# Patient Record
Sex: Female | Born: 1935 | Race: White | Hispanic: No | Smoking: Never smoker
Health system: Southern US, Community
[De-identification: ages and names within clinical notes are randomized; demographics above are authoritative.]

## PROBLEM LIST (undated history)

## (undated) DIAGNOSIS — N179 Acute kidney failure, unspecified: Secondary | ICD-10-CM

## (undated) DIAGNOSIS — K649 Unspecified hemorrhoids: Secondary | ICD-10-CM

## (undated) DIAGNOSIS — N951 Menopausal and female climacteric states: Secondary | ICD-10-CM

## (undated) DIAGNOSIS — H3530 Unspecified macular degeneration: Secondary | ICD-10-CM

## (undated) DIAGNOSIS — E039 Hypothyroidism, unspecified: Secondary | ICD-10-CM

## (undated) DIAGNOSIS — I509 Heart failure, unspecified: Secondary | ICD-10-CM

## (undated) DIAGNOSIS — H35039 Hypertensive retinopathy, unspecified eye: Secondary | ICD-10-CM

## (undated) DIAGNOSIS — Z86010 Personal history of colonic polyps: Secondary | ICD-10-CM

## (undated) DIAGNOSIS — I10 Essential (primary) hypertension: Secondary | ICD-10-CM

## (undated) DIAGNOSIS — I4891 Unspecified atrial fibrillation: Secondary | ICD-10-CM

## (undated) DIAGNOSIS — Z9189 Other specified personal risk factors, not elsewhere classified: Secondary | ICD-10-CM

## (undated) DIAGNOSIS — I999 Unspecified disorder of circulatory system: Secondary | ICD-10-CM

## (undated) HISTORY — PX: ABDOMINAL HYSTERECTOMY: SHX81

## (undated) HISTORY — DX: Essential (primary) hypertension: I10

## (undated) HISTORY — PX: HEMORRHOID BANDING: SHX5850

## (undated) HISTORY — DX: Menopausal and female climacteric states: N95.1

## (undated) HISTORY — DX: Hypertensive retinopathy, unspecified eye: H35.039

## (undated) HISTORY — DX: Unspecified atrial fibrillation: I48.91

## (undated) HISTORY — DX: Unspecified macular degeneration: H35.30

## (undated) HISTORY — PX: TONSILLECTOMY: SHX5217

## (undated) HISTORY — PX: EYE SURGERY: SHX253

## (undated) HISTORY — DX: Unspecified hemorrhoids: K64.9

## (undated) HISTORY — DX: Other specified personal risk factors, not elsewhere classified: Z91.89

## (undated) HISTORY — DX: Unspecified disorder of circulatory system: I99.9

## (undated) HISTORY — PX: APPENDECTOMY: SHX54

## (undated) HISTORY — PX: CATARACT EXTRACTION: SUR2

## (undated) HISTORY — DX: Hypothyroidism, unspecified: E03.9

## (undated) HISTORY — PX: BREAST SURGERY: SHX581

## (undated) HISTORY — DX: Acute kidney failure, unspecified: N17.9

## (undated) HISTORY — DX: Personal history of colonic polyps: Z86.010

---

## 1985-07-14 HISTORY — PX: BREAST EXCISIONAL BIOPSY: SUR124

## 1999-05-30 ENCOUNTER — Encounter: Payer: Self-pay | Admitting: Internal Medicine

## 1999-05-30 ENCOUNTER — Encounter: Admission: RE | Admit: 1999-05-30 | Discharge: 1999-05-30 | Payer: Self-pay | Admitting: Internal Medicine

## 2000-06-02 ENCOUNTER — Encounter: Admission: RE | Admit: 2000-06-02 | Discharge: 2000-06-02 | Payer: Self-pay | Admitting: Internal Medicine

## 2000-06-02 ENCOUNTER — Encounter: Payer: Self-pay | Admitting: Internal Medicine

## 2000-06-16 ENCOUNTER — Encounter: Payer: Self-pay | Admitting: Internal Medicine

## 2000-06-16 ENCOUNTER — Encounter: Admission: RE | Admit: 2000-06-16 | Discharge: 2000-06-16 | Payer: Self-pay | Admitting: Internal Medicine

## 2001-02-01 ENCOUNTER — Encounter: Admission: RE | Admit: 2001-02-01 | Discharge: 2001-02-01 | Payer: Self-pay | Admitting: Orthopedic Surgery

## 2001-02-01 ENCOUNTER — Encounter: Payer: Self-pay | Admitting: Orthopedic Surgery

## 2001-02-26 ENCOUNTER — Encounter: Admission: RE | Admit: 2001-02-26 | Discharge: 2001-03-25 | Payer: Self-pay | Admitting: Orthopedic Surgery

## 2001-06-04 ENCOUNTER — Encounter: Payer: Self-pay | Admitting: Internal Medicine

## 2001-06-04 ENCOUNTER — Encounter: Admission: RE | Admit: 2001-06-04 | Discharge: 2001-06-04 | Payer: Self-pay | Admitting: Internal Medicine

## 2002-06-08 ENCOUNTER — Encounter: Admission: RE | Admit: 2002-06-08 | Discharge: 2002-06-08 | Payer: Self-pay | Admitting: Internal Medicine

## 2002-06-08 ENCOUNTER — Encounter: Payer: Self-pay | Admitting: Internal Medicine

## 2003-06-12 ENCOUNTER — Encounter: Admission: RE | Admit: 2003-06-12 | Discharge: 2003-06-12 | Payer: Self-pay | Admitting: Internal Medicine

## 2003-06-15 ENCOUNTER — Encounter: Admission: RE | Admit: 2003-06-15 | Discharge: 2003-06-15 | Payer: Self-pay | Admitting: Internal Medicine

## 2004-05-28 ENCOUNTER — Ambulatory Visit: Payer: Self-pay | Admitting: Internal Medicine

## 2004-06-18 ENCOUNTER — Encounter: Admission: RE | Admit: 2004-06-18 | Discharge: 2004-06-18 | Payer: Self-pay | Admitting: Internal Medicine

## 2004-06-26 ENCOUNTER — Encounter: Admission: RE | Admit: 2004-06-26 | Discharge: 2004-06-26 | Payer: Self-pay | Admitting: Internal Medicine

## 2004-07-23 ENCOUNTER — Ambulatory Visit: Payer: Self-pay | Admitting: Internal Medicine

## 2004-11-20 ENCOUNTER — Ambulatory Visit: Payer: Self-pay | Admitting: Internal Medicine

## 2004-12-15 ENCOUNTER — Emergency Department (HOSPITAL_COMMUNITY): Admission: EM | Admit: 2004-12-15 | Discharge: 2004-12-15 | Payer: Self-pay | Admitting: Emergency Medicine

## 2005-04-01 ENCOUNTER — Ambulatory Visit: Payer: Self-pay | Admitting: Internal Medicine

## 2005-05-21 ENCOUNTER — Ambulatory Visit: Payer: Self-pay | Admitting: Internal Medicine

## 2005-07-11 ENCOUNTER — Encounter: Admission: RE | Admit: 2005-07-11 | Discharge: 2005-07-11 | Payer: Self-pay | Admitting: Internal Medicine

## 2005-09-29 ENCOUNTER — Encounter: Admission: RE | Admit: 2005-09-29 | Discharge: 2005-09-29 | Payer: Self-pay | Admitting: Orthopedic Surgery

## 2005-12-09 ENCOUNTER — Ambulatory Visit: Payer: Self-pay | Admitting: Internal Medicine

## 2006-04-09 ENCOUNTER — Ambulatory Visit: Payer: Self-pay | Admitting: Internal Medicine

## 2006-05-14 ENCOUNTER — Emergency Department (HOSPITAL_COMMUNITY): Admission: EM | Admit: 2006-05-14 | Discharge: 2006-05-14 | Payer: Self-pay | Admitting: Emergency Medicine

## 2006-08-24 ENCOUNTER — Ambulatory Visit: Payer: Self-pay | Admitting: Internal Medicine

## 2006-08-27 ENCOUNTER — Encounter: Admission: RE | Admit: 2006-08-27 | Discharge: 2006-08-27 | Payer: Self-pay | Admitting: Internal Medicine

## 2006-10-05 ENCOUNTER — Ambulatory Visit: Payer: Self-pay | Admitting: Internal Medicine

## 2006-11-27 ENCOUNTER — Encounter: Payer: Self-pay | Admitting: Internal Medicine

## 2006-11-27 DIAGNOSIS — Z860100 Personal history of colon polyps, unspecified: Secondary | ICD-10-CM

## 2006-11-27 DIAGNOSIS — I10 Essential (primary) hypertension: Secondary | ICD-10-CM | POA: Insufficient documentation

## 2006-11-27 DIAGNOSIS — N951 Menopausal and female climacteric states: Secondary | ICD-10-CM | POA: Insufficient documentation

## 2006-11-27 DIAGNOSIS — E039 Hypothyroidism, unspecified: Secondary | ICD-10-CM

## 2006-11-27 DIAGNOSIS — Z9189 Other specified personal risk factors, not elsewhere classified: Secondary | ICD-10-CM | POA: Insufficient documentation

## 2006-11-27 HISTORY — DX: Personal history of colonic polyps: Z86.010

## 2006-11-27 HISTORY — DX: Personal history of colon polyps, unspecified: Z86.0100

## 2006-11-27 HISTORY — DX: Hypothyroidism, unspecified: E03.9

## 2006-11-27 HISTORY — DX: Menopausal and female climacteric states: N95.1

## 2006-11-27 HISTORY — DX: Other specified personal risk factors, not elsewhere classified: Z91.89

## 2006-11-27 HISTORY — DX: Essential (primary) hypertension: I10

## 2007-01-05 ENCOUNTER — Ambulatory Visit: Payer: Self-pay | Admitting: Internal Medicine

## 2007-03-18 ENCOUNTER — Telehealth: Payer: Self-pay | Admitting: Internal Medicine

## 2007-04-14 ENCOUNTER — Ambulatory Visit: Payer: Self-pay | Admitting: Internal Medicine

## 2007-04-21 ENCOUNTER — Telehealth: Payer: Self-pay | Admitting: Internal Medicine

## 2007-08-16 ENCOUNTER — Ambulatory Visit: Payer: Self-pay | Admitting: Internal Medicine

## 2007-08-30 ENCOUNTER — Encounter: Admission: RE | Admit: 2007-08-30 | Discharge: 2007-08-30 | Payer: Self-pay | Admitting: Internal Medicine

## 2007-09-07 ENCOUNTER — Encounter: Admission: RE | Admit: 2007-09-07 | Discharge: 2007-09-07 | Payer: Self-pay | Admitting: Internal Medicine

## 2007-12-14 ENCOUNTER — Ambulatory Visit: Payer: Self-pay | Admitting: Internal Medicine

## 2007-12-24 ENCOUNTER — Ambulatory Visit: Payer: Self-pay | Admitting: Vascular Surgery

## 2008-04-11 ENCOUNTER — Emergency Department (HOSPITAL_COMMUNITY): Admission: EM | Admit: 2008-04-11 | Discharge: 2008-04-11 | Payer: Self-pay | Admitting: Emergency Medicine

## 2008-04-20 ENCOUNTER — Ambulatory Visit: Payer: Self-pay | Admitting: Internal Medicine

## 2008-04-20 DIAGNOSIS — R071 Chest pain on breathing: Secondary | ICD-10-CM | POA: Insufficient documentation

## 2008-05-31 ENCOUNTER — Ambulatory Visit: Payer: Self-pay | Admitting: Internal Medicine

## 2008-07-17 ENCOUNTER — Ambulatory Visit: Payer: Self-pay | Admitting: Internal Medicine

## 2008-07-17 DIAGNOSIS — J069 Acute upper respiratory infection, unspecified: Secondary | ICD-10-CM | POA: Insufficient documentation

## 2008-07-25 ENCOUNTER — Ambulatory Visit: Payer: Self-pay | Admitting: Internal Medicine

## 2008-08-31 ENCOUNTER — Encounter: Admission: RE | Admit: 2008-08-31 | Discharge: 2008-08-31 | Payer: Self-pay | Admitting: Internal Medicine

## 2008-11-28 ENCOUNTER — Ambulatory Visit: Payer: Self-pay | Admitting: Internal Medicine

## 2009-06-04 ENCOUNTER — Ambulatory Visit: Payer: Self-pay | Admitting: Internal Medicine

## 2009-07-26 ENCOUNTER — Ambulatory Visit: Payer: Self-pay | Admitting: Internal Medicine

## 2009-08-06 ENCOUNTER — Ambulatory Visit: Payer: Self-pay | Admitting: Internal Medicine

## 2009-08-27 LAB — HM MAMMOGRAPHY: HM Mammogram: NEGATIVE

## 2009-09-06 ENCOUNTER — Encounter: Admission: RE | Admit: 2009-09-06 | Discharge: 2009-09-06 | Payer: Self-pay | Admitting: Internal Medicine

## 2009-10-22 ENCOUNTER — Encounter (INDEPENDENT_AMBULATORY_CARE_PROVIDER_SITE_OTHER): Payer: Self-pay | Admitting: *Deleted
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
## 2009-12-03 ENCOUNTER — Ambulatory Visit: Payer: Self-pay | Admitting: Internal Medicine

## 2009-12-03 ENCOUNTER — Telehealth: Payer: Self-pay | Admitting: Internal Medicine

## 2009-12-06 ENCOUNTER — Telehealth: Payer: Self-pay

## 2009-12-06 ENCOUNTER — Encounter: Payer: Self-pay | Admitting: Internal Medicine

## 2010-01-17 ENCOUNTER — Telehealth: Payer: Self-pay | Admitting: Internal Medicine

## 2010-01-17 DIAGNOSIS — K649 Unspecified hemorrhoids: Secondary | ICD-10-CM

## 2010-01-17 DIAGNOSIS — K648 Other hemorrhoids: Secondary | ICD-10-CM | POA: Insufficient documentation

## 2010-01-17 HISTORY — DX: Unspecified hemorrhoids: K64.9

## 2010-05-24 ENCOUNTER — Ambulatory Visit: Payer: Self-pay | Admitting: Family Medicine

## 2010-05-24 DIAGNOSIS — J019 Acute sinusitis, unspecified: Secondary | ICD-10-CM | POA: Insufficient documentation

## 2010-06-12 ENCOUNTER — Ambulatory Visit: Payer: Self-pay | Admitting: Internal Medicine

## 2010-06-12 ENCOUNTER — Encounter: Payer: Self-pay | Admitting: Internal Medicine

## 2010-06-12 LAB — CONVERTED CEMR LAB
Alkaline Phosphatase: 71 units/L (ref 39–117)
BUN: 25 mg/dL — ABNORMAL HIGH (ref 6–23)
Basophils Absolute: 0 10*3/uL (ref 0.0–0.1)
Basophils Relative: 0.7 % (ref 0.0–3.0)
Calcium: 11 mg/dL — ABNORMAL HIGH (ref 8.4–10.5)
Cholesterol: 212 mg/dL — ABNORMAL HIGH (ref 0–200)
Creatinine, Ser: 0.8 mg/dL (ref 0.4–1.2)
Direct LDL: 94.6 mg/dL
Glucose, Bld: 91 mg/dL (ref 70–99)
HCT: 39.8 % (ref 36.0–46.0)
Hemoglobin: 13.7 g/dL (ref 12.0–15.0)
Lymphs Abs: 1.2 10*3/uL (ref 0.7–4.0)
MCHC: 34.5 g/dL (ref 30.0–36.0)
MCV: 94.2 fL (ref 78.0–100.0)
Monocytes Absolute: 0.4 10*3/uL (ref 0.1–1.0)
Platelets: 157 10*3/uL (ref 150.0–400.0)
TSH: 1.84 microintl units/mL (ref 0.35–5.50)
Triglycerides: 67 mg/dL (ref 0.0–149.0)
WBC: 3.9 10*3/uL — ABNORMAL LOW (ref 4.5–10.5)

## 2010-06-13 ENCOUNTER — Telehealth: Payer: Self-pay | Admitting: Internal Medicine

## 2010-07-03 ENCOUNTER — Ambulatory Visit: Payer: Self-pay | Admitting: Internal Medicine

## 2010-07-23 ENCOUNTER — Telehealth: Payer: Self-pay | Admitting: Internal Medicine

## 2010-08-03 ENCOUNTER — Encounter: Payer: Self-pay | Admitting: Internal Medicine

## 2010-08-08 ENCOUNTER — Other Ambulatory Visit: Payer: Self-pay | Admitting: Internal Medicine

## 2010-08-08 DIAGNOSIS — Z1239 Encounter for other screening for malignant neoplasm of breast: Secondary | ICD-10-CM

## 2010-08-11 LAB — CONVERTED CEMR LAB
AST: 14 units/L (ref 0–37)
AST: 15 units/L (ref 0–37)
AST: 15 units/L (ref 0–37)
Albumin: 4.1 g/dL (ref 3.5–5.2)
Albumin: 4.2 g/dL (ref 3.5–5.2)
Albumin: 4.4 g/dL (ref 3.5–5.2)
Alkaline Phosphatase: 72 units/L (ref 39–117)
Alkaline Phosphatase: 72 units/L (ref 39–117)
Alkaline Phosphatase: 82 units/L (ref 39–117)
BUN: 20 mg/dL (ref 6–23)
BUN: 23 mg/dL (ref 6–23)
Basophils Absolute: 0 10*3/uL (ref 0.0–0.1)
Basophils Absolute: 0 10*3/uL (ref 0.0–0.1)
Basophils Relative: 0.5 % (ref 0.0–3.0)
Bilirubin, Direct: 0.1 mg/dL (ref 0.0–0.3)
Bilirubin, Direct: 0.1 mg/dL (ref 0.0–0.3)
CO2: 30 meq/L (ref 19–32)
Calcium: 10.9 mg/dL — ABNORMAL HIGH (ref 8.4–10.5)
Calcium: 11 mg/dL — ABNORMAL HIGH (ref 8.4–10.5)
Chloride: 103 meq/L (ref 96–112)
Chloride: 109 meq/L (ref 96–112)
Cholesterol: 206 mg/dL — ABNORMAL HIGH (ref 0–200)
Cholesterol: 210 mg/dL (ref 0–200)
Creatinine, Ser: 0.7 mg/dL (ref 0.4–1.2)
Eosinophils Relative: 1.8 % (ref 0.0–5.0)
Eosinophils Relative: 2.2 % (ref 0.0–5.0)
GFR calc Af Amer: 104 mL/min
GFR calc Af Amer: 104 mL/min
GFR calc non Af Amer: 86 mL/min
Glucose, Bld: 103 mg/dL — ABNORMAL HIGH (ref 70–99)
Glucose, Bld: 95 mg/dL (ref 70–99)
HCT: 40.5 % (ref 36.0–46.0)
HCT: 41.1 % (ref 36.0–46.0)
Hemoglobin: 13.6 g/dL (ref 12.0–15.0)
Lymphocytes Relative: 27.3 % (ref 12.0–46.0)
Lymphocytes Relative: 29.3 % (ref 12.0–46.0)
MCHC: 34 g/dL (ref 30.0–36.0)
MCV: 94.6 fL (ref 78.0–100.0)
Monocytes Absolute: 0.4 10*3/uL (ref 0.2–0.7)
Monocytes Relative: 8.7 % (ref 3.0–12.0)
Monocytes Relative: 9.9 % (ref 3.0–11.0)
Neutro Abs: 2.4 10*3/uL (ref 1.4–7.7)
Neutro Abs: 2.7 10*3/uL (ref 1.4–7.7)
Neutro Abs: 2.8 10*3/uL (ref 1.4–7.7)
Neutrophils Relative %: 59.7 % (ref 43.0–77.0)
Platelets: 156 10*3/uL (ref 150–400)
Potassium: 3.2 meq/L — ABNORMAL LOW (ref 3.5–5.1)
RDW: 12.3 % (ref 11.5–14.6)
Sodium: 143 meq/L (ref 135–145)
Sodium: 146 meq/L — ABNORMAL HIGH (ref 135–145)
Total Bilirubin: 0.9 mg/dL (ref 0.3–1.2)
Total Bilirubin: 1 mg/dL (ref 0.3–1.2)
Total CHOL/HDL Ratio: 1.8
Total Protein: 6.7 g/dL (ref 6.0–8.3)
Total Protein: 6.8 g/dL (ref 6.0–8.3)
VLDL: 11 mg/dL (ref 0–40)
WBC: 4 10*3/uL — ABNORMAL LOW (ref 4.5–10.5)
WBC: 4.5 10*3/uL (ref 4.5–10.5)

## 2010-08-13 NOTE — Progress Notes (Signed)
Summary: Klor Con 20 tabs  Phone Note From Pharmacy   Caller: Pharmacy  Summary of Call: Pharmacy faxed a request to clarify Klor Con, not pack, tablet Initial call taken by: Sid Falcon LPN,  June 13, 2010 4:25 PM    New/Updated Medications: KLOR-CON M20 20 MEQ CR-TABS (POTASSIUM CHLORIDE CRYS CR) once daily

## 2010-08-13 NOTE — Assessment & Plan Note (Signed)
Summary: COUGH/CONGESTION/CJR   Vital Signs:  Patient profile:   77 year old female Height:      60.75 inches (154.31 cm) Weight:      147 pounds (66.82 kg) BMI:     28.11 O2 Sat:      98 % on Room air Temp:     98.0 degrees F (36.67 degrees C) oral Pulse rate:   80 / minute BP sitting:   122 / 68  (left arm) Cuff size:   regular  Vitals Entered By: [name] RMA (May 24, 2010 10:58 AM)  O2 Flow:  Room air CC: cough/congestion X8 days- cough w/phlegm (green-started today), chest congestion, head feels "full"/ CF Is Patient Diabetic? No   History of Present Illness: 77 y/o WF who presents complaining of upper respiratory symptoms for 8-9  days.  [place] nasal congestion/runny nose, PND cough--thick, green mucous last couple of days.  No fevers, no wheezing or SOB.  Face hurting around paranasal sinuses.  No teeth pain.  ST mild at most. Symptoms not improving any.   Preventive Screening-Counseling & Management  Alcohol-Tobacco     Smoking Status: never  Current Medications (verified): 1)  Hydrochlorothiazide 25 Mg Tabs (Hydrochlorothiazide) .... Take 1 Tablet By Mouth Once A Day Fill [facility] 2)  Synthroid 75 Mcg Tabs (Levothyroxine Sodium) .Marland Kitchen.. 1 Once Daily Fill [facility] 3)  Multivitamins   Caps (Multiple Vitamin) .... Once Daily 4)  Calcium 500 500 Mg  Tabs (Calcium Carbonate) .... Once Daily 5)  Amlodipine Besylate 10 Mg  Tabs (Amlodipine Besylate) .Marland Kitchen.. 1 Once Daily Fill [facility] 6)  Klor-Con 20 Meq Pack (Potassium Chloride) .Marland Kitchen.. 1 [facility] 7)  Zithromax Z-Pak 250 Mg Tabs (Azithromycin) .... As Directed 8)  Prednisone 20 Mg Tabs (Prednisone) .... 2 Tabs By Mouth Once Daily X 5d  Allergies (verified): 1)  Codeine Phosphate (Codeine Phosphate)  Past History:  Past medical, surgical, family and social histories (including risk factors) reviewed, and no changes noted (except as noted below).  Past Medical History: Reviewed history from 05/31/2008 and no  changes required. Colonic polyps, hx of Hypertension Hypothyroidism external hemorrhoids  Past Surgical History: Reviewed history from 05/31/2008 and no changes required. Appendectomy 1981 Hysterectomy 1981 Tonsillectomy gravida 3, para 3, abortus zero history breast biopsy colonoscopy September 2004  Review of Systems       see HPI  Physical Exam  General:  Gen: alert, NAD, NONTOXIC. HEENT: eyes without injection, drainage, or swelling.  Ears: EACs clear, TMs with normal light reflex and landmarks.  Nose: some dried, crusty exudate adherent to some mildly injected mucosa.  No purulent d/c.  TTP in ethmoid sinus areas bilaterally.  No facial swelling.  Throat and mouth without focal lesion.  No pharyngial swelling, erythema, or exudate.   Neck: supple, no LAD.  LUNGS: CTA bilat, nonlabored resps.  CV: RRR, no m/r/g.     Impression & Recommendations:  Problem # 1:  BRONCHITIS- ACUTE (ICD-466.0) Assessment New No sign of any significant RAD component. Take z-pack and steroids as prescribed.  Problem # 2:  SINUSITIS - ACUTE-NOS (ICD-461.9) Assessment: New Take z-pack as prescribed. Continue frequent nasal saline irrigation. She will return for flu vaccine when feeling better.  Complete Medication List: 1)  Hydrochlorothiazide 25 Mg Tabs (Hydrochlorothiazide) .... Take 1 tablet by mouth once a day fill [facility] 2)  Synthroid 75 Mcg Tabs (Levothyroxine sodium) .Marland Kitchen.. 1 once daily fill [facility] 3)  Multivitamins Caps (Multiple vitamin) .... Once daily 4)  Calcium 500 500 Mg Tabs (Calcium carbonate) .... Once daily 5)  Amlodipine Besylate 10 Mg Tabs (Amlodipine besylate) .Marland Kitchen.. 1 once daily fill [facility] 6)  Klor-con 20 Meq Pack (Potassium chloride) .Marland Kitchen.. 1 [facility] 7)  Zithromax Z-pak 250 Mg Tabs (Azithromycin) .... As directed 8)  Prednisone 20 Mg Tabs (Prednisone) .... 2 tabs by mouth once daily x 5d  Patient Instructions: 1)  Please schedule a follow-up appointment as  needed .  2)  Continue nasal saline irrigation and mucinex DM. 3)  Call or return if not much improved in 5d. Prescriptions: PREDNISONE 20 MG TABS (PREDNISONE) 2 tabs by mouth once daily x 5d  #10 x 0   Entered and Authorized by:   Michell Heinrich M.D.   Signed by:   Michell Heinrich M.D. on 05/24/2010   Method used:   Electronically to        [id number]       [address]       [place], [place]  [address]       Ph: [phone] or [phone]       Fax: [phone]   RxID:   [id number] ZITHROMAX Z-PAK 250 MG TABS (AZITHROMYCIN) as directed  #1 pack x 0   Entered and Authorized by:   Michell Heinrich M.D.   Signed by:   Michell Heinrich M.D. on 05/24/2010   Method used:   Electronically to        [id number]       [address]       [place], [place]  [address]       Ph: [phone] or [phone]       Fax: [phone]   RxID:   [id number]    Orders Added: 1)  Est. Patient Level III [[address]]

## 2010-08-13 NOTE — Assessment & Plan Note (Signed)
Summary: CHEST CONGESTION/THROAT IRRITATION/CJR   Vital Signs:  Patient profile:   77 year old female Weight:      149 pounds Temp:     99.0 degrees F oral BP sitting:   150 / 80  (left arm) Cuff size:   regular  Vitals Entered By: Raechel Ache, RN (August 06, 2009 3:29 PM) CC: C/o raspy throat and dry cough   CC:  C/o raspy throat and dry cough.  History of Present Illness: 77 year old patient who presents today with ongoing cough, congestion.  She was on amoxicillin about two weeks ago for a dental indication and over the past two days has had worsening cough and chest congestion.  She has felt unwell, but no clear fever or chills.  Denies any chest pain or shortness of breath.  Cough is largely nonproductive.  She also had mild sore throat.  She has treated hypertension and hypothyroidism  Allergies: 1)  Codeine Phosphate (Codeine Phosphate)  Past History:  Past Medical History: Reviewed history from 05/31/2008 and no changes required. Colonic polyps, hx of Hypertension Hypothyroidism external hemorrhoids  Review of Systems       The patient complains of anorexia and prolonged cough.  The patient denies fever, weight loss, weight gain, vision loss, decreased hearing, hoarseness, chest pain, syncope, dyspnea on exertion, peripheral edema, headaches, hemoptysis, abdominal pain, melena, hematochezia, severe indigestion/heartburn, hematuria, incontinence, genital sores, muscle weakness, suspicious skin lesions, transient blindness, difficulty walking, depression, unusual weight change, abnormal bleeding, enlarged lymph nodes, angioedema, and breast masses.    Physical Exam  General:  Well-developed,well-nourished,in no acute distress; alert,appropriate and cooperative throughout examination Head:  Normocephalic and atraumatic without obvious abnormalities. No apparent alopecia or balding. Eyes:  No corneal or conjunctival inflammation noted. EOMI. Perrla. Funduscopic exam  benign, without hemorrhages, exudates or papilledema. Vision grossly normal. Mouth:  Oral mucosa and oropharynx without lesions or exudates.  Teeth in good repair. Neck:  No deformities, masses, or tenderness noted. Lungs:  Normal respiratory effort, chest expands symmetrically. Lungs are clear to auscultation, no crackles or wheezes. O2 saturation 95% Heart:  Normal rate and regular rhythm. S1 and S2 normal without gallop, murmur, click, rub or other extra sounds. Abdomen:  Bowel sounds positive,abdomen soft and non-tender without masses, organomegaly or hernias noted. Msk:  No deformity or scoliosis noted of thoracic or lumbar spine.     Impression & Recommendations:  Problem # 1:  URI (ICD-465.9)  Orders: Prescription Created Electronically [phone])  Problem # 2:  HYPERTENSION (ICD-401.9)  Her updated medication list for this problem includes:    Hydrochlorothiazide 25 Mg Tabs (Hydrochlorothiazide) .Marland Kitchen... Take 1 tablet by mouth once a day    Amlodipine Besylate 10 Mg Tabs (Amlodipine besylate) .Marland Kitchen... 1 once daily  Her updated medication list for this problem includes:    Hydrochlorothiazide 25 Mg Tabs (Hydrochlorothiazide) .Marland Kitchen... Take 1 tablet by mouth once a day    Amlodipine Besylate 10 Mg Tabs (Amlodipine besylate) .Marland Kitchen... 1 once daily  Complete Medication List: 1)  Hydrochlorothiazide 25 Mg Tabs (Hydrochlorothiazide) .... Take 1 tablet by mouth once a day 2)  Synthroid 75 Mcg Tabs (Levothyroxine sodium) .Marland Kitchen.. 1 once daily 3)  Multivitamins Caps (Multiple vitamin) .... Once daily 4)  Calcium 500 500 Mg Tabs (Calcium carbonate) .... Once daily 5)  Amlodipine Besylate 10 Mg Tabs (Amlodipine besylate) .Marland Kitchen.. 1 once daily 6)  Klor-con 20 Meq Pack (Potassium chloride) .Marland Kitchen.. 1 once daily 7)  Doxycycline Hyclate 100 Mg Caps (Doxycycline hyclate) .... One  twice daily  Patient Instructions: 1)  Acute bronchitis symptoms for less than 10 days are not helped by antibiotics. take over the  counter cough medications. call if no improvment in  5-7 days, sooner if increasing cough, fever, or new symptoms( shortness of breath, chest pain). Prescriptions: DOXYCYCLINE HYCLATE 100 MG CAPS (DOXYCYCLINE HYCLATE) one twice daily  #14 x 0   Entered and Authorized by:   Gordy Savers  MD   Signed by:   Gordy Savers  MD on 08/06/2009   Method used:   Printed then faxed to ...       [id number]       [address]       [place], [place]  [address]       Ph: [phone] or [phone]       Fax: [phone]   RxID:   [id number] DOXYCYCLINE HYCLATE 100 MG CAPS (DOXYCYCLINE HYCLATE) one twice daily  #14 x 0   Entered and Authorized by:   Gordy Savers  MD   Signed by:   Gordy Savers  MD on 08/06/2009   Method used:   Electronically to        [id number]       [address]       [place], [place]  [address]       Ph: [phone] or [phone]       Fax: [phone]   RxID:   [id number]

## 2010-08-13 NOTE — Assessment & Plan Note (Signed)
Summary: 6 MNTH ROV//SLM   Vital Signs:  Patient profile:   77 year old female Height:      60.75 inches Weight:      149 pounds Temp:     98.2 degrees F oral BP sitting:   122 / 80  (left arm) Cuff size:   regular  Vitals Entered By: Duard Brady LPN (Dec 03, 2009 1:00 PM) CC: 6 mos rov - doing well Is Patient Diabetic? No   CC:  6 mos rov - doing well.  History of Present Illness: 77  year old patient who is seen today for follow-up.  She has been seen by general surgery for external hemorrhoids, and this is her chief complaint.  She has treated hypertension, and a history of colonic polyps.  She is contemplating a repeat and final colonoscopy.  She has hypertension and hypothyroidism  Preventive Screening-Counseling & Management  Alcohol-Tobacco     Smoking Status: never  Allergies: 1)  Codeine Phosphate (Codeine Phosphate)  Past History:  Past Medical History: Reviewed history from 05/31/2008 and no changes required. Colonic polyps, hx of Hypertension Hypothyroidism external hemorrhoids  Past Surgical History: Reviewed history from 05/31/2008 and no changes required. Appendectomy 1981 Hysterectomy 1981 Tonsillectomy gravida 3, para 3, abortus zero history breast biopsy colonoscopy September 2004  Family History: Reviewed history from 05/31/2008 and no changes required. both her parents died at 8, father from coronary artery disease,  mother from uterine cancer.    No siblings  Social History: Reviewed history from 11/28/2008 and no changes required. Married Never Smoked husband  status post CABG in 2010-history of dementia  Review of Systems       The patient complains of hematochezia.  The patient denies anorexia, fever, weight loss, weight gain, vision loss, decreased hearing, hoarseness, chest pain, syncope, dyspnea on exertion, peripheral edema, prolonged cough, headaches, hemoptysis, abdominal pain, melena, severe indigestion/heartburn,  hematuria, incontinence, genital sores, muscle weakness, suspicious skin lesions, transient blindness, difficulty walking, depression, unusual weight change, abnormal bleeding, enlarged lymph nodes, angioedema, and breast masses.    Physical Exam  General:  Well-developed,well-nourished,in no acute distress; alert,appropriate and cooperative throughout examination Head:  Normocephalic and atraumatic without obvious abnormalities. No apparent alopecia or balding. Eyes:  No corneal or conjunctival inflammation noted. EOMI. Perrla. Funduscopic exam benign, without hemorrhages, exudates or papilledema. Vision grossly normal. Mouth:  Oral mucosa and oropharynx without lesions or exudates.  Teeth in good repair. Neck:  No deformities, masses, or tenderness noted. Lungs:  Normal respiratory effort, chest expands symmetrically. Lungs are clear to auscultation, no crackles or wheezes. Heart:  Normal rate and regular rhythm. S1 and S2 normal without gallop, murmur, click, rub or other extra sounds. Abdomen:  Bowel sounds positive,abdomen soft and non-tender without masses, organomegaly or hernias noted. Msk:  No deformity or scoliosis noted of thoracic or lumbar spine.   Pulses:  R and L carotid,radial,femoral,dorsalis pedis and posterior tibial pulses are full and equal bilaterally Extremities:  No clubbing, cyanosis, edema, or deformity noted with normal full range of motion of all joints.     Impression & Recommendations:  Problem # 1:  HYPOTHYROIDISM (ICD-244.9)  Her updated medication list for this problem includes:    Synthroid 75 Mcg Tabs (Levothyroxine sodium) .Marland Kitchen... 1 once daily  Her updated medication list for this problem includes:    Synthroid 75 Mcg Tabs (Levothyroxine sodium) .Marland Kitchen... 1 once daily  Problem # 2:  HYPERTENSION (ICD-401.9)  Her updated medication list for this problem includes:  Hydrochlorothiazide 25 Mg Tabs (Hydrochlorothiazide) .Marland Kitchen... Take 1 tablet by mouth once a  day    Amlodipine Besylate 10 Mg Tabs (Amlodipine besylate) .Marland Kitchen... 1 once daily    Her updated medication list for this problem includes:    Hydrochlorothiazide 25 Mg Tabs (Hydrochlorothiazide) .Marland Kitchen... Take 1 tablet by mouth once a day    Amlodipine Besylate 10 Mg Tabs (Amlodipine besylate) .Marland Kitchen... 1 once daily  Orders: Prescription Created Electronically [phone])  Problem # 3:  COLONIC POLYPS, HX OF (ICD-V12.72) risks versus benefits of colonoscopy discussed  Complete Medication List: 1)  Hydrochlorothiazide 25 Mg Tabs (Hydrochlorothiazide) .... Take 1 tablet by mouth once a day 2)  Synthroid 75 Mcg Tabs (Levothyroxine sodium) .Marland Kitchen.. 1 once daily 3)  Multivitamins Caps (Multiple vitamin) .... Once daily 4)  Calcium 500 500 Mg Tabs (Calcium carbonate) .... Once daily 5)  Amlodipine Besylate 10 Mg Tabs (Amlodipine besylate) .Marland Kitchen.. 1 once daily 6)  Klor-con 20 Meq Pack (Potassium chloride) .Marland Kitchen.. 1 once daily  Patient Instructions: 1)  Please schedule a follow-up appointment in 6 months. 2)  Advised not to eat any food or drink any liquids after 10 PM the night before your procedure. 3)  Limit your Sodium (Salt). 4)  It is important that you exercise regularly at least 20 minutes 5 times a week. If you develop chest pain, have severe difficulty breathing, or feel very tired , stop exercising immediately and seek medical attention. 5)  Take calcium +Vitamin D daily. Prescriptions: KLOR-CON 20 MEQ PACK (POTASSIUM CHLORIDE) 1 once daily  #90 x 6   Entered and Authorized by:   Gordy Savers  MD   Signed by:   Gordy Savers  MD on 12/03/2009   Method used:   Electronically to        [id number]       [address]       [place], [place]  [address]       Ph: [phone] or [phone]       Fax: [phone]   RxID:   [id number] AMLODIPINE BESYLATE 10 MG  TABS (AMLODIPINE BESYLATE) 1 once daily  #90 x 6   Entered and Authorized by:   Gordy Savers  MD    Signed by:   Gordy Savers  MD on 12/03/2009   Method used:   Electronically to        [id number]       [address]       [place], [place]  [address]       Ph: [phone] or [phone]       Fax: [phone]   RxID:   [id number] SYNTHROID 75 MCG TABS (LEVOTHYROXINE SODIUM) 1 once daily  #90 x 6   Entered and Authorized by:   Gordy Savers  MD   Signed by:   Gordy Savers  MD on 12/03/2009   Method used:   Electronically to        [id number]       [address]       [place], [place]  [address]       Ph: [phone] or [phone]       Fax: [phone]   RxID:   [id number] HYDROCHLOROTHIAZIDE 25 MG TABS (HYDROCHLOROTHIAZIDE) Take 1 tablet by mouth once a day  #90 x 6   Entered and Authorized by:   Gordy Savers  MD   Signed by:  Gordy Savers  MD on 12/03/2009   Method used:   Electronically to        [facility]       [address]       [place], [place]  [address]       Ph: [phone] or [phone]       Fax: [phone]   RxID:   [id number]

## 2010-08-13 NOTE — Progress Notes (Signed)
Summary: rx confusion - where to fill  Phone Note Call from Patient Call back at Home Phone [phone]   Caller: Patient---live call Reason for Call: Talk to Nurse Summary of Call: wants Kim to return call about her rxs. Initial call taken by: Warnell Forester,  Dec 06, 2009 9:10 AM  Follow-up for Phone Call        having problems with rx's both at [facility] and [facility]   per pt : [facility]  all other meds to be [facility]   [facility] that [facility]  Follow-up by: Duard Brady LPN,  Dec 06, 2009 11:14 AM  Additional Follow-up for Phone Call Additional follow up Details #1::        [facility] , cx'd all rx's except Potassium  Called [facility] - spoke with Olga Coaster pharmacist - confirmed current Rx's  hctz,synthriod, amlodipine only. Pt needs rf on hctz and synthriod at this time, hold rx for amlodipine until pt request it to be filled.  Additional Follow-up by: Duard Brady LPN,  Dec 06, 2009 11:47 AM    Additional Follow-up for Phone Call Additional follow up Details #2::    spoke with pt - explained who I spoke with and that rx's should be ok at this time. KIK Follow-up by: Duard Brady LPN,  Dec 06, 2009 11:48 AM

## 2010-08-13 NOTE — Progress Notes (Signed)
Summary: hemorrhoids  Phone Note Call from Patient   Caller: Patient Call For: Gordy Savers  MD Reason for Call: Talk to Doctor Summary of Call: rec'd call  from pt  - wanted to speak to Dr. Amador Cunas about her hemorrhoids  that they discussed at last visit. KIK Initial call taken by: Duard Brady LPN,  January 17, 1609 3:31 PM  Follow-up for Phone Call        attempt to call - ans mach - LMTCB - informed Dr. Amador Cunas out of office until Monday - call and let me know what the problem is in case i can help. I reviewed last office visit - cant really figure out what may be the problem. KIK Follow-up by: Duard Brady LPN,  January 18, 9603 3:36 PM  Additional Follow-up for Phone Call Additional follow up Details #1::        OK to refer back to Gen Surgery for hemorrhoids Additional Follow-up by: Gordy Savers  MD,  January 20, 2010 8:16 PM  New Problems: HEMORRHOIDS (ICD-455.6)   Additional Follow-up for Phone Call Additional follow up Details #2::    pt aware ok for referal to gen. surg , info sent to terri. Terri will call when set up with date and time. KIK Follow-up by: Duard Brady LPN,  January 21, 2010 12:00 PM  New Problems: HEMORRHOIDS (ICD-455.6)

## 2010-08-13 NOTE — Letter (Signed)
Summary: Referral - not able to see patient  [facility]  [address] [place], [place] [address]   Phone: [phone]  Fax: [phone]    October 22, 2009   Eleonore Chiquito, M.D. [address] [place], [place] [address]    Re:   ASTER SCREWS DOB:  1927/07/24 MRN:   [id number]    Dear Dr. Amador Cunas:  Thank you for your kind referral of the above patient.  We have attempted to schedule the recommended procedure Screening Colonoscopy but have not been able to schedule because:   X  The patient was not available by phone and/or has not returned our calls.  ___ The patient declined to schedule the procedure at this time.  We appreciate the referral and hope that we will have the opportunity to treat this patient in the future.    Sincerely,    [facility] [phone]

## 2010-08-13 NOTE — Assessment & Plan Note (Signed)
Summary: CONGESTION // RS   Vital Signs:  Patient profile:   77 year old female Weight:      150 pounds Temp:     98.3 degrees F oral BP sitting:   122 / 62  (left arm) Cuff size:   regular  Vitals Entered By: Raechel Ache, RN (July 26, 2009 12:52 PM) CC: C/o cough and some sinus drainage, bloody mucus.   CC:  C/o cough and some sinus drainage and bloody mucus..  History of Present Illness: 77 year old patient who was seen earlier by her dentist for a tooth abscess.  She has some significant sinus congestion and drainage earlier, but this has largely resolved.  Present time.  She has a minimal nonproductive cough.  No fever.  In general, she feels improved. she has treated hypertension, which has been stable.  Allergies: 1)  Codeine Phosphate (Codeine Phosphate)  Past History:  Past Medical History: Reviewed history from 05/31/2008 and no changes required. Colonic polyps, hx of Hypertension Hypothyroidism external hemorrhoids  Physical Exam  General:  Well-developed,well-nourished,in no acute distress; alert,appropriate and cooperative throughout examination Head:  Normocephalic and atraumatic without obvious abnormalities. No apparent alopecia or balding. Ears:  External ear exam shows no significant lesions or deformities.  Otoscopic examination reveals clear canals, tympanic membranes are intact bilaterally without bulging, retraction, inflammation or discharge. Hearing is grossly normal bilaterally. Mouth:  Oral mucosa and oropharynx without lesions or exudates.  Teeth in good repair. Neck:  No deformities, masses, or tenderness noted. Lungs:  Normal respiratory effort, chest expands symmetrically. Lungs are clear to auscultation, no crackles or wheezes. Heart:  Normal rate and regular rhythm. S1 and S2 normal without gallop, murmur, click, rub or other extra sounds. Abdomen:  Bowel sounds positive,abdomen soft and non-tender without masses, organomegaly or hernias  noted. Msk:  No deformity or scoliosis noted of thoracic or lumbar spine.   Pulses:  R and L carotid,radial,femoral,dorsalis pedis and posterior tibial pulses are full and equal bilaterally Extremities:  No clubbing, cyanosis, edema, or deformity noted with normal full range of motion of all joints.     Impression & Recommendations:  Problem # 1:  URI (ICD-465.9)  Problem # 2:  HYPERTENSION (ICD-401.9)  Her updated medication list for this problem includes:    Hydrochlorothiazide 25 Mg Tabs (Hydrochlorothiazide) .Marland Kitchen... Take 1 tablet by mouth once a day    Amlodipine Besylate 10 Mg Tabs (Amlodipine besylate) .Marland Kitchen... 1 once daily  Complete Medication List: 1)  Hydrochlorothiazide 25 Mg Tabs (Hydrochlorothiazide) .... Take 1 tablet by mouth once a day 2)  Synthroid 75 Mcg Tabs (Levothyroxine sodium) .Marland Kitchen.. 1 once daily 3)  Multivitamins Caps (Multiple vitamin) .... Once daily 4)  Calcium 500 500 Mg Tabs (Calcium carbonate) .... Once daily 5)  Amlodipine Besylate 10 Mg Tabs (Amlodipine besylate) .Marland Kitchen.. 1 once daily 6)  Klor-con 20 Meq Pack (Potassium chloride) .Marland Kitchen.. 1 once daily  Patient Instructions: 1)  Get plenty of rest, drink lots of clear liquids, and use Tylenol or Ibuprofen for fever and comfort. Return in 7-10 days if you're not better:sooner if you're feeling worse.

## 2010-08-13 NOTE — Miscellaneous (Signed)
Summary: change to med list  Clinical Lists Changes  Medications: Changed medication from KLOR-CON 20 MEQ PACK (POTASSIUM CHLORIDE) 1 once daily to KLOR-CON 20 MEQ PACK (POTASSIUM CHLORIDE) 1 [facility] Changed medication from HYDROCHLOROTHIAZIDE 25 MG TABS (HYDROCHLOROTHIAZIDE) Take 1 tablet by mouth once a day to HYDROCHLOROTHIAZIDE 25 MG TABS (HYDROCHLOROTHIAZIDE) Take 1 tablet by mouth once a day fill [facility] Changed medication from SYNTHROID 75 MCG TABS (LEVOTHYROXINE SODIUM) 1 once daily to SYNTHROID 75 MCG TABS (LEVOTHYROXINE SODIUM) 1 once daily fill [facility] Changed medication from AMLODIPINE BESYLATE 10 MG  TABS (AMLODIPINE BESYLATE) 1 once daily to AMLODIPINE BESYLATE 10 MG  TABS (AMLODIPINE BESYLATE) 1 once daily fill [facility]    changes made to assist pt in having meds fill with the right pharmacy. phone note from 12/06/09   [facility]

## 2010-08-13 NOTE — Progress Notes (Signed)
Summary: REFILL  Phone Note Refill Request Call back at Home Phone [phone] Message from:  Patient---WALK IN  Refills Requested: Medication #1:  HYDROCHLOROTHIAZIDE 25 MG TABS Take 1 tablet by mouth once a day  Medication #2:  SYNTHROID 75 MCG TABS 1 once daily SEND TO [facility]....Marland KitchenCANCEL ALL [facility]S THAT WERE ELECTRONICALLY DONE TODAY.  Initial call taken by: Warnell Forester,  Dec 03, 2009 1:31 PM    Prescriptions: KLOR-CON 20 MEQ PACK (POTASSIUM CHLORIDE) 1 once daily  #90 x 6   Entered and Authorized by:   Gordy Savers  MD   Signed by:   Gordy Savers  MD on 12/03/2009   Method used:   Electronically to        [facility]* (mail-order)             ,          Ph: [phone]       Fax: [phone]   RxID:   [id number] AMLODIPINE BESYLATE 10 MG  TABS (AMLODIPINE BESYLATE) 1 once daily  #90 x 6   Entered and Authorized by:   Gordy Savers  MD   Signed by:   Gordy Savers  MD on 12/03/2009   Method used:   Electronically to        [facility]* (mail-order)             ,          Ph: [phone]       Fax: [phone]   RxID:   [id number] SYNTHROID 75 MCG TABS (LEVOTHYROXINE SODIUM) 1 once daily  #90 x 6   Entered and Authorized by:   Gordy Savers  MD   Signed by:   Gordy Savers  MD on 12/03/2009   Method used:   Electronically to        [facility]* (mail-order)             ,          Ph: [phone]       Fax: [phone]   RxID:   [id number] HYDROCHLOROTHIAZIDE 25 MG TABS (HYDROCHLOROTHIAZIDE) Take 1 tablet by mouth once a day  #90 x 6   Entered and Authorized by:   Gordy Savers  MD   Signed by:   Gordy Savers  MD on 12/03/2009   Method used:   Electronically to        [facility]* (mail-order)             ,          Ph: [phone]       Fax: [phone]   RxID:   [id number]

## 2010-08-13 NOTE — Assessment & Plan Note (Signed)
Summary: CPX---WILL FAST//CCM   Vital Signs:  Patient profile:   77 year old female Height:      61 inches Weight:      145 pounds BMI:     27.50 Temp:     97.0 degrees F oral BP sitting:   124 / 80  (right arm) Cuff size:   regular  Vitals Entered By: Duard Brady LPN (June 12, 2010 9:23 AM) CC: cpx - doing ok - cough Is Patient Diabetic? No   Primary Care Provider:  Gordy Savers  MD  CC:  cpx - doing ok - cough.  History of Present Illness: 77 year old patient who is seen today for a wellness exam.  Medical problems include hypertension, hypothyroidism, and a history of colonic polyps.  Last colonoscopy was 2004.  She is doing quite well.  Today  Here for Medicare AWV:  1.   Risk factors based on Past M, S, F history:  prevascular risk factors include hypertension, and a family history of coronary artery disease 2.   Physical Activities: remains quite active without limiting factors.  Does have some mild chronic low back pain 3.   Depression/mood: no history of depression or mood disorder 4.   Hearing: no deficits 5.   ADL's: independent in all aspects of daily living 6.   Fall Risk: low 7.   Home Safety: no problems  identified 8.   Height, weight, &visual acuity:height and weight stable.  No change in visual acuity 9.   Counseling: heart healthy diet regular.  Exercise, all encouraged 10.   Labs ordered based on risk factors: laboratory profile, including lipid profile, and TSH will be reviewed 11.           Referral Coordination- needs follow-up colonoscopy 12.           Care Plan- follow-up colonoscopy encouraged 13.            Cognitive Assessment- alert and oriented, with normal affect.  No cognitive dysfunction, or memory impairment.  Handles all executive functioning of household   Allergies: 1)  Codeine Phosphate (Codeine Phosphate)  Past History:  Past Medical History: Reviewed history from 05/31/2008 and no changes required. Colonic  polyps, hx of Hypertension Hypothyroidism external hemorrhoids  Past Surgical History: Reviewed history from 05/31/2008 and no changes required. Appendectomy 1981 Hysterectomy 1981 Tonsillectomy gravida 3, para 3, abortus zero history breast biopsy colonoscopy September 2004  Family History: Reviewed history from 05/31/2008 and no changes required. both her parents died at 9, father from coronary artery disease,  mother from uterine cancer.    No siblings  Social History: Reviewed history from 11/28/2008 and no changes required. Married Never Smoked husband  status post CABG in 2010-history of dementia 3 sons, 5 grandchildren  Review of Systems  The patient denies anorexia, fever, weight loss, weight gain, vision loss, decreased hearing, hoarseness, chest pain, syncope, dyspnea on exertion, peripheral edema, prolonged cough, headaches, hemoptysis, abdominal pain, melena, hematochezia, severe indigestion/heartburn, hematuria, incontinence, genital sores, muscle weakness, suspicious skin lesions, transient blindness, difficulty walking, depression, unusual weight change, abnormal bleeding, enlarged lymph nodes, angioedema, and breast masses.    Physical Exam  General:  Well-developed,well-nourished,in no acute distress; alert,appropriate and cooperative throughout examination Head:  Normocephalic and atraumatic without obvious abnormalities. No apparent alopecia or balding. Eyes:  No corneal or conjunctival inflammation noted. EOMI. Perrla. Funduscopic exam benign, without hemorrhages, exudates or papilledema. Vision grossly normal. Ears:  External ear exam shows no significant lesions or deformities.  Otoscopic examination reveals clear canals, tympanic membranes are intact bilaterally without bulging, retraction, inflammation or discharge. Hearing is grossly normal bilaterally. Nose:  External nasal examination shows no deformity or inflammation. Nasal mucosa are pink and moist  without lesions or exudates. Mouth:  Oral mucosa and oropharynx without lesions or exudates.  Teeth in good repair. Neck:  No deformities, masses, or tenderness noted. Chest Wall:  No deformities, masses, or tenderness noted. Breasts:  No mass, nodules, thickening, tenderness, bulging, retraction, inflamation, nipple discharge or skin changes noted.   Lungs:  Normal respiratory effort, chest expands symmetrically. Lungs are clear to auscultation, no crackles or wheezes. Heart:  Normal rate and regular rhythm. S1 and S2 normal without gallop, murmur, click, rub or other extra sounds. Abdomen:  Bowel sounds positive,abdomen soft and non-tender without masses, organomegaly or hernias noted. Msk:  No deformity or scoliosis noted of thoracic or lumbar spine.   Pulses:  R and L carotid,radial,femoral,dorsalis pedis and posterior tibial pulses are full and equal bilaterally Extremities:  No clubbing, cyanosis, edema, or deformity noted with normal full range of motion of all joints.   Neurologic:  No cranial nerve deficits noted. Station and gait are normal. Plantar reflexes are down-going bilaterally. DTRs are symmetrical throughout. Sensory, motor and coordinative functions appear intact. Skin:  Intact without suspicious lesions or rashes Cervical Nodes:  No lymphadenopathy noted Axillary Nodes:  No palpable lymphadenopathy Inguinal Nodes:  No significant adenopathy Psych:  Cognition and judgment appear intact. Alert and cooperative with normal attention span and concentration. No apparent delusions, illusions, hallucinations   Impression & Recommendations:  Problem # 1:  PREVENTIVE HEALTH CARE (ICD-V70.0)  Orders: Medicare -1st Annual Wellness Visit [phone])  Problem # 2:  HYPOTHYROIDISM (ICD-244.9)  Her updated medication list for this problem includes:    Synthroid 75 Mcg Tabs (Levothyroxine sodium) .Marland Kitchen... 1 once daily [facility]  Her updated medication list for this problem includes:     Synthroid 75 Mcg Tabs (Levothyroxine sodium) .Marland Kitchen... 1 once daily [facility]  Problem # 3:  HYPERTENSION (ICD-401.9)  Her updated medication list for this problem includes:    Hydrochlorothiazide 25 Mg Tabs (Hydrochlorothiazide) .Marland Kitchen... Take 1 tablet by mouth once a day fill [facility]    Amlodipine Besylate 10 Mg Tabs (Amlodipine besylate) .Marland Kitchen... 1 once daily fill [facility]  Orders: EKG w/ Interpretation (93000)  Her updated medication list for this problem includes:    Hydrochlorothiazide 25 Mg Tabs (Hydrochlorothiazide) .Marland Kitchen... Take 1 tablet by mouth once a day fill [facility]    Amlodipine Besylate 10 Mg Tabs (Amlodipine besylate) .Marland Kitchen... 1 once daily fill [facility]  Problem # 4:  COLONIC POLYPS, HX OF (ICD-V12.72)  Orders: TLB-BMP (Basic Metabolic Panel-BMET) (80048-METABOL) TLB-CBC Platelet - w/Differential (85025-CBCD) TLB-Hepatic/Liver Function Pnl (80076-HEPATIC)  Complete Medication List: 1)  Hydrochlorothiazide 25 Mg Tabs (Hydrochlorothiazide) .... Take 1 tablet by mouth once a day [facility] 2)  Synthroid 75 Mcg Tabs (Levothyroxine sodium) .Marland Kitchen.. 1 once daily fill [facility] 3)  Multivitamins Caps (Multiple vitamin) .... Once daily 4)  Calcium 500 500 Mg Tabs (Calcium carbonate) .... Once daily 5)  Amlodipine Besylate 10 Mg Tabs (Amlodipine besylate) .Marland Kitchen.. 1 once daily [facility] 6)  Klor-con 20 Meq Pack (Potassium chloride) .Marland Kitchen.. [facility] 7)  Zithromax Z-pak 250 Mg Tabs (Azithromycin) .... As directed  Other Orders: Venipuncture (09811) TLB-Lipid Panel (80061-LIPID) TLB-TSH (Thyroid Stimulating Hormone) (84443-TSH)  Patient Instructions: 1)  Please schedule a follow-up appointment in 6 months. 2)  Limit  your Sodium (Salt). 3)  It is important that you exercise regularly at least 20 minutes 5 times a week. If you develop chest pain, have severe difficulty breathing, or feel very tired , stop exercising immediately and seek medical attention. 4)  Schedule a colonoscopy/sigmoidoscopy to  help detect colon cancer. 5)  Take calcium +Vitamin D daily. Prescriptions: KLOR-CON 20 MEQ PACK (POTASSIUM CHLORIDE) [facility]  #[facility] x 6   Entered and Authorized by:   Gordy Savers  MD   Signed by:   Gordy Savers  MD on 06/12/2010   Method used:   [facility]       [address]       [place], [place]  [address]       Ph: [phone] or [phone]       Fax: [phone]   RxID:   [id number] AMLODIPINE BESYLATE 10 MG  TABS (AMLODIPINE BESYLATE) 1 once daily [facility]  #90 x 6   Entered and Authorized by:   Gordy Savers  MD   Signed by:   Gordy Savers  MD on 06/12/2010   Method used:   [facility]ically to        [facility]       [address]       [place], [place]  [address]       Ph: [phone] or [phone]       Fax: [phone]   RxID:   [id number] SYNTHROID 75 MCG TABS (LEVOTHYROXINE SODIUM) 1 once daily [facility]  #90 x 6   Entered and Authorized by:   Gordy Savers  MD   Signed by:   Gordy Savers  MD on 06/12/2010   Method used:   [facility]        [facility]       [address]       [place], [place]  [address]       Ph: [phone] or [phone]       Fax: [phone]   RxID:   [id number] HYDROCHLOROTHIAZIDE 25 MG TABS (HYDROCHLOROTHIAZIDE) Take 1 tablet by mouth once a day [facility]  #90 x 6   Entered and Authorized by:   Gordy Savers  MD   Signed by:   Gordy Savers  MD on 06/12/2010   Method used:   [facility]        [facility]       [address]       [place], [place]  [address]       Ph: [phone] or [phone]       Fax: [phone]   RxID:   [id number]    Orders Added: 1)  EKG w/ Interpretation [93000] 2)  Medicare -1st Annual Wellness Visit [G0438] 3)  Est. Patient Level III [24580] 4)  Venipuncture [36415] 5)  TLB-Lipid Panel [80061-LIPID] 6)  TLB-BMP  (Basic Metabolic Panel-BMET) [80048-METABOL] 7)  TLB-CBC Platelet - w/Differential [85025-CBCD] 8)  TLB-Hepatic/Liver Function Pnl [80076-HEPATIC] 9)  TLB-TSH (Thyroid Stimulating Hormone) [99833-ASN]  Appended Document: Orders Update    Clinical Lists Changes  Orders: Added new Service order of Specimen Handling (05397) - Signed

## 2010-08-15 NOTE — Assessment & Plan Note (Signed)
Summary: FLU SHOT/CJR  Nurse Visit   Allergies: 1)  Codeine Phosphate (Codeine Phosphate)  Immunizations Administered:  Influenza Vaccine # 1:    Vaccine Type: Fluvax 3+    Site: right deltoid    Mfr: GlaxoSmithKline    Dose: 0.5 ml    Route: IM    Given by: Duard Brady LPN    Exp. Date: 01/11/2011    Lot #: [id number]    VIS given: 02/05/10 version given July 03, 2010.    Physician counseled: yes  Flu Vaccine Consent Questions:    Do you have a history of severe allergic reactions to this vaccine? no    Any prior history of allergic reactions to egg and/or gelatin? no    Do you have a sensitivity to the preservative Thimersol? no    Do you have a past history of Guillan-Barre Syndrome? no    Do you currently have an acute febrile illness? no    Have you ever had a severe reaction to latex? no    Vaccine information given and explained to patient? yes    Are you currently pregnant? no  Orders Added: 1)  Flu Vaccine 68yrs + [90658] 2)  Admin 1st Vaccine [86578]

## 2010-08-15 NOTE — Progress Notes (Signed)
Summary: [facility]  Phone Note Call from Patient   Caller: Patient Call For: Gordy Savers  MD Reason for Call: Refill Medication Summary of Call: amlodipine refill to [facility]  Initial call taken by: Duard Brady LPN,  July 23, 2010 1:46 PM    Prescriptions: AMLODIPINE BESYLATE 10 MG  TABS (AMLODIPINE BESYLATE) 1 once daily [facility]  #90 x 6   Entered by:   Duard Brady LPN   Authorized by:   Gordy Savers  MD   Signed by:   Duard Brady LPN on 04/54/0981   Method used:   Faxed to ...       [facility] (mail-order)             , [place]         Ph: [phone]       Fax: [phone]   RxID:   [id number]

## 2010-09-09 ENCOUNTER — Ambulatory Visit
Admission: RE | Admit: 2010-09-09 | Discharge: 2010-09-09 | Disposition: A | Discharge status: Home / Self Care | Payer: MEDICARE | Source: Ambulatory Visit | Attending: Internal Medicine | Admitting: Internal Medicine

## 2010-09-09 DIAGNOSIS — Z1239 Encounter for other screening for malignant neoplasm of breast: Secondary | ICD-10-CM

## 2010-10-22 ENCOUNTER — Telehealth: Payer: Self-pay | Admitting: *Deleted

## 2010-10-22 NOTE — Telephone Encounter (Signed)
Confused about refills that she and Janice Small were talking about earlier???

## 2010-10-22 NOTE — Telephone Encounter (Signed)
Called pt - having problems with pharmacy doing automatic refills - told her to call and ask them to remove her/him from that and that she would let them know when she needed refills. kik

## 2010-11-26 NOTE — Procedures (Signed)
LOWER EXTREMITY ARTERIAL EVALUATION-SINGLE LEVEL   INDICATION:  Preop evaluation.  The patient denies claudication and rest  pain.   HISTORY:  Diabetes:  No.  Cardiac:  No.  Hypertension:  Yes.  Smoking:  No.  Previous Surgery:  No.   RESTING SYSTOLIC PRESSURES: (ABI)                          RIGHT                LEFT  Brachial:               150                  150  Anterior tibial:        160                  165  Posterior tibial:       170 (>1.0)           185 (>1.0)  Peroneal:  DOPPLER WAVEFORM ANALYSIS:  Anterior tibial:        Triphasic            Triphasic  Posterior tibial:       Triphasic            Triphasic  Peroneal:   PREVIOUS ABI'S:  Date:  RIGHT:  LEFT:   IMPRESSION:  1. Normal right ABI with a great toe pressure of 120 mmHg consistent      with a normal TBI of 0.80.  2. Normal left ABI with a great toe pressure of 120 mmHg consistent      with a normal TBI of 0.80.  3. Preliminary report faxed to Dr. Petrinitz's office at 2:45 on      12/24/2007.   ___________________________________________  Quita Skye. Hart Rochester, M.D.   PB/MEDQ  D:  12/24/2007  T:  12/24/2007  Job:  [id number]

## 2010-11-29 NOTE — Assessment & Plan Note (Signed)
[facility]                              [facility] OFFICE NOTE   NAME:Briner, Voncille                      MRN:          [id number]  DATE:04/09/2006                            DOB:          [birth year]    A 77 year old female seen today for an annual evaluation.  She has a history  of hypertension, hypothyroidism, colonic polyps.  She has been seen by Dr.  Earlene Plater recently for some hemorrhoidal care and treatment.  He also removed an  apparent dysplastic nevus from her lower abdominal region.  She has DJD and  some chronic low back pain, menopausal syndrome, also a history of seasonal  allergic rhinitis.  Surgical procedures have included a hysterectomy,  tonsillectomy, and appendectomy.  Last colonoscopy was in September 2004.   FAMILY HISTORY:  Unchanged.  Both parents died at 35.  Father had coronary  artery disease.  Mother and an aunt had uterine cancer.   EXAM:  Revealed a healthy appearing white female in no acute distress.  Blood pressure was 140/80.  FUNDI, EARS, NOSE, AND THROAT:  Clear.  NECK:  No bruits.  CHEST:  Clear except for a few crackles at the right base.  CARDIOVASCULAR:  Normal heart sounds.  No murmurs.  BREASTS:  Normal.  ABDOMEN:  Soft and nontender.  No organomegaly.  External genitalia normal.  PELVIC:  Absent uterus.  No adnexal masses.  Stool heme-negative.  EXTREMITIES:  Revealed intact peripheral pulses.  The right dorsalis pedis  pulse may have been slightly diminished.   IMPRESSION:  As above.  Clinically stable.   DISPOSITION:  Laboratory update will be reviewed and medical regimen will be  unchanged.  We will reassess in 6 months.            ______________________________  Gordy Savers, MD     PFK/MedQ  DD:  04/09/2006  DT:  04/10/2006  Job #:  [id number]

## 2010-12-11 ENCOUNTER — Encounter: Payer: Self-pay | Admitting: Internal Medicine

## 2010-12-12 ENCOUNTER — Ambulatory Visit (INDEPENDENT_AMBULATORY_CARE_PROVIDER_SITE_OTHER): Payer: Medicare Other | Admitting: Internal Medicine

## 2010-12-12 ENCOUNTER — Encounter: Payer: Self-pay | Admitting: Internal Medicine

## 2010-12-12 DIAGNOSIS — I10 Essential (primary) hypertension: Secondary | ICD-10-CM

## 2010-12-12 DIAGNOSIS — E039 Hypothyroidism, unspecified: Secondary | ICD-10-CM

## 2010-12-12 MED ORDER — HYDROCHLOROTHIAZIDE 25 MG PO TABS: 25.0000 mg | ORAL_TABLET | Freq: Every day | ORAL | Status: DC

## 2010-12-12 MED ORDER — LEVOTHYROXINE SODIUM 75 MCG PO TABS: 75.0000 ug | ORAL_TABLET | Freq: Every day | ORAL | Status: DC

## 2010-12-12 NOTE — Patient Instructions (Signed)
It is important that you exercise regularly, at least 20 minutes 3 to 4 times per week.  If you develop chest pain or shortness of breath seek  medical attention.  Take a calcium supplement, plus 800-1200 units of vitamin D  Return in 6 months for follow-up  

## 2010-12-12 NOTE — Progress Notes (Signed)
  Subjective:    Patient ID: Janice Small, female    DOB: 1928/07/03, 77 y.o.   MRN: [id number]  HPI  77 year old patient who is seen today for followup of her hypertension. She also has treated hypothyroidism. She was evaluated by orthopedics recently due to right hip pain she was felt to have had a healed fracture from a fall several weeks prior. She will be starting physical therapy next week. Otherwise she is doing quite well     Review of Systems  Constitutional: Negative.   HENT: Negative for hearing loss, congestion, sore throat, rhinorrhea, dental problem, sinus pressure and tinnitus.   Eyes: Negative for pain, discharge and visual disturbance.  Respiratory: Negative for cough and shortness of breath.   Cardiovascular: Negative for chest pain, palpitations and leg swelling.  Gastrointestinal: Negative for nausea, vomiting, abdominal pain, diarrhea, constipation, blood in stool and abdominal distention.  Genitourinary: Negative for dysuria, urgency, frequency, hematuria, flank pain, vaginal bleeding, vaginal discharge, difficulty urinating, vaginal pain and pelvic pain.  Musculoskeletal: Positive for gait problem. Negative for joint swelling and arthralgias.  Skin: Negative for rash.  Neurological: Negative for dizziness, syncope, speech difficulty, weakness, numbness and headaches.  Hematological: Negative for adenopathy.  Psychiatric/Behavioral: Negative for behavioral problems, dysphoric mood and agitation. The patient is not nervous/anxious.        Objective:   Physical Exam  Constitutional: She is oriented to person, place, and time. She appears well-developed and well-nourished.  HENT:  Head: Normocephalic.  Right Ear: External ear normal.  Left Ear: External ear normal.  Mouth/Throat: Oropharynx is clear and moist.  Eyes: Conjunctivae and EOM are normal. Pupils are equal, round, and reactive to light.  Neck: Normal range of motion. Neck supple. No thyromegaly present.    Cardiovascular: Normal rate, regular rhythm, normal heart sounds and intact distal pulses.   Pulmonary/Chest: Effort normal and breath sounds normal.  Abdominal: Soft. Bowel sounds are normal. She exhibits no mass. There is no tenderness.  Musculoskeletal: Normal range of motion.       Full range of motion of the right hip  Lymphadenopathy:    She has no cervical adenopathy.  Neurological: She is alert and oriented to person, place, and time.  Skin: Skin is warm and dry. No rash noted.  Psychiatric: She has a normal mood and affect. Her behavior is normal.          Assessment & Plan:  Right hip pain Hypertension controlled Hypothyroidism  We'll continue present medicines. All medications refilled We'll recheck in 6 months for her annual exam or when necessary

## 2011-01-20 ENCOUNTER — Telehealth: Payer: Self-pay

## 2011-01-20 NOTE — Telephone Encounter (Signed)
VM - please call - no other message left

## 2011-01-20 NOTE — Telephone Encounter (Signed)
Attempt to call - ans mach at home# - LMTCB KIK

## 2011-02-03 ENCOUNTER — Other Ambulatory Visit: Payer: Self-pay | Admitting: Internal Medicine

## 2011-02-05 ENCOUNTER — Telehealth: Payer: Self-pay

## 2011-02-05 NOTE — Telephone Encounter (Signed)
Opened in error

## 2011-04-03 ENCOUNTER — Telehealth: Payer: Self-pay | Admitting: *Deleted

## 2011-04-03 NOTE — Telephone Encounter (Signed)
Opened in ERROR

## 2011-05-16 ENCOUNTER — Telehealth: Payer: Self-pay | Admitting: *Deleted

## 2011-05-16 NOTE — Telephone Encounter (Signed)
Only wants to speak to [facility], please.

## 2011-05-19 NOTE — Telephone Encounter (Signed)
Attempt to call- hm# - ans mach - attempt to leave msg - unsure if it went through - [facility]

## 2011-06-02 ENCOUNTER — Encounter: Payer: Self-pay | Admitting: Internal Medicine

## 2011-06-16 ENCOUNTER — Encounter: Payer: Self-pay | Admitting: Internal Medicine

## 2011-06-16 ENCOUNTER — Ambulatory Visit (INDEPENDENT_AMBULATORY_CARE_PROVIDER_SITE_OTHER): Payer: Medicare Other | Admitting: Internal Medicine

## 2011-06-16 DIAGNOSIS — Z86010 Personal history of colonic polyps: Secondary | ICD-10-CM

## 2011-06-16 DIAGNOSIS — Z0000 Encounter for general adult medical examination without abnormal findings: Secondary | ICD-10-CM

## 2011-06-16 DIAGNOSIS — E039 Hypothyroidism, unspecified: Secondary | ICD-10-CM

## 2011-06-16 DIAGNOSIS — Z23 Encounter for immunization: Secondary | ICD-10-CM

## 2011-06-16 DIAGNOSIS — I10 Essential (primary) hypertension: Secondary | ICD-10-CM

## 2011-06-16 LAB — COMPREHENSIVE METABOLIC PANEL
ALT: 20 U/L (ref 0–35)
Albumin: 4.6 g/dL (ref 3.5–5.2)
Alkaline Phosphatase: 67 U/L (ref 39–117)
BUN: 26 mg/dL — ABNORMAL HIGH (ref 6–23)
Calcium: 10.4 mg/dL (ref 8.4–10.5)
Chloride: 103 mEq/L (ref 96–112)
GFR: 79.94 mL/min (ref >60.00)
Potassium: 3.5 mEq/L (ref 3.5–5.1)
Sodium: 141 mEq/L (ref 135–145)
Total Protein: 7.1 g/dL (ref 6.0–8.3)

## 2011-06-16 LAB — CBC WITH DIFFERENTIAL/PLATELET
Basophils Absolute: 0 10*3/uL (ref 0.0–0.1)
Eosinophils Absolute: 0 10*3/uL (ref 0.0–0.7)
Lymphocytes Relative: 24.2 % (ref 12.0–46.0)
MCHC: 33.7 g/dL (ref 30.0–36.0)
Monocytes Relative: 9 % (ref 3.0–12.0)
Neutro Abs: 3.3 10*3/uL (ref 1.4–7.7)
Neutrophils Relative %: 65.2 % (ref 43.0–77.0)
Platelets: 143 10*3/uL — ABNORMAL LOW (ref 150.0–400.0)
RDW: 13.7 % (ref 11.5–14.6)

## 2011-06-16 LAB — TSH: TSH: 2.09 u[IU]/mL (ref 0.35–5.50)

## 2011-06-16 MED ORDER — LEVOTHYROXINE SODIUM 75 MCG PO TABS: 75.0000 ug | ORAL_TABLET | Freq: Every day | ORAL | Status: DC

## 2011-06-16 MED ORDER — HYDROCHLOROTHIAZIDE 25 MG PO TABS: 25.0000 mg | ORAL_TABLET | Freq: Every day | ORAL | Status: DC

## 2011-06-16 MED ORDER — POTASSIUM CHLORIDE CRYS ER 20 MEQ PO TBCR: 20.0000 meq | EXTENDED_RELEASE_TABLET | Freq: Every day | ORAL | Status: DC

## 2011-06-16 MED ORDER — AMLODIPINE BESYLATE 10 MG PO TABS: 10.0000 mg | ORAL_TABLET | Freq: Every day | ORAL | Status: DC

## 2011-06-16 NOTE — Progress Notes (Signed)
Subjective:    Patient ID: Janice Small, female    DOB: 1927/07/22, 77 y.o.   MRN: [id number]  HPI History of Present Illness:   77 year-old patient who is seen today for a wellness exam. Medical problems include hypertension, hypothyroidism, and a history of colonic polyps. Last colonoscopy was 2004. She is doing quite well. She has a history of mild stable hypercalcemia. Medical regimen includes calcium and vitamin D supplementation as well as thiazide diuretic for her hypertension.  Here for Medicare AWV:   1. Risk factors based on Past M, S, F history: Cardio vascular risk factors include hypertension, and a family history of coronary artery disease  2. Physical Activities: remains quite active without limiting factors. Does have some mild chronic low back pain  3. Depression/mood: no history of depression or mood disorder  4. Hearing: no deficits  5. ADL's: independent in all aspects of daily living  6. Fall Risk: low  7. Home Safety: no problems identified  8. Height, weight, &visual acuity:height and weight stable. No change in visual acuity  9. Counseling: heart healthy diet regular. Exercise, all encouraged  10. Labs ordered based on risk factors: laboratory profile, including lipid profile, and TSH will be reviewed  11. Referral Coordination- - colonoscopy discussed; have elected to  check for fecal occult blood testing annually we'll also check a CBC today  12. Care Plan- follow-up colonoscopy encouraged  13. Cognitive Assessment- alert and oriented, with normal affect. No cognitive dysfunction, or memory impairment. Handles all executive functioning of household   Allergies:  1) Codeine Phosphate (Codeine Phosphate)   Past History:  Past Medical History:  Reviewed history from 05/31/2008 and no changes required.  Colonic polyps, hx of  Hypertension  Hypothyroidism  external hemorrhoids   Past Surgical History:  Reviewed history from 05/31/2008 and no changes  required.  Appendectomy 1981  Hysterectomy 1981  Tonsillectomy  gravida 3, para 3, abortus zero  history breast biopsy  colonoscopy September 2004   Family History:  Reviewed history from 05/31/2008 and no changes required.  both her parents died at 45, father from coronary artery disease,  mother from uterine cancer.  No siblings   Social History:  Reviewed history from 11/28/2008 and no changes required.  Married  Never Smoked  husband status post CABG in 2010-history of dementia  3 sons, 5 grandchildren    Review of Systems  Constitutional: Negative for fever, appetite change, fatigue and unexpected weight change.  HENT: Negative for hearing loss, ear pain, nosebleeds, congestion, sore throat, mouth sores, trouble swallowing, neck stiffness, dental problem, voice change, sinus pressure and tinnitus.   Eyes: Negative for photophobia, pain, redness and visual disturbance.  Respiratory: Negative for cough, chest tightness and shortness of breath.   Cardiovascular: Negative for chest pain, palpitations and leg swelling.  Gastrointestinal: Negative for nausea, vomiting, abdominal pain, diarrhea, constipation, blood in stool, abdominal distention and rectal pain.  Genitourinary: Negative for dysuria, urgency, frequency, hematuria, flank pain, vaginal bleeding, vaginal discharge, difficulty urinating, genital sores, vaginal pain, menstrual problem and pelvic pain.  Musculoskeletal: Negative for back pain and arthralgias.  Skin: Negative for rash.  Neurological: Negative for dizziness, syncope, speech difficulty, weakness, light-headedness, numbness and headaches.  Hematological: Negative for adenopathy. Does not bruise/bleed easily.  Psychiatric/Behavioral: Negative for suicidal ideas, behavioral problems, self-injury, dysphoric mood and agitation. The patient is not nervous/anxious.        Objective:   Physical Exam  Constitutional: She is oriented to person, place, and time.  She appears well-developed and well-nourished.  HENT:  Head: Normocephalic and atraumatic.  Right Ear: External ear normal.  Left Ear: External ear normal.  Mouth/Throat: Oropharynx is clear and moist.  Eyes: Conjunctivae and EOM are normal.  Neck: Normal range of motion. Neck supple. No JVD present. No thyromegaly present.  Cardiovascular: Normal rate, regular rhythm, normal heart sounds and intact distal pulses.   No murmur heard. Pulmonary/Chest: Effort normal and breath sounds normal. She has no wheezes. She has no rales.  Abdominal: Soft. Bowel sounds are normal. She exhibits no distension and no mass. There is no tenderness. There is no rebound and no guarding.  Musculoskeletal: Normal range of motion. She exhibits no edema and no tenderness.  Neurological: She is alert and oriented to person, place, and time. She has normal reflexes. No cranial nerve deficit. She exhibits normal muscle tone. Coordination normal.  Skin: Skin is warm and dry. No rash noted.  Psychiatric: She has a normal mood and affect. Her behavior is normal.          Assessment & Plan:   Preventive health History of colonic polyps. We'll check a CBC and check for occult blood annually. She does not wish to pursue further colonoscopies Hypertension well controlled Mild hypercalcemia. Probably related to the diuretic therapy. If this remains stable no change in therapy. If this level is more than just slightly elevated we'll discontinue thiazides

## 2011-06-16 NOTE — Patient Instructions (Addendum)
Limit your sodium (Salt) intake    It is important that you exercise regularly, at least 20 minutes 3 to 4 times per week.  If you develop chest pain or shortness of breath seek  medical attention.  Return in 6 months for follow-up  Please mail back slides to check for hidden blood in the stool

## 2011-07-02 LAB — FECAL OCCULT BLOOD, GUAIAC: Fecal Occult Blood: NEGATIVE

## 2011-07-07 ENCOUNTER — Encounter: Payer: Self-pay | Admitting: Internal Medicine

## 2011-08-13 ENCOUNTER — Other Ambulatory Visit: Payer: Self-pay | Admitting: Internal Medicine

## 2011-08-13 DIAGNOSIS — Z1231 Encounter for screening mammogram for malignant neoplasm of breast: Secondary | ICD-10-CM

## 2011-09-01 ENCOUNTER — Telehealth: Payer: Self-pay | Admitting: *Deleted

## 2011-09-01 NOTE — Telephone Encounter (Signed)
agree

## 2011-09-01 NOTE — Telephone Encounter (Signed)
Pt has a red, itchy rash under breasts, and has used cortisone that helped itching but not the rash!Marland KitchenMarland Kitchen  She would like another RX.  Does not want to come in.  Advised an antifungal since she is going out right now.  Will call her back if Dr. Kirtland Bouchard has another RX.

## 2011-09-01 NOTE — Telephone Encounter (Signed)
Notified pt. 

## 2011-09-11 ENCOUNTER — Ambulatory Visit
Admission: RE | Admit: 2011-09-11 | Discharge: 2011-09-11 | Disposition: A | Discharge status: Home / Self Care | Payer: Medicare Other | Source: Ambulatory Visit | Attending: Internal Medicine | Admitting: Internal Medicine

## 2011-09-11 DIAGNOSIS — Z1231 Encounter for screening mammogram for malignant neoplasm of breast: Secondary | ICD-10-CM

## 2011-10-14 ENCOUNTER — Telehealth: Payer: Self-pay | Admitting: Internal Medicine

## 2011-10-14 MED ORDER — AMLODIPINE BESYLATE 10 MG PO TABS: 10.0000 mg | ORAL_TABLET | Freq: Every day | ORAL | Status: DC

## 2011-10-14 NOTE — Telephone Encounter (Signed)
Pt requesting a refill on amLODipine (NORVASC) 10 MG tablet  [facility]

## 2011-10-14 NOTE — Telephone Encounter (Signed)
Spoke with pt -[facility] is where she want med to go.  faxed

## 2011-10-14 NOTE — Telephone Encounter (Signed)
[phone]  Pt requests you call her home phone number.  Did not give a pharmacy number.

## 2011-10-14 NOTE — Telephone Encounter (Signed)
Attempt to call - ans mach - LMTCB - need to know which pharmacy she would like me to use

## 2011-12-15 ENCOUNTER — Encounter: Payer: Self-pay | Admitting: Internal Medicine

## 2011-12-15 ENCOUNTER — Ambulatory Visit (INDEPENDENT_AMBULATORY_CARE_PROVIDER_SITE_OTHER): Payer: Medicare Other | Admitting: Internal Medicine

## 2011-12-15 VITALS — BP 126/82 | Temp 97.6°F | Wt 147.0 lb

## 2011-12-15 DIAGNOSIS — E039 Hypothyroidism, unspecified: Secondary | ICD-10-CM

## 2011-12-15 DIAGNOSIS — I10 Essential (primary) hypertension: Secondary | ICD-10-CM

## 2011-12-15 MED ORDER — LEVOTHYROXINE SODIUM 75 MCG PO TABS: 75.0000 ug | ORAL_TABLET | Freq: Every day | ORAL | Status: DC

## 2011-12-15 MED ORDER — HYDROCHLOROTHIAZIDE 25 MG PO TABS: 25.0000 mg | ORAL_TABLET | Freq: Every day | ORAL | Status: DC

## 2011-12-15 MED ORDER — AMLODIPINE BESYLATE 10 MG PO TABS: 10.0000 mg | ORAL_TABLET | Freq: Every day | ORAL | Status: DC

## 2011-12-15 MED ORDER — POTASSIUM CHLORIDE CRYS ER 20 MEQ PO TBCR: 20.0000 meq | EXTENDED_RELEASE_TABLET | Freq: Every day | ORAL | Status: DC

## 2011-12-15 NOTE — Progress Notes (Signed)
  Subjective:    Patient ID: Janice Small, female    DOB: 1927/08/10, 78 y.o.   MRN: [id number]  HPI  78 year old patient who is seen today for her six-month followup. She has treated hypertension that has been controlled on diuretic therapy as well as amlodipine. Blood pressure remains very well controlled. She has a history of mild stable hypercalcemia thought secondary to diuretic therapy. She remains asymptomatic. She has a history of colonic polyps as well as osteoarthritis. She is on levothyroxine supplementation for hypothyroidism. Denies any cardiopulmonary complaints. Does exercise 3 times weekly at the [facility]    Review of Systems  Constitutional: Negative.   HENT: Negative for hearing loss, congestion, sore throat, rhinorrhea, dental problem, sinus pressure and tinnitus.   Eyes: Negative for pain, discharge and visual disturbance.  Respiratory: Negative for cough and shortness of breath.   Cardiovascular: Negative for chest pain, palpitations and leg swelling.  Gastrointestinal: Negative for nausea, vomiting, abdominal pain, diarrhea, constipation, blood in stool and abdominal distention.  Genitourinary: Negative for dysuria, urgency, frequency, hematuria, flank pain, vaginal bleeding, vaginal discharge, difficulty urinating, vaginal pain and pelvic pain.  Musculoskeletal: Positive for back pain and arthralgias. Negative for joint swelling and gait problem.  Skin: Negative for rash.  Neurological: Negative for dizziness, syncope, speech difficulty, weakness, numbness and headaches.  Hematological: Negative for adenopathy.  Psychiatric/Behavioral: Negative for behavioral problems, dysphoric mood and agitation. The patient is not nervous/anxious.        Objective:   Physical Exam  Constitutional: She is oriented to person, place, and time. She appears well-developed and well-nourished.  HENT:  Head: Normocephalic.  Right Ear: External ear normal.  Left Ear: External ear normal.   Mouth/Throat: Oropharynx is clear and moist.  Eyes: Conjunctivae and EOM are normal. Pupils are equal, round, and reactive to light.  Neck: Normal range of motion. Neck supple. No thyromegaly present.  Cardiovascular: Normal rate, regular rhythm, normal heart sounds and intact distal pulses.   Pulmonary/Chest: Effort normal and breath sounds normal.  Abdominal: Soft. Bowel sounds are normal. She exhibits no mass. There is no tenderness.  Musculoskeletal: Normal range of motion.  Lymphadenopathy:    She has no cervical adenopathy.  Neurological: She is alert and oriented to person, place, and time.  Skin: Skin is warm and dry. No rash noted.  Psychiatric: She has a normal mood and affect. Her behavior is normal.          Assessment & Plan:   Hypertension well controlled. We'll continue present regimen of diuretic therapy and amlodipine Mild hypercalcemia. We'll recheck again at the time of her physical in the fall Hypothyroidism. We'll refill levothyroxine 75 mcg daily Osteoarthritis stable  All medications refilled

## 2011-12-15 NOTE — Patient Instructions (Signed)
Limit your sodium (Salt) intake  Please check your blood pressure on a regular basis.  If it is consistently greater than 150/90, please make an office appointment.    It is important that you exercise regularly, at least 20 minutes 3 to 4 times per week.  If you develop chest pain or shortness of breath seek  medical attention.  Take a calcium supplement, plus 800-1200 units of vitamin D  Return in 6 months for follow-up   

## 2012-02-17 ENCOUNTER — Other Ambulatory Visit: Payer: Self-pay | Admitting: Dermatology

## 2012-06-15 ENCOUNTER — Ambulatory Visit: Payer: Medicare Other | Admitting: Internal Medicine

## 2012-06-16 ENCOUNTER — Encounter: Payer: Self-pay | Admitting: Internal Medicine

## 2012-06-16 ENCOUNTER — Ambulatory Visit (INDEPENDENT_AMBULATORY_CARE_PROVIDER_SITE_OTHER): Payer: Medicare Other | Admitting: Internal Medicine

## 2012-06-16 VITALS — BP 120/80 | HR 75 | Temp 97.9°F | Resp 18 | Ht 60.5 in | Wt 147.0 lb

## 2012-06-16 DIAGNOSIS — N951 Menopausal and female climacteric states: Secondary | ICD-10-CM

## 2012-06-16 DIAGNOSIS — Z23 Encounter for immunization: Secondary | ICD-10-CM

## 2012-06-16 DIAGNOSIS — E039 Hypothyroidism, unspecified: Secondary | ICD-10-CM

## 2012-06-16 DIAGNOSIS — I10 Essential (primary) hypertension: Secondary | ICD-10-CM

## 2012-06-16 DIAGNOSIS — Z0000 Encounter for general adult medical examination without abnormal findings: Secondary | ICD-10-CM

## 2012-06-16 DIAGNOSIS — Z860100 Personal history of colon polyps, unspecified: Secondary | ICD-10-CM

## 2012-06-16 LAB — CBC WITH DIFFERENTIAL/PLATELET
Basophils Absolute: 0 10*3/uL (ref 0.0–0.1)
Eosinophils Absolute: 0.1 10*3/uL (ref 0.0–0.7)
HCT: 40.5 % (ref 36.0–46.0)
Hemoglobin: 13.3 g/dL (ref 12.0–15.0)
Lymphs Abs: 1.2 10*3/uL (ref 0.7–4.0)
MCHC: 32.7 g/dL (ref 30.0–36.0)
MCV: 94.8 fl (ref 78.0–100.0)
Monocytes Absolute: 0.5 10*3/uL (ref 0.1–1.0)
Monocytes Relative: 9.3 % (ref 3.0–12.0)
Neutro Abs: 3.4 10*3/uL (ref 1.4–7.7)
Platelets: 142 10*3/uL — ABNORMAL LOW (ref 150.0–400.0)
RDW: 13.2 % (ref 11.5–14.6)

## 2012-06-16 LAB — COMPREHENSIVE METABOLIC PANEL
ALT: 21 U/L (ref 0–35)
AST: 13 U/L (ref 0–37)
Alkaline Phosphatase: 59 U/L (ref 39–117)
CO2: 29 mEq/L (ref 19–32)
Creatinine, Ser: 0.8 mg/dL (ref 0.4–1.2)
Sodium: 139 mEq/L (ref 135–145)
Total Bilirubin: 0.9 mg/dL (ref 0.3–1.2)
Total Protein: 6.9 g/dL (ref 6.0–8.3)

## 2012-06-16 LAB — LDL CHOLESTEROL, DIRECT: Direct LDL: 111.8 mg/dL

## 2012-06-16 LAB — LIPID PANEL
Cholesterol: 205 mg/dL — ABNORMAL HIGH (ref 0–200)
Total CHOL/HDL Ratio: 2
Triglycerides: 85 mg/dL (ref 0.0–149.0)
VLDL: 17 mg/dL (ref 0.0–40.0)

## 2012-06-16 MED ORDER — LEVOTHYROXINE SODIUM 75 MCG PO TABS: 75.0000 ug | ORAL_TABLET | Freq: Every day | ORAL | Status: DC

## 2012-06-16 MED ORDER — AMLODIPINE BESYLATE 10 MG PO TABS: 10.0000 mg | ORAL_TABLET | Freq: Every day | ORAL | Status: DC

## 2012-06-16 MED ORDER — POTASSIUM CHLORIDE CRYS ER 20 MEQ PO TBCR: 20.0000 meq | EXTENDED_RELEASE_TABLET | Freq: Every day | ORAL | Status: DC

## 2012-06-16 MED ORDER — HYDROCHLOROTHIAZIDE 25 MG PO TABS: 25.0000 mg | ORAL_TABLET | Freq: Every day | ORAL | Status: DC

## 2012-06-16 NOTE — Patient Instructions (Signed)
Limit your sodium (Salt) intake    It is important that you exercise regularly, at least 20 minutes 3 to 4 times per week.  If you develop chest pain or shortness of breath seek  medical attention.  Please check your blood pressure on a regular basis.  If it is consistently greater than 150/90, please make an office appointment.  Return in one year for follow-up  Please return slides to check for hidden blood

## 2012-06-16 NOTE — Progress Notes (Signed)
Patient ID: Janice Small, female   DOB: 1928/01/28, 78 y.o.   MRN: [id number]  Subjective:    Patient ID: Janice Small, female    DOB: [birth year], 78 y.o.   MRN: [id number]  HPI History of Present Illness:   78  year-old patient who is seen today for a wellness exam. Medical problems include hypertension, hypothyroidism, and a history of colonic polyps. Last colonoscopy was 2004. She is doing quite well. She has a history of mild stable hypercalcemia. Medical regimen includes calcium and vitamin D supplementation as well as thiazide diuretic for her hypertension.  Here for Medicare AWV:   1. Risk factors based on Past M, S, F history: Cardio vascular risk factors include hypertension, and a family history of coronary artery disease  2. Physical Activities: remains quite active without limiting factors. Does have some mild chronic low back pain  3. Depression/mood: no history of depression or mood disorder  4. Hearing: no deficits  5. ADL's: independent in all aspects of daily living  6. Fall Risk: low  7. Home Safety: no problems identified  8. Height, weight, &visual acuity:height and weight stable. No change in visual acuity  9. Counseling: heart healthy diet regular. Exercise, all encouraged  10. Labs ordered based on risk factors: laboratory profile, including lipid profile, and TSH will be reviewed  11. Referral Coordination- - colonoscopy discussed; have elected to  check for fecal occult blood testing annually we'll also check a CBC today  12. Care Plan- follow-up colonoscopy encouraged  13. Cognitive Assessment- alert and oriented, with normal affect. No cognitive dysfunction, or memory impairment. Handles all executive functioning of household   Allergies:  1) Codeine Phosphate (Codeine Phosphate)   Past History:  Past Medical History:  Reviewed history from 05/31/2008 and no changes required.  Colonic polyps, hx of  Hypertension  Hypothyroidism  external hemorrhoids    Past Surgical History:  Reviewed history from 05/31/2008 and no changes required.  Appendectomy 1981  Hysterectomy 1981  Tonsillectomy  gravida 3, para 3, abortus zero  history breast biopsy  colonoscopy September 2004   Family History:  Reviewed history from 05/31/2008 and no changes required.  both her parents died at 66, father from coronary artery disease,  mother from uterine cancer.  No siblings   Social History:  Reviewed history from 11/28/2008 and no changes required.  Married  Never Smoked  husband status post CABG in 2010-history of dementia  3 sons, 5 grandchildren  Past Medical History  Diagnosis Date  . BREAST BIOPSY, HX OF 11/27/2006  . COLONIC POLYPS, HX OF 11/27/2006  . HEMORRHOIDS 01/17/2010  . HYPERTENSION 11/27/2006  . HYPOTHYROIDISM 11/27/2006  . MENOPAUSAL SYNDROME 11/27/2006    History   Social History  . Marital Status: Married    Spouse Name: N/A    Number of Children: N/A  . Years of Education: N/A   Occupational History  . Not on file.   Social History Main Topics  . Smoking status: Never Smoker   . Smokeless tobacco: Never Used  . Alcohol Use: Yes     Comment: rarely  . Drug Use: No  . Sexually Active: Not on file   Other Topics Concern  . Not on file   Social History Narrative  . No narrative on file    Past Surgical History  Procedure Date  . Appendectomy   . Abdominal hysterectomy   . Tonsillectomy   . Breast surgery     bx    Family History  Problem Relation Age of Onset  . Heart disease Father     Allergies  Allergen Reactions  . Codeine Phosphate     REACTION: unspecified    Current Outpatient Prescriptions on File Prior to Visit  Medication Sig Dispense Refill  . amLODipine (NORVASC) 10 MG tablet Take 1 tablet (10 mg total) by mouth daily.  90 tablet  3  . Calcium Carbonate (CALCIUM 500 PO) Take 1 tablet by mouth daily.        . hydrochlorothiazide (HYDRODIURIL) 25 MG tablet Take 1 tablet (25 mg total)  by mouth daily.  90 tablet  6  . levothyroxine (SYNTHROID, LEVOTHROID) 75 MCG tablet Take 1 tablet (75 mcg total) by mouth daily.  90 tablet  6  . Multiple Vitamin (MULTIVITAMIN) tablet Take 1 tablet by mouth daily.        . potassium chloride SA (KLOR-CON M20) 20 MEQ tablet Take 1 tablet (20 mEq total) by mouth daily.  90 tablet  3    BP 120/80  Pulse 75  Temp 97.9 F (36.6 C) (Oral)  Resp 18  Ht 5' 0.5" (1.537 m)  Wt 147 lb (66.679 kg)  BMI 28.24 kg/m2  SpO2 97%    Review of Systems  Constitutional: Negative for fever, appetite change, fatigue and unexpected weight change.  HENT: Negative for hearing loss, ear pain, nosebleeds, congestion, sore throat, mouth sores, trouble swallowing, neck stiffness, dental problem, voice change, sinus pressure and tinnitus.   Eyes: Negative for photophobia, pain, redness and visual disturbance.  Respiratory: Negative for cough, chest tightness and shortness of breath.   Cardiovascular: Negative for chest pain, palpitations and leg swelling.  Gastrointestinal: Negative for nausea, vomiting, abdominal pain, diarrhea, constipation, blood in stool, abdominal distention and rectal pain.  Genitourinary: Negative for dysuria, urgency, frequency, hematuria, flank pain, vaginal bleeding, vaginal discharge, difficulty urinating, genital sores, vaginal pain, menstrual problem and pelvic pain.  Musculoskeletal: Negative for back pain and arthralgias.  Skin: Negative for rash.  Neurological: Negative for dizziness, syncope, speech difficulty, weakness, light-headedness, numbness and headaches.  Hematological: Negative for adenopathy. Does not bruise/bleed easily.  Psychiatric/Behavioral: Negative for suicidal ideas, behavioral problems, self-injury, dysphoric mood and agitation. The patient is not nervous/anxious.        Objective:   Physical Exam  Constitutional: She is oriented to person, place, and time. She appears well-developed and well-nourished.   HENT:  Head: Normocephalic and atraumatic.  Right Ear: External ear normal.  Left Ear: External ear normal.  Mouth/Throat: Oropharynx is clear and moist.  Eyes: Conjunctivae normal and EOM are normal.  Neck: Normal range of motion. Neck supple. No JVD present. No thyromegaly present.  Cardiovascular: Normal rate, regular rhythm, normal heart sounds and intact distal pulses.   No murmur heard. Pulmonary/Chest: Effort normal and breath sounds normal. She has no wheezes. She has no rales.  Abdominal: Soft. Bowel sounds are normal. She exhibits no distension and no mass. There is no tenderness. There is no rebound and no guarding.  Musculoskeletal: Normal range of motion. She exhibits no edema and no tenderness.  Neurological: She is alert and oriented to person, place, and time. She has normal reflexes. No cranial nerve deficit. She exhibits normal muscle tone. Coordination normal.  Skin: Skin is warm and dry. No rash noted.  Psychiatric: She has a normal mood and affect. Her behavior is normal.          Assessment & Plan:   Preventive health History of colonic polyps. We'll check a  CBC and check for occult blood annually. She does not wish to pursue further colonoscopies Hypertension well controlled Mild hypercalcemia, h/o.This was not noted 2012.   If this remains stable no change in therapy.

## 2012-06-24 ENCOUNTER — Encounter: Payer: Self-pay | Admitting: Internal Medicine

## 2012-08-19 ENCOUNTER — Other Ambulatory Visit: Payer: Self-pay | Admitting: Internal Medicine

## 2012-08-19 DIAGNOSIS — Z1231 Encounter for screening mammogram for malignant neoplasm of breast: Secondary | ICD-10-CM

## 2012-09-22 ENCOUNTER — Ambulatory Visit
Admission: RE | Admit: 2012-09-22 | Discharge: 2012-09-22 | Disposition: A | Discharge status: Home / Self Care | Payer: Medicare Other | Source: Ambulatory Visit | Attending: Internal Medicine | Admitting: Internal Medicine

## 2012-09-22 DIAGNOSIS — Z1231 Encounter for screening mammogram for malignant neoplasm of breast: Secondary | ICD-10-CM

## 2013-04-01 ENCOUNTER — Ambulatory Visit (INDEPENDENT_AMBULATORY_CARE_PROVIDER_SITE_OTHER): Payer: Medicare Other | Admitting: *Deleted

## 2013-04-01 DIAGNOSIS — Z23 Encounter for immunization: Secondary | ICD-10-CM

## 2013-06-17 ENCOUNTER — Encounter: Payer: Self-pay | Admitting: Internal Medicine

## 2013-06-17 ENCOUNTER — Ambulatory Visit (INDEPENDENT_AMBULATORY_CARE_PROVIDER_SITE_OTHER): Payer: Medicare Other | Admitting: Internal Medicine

## 2013-06-17 VITALS — BP 138/70 | HR 67 | Temp 98.2°F | Resp 20 | Ht 60.5 in | Wt 147.0 lb

## 2013-06-17 DIAGNOSIS — Z860100 Personal history of colon polyps, unspecified: Secondary | ICD-10-CM

## 2013-06-17 DIAGNOSIS — Z0000 Encounter for general adult medical examination without abnormal findings: Secondary | ICD-10-CM

## 2013-06-17 DIAGNOSIS — E039 Hypothyroidism, unspecified: Secondary | ICD-10-CM

## 2013-06-17 DIAGNOSIS — Z23 Encounter for immunization: Secondary | ICD-10-CM

## 2013-06-17 DIAGNOSIS — E785 Hyperlipidemia, unspecified: Secondary | ICD-10-CM

## 2013-06-17 DIAGNOSIS — I10 Essential (primary) hypertension: Secondary | ICD-10-CM

## 2013-06-17 LAB — CBC WITH DIFFERENTIAL/PLATELET
Eosinophils Relative: 1.2 % (ref 0.0–5.0)
HCT: 41.5 % (ref 36.0–46.0)
Hemoglobin: 13.8 g/dL (ref 12.0–15.0)
Lymphocytes Relative: 20.9 % (ref 12.0–46.0)
Lymphs Abs: 1.2 10*3/uL (ref 0.7–4.0)
Monocytes Relative: 9.1 % (ref 3.0–12.0)
Neutro Abs: 3.9 10*3/uL (ref 1.4–7.7)
Platelets: 196 10*3/uL (ref 150.0–400.0)
WBC: 5.8 10*3/uL (ref 4.5–10.5)

## 2013-06-17 LAB — COMPREHENSIVE METABOLIC PANEL
CO2: 28 mEq/L (ref 19–32)
Creatinine, Ser: 0.7 mg/dL (ref 0.4–1.2)
GFR: 80.81 mL/min (ref >60.00)
Glucose, Bld: 107 mg/dL — ABNORMAL HIGH (ref 70–99)
Total Bilirubin: 0.7 mg/dL (ref 0.3–1.2)

## 2013-06-17 LAB — LIPID PANEL
Cholesterol: 219 mg/dL — ABNORMAL HIGH (ref 0–200)
Triglycerides: 91 mg/dL (ref 0.0–149.0)

## 2013-06-17 LAB — TSH: TSH: 2.46 u[IU]/mL (ref 0.35–5.50)

## 2013-06-17 MED ORDER — FUROSEMIDE 20 MG PO TABS: ORAL_TABLET | ORAL | Status: DC

## 2013-06-17 MED ORDER — AMLODIPINE BESYLATE 10 MG PO TABS: 10.0000 mg | ORAL_TABLET | Freq: Every day | ORAL | Status: DC

## 2013-06-17 MED ORDER — LISINOPRIL 10 MG PO TABS: 10.0000 mg | ORAL_TABLET | Freq: Every day | ORAL | Status: DC

## 2013-06-17 MED ORDER — LEVOTHYROXINE SODIUM 75 MCG PO TABS: 75.0000 ug | ORAL_TABLET | Freq: Every day | ORAL | Status: DC

## 2013-06-17 NOTE — Progress Notes (Signed)
Pre-visit discussion using our clinic review tool. No additional management support is needed unless otherwise documented below in the visit note.  

## 2013-06-17 NOTE — Patient Instructions (Signed)
Limit your sodium (Salt) intake  Return in 6 months for follow-up    It is important that you exercise regularly, at least 20 minutes 3 to 4 times per week.  If you develop chest pain or shortness of breath seek  medical attention.  

## 2013-06-17 NOTE — Progress Notes (Signed)
Subjective:    Patient ID: Janice Small, female    DOB: June 15, 1928, 79 y.o.   MRN: [id number]  HPI Pre-visit discussion using our clinic review tool. No additional management support is needed unless otherwise documented below in the visit note.    History of Present Illness:   79  year-old patient who is seen today for a wellness exam. Medical problems include hypertension, hypothyroidism, and a history of colonic polyps. Last colonoscopy was 2004. She is doing quite well. She has a history of mild stable hypercalcemia. Medical regimen includes calcium and vitamin D supplementation as well as thiazide diuretic for her hypertension. She has had some mild URI symptoms over the past week.  Here for Medicare AWV:   1. Risk factors based on Past M, S, F history: Cardio vascular risk factors include hypertension, and a family history of coronary artery disease  2. Physical Activities: remains quite active without limiting factors. Does have some mild chronic low back pain  3. Depression/mood: no history of depression or mood disorder  4. Hearing: no deficits  5. ADL's: independent in all aspects of daily living  6. Fall Risk: low  7. Home Safety: no problems identified  8. Height, weight, &visual acuity:height and weight stable. No change in visual acuity  9. Counseling: heart healthy diet regular. Exercise, all encouraged  10. Labs ordered based on risk factors: laboratory profile, including lipid profile, and TSH will be reviewed  11. Referral Coordination- - colonoscopy discussed; have elected to  check for fecal occult blood testing annually we'll also check a CBC today  12. Care Plan- follow-up lab  13. Cognitive Assessment- alert and oriented, with normal affect. No cognitive dysfunction, or memory impairment. Handles all executive functioning of household   Allergies:  1) Codeine Phosphate (Codeine Phosphate)   Past History:   Colonic polyps, hx of  Hypertension   Hypothyroidism  external hemorrhoids   Past Surgical History:   Appendectomy 1981  Hysterectomy 1981  Tonsillectomy  gravida 3, para 3, abortus zero  history breast biopsy  colonoscopy September 2004   Family History:   both her parents died at 40, father from coronary artery disease,  mother from uterine cancer.  No siblings   Social History:   Married  Never Smoked  husband status post CABG in 2010-history of dementia  3 sons, 5 grandchildren    Review of Systems  Constitutional: Negative for fever, appetite change, fatigue and unexpected weight change.  HENT: Negative for congestion, dental problem, ear pain, hearing loss, mouth sores, nosebleeds, sinus pressure, sore throat, tinnitus, trouble swallowing and voice change.   Eyes: Negative for photophobia, pain, redness and visual disturbance.  Respiratory: Negative for cough, chest tightness and shortness of breath.   Cardiovascular: Negative for chest pain, palpitations and leg swelling.  Gastrointestinal: Negative for nausea, vomiting, abdominal pain, diarrhea, constipation, blood in stool, abdominal distention and rectal pain.  Genitourinary: Negative for dysuria, urgency, frequency, hematuria, flank pain, vaginal bleeding, vaginal discharge, difficulty urinating, genital sores, vaginal pain, menstrual problem and pelvic pain.  Musculoskeletal: Negative for arthralgias, back pain and neck stiffness.  Skin: Negative for rash.  Neurological: Negative for dizziness, syncope, speech difficulty, weakness, light-headedness, numbness and headaches.  Hematological: Negative for adenopathy. Does not bruise/bleed easily.  Psychiatric/Behavioral: Negative for suicidal ideas, behavioral problems, self-injury, dysphoric mood and agitation. The patient is not nervous/anxious.        Objective:   Physical Exam  Constitutional: She is oriented to person, place, and time. She  appears well-developed and well-nourished.  HENT:   Head: Normocephalic and atraumatic.  Right Ear: External ear normal.  Left Ear: External ear normal.  Mouth/Throat: Oropharynx is clear and moist.  Eyes: Conjunctivae and EOM are normal.  Neck: Normal range of motion. Neck supple. No JVD present. No thyromegaly present.  Cardiovascular: Normal rate, regular rhythm, normal heart sounds and intact distal pulses.   No murmur heard. Pulmonary/Chest: Effort normal. She has no wheezes. She has rales.  Few crackles right base  Abdominal: Soft. Bowel sounds are normal. She exhibits no distension and no mass. There is no tenderness. There is no rebound and no guarding.  Musculoskeletal: Normal range of motion. She exhibits no edema and no tenderness.  Neurological: She is alert and oriented to person, place, and time. She has normal reflexes. No cranial nerve deficit. She exhibits normal muscle tone. Coordination normal.  Skin: Skin is warm and dry. No rash noted.  Psychiatric: She has a normal mood and affect. Her behavior is normal.          Assessment & Plan:   Preventive health History of colonic polyps. We'll check a CBC and check for occult blood annually. Hypertension well controlled Mild hypercalcemia. Probably related to the diuretic therapy. We'll discontinue and substitute lisinopril  Viral URI. Symptomatic treatment

## 2013-06-18 ENCOUNTER — Other Ambulatory Visit: Payer: Self-pay | Admitting: *Deleted

## 2013-06-18 MED ORDER — LISINOPRIL 10 MG PO TABS: 10.0000 mg | ORAL_TABLET | Freq: Every day | ORAL | Status: DC

## 2013-08-04 ENCOUNTER — Encounter: Payer: Self-pay | Admitting: Internal Medicine

## 2013-08-04 ENCOUNTER — Ambulatory Visit (INDEPENDENT_AMBULATORY_CARE_PROVIDER_SITE_OTHER): Payer: Medicare Other | Admitting: Internal Medicine

## 2013-08-04 VITALS — BP 136/70 | HR 92 | Temp 98.4°F | Resp 20 | Ht 60.5 in | Wt 149.0 lb

## 2013-08-04 DIAGNOSIS — B9789 Other viral agents as the cause of diseases classified elsewhere: Secondary | ICD-10-CM

## 2013-08-04 DIAGNOSIS — I10 Essential (primary) hypertension: Secondary | ICD-10-CM

## 2013-08-04 DIAGNOSIS — J069 Acute upper respiratory infection, unspecified: Secondary | ICD-10-CM

## 2013-08-04 DIAGNOSIS — E039 Hypothyroidism, unspecified: Secondary | ICD-10-CM

## 2013-08-04 MED ORDER — BENZONATATE 100 MG PO CAPS: 100.0000 mg | ORAL_CAPSULE | Freq: Two times a day (BID) | ORAL | Status: DC | PRN

## 2013-08-04 NOTE — Progress Notes (Signed)
Subjective:    Patient ID: Janice Small, female    DOB: 04/12/1928, 80 y.o.   MRN: [id number]  HPI  80 year old patient who has a history of treated hypertension. She was seen at the urgent care after Christmas and treated for a URI. Symptoms improved but last night had the onset of recurrent refractory cough. There's been no fever or significant sputum production. No wheezing shortness of breath or chest pain.  Past Medical History  Diagnosis Date  . BREAST BIOPSY, HX OF 11/27/2006  . COLONIC POLYPS, HX OF 11/27/2006  . HEMORRHOIDS 01/17/2010  . HYPERTENSION 11/27/2006  . HYPOTHYROIDISM 11/27/2006  . MENOPAUSAL SYNDROME 11/27/2006    History   Social History  . Marital Status: Married    Spouse Name: N/A    Number of Children: N/A  . Years of Education: N/A   Occupational History  . Not on file.   Social History Main Topics  . Smoking status: Never Smoker   . Smokeless tobacco: Never Used  . Alcohol Use: Yes     Comment: rarely  . Drug Use: No  . Sexual Activity: Not on file   Other Topics Concern  . Not on file   Social History Narrative  . No narrative on file    Past Surgical History  Procedure Laterality Date  . Appendectomy    . Abdominal hysterectomy    . Tonsillectomy    . Breast surgery      bx    Family History  Problem Relation Age of Onset  . Heart disease Father     Allergies  Allergen Reactions  . Codeine Phosphate     REACTION: unspecified    Current Outpatient Prescriptions on File Prior to Visit  Medication Sig Dispense Refill  . amLODipine (NORVASC) 10 MG tablet Take 1 tablet (10 mg total) by mouth daily.  90 tablet  3  . Calcium Carbonate (CALCIUM 500 PO) Take 1 tablet by mouth daily.        . furosemide (LASIX) 20 MG tablet 1 tablet daily as needed for fluid retention  90 tablet  3  . levothyroxine (SYNTHROID, LEVOTHROID) 75 MCG tablet Take 1 tablet (75 mcg total) by mouth daily.  90 tablet  3  . lisinopril (PRINIVIL,ZESTRIL) 10 MG  tablet Take 1 tablet (10 mg total) by mouth daily.  90 tablet  3  . Multiple Vitamin (MULTIVITAMIN) tablet Take 1 tablet by mouth daily.         No current facility-administered medications on file prior to visit.    BP 136/70  Pulse 92  Temp(Src) 98.4 F (36.9 C) (Oral)  Resp 20  Ht 5' 0.5" (1.537 m)  Wt 149 lb (67.586 kg)  BMI 28.61 kg/m2  SpO2 95%       Review of Systems  Constitutional: Positive for appetite change and fatigue.  HENT: Negative for congestion, dental problem, hearing loss, rhinorrhea, sinus pressure, sore throat and tinnitus.   Eyes: Negative for pain, discharge and visual disturbance.  Respiratory: Positive for cough. Negative for shortness of breath.   Cardiovascular: Negative for chest pain, palpitations and leg swelling.  Gastrointestinal: Negative for nausea, vomiting, abdominal pain, diarrhea, constipation, blood in stool and abdominal distention.  Genitourinary: Negative for dysuria, urgency, frequency, hematuria, flank pain, vaginal bleeding, vaginal discharge, difficulty urinating, vaginal pain and pelvic pain.  Musculoskeletal: Negative for arthralgias, gait problem and joint swelling.  Skin: Negative for rash.  Neurological: Negative for dizziness, syncope, speech difficulty, weakness, numbness and  headaches.  Hematological: Negative for adenopathy.  Psychiatric/Behavioral: Negative for behavioral problems, dysphoric mood and agitation. The patient is not nervous/anxious.        Objective:   Physical Exam  Constitutional: She is oriented to person, place, and time. She appears well-developed and well-nourished.  HENT:  Head: Normocephalic.  Right Ear: External ear normal.  Left Ear: External ear normal.  Mouth/Throat: Oropharynx is clear and moist.  Eyes: Conjunctivae and EOM are normal. Pupils are equal, round, and reactive to light.  Neck: Normal range of motion. Neck supple. No thyromegaly present.  Cardiovascular: Normal rate, regular  rhythm, normal heart sounds and intact distal pulses.   Pulmonary/Chest: Effort normal and breath sounds normal. No respiratory distress. She has no wheezes. She has no rales.  Abdominal: Soft. Bowel sounds are normal. She exhibits no mass. There is no tenderness.  Musculoskeletal: Normal range of motion.  Lymphadenopathy:    She has no cervical adenopathy.  Neurological: She is alert and oriented to person, place, and time.  Skin: Skin is warm and dry. No rash noted.  Psychiatric: She has a normal mood and affect. Her behavior is normal.          Assessment & Plan:   Viral URI with cough. We'll treat symptomatically Hypertension stable

## 2013-08-04 NOTE — Progress Notes (Signed)
Pre-visit discussion using our clinic review tool. No additional management support is needed unless otherwise documented below in the visit note.  

## 2013-08-04 NOTE — Patient Instructions (Signed)
Acute bronchitis symptoms for less than 10 days are generally not helped by antibiotics.  Take over-the-counter expectorants and cough medications such as  Mucinex DM.  Call if there is no improvement in 5 to 7 days or if he developed worsening cough, fever, or new symptoms, such as shortness of breath or chest pain.    

## 2013-08-08 ENCOUNTER — Ambulatory Visit: Payer: Medicare Other | Admitting: Internal Medicine

## 2013-08-08 ENCOUNTER — Telehealth: Payer: Self-pay | Admitting: Internal Medicine

## 2013-08-08 MED ORDER — AZITHROMYCIN 250 MG PO TABS
250.0000 mg | ORAL_TABLET | Freq: Two times a day (BID) | ORAL | Status: DC
Start: 2013-08-08 — End: 2013-08-12

## 2013-08-08 NOTE — Telephone Encounter (Signed)
Acute bronchitis symptoms for less than 10 days are generally not helped by antibiotics.  Take over-the-counter expectorants and cough medications such as  Mucinex DM.  Call if there is no improvement in 5 to 7 days or if she has  developed worsening cough, fever, or new symptoms, such as shortness of breath or chest pain.

## 2013-08-08 NOTE — Telephone Encounter (Signed)
Pt was seen 1/22 pt states she is not any better! Has low grade fever yesterday, cough, doesn't feel well. Pt would like an antibiotic in order to get well.  Pharm [facility] Pt will come back in if necessary but would definitely like a  rx antibiotic/ pls advise

## 2013-08-08 NOTE — Telephone Encounter (Signed)
Please advise 

## 2013-08-08 NOTE — Telephone Encounter (Signed)
Discussed with Dr. Raliegh Ip, verbal order given for Zithromax 250 mg BID x 3 days.   Called pt told her she does not have to come in Rx sent to pharmacy for Zithromax 250 mg twice a day x 3 days. Pt verbalized understanding.

## 2013-08-08 NOTE — Telephone Encounter (Signed)
Spoke to pt asked her if symptoms have changed? Pt stated she had a fever Sat and Sunday yesterday it was 101, cough is the same. Told pt I will let Dr. Raliegh Ip know and get back to her. Pt verbalized understanding.

## 2013-08-11 ENCOUNTER — Telehealth: Payer: Self-pay | Admitting: Internal Medicine

## 2013-08-11 NOTE — Telephone Encounter (Signed)
Spoke to pt still c/o not feeling well and fever would like another round of antibiotics. Told pt Dr. Raliegh Ip is out of the office till Tues but I can schedule an appt with another provider tomorrow to be evaluated. Pt verbalized understanding and said yes. Appointment scheduled for tomorrow at 10:45 with Dr. Elease Hashimoto. Pt verbalized understanding.

## 2013-08-11 NOTE — Telephone Encounter (Signed)
Please call pt back about medication she picked up at pharmacy.  I asked her what was the name, she says she did not know. Please call pt back at your earliest convenience.

## 2013-08-12 ENCOUNTER — Ambulatory Visit (INDEPENDENT_AMBULATORY_CARE_PROVIDER_SITE_OTHER): Payer: Medicare Other | Admitting: Family Medicine

## 2013-08-12 ENCOUNTER — Encounter: Payer: Self-pay | Admitting: Family Medicine

## 2013-08-12 ENCOUNTER — Ambulatory Visit (INDEPENDENT_AMBULATORY_CARE_PROVIDER_SITE_OTHER)
Admission: RE | Admit: 2013-08-12 | Discharge: 2013-08-12 | Disposition: A | Discharge status: Home / Self Care | Payer: Medicare Other | Source: Ambulatory Visit | Attending: Family Medicine | Admitting: Family Medicine

## 2013-08-12 VITALS — BP 130/68 | HR 80 | Temp 97.5°F | Wt 148.0 lb

## 2013-08-12 DIAGNOSIS — R05 Cough: Secondary | ICD-10-CM

## 2013-08-12 DIAGNOSIS — R059 Cough, unspecified: Secondary | ICD-10-CM

## 2013-08-12 MED ORDER — LEVOFLOXACIN 500 MG PO TABS: 500.0000 mg | ORAL_TABLET | Freq: Every day | ORAL | Status: DC

## 2013-08-12 NOTE — Patient Instructions (Signed)
Follow up promptly for any fever or increased shortness of breath. 

## 2013-08-12 NOTE — Progress Notes (Signed)
Pre visit review using our clinic review tool, if applicable. No additional management support is needed unless otherwise documented below in the visit note. 

## 2013-08-12 NOTE — Progress Notes (Signed)
   Subjective:    Patient ID: Janice Small, female    DOB: 1927-08-22, 80 y.o.   MRN: [id number]  HPI Patient is seen for acute visit. Around the first of this month she developed cough and fever and went to urgent care Center was treated with amoxicillin. She felt better from around the 14th of 21st and then seemed to have a relapse. She had recurrence of productive cough and low-grade fever and was called in Zithromax recently. She's taking Mucinex and Tessalon and still severe productive cough at night. Cough productive of green sputum. No definite fevers at this time. No dyspnea. She has fatigue. No wheezing. Nonsmoker.   Review of Systems  Constitutional: Positive for fatigue. Negative for fever, chills and unexpected weight change.  HENT: Negative for sore throat.   Respiratory: Positive for cough. Negative for shortness of breath and wheezing.   Cardiovascular: Negative for chest pain.       Objective:   Physical Exam  Constitutional: She appears well-developed and well-nourished.  HENT:  Right Ear: External ear normal.  Left Ear: External ear normal.  Nose: Nose normal.  Mouth/Throat: Oropharynx is clear and moist.  Cardiovascular: Normal rate.   Pulmonary/Chest:  Few faint crackles right base which seemed to clear some with deep breathing. No wheezes. No retractions.  Musculoskeletal: She exhibits no edema.          Assessment & Plan:  Persistent cough. Suspect she's had viral process. We've recommended chest x-ray to further evaluate given her age, though she has relatively normal exam and is afebrile. We explained that most bronchitis is viral and average duration is about 3 weeks. Nevertheless,she insists on antibiotic.  We wrote prescription for Levaquin 500 milligrams once daily for 7 days if her chest x-ray suggested a pneumonia or symptoms worsen

## 2013-08-17 ENCOUNTER — Telehealth: Payer: Self-pay | Admitting: Internal Medicine

## 2013-08-17 NOTE — Telephone Encounter (Signed)
Relevant patient education mailed to patient.  

## 2013-09-22 ENCOUNTER — Other Ambulatory Visit: Payer: Self-pay

## 2013-09-22 DIAGNOSIS — Z1231 Encounter for screening mammogram for malignant neoplasm of breast: Secondary | ICD-10-CM

## 2013-10-07 ENCOUNTER — Ambulatory Visit
Admission: RE | Admit: 2013-10-07 | Discharge: 2013-10-07 | Disposition: A | Discharge status: Home / Self Care | Payer: 59 | Source: Ambulatory Visit

## 2013-10-07 DIAGNOSIS — Z1231 Encounter for screening mammogram for malignant neoplasm of breast: Secondary | ICD-10-CM

## 2013-12-16 ENCOUNTER — Ambulatory Visit (INDEPENDENT_AMBULATORY_CARE_PROVIDER_SITE_OTHER): Payer: PRIVATE HEALTH INSURANCE | Admitting: Internal Medicine

## 2013-12-16 ENCOUNTER — Encounter: Payer: Self-pay | Admitting: Internal Medicine

## 2013-12-16 VITALS — BP 120/70 | HR 52 | Temp 97.5°F | Resp 18 | Ht 60.5 in | Wt 149.0 lb

## 2013-12-16 DIAGNOSIS — E039 Hypothyroidism, unspecified: Secondary | ICD-10-CM

## 2013-12-16 DIAGNOSIS — Z86010 Personal history of colonic polyps: Secondary | ICD-10-CM

## 2013-12-16 DIAGNOSIS — I10 Essential (primary) hypertension: Secondary | ICD-10-CM

## 2013-12-16 NOTE — Patient Instructions (Signed)
Limit your sodium (Salt) intake  Please check your blood pressure on a regular basis.  If it is consistently greater than 150/90, please make an office appointment.  Take a calcium supplement, plus [phone] units of vitamin D    It is important that you exercise regularly, at least 20 minutes 3 to 4 times per week.  If you develop chest pain or shortness of breath seek  medical attention.  Return in 6 months for follow-up

## 2013-12-16 NOTE — Progress Notes (Signed)
Pre-visit discussion using our clinic review tool. No additional management support is needed unless otherwise documented below in the visit note.  

## 2013-12-16 NOTE — Progress Notes (Signed)
   Subjective:    Patient ID: Janice Small, female    DOB: January 07, 1928, 80 y.o.   MRN: [id number]  HPI  80 year old patient who is seen today for her biannual followup.  She has a history of hypertension, mild impaired glucose tolerance, and also personal history of colonic polyps.  She has treated hypothyroidism.  She has done quite well.  Her only real complaint is some right ankle pain due to arthritis.  She still gets to her health [facility].  The only frequent basis.  No cardiopulmonary complaints Laboratory studies from December of last year.  Reviewed  Check your blood sugars regularly.  If blood sugars are consistently below 70 or above 200 and please contact the office.     Review of Systems  Constitutional: Negative.   HENT: Negative for congestion, dental problem, hearing loss, rhinorrhea, sinus pressure, sore throat and tinnitus.   Eyes: Negative for pain, discharge and visual disturbance.  Respiratory: Negative for cough and shortness of breath.   Cardiovascular: Negative for chest pain, palpitations and leg swelling.  Gastrointestinal: Negative for nausea, vomiting, abdominal pain, diarrhea, constipation, blood in stool and abdominal distention.  Genitourinary: Negative for dysuria, urgency, frequency, hematuria, flank pain, vaginal bleeding, vaginal discharge, difficulty urinating, vaginal pain and pelvic pain.  Musculoskeletal: Positive for arthralgias and gait problem. Negative for joint swelling.  Skin: Negative for rash.  Neurological: Negative for dizziness, syncope, speech difficulty, weakness, numbness and headaches.  Hematological: Negative for adenopathy.  Psychiatric/Behavioral: Negative for behavioral problems, dysphoric mood and agitation. The patient is not nervous/anxious.        Objective:   Physical Exam  Constitutional: She is oriented to person, place, and time. She appears well-developed and well-nourished.  Blood pressure low normal  HENT:  Head:  Normocephalic.  Right Ear: External ear normal.  Left Ear: External ear normal.  Mouth/Throat: Oropharynx is clear and moist.  Eyes: Conjunctivae and EOM are normal. Pupils are equal, round, and reactive to light.  Neck: Normal range of motion. Neck supple. No thyromegaly present.  Cardiovascular: Normal rate, regular rhythm, normal heart sounds and intact distal pulses.   Pulmonary/Chest: Effort normal and breath sounds normal.  Abdominal: Soft. Bowel sounds are normal. She exhibits no mass. There is no tenderness.  Musculoskeletal: Normal range of motion.  Lymphadenopathy:    She has no cervical adenopathy.  Neurological: She is alert and oriented to person, place, and time.  Skin: Skin is warm and dry. No rash noted.  Psychiatric: She has a normal mood and affect. Her behavior is normal.          Assessment & Plan:   Hypertension, excellent control.  We'll continue present antihypertensive regimen.  Low salt diet encouraged Hypothyroidism.  We'll continue levothyroxine.  Check TSH in 6 months Osteoarthritis History of colonic polyps  CPX 6 months All medications refilled

## 2014-03-10 ENCOUNTER — Encounter: Payer: Self-pay | Admitting: Family Medicine

## 2014-03-10 ENCOUNTER — Ambulatory Visit (INDEPENDENT_AMBULATORY_CARE_PROVIDER_SITE_OTHER): Payer: PRIVATE HEALTH INSURANCE | Admitting: Family Medicine

## 2014-03-10 VITALS — BP 126/80 | HR 73 | Temp 98.4°F | Ht 60.05 in | Wt 150.5 lb

## 2014-03-10 DIAGNOSIS — R300 Dysuria: Secondary | ICD-10-CM

## 2014-03-10 LAB — POCT URINALYSIS DIPSTICK
Bilirubin, UA: NEGATIVE
Blood, UA: NEGATIVE
Glucose, UA: NEGATIVE
Ketones, UA: NEGATIVE
NITRITE UA: NEGATIVE
PH UA: 7
Protein, UA: NEGATIVE
Spec Grav, UA: <=1.005
Urobilinogen, UA: 0.2

## 2014-03-10 MED ORDER — CIPROFLOXACIN HCL 250 MG PO TABS: 250.0000 mg | ORAL_TABLET | Freq: Two times a day (BID) | ORAL | Status: DC

## 2014-03-10 NOTE — Patient Instructions (Signed)

## 2014-03-10 NOTE — Progress Notes (Signed)
No chief complaint on file.   HPI:  Dysuria: -started: yesterday -symptoms: mild burning with urination, mild increased urgency -denies: malaise, vomiting, nausea, vaginal bleeding, abd or pelvic pain, fevers, flank pain, hematuria, diarrhea or constipation -reports: has had so many UTIs - knows what they feel like and is sure she has a UTI  ROS: See pertinent positives and negatives per HPI.  Past Medical History  Diagnosis Date  . BREAST BIOPSY, HX OF 11/27/2006  . COLONIC POLYPS, HX OF 11/27/2006  . HEMORRHOIDS 01/17/2010  . HYPERTENSION 11/27/2006  . HYPOTHYROIDISM 11/27/2006  . MENOPAUSAL SYNDROME 11/27/2006    Past Surgical History  Procedure Laterality Date  . Appendectomy    . Abdominal hysterectomy    . Tonsillectomy    . Breast surgery      bx    Family History  Problem Relation Age of Onset  . Heart disease Father     History   Social History  . Marital Status: Married    Spouse Name: N/A    Number of Children: N/A  . Years of Education: N/A   Social History Main Topics  . Smoking status: Never Smoker   . Smokeless tobacco: Never Used  . Alcohol Use: Yes     Comment: rarely  . Drug Use: No  . Sexual Activity: None   Other Topics Concern  . None   Social History Narrative  . None    Current outpatient prescriptions:amLODipine (NORVASC) 10 MG tablet, Take 1 tablet (10 mg total) by mouth daily., Disp: 90 tablet, Rfl: 3;  Calcium Carbonate (CALCIUM 500 PO), Take 1 tablet by mouth daily.  , Disp: , Rfl: ;  furosemide (LASIX) 20 MG tablet, 1 tablet daily as needed for fluid retention, Disp: 90 tablet, Rfl: 3;  levothyroxine (SYNTHROID, LEVOTHROID) 75 MCG tablet, Take 1 tablet (75 mcg total) by mouth daily., Disp: 90 tablet, Rfl: 3 lisinopril (PRINIVIL,ZESTRIL) 10 MG tablet, Take 1 tablet (10 mg total) by mouth daily., Disp: 90 tablet, Rfl: 3;  metroNIDAZOLE (METROGEL) 0.75 % gel, Apply 1 application topically at bedtime. , Disp: , Rfl: ;  Multiple Vitamin  (MULTIVITAMIN) tablet, Take 1 tablet by mouth daily.  , Disp: , Rfl: ;  ciprofloxacin (CIPRO) 250 MG tablet, Take 1 tablet (250 mg total) by mouth 2 (two) times daily., Disp: 6 tablet, Rfl: 0  EXAM:  Filed Vitals:   03/10/14 1550  BP: 126/80  Pulse: 73  Temp: 98.4 F (36.9 C)    Body mass index is 29.35 kg/(m^2).  GENERAL: vitals reviewed and listed above, alert, oriented, appears well hydrated and in no acute distress  HEENT: atraumatic, conjunttiva clear, no obvious abnormalities on inspection of external nose and ears  NECK: no obvious masses on inspection  LUNGS: clear to auscultation bilaterally, no wheezes, rales or rhonchi, good air movement  CV: HRRR, no peripheral edema  ABD: BS+, soft, NTTP, no CVA tenderness  MS: moves all extremities without noticeable abnormality  PSYCH: pleasant and cooperative, no obvious depression or anxiety  ASSESSMENT AND PLAN:  Discussed the following assessment and plan:  Dysuria - Plan: POC Urinalysis Dipstick, ciprofloxacin (CIPRO) 250 MG tablet, CULTURE, URINE COMPREHENSIVE  -udip has a little pus, but otherwise ok -I advised we get a culture and hold on abx for now given fairly mild symptoms, but she is very worried going into the weekend and reports every time she has had these symptoms she had a UTI -after discussion risks/benefits she opted to take a printed  rx for cipro and if worse will start, otherwise if improving, mild symptoms will wait on culture results -Patient advised to return or notify a doctor immediately if symptoms worsen or persist or new concerns arise.  Patient Instructions  -As we discussed, we have prescribed a new medication for you at this appointment. We discussed the common and serious potential adverse effects of this medication and you can review these and more with the pharmacist when you pick up your medication.  Please follow the instructions for use carefully and notify us immediately if you have any  problems taking this medication.      Colin Benton R.

## 2014-03-10 NOTE — Progress Notes (Signed)
Pre visit review using our clinic review tool, if applicable. No additional management support is needed unless otherwise documented below in the visit note. 

## 2014-03-12 LAB — CULTURE, URINE COMPREHENSIVE
Colony Count: NO GROWTH
Organism ID, Bacteria: NO GROWTH

## 2014-05-15 ENCOUNTER — Other Ambulatory Visit: Payer: Self-pay | Admitting: Internal Medicine

## 2014-05-31 ENCOUNTER — Ambulatory Visit: Payer: PRIVATE HEALTH INSURANCE

## 2014-05-31 ENCOUNTER — Ambulatory Visit (INDEPENDENT_AMBULATORY_CARE_PROVIDER_SITE_OTHER): Payer: PRIVATE HEALTH INSURANCE

## 2014-05-31 DIAGNOSIS — Z23 Encounter for immunization: Secondary | ICD-10-CM

## 2014-06-26 ENCOUNTER — Encounter: Payer: PRIVATE HEALTH INSURANCE | Admitting: Internal Medicine

## 2014-07-11 ENCOUNTER — Other Ambulatory Visit: Payer: Self-pay | Admitting: Internal Medicine

## 2014-07-11 ENCOUNTER — Other Ambulatory Visit: Payer: Self-pay | Admitting: *Deleted

## 2014-07-11 MED ORDER — LISINOPRIL 10 MG PO TABS: 10.0000 mg | ORAL_TABLET | Freq: Every day | ORAL | Status: DC

## 2014-08-10 ENCOUNTER — Encounter: Payer: PRIVATE HEALTH INSURANCE | Admitting: Internal Medicine

## 2014-09-15 ENCOUNTER — Other Ambulatory Visit: Payer: Self-pay

## 2014-09-15 DIAGNOSIS — Z1231 Encounter for screening mammogram for malignant neoplasm of breast: Secondary | ICD-10-CM

## 2014-09-29 ENCOUNTER — Ambulatory Visit (INDEPENDENT_AMBULATORY_CARE_PROVIDER_SITE_OTHER): Payer: Medicare Other | Admitting: Internal Medicine

## 2014-09-29 ENCOUNTER — Encounter: Payer: Self-pay | Admitting: Internal Medicine

## 2014-09-29 VITALS — BP 140/80 | HR 73 | Temp 98.2°F | Resp 20 | Ht 60.25 in | Wt 150.0 lb

## 2014-09-29 DIAGNOSIS — Z0000 Encounter for general adult medical examination without abnormal findings: Secondary | ICD-10-CM

## 2014-09-29 DIAGNOSIS — I10 Essential (primary) hypertension: Secondary | ICD-10-CM | POA: Diagnosis not present

## 2014-09-29 DIAGNOSIS — E785 Hyperlipidemia, unspecified: Secondary | ICD-10-CM

## 2014-09-29 DIAGNOSIS — E039 Hypothyroidism, unspecified: Secondary | ICD-10-CM | POA: Diagnosis not present

## 2014-09-29 DIAGNOSIS — Z86010 Personal history of colonic polyps: Secondary | ICD-10-CM

## 2014-09-29 LAB — CBC WITH DIFFERENTIAL/PLATELET
Basophils Absolute: 0 10*3/uL (ref 0.0–0.1)
Basophils Relative: 0.5 % (ref 0.0–3.0)
Eosinophils Absolute: 0.1 10*3/uL (ref 0.0–0.7)
Eosinophils Relative: 1.8 % (ref 0.0–5.0)
HCT: 40.2 % (ref 36.0–46.0)
Hemoglobin: 13.6 g/dL (ref 12.0–15.0)
LYMPHS PCT: 21.7 % (ref 12.0–46.0)
Lymphs Abs: 1 10*3/uL (ref 0.7–4.0)
MCHC: 33.9 g/dL (ref 30.0–36.0)
MCV: 91.9 fl (ref 78.0–100.0)
MONOS PCT: 9 % (ref 3.0–12.0)
Monocytes Absolute: 0.4 10*3/uL (ref 0.1–1.0)
Neutro Abs: 3 10*3/uL (ref 1.4–7.7)
Neutrophils Relative %: 67 % (ref 43.0–77.0)
PLATELETS: 152 10*3/uL (ref 150.0–400.0)
RBC: 4.37 Mil/uL (ref 3.87–5.11)
RDW: 13.8 % (ref 11.5–15.5)
WBC: 4.5 10*3/uL (ref 4.0–10.5)

## 2014-09-29 LAB — COMPREHENSIVE METABOLIC PANEL
ALK PHOS: 81 U/L (ref 39–117)
ALT: 14 U/L (ref 0–35)
AST: 10 U/L (ref 0–37)
Albumin: 4.5 g/dL (ref 3.5–5.2)
BILIRUBIN TOTAL: 0.7 mg/dL (ref 0.2–1.2)
BUN: 26 mg/dL — ABNORMAL HIGH (ref 6–23)
CO2: 30 mEq/L (ref 19–32)
CREATININE: 0.79 mg/dL (ref 0.40–1.20)
Calcium: 10.8 mg/dL — ABNORMAL HIGH (ref 8.4–10.5)
Chloride: 107 mEq/L (ref 96–112)
GFR: 73.54 mL/min (ref >60.00)
GLUCOSE: 98 mg/dL (ref 70–99)
Potassium: 4.2 mEq/L (ref 3.5–5.1)
Sodium: 142 mEq/L (ref 135–145)
Total Protein: 6.8 g/dL (ref 6.0–8.3)

## 2014-09-29 LAB — LIPID PANEL
Cholesterol: 187 mg/dL (ref 0–200)
HDL: 88.8 mg/dL (ref >39.00)
LDL CALC: 83 mg/dL (ref 0–99)
NonHDL: 98.2
Total CHOL/HDL Ratio: 2
Triglycerides: 77 mg/dL (ref 0.0–149.0)
VLDL: 15.4 mg/dL (ref 0.0–40.0)

## 2014-09-29 LAB — TSH: TSH: 1.69 u[IU]/mL (ref 0.35–4.50)

## 2014-09-29 NOTE — Progress Notes (Signed)
Pre visit review using our clinic review tool, if applicable. No additional management support is needed unless otherwise documented below in the visit note. 

## 2014-09-29 NOTE — Patient Instructions (Signed)
Limit your sodium (Salt) intake    It is important that you exercise regularly, at least 20 minutes 3 to 4 times per week.  If you develop chest pain or shortness of breath seek  medical attention.  Please check your blood pressure on a regular basis.  If it is consistently greater than 150/90, please make an office appointment.  Health Maintenance Adopting a healthy lifestyle and getting preventive care can go a long way to promote health and wellness. Talk with your health care provider about what schedule of regular examinations is right for you. This is a good chance for you to check in with your provider about disease prevention and staying healthy. In between checkups, there are plenty of things you can do on your own. Experts have done a lot of research about which lifestyle changes and preventive measures are most likely to keep you healthy. Ask your health care provider for more information. WEIGHT AND DIET  Eat a healthy diet  Be sure to include plenty of vegetables, fruits, low-fat dairy products, and lean protein.  Do not eat a lot of foods high in solid fats, added sugars, or salt.  Get regular exercise. This is one of the most important things you can do for your health.  Most adults should exercise for at least 150 minutes each week. The exercise should increase your heart rate and make you sweat (moderate-intensity exercise).  Most adults should also do strengthening exercises at least twice a week. This is in addition to the moderate-intensity exercise.  Maintain a healthy weight  Body mass index (BMI) is a measurement that can be used to identify possible weight problems. It estimates body fat based on height and weight. Your health care provider can help determine your BMI and help you achieve or maintain a healthy weight.  For females 20 years of age and older:   A BMI below 18.5 is considered underweight.  A BMI of 18.5 to 24.9 is normal.  A BMI of 25 to 29.9 is  considered overweight.  A BMI of 30 and above is considered obese.  Watch levels of cholesterol and blood lipids  You should start having your blood tested for lipids and cholesterol at 79 years of age, then have this test every 5 years.  You may need to have your cholesterol levels checked more often if:  Your lipid or cholesterol levels are high.  You are older than 79 years of age.  You are at high risk for heart disease.  CANCER SCREENING   Lung Cancer  Lung cancer screening is recommended for adults 55-80 years old who are at high risk for lung cancer because of a history of smoking.  A yearly low-dose CT scan of the lungs is recommended for people who:  Currently smoke.  Have quit within the past 15 years.  Have at least a 30-pack-year history of smoking. A pack year is smoking an average of one pack of cigarettes a day for 1 year.  Yearly screening should continue until it has been 15 years since you quit.  Yearly screening should stop if you develop a health problem that would prevent you from having lung cancer treatment.  Breast Cancer  Practice breast self-awareness. This means understanding how your breasts normally appear and feel.  It also means doing regular breast self-exams. Let your health care provider know about any changes, no matter how small.  If you are in your 20s or 30s, you should have a   clinical breast exam (CBE) by a health care provider every 1-3 years as part of a regular health exam.  If you are 40 or older, have a CBE every year. Also consider having a breast X-ray (mammogram) every year.  If you have a family history of breast cancer, talk to your health care provider about genetic screening.  If you are at high risk for breast cancer, talk to your health care provider about having an MRI and a mammogram every year.  Breast cancer gene (BRCA) assessment is recommended for women who have family members with BRCA-related cancers.  BRCA-related cancers include:  Breast.  Ovarian.  Tubal.  Peritoneal cancers.  Results of the assessment will determine the need for genetic counseling and BRCA1 and BRCA2 testing. Cervical Cancer Routine pelvic examinations to screen for cervical cancer are no longer recommended for nonpregnant women who are considered low risk for cancer of the pelvic organs (ovaries, uterus, and vagina) and who do not have symptoms. A pelvic examination may be necessary if you have symptoms including those associated with pelvic infections. Ask your health care provider if a screening pelvic exam is right for you.   The Pap test is the screening test for cervical cancer for women who are considered at risk.  If you had a hysterectomy for a problem that was not cancer or a condition that could lead to cancer, then you no longer need Pap tests.  If you are older than 65 years, and you have had normal Pap tests for the past 10 years, you no longer need to have Pap tests.  If you have had past treatment for cervical cancer or a condition that could lead to cancer, you need Pap tests and screening for cancer for at least 20 years after your treatment.  If you no longer get a Pap test, assess your risk factors if they change (such as having a new sexual partner). This can affect whether you should start being screened again.  Some women have medical problems that increase their chance of getting cervical cancer. If this is the case for you, your health care provider may recommend more frequent screening and Pap tests.  The human papillomavirus (HPV) test is another test that may be used for cervical cancer screening. The HPV test looks for the virus that can cause cell changes in the cervix. The cells collected during the Pap test can be tested for HPV.  The HPV test can be used to screen women 30 years of age and older. Getting tested for HPV can extend the interval between normal Pap tests from three to  five years.  An HPV test also should be used to screen women of any age who have unclear Pap test results.  After 79 years of age, women should have HPV testing as often as Pap tests.  Colorectal Cancer  This type of cancer can be detected and often prevented.  Routine colorectal cancer screening usually begins at 79 years of age and continues through 79 years of age.  Your health care provider may recommend screening at an earlier age if you have risk factors for colon cancer.  Your health care provider may also recommend using home test kits to check for hidden blood in the stool.  A small camera at the end of a tube can be used to examine your colon directly (sigmoidoscopy or colonoscopy). This is done to check for the earliest forms of colorectal cancer.  Routine screening usually begins at   age 5.  Direct examination of the colon should be repeated every 5-10 years through 79 years of age. However, you may need to be screened more often if early forms of precancerous polyps or small growths are found. Skin Cancer  Check your skin from head to toe regularly.  Tell your health care provider about any new moles or changes in moles, especially if there is a change in a mole's shape or color.  Also tell your health care provider if you have a mole that is larger than the size of a pencil eraser.  Always use sunscreen. Apply sunscreen liberally and repeatedly throughout the day.  Protect yourself by wearing long sleeves, pants, a wide-brimmed hat, and sunglasses whenever you are outside. HEART DISEASE, DIABETES, AND HIGH BLOOD PRESSURE   Have your blood pressure checked at least every 1-2 years. High blood pressure causes heart disease and increases the risk of stroke.  If you are between 70 years and 61 years old, ask your health care provider if you should take aspirin to prevent strokes.  Have regular diabetes screenings. This involves taking a blood sample to check your  fasting blood sugar level.  If you are at a normal weight and have a low risk for diabetes, have this test once every three years after 79 years of age.  If you are overweight and have a high risk for diabetes, consider being tested at a younger age or more often. PREVENTING INFECTION  Hepatitis B  If you have a higher risk for hepatitis B, you should be screened for this virus. You are considered at high risk for hepatitis B if:  You were born in a country where hepatitis B is common. Ask your health care provider which countries are considered high risk.  Your parents were born in a high-risk country, and you have not been immunized against hepatitis B (hepatitis B vaccine).  You have HIV or AIDS.  You use needles to inject street drugs.  You live with someone who has hepatitis B.  You have had sex with someone who has hepatitis B.  You get hemodialysis treatment.  You take certain medicines for conditions, including cancer, organ transplantation, and autoimmune conditions. Hepatitis C  Blood testing is recommended for:  Everyone born from [birth year] through 1963.  Anyone with known risk factors for hepatitis C. Sexually transmitted infections (STIs)  You should be screened for sexually transmitted infections (STIs) including gonorrhea and chlamydia if:  You are sexually active and are younger than 79 years of age.  You are older than 79 years of age and your health care provider tells you that you are at risk for this type of infection.  Your sexual activity has changed since you were last screened and you are at an increased risk for chlamydia or gonorrhea. Ask your health care provider if you are at risk.  If you do not have HIV, but are at risk, it may be recommended that you take a prescription medicine daily to prevent HIV infection. This is called pre-exposure prophylaxis (PrEP). You are considered at risk if:  You are sexually active and do not regularly use condoms or  know the HIV status of your partner(s).  You take drugs by injection.  You are sexually active with a partner who has HIV. Talk with your health care provider about whether you are at high risk of being infected with HIV. If you choose to begin PrEP, you should first be tested for HIV. You  should then be tested every 3 months for as long as you are taking PrEP.  PREGNANCY   If you are premenopausal and you may become pregnant, ask your health care provider about preconception counseling.  If you may become pregnant, take 400 to 800 micrograms (mcg) of folic acid every day.  If you want to prevent pregnancy, talk to your health care provider about birth control (contraception). OSTEOPOROSIS AND MENOPAUSE   Osteoporosis is a disease in which the bones lose minerals and strength with aging. This can result in serious bone fractures. Your risk for osteoporosis can be identified using a bone density scan.  If you are 73 years of age or older, or if you are at risk for osteoporosis and fractures, ask your health care provider if you should be screened.  Ask your health care provider whether you should take a calcium or vitamin D supplement to lower your risk for osteoporosis.  Menopause may have certain physical symptoms and risks.  Hormone replacement therapy may reduce some of these symptoms and risks. Talk to your health care provider about whether hormone replacement therapy is right for you.  HOME CARE INSTRUCTIONS   Schedule regular health, dental, and eye exams.  Stay current with your immunizations.   Do not use any tobacco products including cigarettes, chewing tobacco, or electronic cigarettes.  If you are pregnant, do not drink alcohol.  If you are breastfeeding, limit how much and how often you drink alcohol.  Limit alcohol intake to no more than 1 drink per day for nonpregnant women. One drink equals 12 ounces of beer, 5 ounces of wine, or 1 ounces of hard liquor.  Do  not use street drugs.  Do not share needles.  Ask your health care provider for help if you need support or information about quitting drugs.  Tell your health care provider if you often feel depressed.  Tell your health care provider if you have ever been abused or do not feel safe at home. Document Released: 01/13/2011 Document Revised: 11/14/2013 Document Reviewed: 06/01/2013 [facility] Patient Information 2015 [place], [place]. This information is not intended to replace advice given to you by your health care provider. Make sure you discuss any questions you have with your health care provider.

## 2014-09-29 NOTE — Progress Notes (Signed)
Subjective:    Patient ID: Janice Small, female    DOB: 12/14/1927, 81 y.o.   MRN: [id number]  HPI Pre-visit discussion using our clinic review tool. No additional management support is needed unless otherwise documented below in the visit note.    History of Present Illness:   81   year-old patient who is seen today for a wellness exam.  Medical problems include hypertension, hypothyroidism, and a history of colonic polyps. Last colonoscopy was 2004. She is doing quite well. She has a history of mild stable hypercalcemia while on thiazide diuretics. Medical regimen includes calcium and vitamin D supplementation as well as thiazide diuretic for her hypertension. Her only complaint today is some right knee pain.  She is followed by [facility]  Here for Medicare AWV:   1. Risk factors based on Past M, S, F history: Cardio vascular risk factors include hypertension, and a family history of coronary artery disease  2. Physical Activities: remains quite active without limiting factors. Does have some mild chronic low back pain  3. Depression/mood: no history of depression or mood disorder  4. Hearing: no deficits  5. ADL's: independent in all aspects of daily living  6. Fall Risk: low  7. Home Safety: no problems identified  8. Height, weight, &visual acuity:height and weight stable. No change in visual acuity .  Sees Dr. Bing Plume annually in the summer 9. Counseling: heart healthy diet regular. Exercise, all encouraged  10. Labs ordered based on risk factors: laboratory profile, including lipid profile, and TSH will be reviewed  11. Referral Coordination- follow-up ophthalmology.  The summer 12. Care Plan- follow-up lab  13. Cognitive Assessment- alert and oriented, with normal affect. No cognitive dysfunction, or memory impairment. Handles all executive functioning of household  14.  Preventive services will include annual clinical exam with screening lab.  Annual eye  examinations recommended.  Patient was provided with a written and personalized care plan 15.   Provider list update includes ophthalmology and primary care  Allergies:  1) Codeine Phosphate (Codeine Phosphate)   Past History:   Colonic polyps, hx of  Hypertension  Hypothyroidism  external hemorrhoids   Past Surgical History:   Appendectomy 1981  Hysterectomy 1981  Tonsillectomy  gravida 3, para 3, abortus zero  history breast biopsy  colonoscopy September 2004   Family History:   both her parents died at 8, father from coronary artery disease,  mother from uterine cancer.  No siblings   Social History:   Married  Never Smoked  husband status post CABG in 2010-history of dementia  3 sons, 5 grandchildren    Review of Systems  Constitutional: Negative for fever, appetite change, fatigue and unexpected weight change.  HENT: Negative for congestion, dental problem, ear pain, hearing loss, mouth sores, nosebleeds, sinus pressure, sore throat, tinnitus, trouble swallowing and voice change.   Eyes: Negative for photophobia, pain, redness and visual disturbance.  Respiratory: Negative for cough, chest tightness and shortness of breath.   Cardiovascular: Negative for chest pain, palpitations and leg swelling.  Gastrointestinal: Negative for nausea, vomiting, abdominal pain, diarrhea, constipation, blood in stool, abdominal distention and rectal pain.  Genitourinary: Negative for dysuria, urgency, frequency, hematuria, flank pain, vaginal bleeding, vaginal discharge, difficulty urinating, genital sores, vaginal pain, menstrual problem and pelvic pain.  Musculoskeletal: Negative for back pain, arthralgias and neck stiffness.  Skin: Negative for rash.  Neurological: Negative for dizziness, syncope, speech difficulty, weakness, light-headedness, numbness and headaches.  Hematological: Negative for adenopathy. Does not bruise/bleed  easily.  Psychiatric/Behavioral: Negative for  suicidal ideas, behavioral problems, self-injury, dysphoric mood and agitation. The patient is not nervous/anxious.        Objective:   Physical Exam  Constitutional: She is oriented to person, place, and time. She appears well-developed and well-nourished.  HENT:  Head: Normocephalic and atraumatic.  Right Ear: External ear normal.  Left Ear: External ear normal.  Mouth/Throat: Oropharynx is clear and moist.  Eyes: Conjunctivae and EOM are normal.  Neck: Normal range of motion. Neck supple. No JVD present. No thyromegaly present.  Cardiovascular: Normal rate, regular rhythm, normal heart sounds and intact distal pulses.   No murmur heard. Pulmonary/Chest: Effort normal. She has no wheezes. She has no rales.  Abdominal: Soft. Bowel sounds are normal. She exhibits no distension and no mass. There is no tenderness. There is no rebound and no guarding.  Musculoskeletal: Normal range of motion. She exhibits no edema or tenderness.  Neurological: She is alert and oriented to person, place, and time. She has normal reflexes. No cranial nerve deficit. She exhibits normal muscle tone. Coordination normal.  Skin: Skin is warm and dry. No rash noted.  Psychiatric: She has a normal mood and affect. Her behavior is normal.          Assessment & Plan:   Preventive health History of colonic polyps. We'll check a CBC  Mild hypercalcemia. Probably related to the diuretic therapy.  Recheck calcium today off diuretic therapy Hypertension.  Well-controlled History of colonic polyps  Recheck 6 months Low-salt diet recommended Regular exercise regimen encouraged Heart healthy diet.  Also encouraged No change in medical regimen

## 2014-10-10 ENCOUNTER — Ambulatory Visit
Admission: RE | Admit: 2014-10-10 | Discharge: 2014-10-10 | Disposition: A | Discharge status: Home / Self Care | Payer: Medicare Other | Source: Ambulatory Visit

## 2014-10-10 DIAGNOSIS — Z1231 Encounter for screening mammogram for malignant neoplasm of breast: Secondary | ICD-10-CM

## 2014-11-20 ENCOUNTER — Telehealth: Payer: Self-pay | Admitting: Internal Medicine

## 2014-11-20 MED ORDER — LISINOPRIL 10 MG PO TABS: 10.0000 mg | ORAL_TABLET | Freq: Every day | ORAL | Status: DC

## 2014-11-20 MED ORDER — LEVOTHYROXINE SODIUM 75 MCG PO TABS: 75.0000 ug | ORAL_TABLET | Freq: Every day | ORAL | Status: DC

## 2014-11-20 MED ORDER — AMLODIPINE BESYLATE 10 MG PO TABS: 10.0000 mg | ORAL_TABLET | Freq: Every day | ORAL | Status: DC

## 2014-11-20 MED ORDER — FUROSEMIDE 20 MG PO TABS: ORAL_TABLET | ORAL | Status: DC

## 2014-11-20 NOTE — Telephone Encounter (Signed)
Patient states her re-fills did not go to [place], [place] after her last office visit.  I asked the name of the medications she needed sent and she said Butch Penny and Dr. Raliegh Ip should already know.

## 2014-11-20 NOTE — Telephone Encounter (Signed)
Pt notified Rx's were sent again to [facility], we did send them in Dec not sure why you do not have refills. Pt verbalized understanding.

## 2014-11-27 ENCOUNTER — Telehealth: Payer: Self-pay | Admitting: Internal Medicine

## 2014-11-27 NOTE — Telephone Encounter (Signed)
Spoke to pt, told her Rx's were filled for 6 months because you are due to come back in 6 months. Pt verbalized understanding and stated she is not taking Furosemide to take off her list. Told pt okay will remove from list. Pt verbalized understanding.

## 2014-11-27 NOTE — Telephone Encounter (Signed)
Pt would like to know why her meds were not refilled for a year. Pt only received a 6 mo supply and is concerned something has changed.Marland Kitchen                                                                                                                                                                                                                                        e

## 2015-01-01 ENCOUNTER — Ambulatory Visit (INDEPENDENT_AMBULATORY_CARE_PROVIDER_SITE_OTHER): Payer: Medicare Other | Admitting: Internal Medicine

## 2015-01-01 ENCOUNTER — Encounter: Payer: Self-pay | Admitting: Internal Medicine

## 2015-01-01 ENCOUNTER — Telehealth: Payer: Self-pay | Admitting: Internal Medicine

## 2015-01-01 VITALS — BP 160/80 | HR 97 | Temp 98.1°F | Resp 18 | Ht 60.25 in | Wt 146.0 lb

## 2015-01-01 DIAGNOSIS — K649 Unspecified hemorrhoids: Secondary | ICD-10-CM | POA: Diagnosis not present

## 2015-01-01 DIAGNOSIS — K625 Hemorrhage of anus and rectum: Secondary | ICD-10-CM

## 2015-01-01 DIAGNOSIS — R319 Hematuria, unspecified: Secondary | ICD-10-CM | POA: Diagnosis not present

## 2015-01-01 DIAGNOSIS — I10 Essential (primary) hypertension: Secondary | ICD-10-CM | POA: Diagnosis not present

## 2015-01-01 LAB — POCT URINALYSIS DIPSTICK
Bilirubin, UA: NEGATIVE
Blood, UA: NEGATIVE
GLUCOSE UA: NEGATIVE
Ketones, UA: NEGATIVE
NITRITE UA: NEGATIVE
PH UA: 6
Protein, UA: NEGATIVE
Spec Grav, UA: 1.015
UROBILINOGEN UA: 0.2

## 2015-01-01 NOTE — Patient Instructions (Signed)
Report any further rectal bleeding  Limit your sodium (Salt) intake  Please check your blood pressure on a regular basis.  If it is consistently greater than 150/90, please make an office appointment.  Return in 6 months for follow-up

## 2015-01-01 NOTE — Progress Notes (Signed)
Subjective:    Patient ID: Janice Small, female    DOB: [birth year], 81 y.o.   MRN: [id number]  HPI 81 year old patient who has a history of essential hypertension.  She also has a history of colonic polyps as well as hemorrhoids.  She states she has a long history of IBS and is constipation prone. She felt recently that she has had some blood in the urine.  She has noted the discoloration for couple days.  Urinalysis was reviewed today and was normal without hematuria She also has some concern about some blood in the stool.  She denies any blood on the tissue paper.  Her bowel movements have been normal for her, although stool, perhaps slightly darker. Denies any abdominal pain  Past Medical History  Diagnosis Date  . BREAST BIOPSY, HX OF 11/27/2006  . COLONIC POLYPS, HX OF 11/27/2006  . HEMORRHOIDS 01/17/2010  . HYPERTENSION 11/27/2006  . HYPOTHYROIDISM 11/27/2006  . MENOPAUSAL SYNDROME 11/27/2006    History   Social History  . Marital Status: Married    Spouse Name: N/A  . Number of Children: N/A  . Years of Education: N/A   Occupational History  . Not on file.   Social History Main Topics  . Smoking status: Never Smoker   . Smokeless tobacco: Never Used  . Alcohol Use: Yes     Comment: rarely  . Drug Use: No  . Sexual Activity: Not on file   Other Topics Concern  . Not on file   Social History Narrative    Past Surgical History  Procedure Laterality Date  . Appendectomy    . Abdominal hysterectomy    . Tonsillectomy    . Breast surgery      bx    Family History  Problem Relation Age of Onset  . Heart disease Father     Allergies  Allergen Reactions  . Codeine Phosphate     REACTION: unspecified    Current Outpatient Prescriptions on File Prior to Visit  Medication Sig Dispense Refill  . amLODipine (NORVASC) 10 MG tablet Take 1 tablet (10 mg total) by mouth daily. 90 tablet 1  . Calcium Carbonate (CALCIUM 500 PO) Take 1 tablet by mouth daily.      Marland Kitchen  levothyroxine (SYNTHROID, LEVOTHROID) 75 MCG tablet Take 1 tablet (75 mcg total) by mouth daily. 90 tablet 1  . lisinopril (PRINIVIL,ZESTRIL) 10 MG tablet Take 1 tablet (10 mg total) by mouth daily. 90 tablet 1  . metroNIDAZOLE (METROGEL) 0.75 % gel Apply 1 application topically at bedtime.     . Multiple Vitamin (MULTIVITAMIN) tablet Take 1 tablet by mouth daily.       No current facility-administered medications on file prior to visit.    BP 160/80 mmHg  Pulse 97  Temp(Src) 98.1 F (36.7 C) (Oral)  Resp 18  Ht 5' 0.25" (1.53 m)  Wt 146 lb (66.225 kg)  BMI 28.29 kg/m2  SpO2 97%      Review of Systems  Constitutional: Negative.   HENT: Negative for congestion, dental problem, hearing loss, rhinorrhea, sinus pressure, sore throat and tinnitus.   Eyes: Negative for pain, discharge and visual disturbance.  Respiratory: Negative for cough and shortness of breath.   Cardiovascular: Negative for chest pain, palpitations and leg swelling.  Gastrointestinal: Positive for blood in stool. Negative for nausea, vomiting, abdominal pain, diarrhea, constipation and abdominal distention.  Genitourinary: Positive for hematuria. Negative for dysuria, urgency, frequency, flank pain, vaginal bleeding, vaginal discharge, difficulty urinating,  vaginal pain and pelvic pain.  Musculoskeletal: Negative for joint swelling, arthralgias and gait problem.  Skin: Negative for rash.  Neurological: Negative for dizziness, syncope, speech difficulty, weakness, numbness and headaches.  Hematological: Negative for adenopathy.  Psychiatric/Behavioral: Negative for behavioral problems, dysphoric mood and agitation. The patient is not nervous/anxious.        Objective:   Physical Exam  Constitutional: She appears well-developed and well-nourished. No distress.  Blood pressure controlled Repeat 1:30 over 80  Abdominal: Soft. Bowel sounds are normal. She exhibits no distension and no mass. There is no  tenderness. There is no rebound and no guarding.  Rectal examination revealed some hemorrhoidal tags but no active lesion.  Internal examination normal.  Stool hematest negative          Assessment & Plan:   History of reddish colored urine and possible blood in the stool.  Clinical exam unremarkable.  Urinalysis normal.  Will check a CBC and observe.  Possibly some minor hemorrhoidal bleeding Essential hypertension, stable External hemorrhoids History colonic polyps

## 2015-01-01 NOTE — Telephone Encounter (Signed)
appt scheduled for 01/01/15 at 4:15

## 2015-01-01 NOTE — Progress Notes (Signed)
Pre visit review using our clinic review tool, if applicable. No additional management support is needed unless otherwise documented below in the visit note. 

## 2015-01-01 NOTE — Telephone Encounter (Signed)
[facility] [place]    --------------------------------------------------------------------------------   Patient Name: Janice Small  Gender: Female  DOB: Oct 03, 1927   Age: 81 Y 79 M 14 D  Return Phone Number: [phone] (Primary)  Address:     City/State/Zip:  [place]     Client [facility] - Client  Client [facility]  Physician Simonne Martinet   Contact Type Call  Call Type Triage / Clinical  Relationship To Patient Self  Appointment Disposition EMR Appointment Scheduled  Info pasted into Epic Yes  Return Phone Number [phone] (Primary)  Chief Complaint Blood In Stool  Initial Comment Caller states she went to eye doctor for infection in eye was given medicaiton. Stools and urine are red colored   GOTO Facility Not Listed azithromycin appointment at 4:15 today  PreDisposition Call Doctor       Nurse Assessment  Nurse: Buck Mam, RN, Trish Date/Time (Eastern Time): 01/01/2015 9:30:20 AM  Confirm and document reason for call. If symptomatic, describe symptoms. ---Patient is calling for self and caller states she went to eye doctor for infection in eye was given medication. Stools and urine are red colored. Z pack was given and has taken 4 days.    Has the patient traveled out of the country within the last 30 days? ---No    Does the patient require triage? ---Yes    Related visit to physician within the last 2 weeks? ---Yes      eye doctor  Does the PT have any chronic conditions? (i.e. diabetes, asthma, etc.) ---Yes    List chronic conditions. ---synthroid and hypertension           Guidelines          Guideline Title Affirmed Question Affirmed Notes Nurse Date/Time (Eastern Time)  Urine - Blood In Blood in urine, but all triage questions negative (Exception: could be normal menstrual bleeding)    Buck Mam, RN, Trish 01/01/2015  9:34:18 AM    Disp. Time Eilene Ghazi Time) Disposition Final User         01/01/2015 9:36:18 AM See Physician within 24 Hours Yes Buck Mam, RN, Pensions consultant Understands: Yes  Disagree/Comply: Comply       Care Advice Given Per Guideline        SEE PHYSICIAN WITHIN 24 HOURS: CALL BACK IF: * Fever or pain occurs * You become worse. CARE ADVICE given per Urine, Blood In (Adult) guideline.    After Care Instructions Given        Call Event Type User Date / Time Description        --------------------------------------------------------------------------------            Referrals  REFERRED TO PCP OFFICE

## 2015-01-02 LAB — CBC WITH DIFFERENTIAL/PLATELET
Basophils Absolute: 0 10*3/uL (ref 0.0–0.1)
Basophils Relative: 1 % (ref 0.0–3.0)
EOS PCT: 0.9 % (ref 0.0–5.0)
Eosinophils Absolute: 0 10*3/uL (ref 0.0–0.7)
HCT: 40.1 % (ref 36.0–46.0)
Hemoglobin: 13.4 g/dL (ref 12.0–15.0)
LYMPHS PCT: 20.8 % (ref 12.0–46.0)
Lymphs Abs: 1.1 10*3/uL (ref 0.7–4.0)
MCHC: 33.3 g/dL (ref 30.0–36.0)
MCV: 94.3 fl (ref 78.0–100.0)
MONO ABS: 0.2 10*3/uL (ref 0.1–1.0)
MONOS PCT: 4.9 % (ref 3.0–12.0)
NEUTROS PCT: 72.4 % (ref 43.0–77.0)
Neutro Abs: 3.7 10*3/uL (ref 1.4–7.7)
Platelets: 137 10*3/uL — ABNORMAL LOW (ref 150.0–400.0)
RBC: 4.26 Mil/uL (ref 3.87–5.11)
RDW: 13.4 % (ref 11.5–15.5)
WBC: 5.1 10*3/uL (ref 4.0–10.5)

## 2015-01-03 ENCOUNTER — Telehealth: Payer: Self-pay | Admitting: Internal Medicine

## 2015-01-03 NOTE — Telephone Encounter (Signed)
Please call/notify patient that lab/test/procedure is normal Cholesterol 187

## 2015-01-03 NOTE — Telephone Encounter (Signed)
Please review lab results from 6/20 and advise.

## 2015-01-03 NOTE — Telephone Encounter (Signed)
Pt would like a call back concerning her lab work

## 2015-01-04 NOTE — Telephone Encounter (Signed)
Spoke to pt told her lab results were normal. Pt verbalized understanding.

## 2015-02-19 ENCOUNTER — Other Ambulatory Visit: Payer: Self-pay | Admitting: Internal Medicine

## 2015-04-02 ENCOUNTER — Encounter: Payer: Self-pay | Admitting: Internal Medicine

## 2015-04-02 ENCOUNTER — Ambulatory Visit (INDEPENDENT_AMBULATORY_CARE_PROVIDER_SITE_OTHER): Payer: Medicare Other | Admitting: Internal Medicine

## 2015-04-02 VITALS — BP 130/68 | HR 81 | Temp 98.0°F | Resp 18 | Ht 60.25 in | Wt 146.0 lb

## 2015-04-02 DIAGNOSIS — Z23 Encounter for immunization: Secondary | ICD-10-CM

## 2015-04-02 DIAGNOSIS — E039 Hypothyroidism, unspecified: Secondary | ICD-10-CM

## 2015-04-02 DIAGNOSIS — I10 Essential (primary) hypertension: Secondary | ICD-10-CM

## 2015-04-02 MED ORDER — AMLODIPINE BESYLATE 10 MG PO TABS: 10.0000 mg | ORAL_TABLET | Freq: Every day | ORAL | Status: DC

## 2015-04-02 MED ORDER — LEVOTHYROXINE SODIUM 75 MCG PO TABS: 75.0000 ug | ORAL_TABLET | Freq: Every day | ORAL | Status: DC

## 2015-04-02 MED ORDER — LISINOPRIL 10 MG PO TABS: 10.0000 mg | ORAL_TABLET | Freq: Every day | ORAL | Status: DC

## 2015-04-02 NOTE — Patient Instructions (Signed)
Limit your sodium (Salt) intake  Please check your blood pressure on a regular basis.  If it is consistently greater than 150/90, please make an office appointment.  Return in 6 months for follow-up   

## 2015-04-02 NOTE — Progress Notes (Signed)
Pre visit review using our clinic review tool, if applicable. No additional management support is needed unless otherwise documented below in the visit note. 

## 2015-04-02 NOTE — Progress Notes (Signed)
Subjective:    Patient ID: Janice Small, female    DOB: 01/07/1928, 81 y.o.   MRN: [id number]  HPI  81 year old patient who has essential hypertension and hypothyroidism.  She is seen today for her six-month follow-up and has done remarkably well without concerns or complaints.  Past Medical History  Diagnosis Date  . BREAST BIOPSY, HX OF 11/27/2006  . COLONIC POLYPS, HX OF 11/27/2006  . HEMORRHOIDS 01/17/2010  . HYPERTENSION 11/27/2006  . HYPOTHYROIDISM 11/27/2006  . MENOPAUSAL SYNDROME 11/27/2006    Social History   Social History  . Marital Status: Married    Spouse Name: N/A  . Number of Children: N/A  . Years of Education: N/A   Occupational History  . Not on file.   Social History Main Topics  . Smoking status: Never Smoker   . Smokeless tobacco: Never Used  . Alcohol Use: Yes     Comment: rarely  . Drug Use: No  . Sexual Activity: Not on file   Other Topics Concern  . Not on file   Social History Narrative    Past Surgical History  Procedure Laterality Date  . Appendectomy    . Abdominal hysterectomy    . Tonsillectomy    . Breast surgery      bx    Family History  Problem Relation Age of Onset  . Heart disease Father     Allergies  Allergen Reactions  . Codeine Phosphate     REACTION: unspecified    Current Outpatient Prescriptions on File Prior to Visit  Medication Sig Dispense Refill  . Calcium Carbonate (CALCIUM 500 PO) Take 1 tablet by mouth daily.      Marland Kitchen levothyroxine (SYNTHROID, LEVOTHROID) 75 MCG tablet Take 1 tablet (75 mcg total) by mouth daily. 90 tablet 1  . lisinopril (PRINIVIL,ZESTRIL) 10 MG tablet TAKE 1 TABLET DAILY 90 tablet 1  . metroNIDAZOLE (METROGEL) 0.75 % gel Apply 1 application topically at bedtime.     . Multiple Vitamin (MULTIVITAMIN) tablet Take 1 tablet by mouth daily.       No current facility-administered medications on file prior to visit.    BP 130/68 mmHg  Pulse 81  Temp(Src) 98 F (36.7 C) (Oral)  Resp  18  Ht 5' 0.25" (1.53 m)  Wt 146 lb (66.225 kg)  BMI 28.29 kg/m2  SpO2 97%     Review of Systems  Constitutional: Negative.   HENT: Negative for congestion, dental problem, hearing loss, rhinorrhea, sinus pressure, sore throat and tinnitus.   Eyes: Negative for pain, discharge and visual disturbance.  Respiratory: Negative for cough and shortness of breath.   Cardiovascular: Negative for chest pain, palpitations and leg swelling.  Gastrointestinal: Negative for nausea, vomiting, abdominal pain, diarrhea, constipation, blood in stool and abdominal distention.  Genitourinary: Negative for dysuria, urgency, frequency, hematuria, flank pain, vaginal bleeding, vaginal discharge, difficulty urinating, vaginal pain and pelvic pain.  Musculoskeletal: Negative for joint swelling, arthralgias and gait problem.  Skin: Negative for rash.  Neurological: Negative for dizziness, syncope, speech difficulty, weakness, numbness and headaches.  Hematological: Negative for adenopathy.  Psychiatric/Behavioral: Negative for behavioral problems, dysphoric mood and agitation. The patient is not nervous/anxious.        Objective:   Physical Exam  Constitutional: She is oriented to person, place, and time. She appears well-developed and well-nourished.  HENT:  Head: Normocephalic.  Right Ear: External ear normal.  Left Ear: External ear normal.  Mouth/Throat: Oropharynx is clear and moist.  Eyes: Conjunctivae  and EOM are normal. Pupils are equal, round, and reactive to light.  Neck: Normal range of motion. Neck supple. No thyromegaly present.  Cardiovascular: Normal rate, regular rhythm, normal heart sounds and intact distal pulses.   Pulmonary/Chest: Effort normal and breath sounds normal.  Abdominal: Soft. Bowel sounds are normal. She exhibits no mass. There is no tenderness.  Musculoskeletal: Normal range of motion.  Lymphadenopathy:    She has no cervical adenopathy.  Neurological: She is alert  and oriented to person, place, and time.  Skin: Skin is warm and dry. No rash noted.  Psychiatric: She has a normal mood and affect. Her behavior is normal.          Assessment & Plan:   Hypertension, well-controlled.  We'll continue restricted salt diet and present antihypertensive regimen Hypothyroidism.  We'll continue levothyroxin  CPX 6 months with lab Medications refilled

## 2015-04-03 ENCOUNTER — Other Ambulatory Visit: Payer: Self-pay | Admitting: *Deleted

## 2015-04-03 MED ORDER — AMLODIPINE BESYLATE 10 MG PO TABS: 10.0000 mg | ORAL_TABLET | Freq: Every day | ORAL | Status: DC

## 2015-09-25 ENCOUNTER — Other Ambulatory Visit: Payer: Self-pay

## 2015-09-25 DIAGNOSIS — Z1231 Encounter for screening mammogram for malignant neoplasm of breast: Secondary | ICD-10-CM

## 2015-10-01 ENCOUNTER — Ambulatory Visit (INDEPENDENT_AMBULATORY_CARE_PROVIDER_SITE_OTHER): Payer: Medicare Other | Admitting: Internal Medicine

## 2015-10-01 ENCOUNTER — Encounter: Payer: Self-pay | Admitting: Internal Medicine

## 2015-10-01 VITALS — BP 140/76 | HR 72 | Temp 98.0°F | Resp 18 | Ht 60.5 in | Wt 148.0 lb

## 2015-10-01 DIAGNOSIS — I10 Essential (primary) hypertension: Secondary | ICD-10-CM | POA: Diagnosis not present

## 2015-10-01 DIAGNOSIS — E039 Hypothyroidism, unspecified: Secondary | ICD-10-CM | POA: Diagnosis not present

## 2015-10-01 DIAGNOSIS — Z0000 Encounter for general adult medical examination without abnormal findings: Secondary | ICD-10-CM | POA: Diagnosis not present

## 2015-10-01 DIAGNOSIS — Z86010 Personal history of colonic polyps: Secondary | ICD-10-CM

## 2015-10-01 LAB — CBC WITH DIFFERENTIAL/PLATELET
Basophils Absolute: 0 10*3/uL (ref 0.0–0.1)
Basophils Relative: 0.5 % (ref 0.0–3.0)
EOS ABS: 0.1 10*3/uL (ref 0.0–0.7)
Eosinophils Relative: 1.8 % (ref 0.0–5.0)
HCT: 40.2 % (ref 36.0–46.0)
HEMOGLOBIN: 13.4 g/dL (ref 12.0–15.0)
LYMPHS PCT: 24.9 % (ref 12.0–46.0)
Lymphs Abs: 1.2 10*3/uL (ref 0.7–4.0)
MCHC: 33.4 g/dL (ref 30.0–36.0)
MCV: 92.4 fl (ref 78.0–100.0)
Monocytes Absolute: 0.4 10*3/uL (ref 0.1–1.0)
Monocytes Relative: 8.7 % (ref 3.0–12.0)
Neutro Abs: 3.1 10*3/uL (ref 1.4–7.7)
Neutrophils Relative %: 64.1 % (ref 43.0–77.0)
Platelets: 149 10*3/uL — ABNORMAL LOW (ref 150.0–400.0)
RBC: 4.36 Mil/uL (ref 3.87–5.11)
RDW: 13.9 % (ref 11.5–15.5)
WBC: 4.8 10*3/uL (ref 4.0–10.5)

## 2015-10-01 LAB — COMPREHENSIVE METABOLIC PANEL
ALBUMIN: 4.3 g/dL (ref 3.5–5.2)
ALK PHOS: 65 U/L (ref 39–117)
ALT: 16 U/L (ref 0–35)
AST: 10 U/L (ref 0–37)
BUN: 28 mg/dL — ABNORMAL HIGH (ref 6–23)
CALCIUM: 11 mg/dL — AB (ref 8.4–10.5)
CO2: 28 mEq/L (ref 19–32)
CREATININE: 0.88 mg/dL (ref 0.40–1.20)
Chloride: 104 mEq/L (ref 96–112)
GFR: 64.78 mL/min (ref >60.00)
Glucose, Bld: 99 mg/dL (ref 70–99)
POTASSIUM: 3.7 meq/L (ref 3.5–5.1)
SODIUM: 141 meq/L (ref 135–145)
TOTAL PROTEIN: 6.6 g/dL (ref 6.0–8.3)
Total Bilirubin: 0.7 mg/dL (ref 0.2–1.2)

## 2015-10-01 LAB — TSH: TSH: 1.93 u[IU]/mL (ref 0.35–4.50)

## 2015-10-01 MED ORDER — LEVOTHYROXINE SODIUM 75 MCG PO TABS: 75.0000 ug | ORAL_TABLET | Freq: Every day | ORAL | Status: DC

## 2015-10-01 MED ORDER — LISINOPRIL 10 MG PO TABS: 10.0000 mg | ORAL_TABLET | Freq: Every day | ORAL | Status: DC

## 2015-10-01 NOTE — Progress Notes (Signed)
Subjective:    Patient ID: Janice Small, female    DOB: 08/04/1927, 82 y.o.   MRN: [id number]  HPI Pre-visit discussion using our clinic review tool. No additional management support is needed unless otherwise documented below in the visit note.    History of Present Illness:   82  year-old patient who is seen today for a wellness exam.  Medical problems include hypertension, hypothyroidism, and a history of colonic polyps. Last colonoscopy was 2004. She is doing quite well. She has a history of mild stable hypercalcemia while on thiazide diuretics. Medical regimen includes calcium and vitamin D supplementation.  Diuretic therapy has been discontinued.  She is scheduled for follow-up mammogram next month.  She has been followed  by [facility]  Here for Medicare AWV:   1. Risk factors based on Past M, S, F history: Cardio vascular risk factors include hypertension, and a family history of coronary artery disease  2. Physical Activities: remains quite active without limiting factors. Does have some mild chronic low back pain; [employer] program 3-4 times per week  3. Depression/mood: no history of depression or mood disorder  4. Hearing: no deficits  5. ADL's: independent in all aspects of daily living  6. Fall Risk: low  7. Home Safety: no problems identified  8. Height, weight, &visual acuity:height and weight stable. No change in visual acuity .  Sees Dr. Bing Plume annually in the summer 9. Counseling: heart healthy diet regular. Exercise, all encouraged  10. Labs ordered based on risk factors: laboratory profile, including lipid profile, and TSH will be reviewed  11. Referral Coordination- follow-up ophthalmology; mammogram as scheduled 12. Care Plan- follow-up lab   And mammogram as scheduled 13. Cognitive Assessment- alert and oriented, with normal affect. No cognitive dysfunction, or memory impairment. Handles all executive functioning of household  14.  Preventive  services will include annual clinical exam with screening lab.  Annual eye examinations recommended.  Patient was provided with a written and personalized care plan 15.   Provider list update includes ophthalmology and primary care as well as orthopedics.   Allergies:  1) Codeine Phosphate (Codeine Phosphate)   Past History:   Colonic polyps, hx of  Hypertension  Hypothyroidism  external hemorrhoids   Past Surgical History:   Appendectomy 1981  Hysterectomy 1981  Tonsillectomy  gravida 3, para 3, abortus zero  history breast biopsy  colonoscopy September 2004   Family History:   both her parents died at 47, father from coronary artery disease,  mother from uterine cancer.  No siblings   Social History:   Married  Never Smoked  husband status post CABG in 2010-history of dementia  3 sons, 5 grandchildren    Review of Systems  Constitutional: Negative for fever, appetite change, fatigue and unexpected weight change.  HENT: Negative for congestion, dental problem, ear pain, hearing loss, mouth sores, nosebleeds, sinus pressure, sore throat, tinnitus, trouble swallowing and voice change.   Eyes: Negative for photophobia, pain, redness and visual disturbance.  Respiratory: Negative for cough, chest tightness and shortness of breath.   Cardiovascular: Negative for chest pain, palpitations and leg swelling.  Gastrointestinal: Negative for nausea, vomiting, abdominal pain, diarrhea, constipation, blood in stool, abdominal distention and rectal pain.  Genitourinary: Negative for dysuria, urgency, frequency, hematuria, flank pain, vaginal bleeding, vaginal discharge, difficulty urinating, genital sores, vaginal pain, menstrual problem and pelvic pain.  Musculoskeletal: Negative for back pain, arthralgias and neck stiffness.  Skin: Negative for rash.  Neurological: Negative for dizziness,  syncope, speech difficulty, weakness, light-headedness, numbness and headaches.   Hematological: Negative for adenopathy. Does not bruise/bleed easily.  Psychiatric/Behavioral: Negative for suicidal ideas, behavioral problems, self-injury, dysphoric mood and agitation. The patient is not nervous/anxious.        Objective:   Physical Exam  Constitutional: She is oriented to person, place, and time. She appears well-developed and well-nourished.  HENT:  Head: Normocephalic and atraumatic.  Right Ear: External ear normal.  Left Ear: External ear normal.  Mouth/Throat: Oropharynx is clear and moist.  Eyes: Conjunctivae and EOM are normal.  Neck: Normal range of motion. Neck supple. No JVD present. No thyromegaly present.  Cardiovascular: Normal rate, regular rhythm, normal heart sounds and intact distal pulses.   No murmur heard. Pulmonary/Chest: Effort normal. She has no wheezes. She has no rales.  Abdominal: Soft. Bowel sounds are normal. She exhibits no distension and no mass. There is no tenderness. There is no rebound and no guarding.  Musculoskeletal: Normal range of motion. She exhibits no edema or tenderness.  Neurological: She is alert and oriented to person, place, and time. She has normal reflexes. No cranial nerve deficit. She exhibits normal muscle tone. Coordination normal.  Skin: Skin is warm and dry. No rash noted.  Psychiatric: She has a normal mood and affect. Her behavior is normal.          Assessment & Plan:   Preventive health History of colonic polyps.  History of Mild hypercalcemia..  This is mild and has improved since discontinuation of thiazide diuretic.  Will check a PTH with screening lab Hypertension.  Well-controlled History of colonic polyps  Recheck 6 months Low-salt diet recommended Regular exercise regimen encouraged Heart healthy diet.  Also encouraged No change in medical regimen

## 2015-10-01 NOTE — Progress Notes (Signed)
Pre visit review using our clinic review tool, if applicable. No additional management support is needed unless otherwise documented below in the visit note. 

## 2015-10-01 NOTE — Patient Instructions (Signed)
Limit your sodium (Salt) intake  Please check your blood pressure on a regular basis.  If it is consistently greater than 150/90, please make an office appointment.    It is important that you exercise regularly, at least 20 minutes 3 to 4 times per week.  If you develop chest pain or shortness of breath seek  medical attention.  Take a calcium supplement, plus 800-1200 units of vitamin D  Return in 6 months for follow-up   

## 2015-10-10 ENCOUNTER — Telehealth: Payer: Self-pay | Admitting: Internal Medicine

## 2015-10-10 NOTE — Telephone Encounter (Signed)
Called [facility] Medicare Complete at the request of [place] in order to request advanced determination for Cologuard.  Spoke with Thad Ranger. At [facility] who submitted advanced determination.  Per rep this process can take up to 14 day to complete and if any additional clinical information is needed they will call our office to speak with Dr. Marthann Schiller nurse.  Once processed a letter will be sent to both the patient and pcp with outcome of determination.

## 2015-10-11 NOTE — Telephone Encounter (Signed)
Spoke to pt, told her we received a fax from [facility] saying we had to do Prior Authorization with insurance before they will send the kit. PA was started and it can take up to 14 days to be complete. They will send a letter to you and [place] if PA approved and then the kit will be mailed to you. Just wanted to let you know what was going on. Pt verbalized understanding.

## 2015-10-17 ENCOUNTER — Ambulatory Visit
Admission: RE | Admit: 2015-10-17 | Discharge: 2015-10-17 | Disposition: A | Discharge status: Home / Self Care | Payer: Medicare Other | Source: Ambulatory Visit

## 2015-10-17 DIAGNOSIS — Z1231 Encounter for screening mammogram for malignant neoplasm of breast: Secondary | ICD-10-CM

## 2015-10-18 ENCOUNTER — Telehealth: Payer: Self-pay | Admitting: Internal Medicine

## 2015-10-18 NOTE — Telephone Encounter (Signed)
Spoke to pt, she said she had decided not to do the Cologuard. She said insurance company called her earlier this week and said that she is over the age for it to be done but they are still working on Prior authorization and pt said it is too much trouble. Pt said she wants to come by and pickup hemoccult cards and do that. Told her okay I will put it at the front desk for her to pickup. Pt verbalized understanding.

## 2015-10-18 NOTE — Telephone Encounter (Signed)
Pt would like donna to return her call concerning cologuard

## 2015-10-31 ENCOUNTER — Telehealth: Payer: Self-pay | Admitting: *Deleted

## 2015-10-31 NOTE — Telephone Encounter (Signed)
Tried to contact pt no answer, will try again later. Received approval from Insurance for Cologuard to be done.

## 2015-11-15 NOTE — Telephone Encounter (Signed)
Spoke to pt, asked if she received letter saying that Cologuard was approved to be done from insurance. Pt stated she received a phone call and she told them she was not going to have it done. Told her okay.

## 2016-03-08 ENCOUNTER — Encounter: Payer: Self-pay | Admitting: Family Medicine

## 2016-03-08 ENCOUNTER — Other Ambulatory Visit (HOSPITAL_COMMUNITY)
Admission: RE | Admit: 2016-03-08 | Discharge: 2016-03-08 | Disposition: A | Discharge status: Home / Self Care | Payer: Medicare Other | Source: Other Acute Inpatient Hospital | Attending: Family Medicine | Admitting: Family Medicine

## 2016-03-08 ENCOUNTER — Ambulatory Visit (INDEPENDENT_AMBULATORY_CARE_PROVIDER_SITE_OTHER): Payer: Medicare Other | Admitting: Family Medicine

## 2016-03-08 VITALS — BP 140/70 | HR 67 | Temp 98.1°F | Resp 18 | Ht 60.5 in | Wt 147.5 lb

## 2016-03-08 DIAGNOSIS — K625 Hemorrhage of anus and rectum: Secondary | ICD-10-CM

## 2016-03-08 LAB — CBC
HEMATOCRIT: 40.6 % (ref 36.0–46.0)
Hemoglobin: 13.8 g/dL (ref 12.0–15.0)
MCH: 32 pg (ref 26.0–34.0)
MCHC: 34 g/dL (ref 30.0–36.0)
MCV: 94.2 fL (ref 78.0–100.0)
Platelets: 186 10*3/uL (ref 150–400)
RBC: 4.31 MIL/uL (ref 3.87–5.11)
RDW: 13.2 % (ref 11.5–15.5)
WBC: 11.6 10*3/uL — ABNORMAL HIGH (ref 4.0–10.5)

## 2016-03-08 NOTE — Progress Notes (Signed)
Subjective:  Janice Small is a 82 y.o. year old very pleasant female patient who presents for/with See problem oriented charting ROS- see any ROS included in HPI as well.   Past Medical History-  Patient Active Problem List   Diagnosis Date Noted  . Hypercalcemia 06/16/2011  . Hemorrhoids 01/17/2010  . Hypothyroidism 11/27/2006  . Essential hypertension 11/27/2006  . MENOPAUSAL SYNDROME 11/27/2006  . History of colonic polyps 11/27/2006  . BREAST BIOPSY, HX OF 11/27/2006    Medications- reviewed and updated Current Outpatient Prescriptions  Medication Sig Dispense Refill  . amLODipine (NORVASC) 10 MG tablet Take 1 tablet (10 mg total) by mouth daily. 90 tablet 3  . Calcium Carbonate (CALCIUM 500 PO) Take 1 tablet by mouth daily.      Marland Kitchen levothyroxine (SYNTHROID, LEVOTHROID) 75 MCG tablet Take 1 tablet (75 mcg total) by mouth daily. 90 tablet 1  . lisinopril (PRINIVIL,ZESTRIL) 10 MG tablet Take 1 tablet (10 mg total) by mouth daily. 90 tablet 1  . metroNIDAZOLE (METROGEL) 0.75 % gel Apply 1 application topically at bedtime.     . Multiple Vitamin (MULTIVITAMIN) tablet Take 1 tablet by mouth daily.       No current facility-administered medications for this visit.     Objective: BP 140/70   Pulse 67   Temp 98.1 F (36.7 C) (Oral)   Resp 18   Ht 5' 0.5" (1.537 m)   Wt 147 lb 8 oz (66.9 kg)   SpO2 96%   BMI 28.33 kg/m  Gen: NAD, resting comfortably, well appearing Slightly dry mucus membranes CV: RRR no murmurs rubs or gallops Lungs: CTAB no crackles, wheeze, rhonchi Abdomen: soft/nontender/nondistended/normal bowel sounds. No rebound or guarding.  Ext: no edema Skin: warm, dry, no rash Neuro: grossly normal, moves all extremities  Assessment/Plan:  Hemorrhoid concern Rectal bleeding S: started with diarrhea after supper. Hemorrhoids seemed to get flared up. Still somewhat nauseous- taking kayopectate. Does not take aspirin or any blood thinners. History of issues  with hemorrhoids- they usually only bleed with diarrhea or constipation- this is more than normal for her. All of her hemorrhoids are internal per history. 2 episodes of diarrhea. everytime she pees- she wipes and notes blood as well but only small volume ROS- no chest pain or shortness of breath, mild fatigue noted.  A/P: on exam there are no obvious hemorrhoids. There was a small amount of gross blood on my finger after rectal exam. She declined anoscope exam- discussed could give more info if obvious bleeding hemorrhoid- on otherhand appears to be slowing down. We will send to hospital for CBC and if no significant drop in hgb- we can continue to monitor through the weekend if continues to slow- otherwise may need hospitalization (hgb drops, or worsening rectal bleeding). She tends to just wait these episodes out in past and within 1-2 days of diarrhea or constipation stopping- bleeding stops. She also complained of very mild headache- advised increased hydration with GI losses. Also complains of feeling gassy- can trial gas-x. She declined trial of zofran  Orders Placed This Encounter  Procedures  . CBC    [place]    Standing Status:   Future    Standing Expiration Date:   03/08/2017   Return precautions advised.  Garret Reddish, MD

## 2016-03-08 NOTE — Progress Notes (Signed)
Pre-visit discussion using our clinic review tool. No additional management support is needed unless otherwise documented below in the visit note.  

## 2016-03-08 NOTE — Patient Instructions (Signed)
This bleeding may be from your hemorrhoids or it could be from another source. The amount on rectal exam was minimal but there was still blood. For this reason- want to get a blood test- go to hospital to have this done. I should have results back today- if there is a significant drop in blood levels- will advise you go to go the hospital this evening. Also if you have continued or worsening bleeding- proceeding to the hospital would be advised (especially if you have chest pain, shortness of breath or worsening fatigue)

## 2016-03-10 ENCOUNTER — Telehealth: Payer: Self-pay | Admitting: Emergency Medicine

## 2016-03-10 NOTE — Telephone Encounter (Signed)
Pt scheduled to see Dr.K. Tomorrow 8/29.

## 2016-03-10 NOTE — Telephone Encounter (Signed)
Please Advise  Pt seen by Dr. Yong Channel 03/08/16      PLEASE NOTE: All timestamps contained within this report are represented as [place] Standard Time. CONFIDENTIALTY NOTICE: This fax transmission is intended only for the addressee. It contains information that is legally privileged, confidential or otherwise protected from use or disclosure. If you are not the intended recipient, you are strictly prohibited from reviewing, disclosing, copying using or disseminating any of this information or taking any action in reliance on or regarding this information. If you have received this fax in error, please notify us immediately by telephone so that we can arrange for its return to [place]. Phone: [phone], Toll-Free: [phone], Fax: [phone] Page: 1 of 2 Call Id: [id number] [facility] - Client [place] Patient Name: Janice Small Gender: Female DOB: 10-16-1927 Age: 82 Y 3 M 20 D Return Phone Number: [phone] (Primary) Address: City/State/Zip:  Client [facility] - Client Client Site [place] - Night Physician Simonne Martinet - MD Contact Type Call Who Is Calling Patient / Member / Family / Caregiver Call Type Triage / Clinical Relationship To Patient Self Return Phone Number [phone] (Primary) Chief Complaint Rectal Bleeding Reason for Call Symptomatic / Request for Health Information Initial Comment caller states she has rectal bleeding PreDisposition Go to Urgent Care/Walk-In Clinic Translation No Nurse Assessment Nurse: Toribio Harbour, RN, Joelene Millin Date/Time (Eastern Time): 03/08/2016 9:00:23 AM Confirm and document reason for call. If symptomatic, describe symptoms. You must click the next button to save text entered. ---caller states is having rectal bleeding, has hx of hemorrhoids, has had cramping and diarrhea, started last night at [place] the  patient traveled out of the country within the last 30 days? ---No Does the patient have any new or worsening symptoms? ---Yes Will a triage be completed? ---Yes Related visit to physician within the last 2 weeks? ---No Does the PT have any chronic conditions? (i.e. diabetes, asthma, etc.) ---No Is this a behavioral health or substance abuse call? ---No Guidelines Guideline Title Affirmed Question Affirmed Notes Nurse Date/Time Eilene Ghazi Time) Rectal Bleeding MILD rectal bleeding (more than just a few drops or streaks) Daves, RN, Joelene Millin 03/08/2016 9:03:07 AM Disp. Time Eilene Ghazi Time) Disposition Final User 03/08/2016 9:12:04 AM See PCP When Office is Open (within 3 days) Yes Daves, RN, Renea Ee Understands: Yes PLEASE NOTE: All timestamps contained within this report are represented as [place] Standard Time. CONFIDENTIALTY NOTICE: This fax transmission is intended only for the addressee. It contains information that is legally privileged, confidential or otherwise protected from use or disclosure. If you are not the intended recipient, you are strictly prohibited from reviewing, disclosing, copying using or disseminating any of this information or taking any action in reliance on or regarding this information. If you have received this fax in error, please notify us immediately by telephone so that we can arrange for its return to [place]. Phone: [phone], Toll-Free: [phone], Fax: [phone] Page: 2 of 2 Call Id: [id number] Disagree/Comply: Comply Care Advice Given Per Guideline * You need to be seen within 2 or 3 days. Call your doctor during regular office hours and make an appointment. An urgent care center is often the best source of care if your doctor's office is closed or you can't get an appointment. NOTE: If office will be open tomorrow, tell caller to call then, not in 3 days. CALL BACK IF: * Bleeding increases * Dizziness occurs * You become  worse. CARE ADVICE  given per Rectal Bleeding (Adult) guideline.

## 2016-03-11 ENCOUNTER — Ambulatory Visit (INDEPENDENT_AMBULATORY_CARE_PROVIDER_SITE_OTHER): Payer: Medicare Other | Admitting: Internal Medicine

## 2016-03-11 ENCOUNTER — Encounter: Payer: Self-pay | Admitting: Internal Medicine

## 2016-03-11 VITALS — BP 136/72 | HR 80 | Temp 98.3°F | Ht 60.5 in | Wt 147.0 lb

## 2016-03-11 DIAGNOSIS — K649 Unspecified hemorrhoids: Secondary | ICD-10-CM | POA: Diagnosis not present

## 2016-03-11 DIAGNOSIS — K625 Hemorrhage of anus and rectum: Secondary | ICD-10-CM

## 2016-03-11 DIAGNOSIS — Z86010 Personal history of colonic polyps: Secondary | ICD-10-CM

## 2016-03-11 DIAGNOSIS — I10 Essential (primary) hypertension: Secondary | ICD-10-CM | POA: Diagnosis not present

## 2016-03-11 NOTE — Patient Instructions (Addendum)
Return in one month for follow-up  Report any recurrent rectal bleeding or abdominal pain  GI consultation

## 2016-03-11 NOTE — Progress Notes (Signed)
Subjective:    Patient ID: Janice Small, female    DOB: [birth year], 82 y.o.   MRN: [id number]  HPI 82 year old patient who is seen today in follow-up for rectal bleeding. She has remote history of colonic polyps and last colonoscopy was 2004.  She also has a history of internal hemorrhoids. She was stable until 4 days ago when she had an episode of crampy lower abdominal pain.  This was associated with some diarrhea and the onset of bright red rectal bleeding.  Some blood was noted on the tissue paper. The following day she was evaluated [facility] and a CBC was obtained that was unremarkable. She feels that she has improved but is still quite concerned about the rectal bleeding.  She now describes her stool as being "black".  She has had no recurrent crampy abdominal pain  Past Medical History:  Diagnosis Date  . BREAST BIOPSY, HX OF 11/27/2006  . COLONIC POLYPS, HX OF 11/27/2006  . HEMORRHOIDS 01/17/2010  . HYPERTENSION 11/27/2006  . HYPOTHYROIDISM 11/27/2006  . MENOPAUSAL SYNDROME 11/27/2006     Social History   Social History  . Marital status: Married    Spouse name: N/A  . Number of children: N/A  . Years of education: N/A   Occupational History  . Not on file.   Social History Main Topics  . Smoking status: Never Smoker  . Smokeless tobacco: Never Used  . Alcohol use Yes     Comment: rarely  . Drug use: No  . Sexual activity: Not on file   Other Topics Concern  . Not on file   Social History Narrative  . No narrative on file    Past Surgical History:  Procedure Laterality Date  . ABDOMINAL HYSTERECTOMY    . APPENDECTOMY    . BREAST SURGERY     bx  . TONSILLECTOMY      Family History  Problem Relation Age of Onset  . Heart disease Father     Allergies  Allergen Reactions  . Codeine Phosphate     REACTION: unspecified    Current Outpatient Prescriptions on File Prior to Visit  Medication Sig Dispense Refill  . amLODipine (NORVASC) 10  MG tablet Take 1 tablet (10 mg total) by mouth daily. 90 tablet 3  . Calcium Carbonate (CALCIUM 500 PO) Take 1 tablet by mouth daily.      Marland Kitchen levothyroxine (SYNTHROID, LEVOTHROID) 75 MCG tablet Take 1 tablet (75 mcg total) by mouth daily. 90 tablet 1  . lisinopril (PRINIVIL,ZESTRIL) 10 MG tablet Take 1 tablet (10 mg total) by mouth daily. 90 tablet 1  . metroNIDAZOLE (METROGEL) 0.75 % gel Apply 1 application topically at bedtime.     . Multiple Vitamin (MULTIVITAMIN) tablet Take 1 tablet by mouth daily.       No current facility-administered medications on file prior to visit.     BP 136/72 (BP Location: Left Arm, Patient Position: Sitting, Cuff Size: Normal)   Pulse 80   Temp 98.3 F (36.8 C) (Oral)   Ht 5' 0.5" (1.537 m)   Wt 147 lb (66.7 kg)   BMI 28.24 kg/m      Review of Systems  Constitutional: Negative.   HENT: Negative for congestion, dental problem, hearing loss, rhinorrhea, sinus pressure, sore throat and tinnitus.   Eyes: Negative for pain, discharge and visual disturbance.  Respiratory: Negative for cough and shortness of breath.   Cardiovascular: Negative for chest pain, palpitations and leg swelling.  Gastrointestinal:  Positive for abdominal pain, blood in stool and diarrhea. Negative for abdominal distention, constipation, nausea, rectal pain and vomiting.  Genitourinary: Negative for difficulty urinating, dysuria, flank pain, frequency, hematuria, pelvic pain, urgency, vaginal bleeding, vaginal discharge and vaginal pain.  Musculoskeletal: Negative for arthralgias, gait problem and joint swelling.  Skin: Negative for rash.  Neurological: Negative for dizziness, syncope, speech difficulty, weakness, numbness and headaches.  Hematological: Negative for adenopathy.  Psychiatric/Behavioral: Negative for agitation, behavioral problems and dysphoric mood. The patient is not nervous/anxious.        Objective:   Physical Exam  Constitutional: She appears well-developed  and well-nourished. No distress.  Cardiovascular: Normal rate, regular rhythm and normal heart sounds.   Pulmonary/Chest: Effort normal and breath sounds normal.  Abdominal: Soft. Bowel sounds are normal. She exhibits no distension and no mass. There is no tenderness. There is no rebound and no guarding.  Genitourinary:  Genitourinary Comments: Rectal exam reveals soft, dark brown and hematest positive stool Some external hemorrhoidal tags noted Internal exam was unremarkable without mass or obvious hemorrhoid          Assessment & Plan:   Lower GI bleeding.  Possibly secondary to hemorrhoidal disease in the setting of diarrhea.  Crampy abdominal pain that precipitated the episode, suggest possibility of ischemic colitis.  Patient is quite concerned about the etiology of her bleeding.  Options were discussed including further observation since she has improved. She prefers GI evaluation.  Will refer  Nyoka Cowden

## 2016-03-12 ENCOUNTER — Telehealth: Payer: Self-pay | Admitting: Internal Medicine

## 2016-03-12 ENCOUNTER — Encounter: Payer: Self-pay | Admitting: Internal Medicine

## 2016-03-12 NOTE — Telephone Encounter (Signed)
Patient reports gas and indigestion.  She is advised to try Prilosec OTC and align until her office visit. Marland Kitchen She will call back for additional questions or concerns.

## 2016-04-02 ENCOUNTER — Ambulatory Visit: Payer: Medicare Other | Admitting: Internal Medicine

## 2016-04-11 ENCOUNTER — Encounter: Payer: Self-pay | Admitting: Internal Medicine

## 2016-04-11 ENCOUNTER — Ambulatory Visit (INDEPENDENT_AMBULATORY_CARE_PROVIDER_SITE_OTHER): Payer: Medicare Other | Admitting: Internal Medicine

## 2016-04-11 VITALS — BP 150/74 | HR 92 | Temp 98.2°F | Resp 18 | Ht 60.5 in | Wt 148.4 lb

## 2016-04-11 DIAGNOSIS — K625 Hemorrhage of anus and rectum: Secondary | ICD-10-CM

## 2016-04-11 DIAGNOSIS — Z23 Encounter for immunization: Secondary | ICD-10-CM | POA: Diagnosis not present

## 2016-04-11 DIAGNOSIS — E038 Other specified hypothyroidism: Secondary | ICD-10-CM

## 2016-04-11 DIAGNOSIS — I10 Essential (primary) hypertension: Secondary | ICD-10-CM | POA: Diagnosis not present

## 2016-04-11 LAB — CBC WITH DIFFERENTIAL/PLATELET
BASOS PCT: 0.5 % (ref 0.0–3.0)
Basophils Absolute: 0 10*3/uL (ref 0.0–0.1)
Eosinophils Absolute: 0.2 10*3/uL (ref 0.0–0.7)
Eosinophils Relative: 4.1 % (ref 0.0–5.0)
HCT: 38.7 % (ref 36.0–46.0)
Hemoglobin: 13.1 g/dL (ref 12.0–15.0)
Lymphocytes Relative: 25.6 % (ref 12.0–46.0)
Lymphs Abs: 1.3 10*3/uL (ref 0.7–4.0)
MCHC: 33.9 g/dL (ref 30.0–36.0)
MCV: 93.7 fl (ref 78.0–100.0)
MONO ABS: 0.4 10*3/uL (ref 0.1–1.0)
Monocytes Relative: 7.3 % (ref 3.0–12.0)
Neutro Abs: 3.1 10*3/uL (ref 1.4–7.7)
Neutrophils Relative %: 62.5 % (ref 43.0–77.0)
Platelets: 147 10*3/uL — ABNORMAL LOW (ref 150.0–400.0)
RBC: 4.12 Mil/uL (ref 3.87–5.11)
RDW: 13.6 % (ref 11.5–15.5)
WBC: 4.9 10*3/uL (ref 4.0–10.5)

## 2016-04-11 NOTE — Progress Notes (Signed)
Subjective:    Patient ID: Janice Small, female    DOB: September 11, 1927, 82 y.o.   MRN: [id number]  HPI 82 year old patient who is seen today in follow-up after an episode of bright red rectal bleeding.  She continues to have some occasional gaseousness but no further lower abdominal crampy pain.  She's had no further rectal bleeding.  She has a history of internal hemorrhoids. She is scheduled to see GI but this appointment is not until November 13.  Past Medical History:  Diagnosis Date  . BREAST BIOPSY, HX OF 11/27/2006  . COLONIC POLYPS, HX OF 11/27/2006  . HEMORRHOIDS 01/17/2010  . HYPERTENSION 11/27/2006  . HYPOTHYROIDISM 11/27/2006  . MENOPAUSAL SYNDROME 11/27/2006     Social History   Social History  . Marital status: Married    Spouse name: N/A  . Number of children: N/A  . Years of education: N/A   Occupational History  . Not on file.   Social History Main Topics  . Smoking status: Never Smoker  . Smokeless tobacco: Never Used  . Alcohol use Yes     Comment: rarely  . Drug use: No  . Sexual activity: Not on file   Other Topics Concern  . Not on file   Social History Narrative  . No narrative on file    Past Surgical History:  Procedure Laterality Date  . ABDOMINAL HYSTERECTOMY    . APPENDECTOMY    . BREAST SURGERY     bx  . TONSILLECTOMY      Family History  Problem Relation Age of Onset  . Heart disease Father     Allergies  Allergen Reactions  . Codeine Phosphate     REACTION: unspecified    Current Outpatient Prescriptions on File Prior to Visit  Medication Sig Dispense Refill  . amLODipine (NORVASC) 10 MG tablet Take 1 tablet (10 mg total) by mouth daily. 90 tablet 3  . Calcium Carbonate (CALCIUM 500 PO) Take 1 tablet by mouth daily.      Marland Kitchen levothyroxine (SYNTHROID, LEVOTHROID) 75 MCG tablet Take 1 tablet (75 mcg total) by mouth daily. 90 tablet 1  . lisinopril (PRINIVIL,ZESTRIL) 10 MG tablet Take 1 tablet (10 mg total) by mouth daily. 90  tablet 1  . metroNIDAZOLE (METROGEL) 0.75 % gel Apply 1 application topically at bedtime.     . Multiple Vitamin (MULTIVITAMIN) tablet Take 1 tablet by mouth daily.       No current facility-administered medications on file prior to visit.     BP (!) 150/74 (BP Location: Left Arm, Patient Position: Sitting, Cuff Size: Normal)   Pulse 92   Temp 98.2 F (36.8 C) (Oral)   Resp 18   Ht 5' 0.5" (1.537 m)   Wt 148 lb 6.1 oz (67.3 kg)   SpO2 96%   BMI 28.50 kg/m      Review of Systems  Constitutional: Negative.   HENT: Negative for congestion, dental problem, hearing loss, rhinorrhea, sinus pressure, sore throat and tinnitus.   Eyes: Negative for pain, discharge and visual disturbance.  Respiratory: Negative for cough and shortness of breath.   Cardiovascular: Negative for chest pain, palpitations and leg swelling.  Gastrointestinal: Positive for abdominal pain and anal bleeding. Negative for abdominal distention, blood in stool, constipation, diarrhea, nausea and vomiting.  Genitourinary: Negative for difficulty urinating, dysuria, flank pain, frequency, hematuria, pelvic pain, urgency, vaginal bleeding, vaginal discharge and vaginal pain.  Musculoskeletal: Negative for arthralgias, gait problem and joint swelling.  Skin: Negative  for rash.  Neurological: Negative for dizziness, syncope, speech difficulty, weakness, numbness and headaches.  Hematological: Negative for adenopathy.  Psychiatric/Behavioral: Negative for agitation, behavioral problems and dysphoric mood. The patient is not nervous/anxious.        Objective:   Physical Exam  Constitutional: She is oriented to person, place, and time. She appears well-developed and well-nourished.  HENT:  Head: Normocephalic.  Right Ear: External ear normal.  Left Ear: External ear normal.  Mouth/Throat: Oropharynx is clear and moist.  Eyes: Conjunctivae and EOM are normal. Pupils are equal, round, and reactive to light.  Neck:  Normal range of motion. Neck supple. No thyromegaly present.  Cardiovascular: Normal rate, regular rhythm, normal heart sounds and intact distal pulses.   Pulmonary/Chest: Effort normal and breath sounds normal.  Abdominal: Soft. Bowel sounds are normal. She exhibits no distension and no mass. There is no tenderness. There is no rebound and no guarding.  Musculoskeletal: Normal range of motion.  Lymphadenopathy:    She has no cervical adenopathy.  Neurological: She is alert and oriented to person, place, and time.  Skin: Skin is warm and dry. No rash noted.  Psychiatric: She has a normal mood and affect. Her behavior is normal.          Assessment & Plan:   History of bright red rectal bleeding.  This has resolved.  Will check a CBC.  Patient has an appointment in 6 weeks if she is stable over the next 4 weeks.  She was told that she can cancel the GI follow-up Will report any recurrent symptoms Essential hypertension, stable Hypothyroidism Preventive health.  Flu vaccine administered  Nyoka Cowden

## 2016-04-11 NOTE — Patient Instructions (Signed)
Report any recurrent abdominal pain or rectal bleeding  Return in 6 months for follow-up  Limit your sodium (Salt) intake  Please check your blood pressure on a regular basis.  If it is consistently greater than 150/90, please make an office appointment.

## 2016-04-11 NOTE — Progress Notes (Signed)
Pre visit review using our clinic review tool, if applicable. No additional management support is needed unless otherwise documented below in the visit note. 

## 2016-04-21 ENCOUNTER — Telehealth: Payer: Self-pay | Admitting: Internal Medicine

## 2016-04-21 MED ORDER — LEVOTHYROXINE SODIUM 75 MCG PO TABS: 75.0000 ug | ORAL_TABLET | Freq: Every day | ORAL | 1 refills | Status: DC

## 2016-04-21 MED ORDER — LISINOPRIL 10 MG PO TABS: 10.0000 mg | ORAL_TABLET | Freq: Every day | ORAL | 1 refills | Status: DC

## 2016-04-21 NOTE — Telephone Encounter (Signed)
Left message on voicemail Rx's sent to pharmacy as requested. 

## 2016-04-21 NOTE — Telephone Encounter (Signed)
Pt need new Rx for levothyroxine and lisinopril   Pharm:  [facility].

## 2016-05-26 ENCOUNTER — Ambulatory Visit: Payer: Medicare Other | Admitting: Internal Medicine

## 2016-06-23 ENCOUNTER — Telehealth: Payer: Self-pay | Admitting: Internal Medicine

## 2016-06-23 MED ORDER — AMLODIPINE BESYLATE 10 MG PO TABS: 10.0000 mg | ORAL_TABLET | Freq: Every day | ORAL | 3 refills | Status: DC

## 2016-06-23 NOTE — Telephone Encounter (Signed)
Spoke to pt, told her Rx was sent to [facility] as requested. Pt verbalized understanding.

## 2016-06-23 NOTE — Telephone Encounter (Signed)
° ° ° °  Pt request refill of the following:  amLODipine (NORVASC) 10 MG tablet    Phamacy:    [facility]

## 2016-09-17 ENCOUNTER — Encounter: Payer: Self-pay | Admitting: Family Medicine

## 2016-09-17 ENCOUNTER — Ambulatory Visit (INDEPENDENT_AMBULATORY_CARE_PROVIDER_SITE_OTHER): Payer: Medicare Other | Admitting: Family Medicine

## 2016-09-17 VITALS — BP 154/87 | HR 86 | Temp 98.3°F | Ht 60.5 in | Wt 150.0 lb

## 2016-09-17 DIAGNOSIS — J209 Acute bronchitis, unspecified: Secondary | ICD-10-CM

## 2016-09-17 MED ORDER — AMOXICILLIN-POT CLAVULANATE 875-125 MG PO TABS: 1.0000 | ORAL_TABLET | Freq: Two times a day (BID) | ORAL | 0 refills | Status: DC

## 2016-09-17 NOTE — Progress Notes (Signed)
   Subjective:    Patient ID: Janice Small, female    DOB: 06-Feb-1928, 83 y.o.   MRN: [id number]  HPI Here for one month of chest congestion and a dry cough. No fever. Using Mucinex.    Review of Systems  Constitutional: Negative.   HENT: Negative.   Eyes: Negative.   Respiratory: Positive for cough and chest tightness. Negative for shortness of breath and wheezing.        Objective:   Physical Exam  Constitutional: She appears well-developed and well-nourished.  HENT:  Right Ear: External ear normal.  Left Ear: External ear normal.  Nose: Nose normal.  Mouth/Throat: Oropharynx is clear and moist.  Eyes: Conjunctivae are normal.  Neck: No thyromegaly present.  Pulmonary/Chest: Effort normal. No respiratory distress. She has no wheezes. She has no rales.  Scattered rhonchi           Assessment & Plan:  Bronchitis, treat with Augmentin. Alysia Penna, MD

## 2016-09-17 NOTE — Progress Notes (Signed)
Pre visit review using our clinic review tool, if applicable. No additional management support is needed unless otherwise documented below in the visit note. 

## 2016-09-17 NOTE — Patient Instructions (Signed)
WE NOW OFFER   [facility] FAST TRACK!!!  SAME DAY Appointments for ACUTE CARE  Such as: Sprains, Injuries, cuts, abrasions, rashes, muscle pain, joint pain, back pain Colds, flu, sore throats, headache, allergies, cough, fever  Ear pain, sinus and eye infections Abdominal pain, nausea, vomiting, diarrhea, upset stomach Animal/insect bites  3 Easy Ways to Schedule: Walk-In Scheduling Call in scheduling Mychart Sign-up: [email or web address]         

## 2016-09-23 ENCOUNTER — Other Ambulatory Visit: Payer: Self-pay | Admitting: Internal Medicine

## 2016-09-23 DIAGNOSIS — Z1231 Encounter for screening mammogram for malignant neoplasm of breast: Secondary | ICD-10-CM

## 2016-10-03 ENCOUNTER — Encounter: Payer: Medicare Other | Admitting: Internal Medicine

## 2016-10-08 ENCOUNTER — Encounter: Payer: Self-pay | Admitting: Internal Medicine

## 2016-10-08 ENCOUNTER — Ambulatory Visit (INDEPENDENT_AMBULATORY_CARE_PROVIDER_SITE_OTHER): Payer: Medicare Other | Admitting: Internal Medicine

## 2016-10-08 VITALS — BP 148/70 | HR 56 | Temp 97.9°F | Ht 61.0 in | Wt 147.8 lb

## 2016-10-08 DIAGNOSIS — E034 Atrophy of thyroid (acquired): Secondary | ICD-10-CM | POA: Diagnosis not present

## 2016-10-08 DIAGNOSIS — Z86010 Personal history of colonic polyps: Secondary | ICD-10-CM

## 2016-10-08 DIAGNOSIS — I10 Essential (primary) hypertension: Secondary | ICD-10-CM

## 2016-10-08 DIAGNOSIS — Z0000 Encounter for general adult medical examination without abnormal findings: Secondary | ICD-10-CM | POA: Diagnosis not present

## 2016-10-08 DIAGNOSIS — E785 Hyperlipidemia, unspecified: Secondary | ICD-10-CM | POA: Diagnosis not present

## 2016-10-08 LAB — CBC WITH DIFFERENTIAL/PLATELET
BASOS ABS: 0 10*3/uL (ref 0.0–0.1)
BASOS PCT: 0.8 % (ref 0.0–3.0)
EOS ABS: 0.1 10*3/uL (ref 0.0–0.7)
EOS PCT: 3.7 % (ref 0.0–5.0)
HCT: 39.3 % (ref 36.0–46.0)
Hemoglobin: 13.1 g/dL (ref 12.0–15.0)
LYMPHS ABS: 0.9 10*3/uL (ref 0.7–4.0)
LYMPHS PCT: 23.5 % (ref 12.0–46.0)
MCHC: 33.4 g/dL (ref 30.0–36.0)
MCV: 92.9 fl (ref 78.0–100.0)
MONO ABS: 0.5 10*3/uL (ref 0.1–1.0)
Monocytes Relative: 12.6 % — ABNORMAL HIGH (ref 3.0–12.0)
Neutro Abs: 2.3 10*3/uL (ref 1.4–7.7)
Neutrophils Relative %: 59.4 % (ref 43.0–77.0)
Platelets: 174 10*3/uL (ref 150.0–400.0)
RBC: 4.23 Mil/uL (ref 3.87–5.11)
RDW: 13.9 % (ref 11.5–15.5)
WBC: 3.9 10*3/uL — AB (ref 4.0–10.5)

## 2016-10-08 LAB — COMPREHENSIVE METABOLIC PANEL
ALBUMIN: 4.3 g/dL (ref 3.5–5.2)
ALT: 13 U/L (ref 0–35)
AST: 10 U/L (ref 0–37)
Alkaline Phosphatase: 66 U/L (ref 39–117)
BUN: 20 mg/dL (ref 6–23)
CALCIUM: 10.4 mg/dL (ref 8.4–10.5)
CO2: 28 meq/L (ref 19–32)
Chloride: 106 mEq/L (ref 96–112)
Creatinine, Ser: 0.85 mg/dL (ref 0.40–1.20)
GFR: 67.26 mL/min (ref >60.00)
Glucose, Bld: 104 mg/dL — ABNORMAL HIGH (ref 70–99)
POTASSIUM: 4.2 meq/L (ref 3.5–5.1)
SODIUM: 141 meq/L (ref 135–145)
Total Bilirubin: 0.6 mg/dL (ref 0.2–1.2)
Total Protein: 6.3 g/dL (ref 6.0–8.3)

## 2016-10-08 LAB — LIPID PANEL
CHOL/HDL RATIO: 2
CHOLESTEROL: 188 mg/dL (ref 0–200)
HDL: 78.3 mg/dL (ref >39.00)
LDL CALC: 92 mg/dL (ref 0–99)
NonHDL: 109.79
Triglycerides: 88 mg/dL (ref 0.0–149.0)
VLDL: 17.6 mg/dL (ref 0.0–40.0)

## 2016-10-08 LAB — TSH: TSH: 2.54 u[IU]/mL (ref 0.35–4.50)

## 2016-10-08 NOTE — Progress Notes (Signed)
Subjective:    Patient ID: Janice Small, female    DOB: 04-Dec-1927, 83 y.o.   MRN: [id number]  HPI  83 year old patient who is seen today for a preventive health examination and subsequent Medicare wellness visit.  Medical problems include essential hypertension and hypothyroidism.  She is doing quite well without major concerns or complaints.  Denies any cardiopulmonary complaints  Past Medical History:  Diagnosis Date  . BREAST BIOPSY, HX OF 11/27/2006  . COLONIC POLYPS, HX OF 11/27/2006  . HEMORRHOIDS 01/17/2010  . HYPERTENSION 11/27/2006  . HYPOTHYROIDISM 11/27/2006  . MENOPAUSAL SYNDROME 11/27/2006     Social History   Social History  . Marital status: Married    Spouse name: N/A  . Number of children: N/A  . Years of education: N/A   Occupational History  . Not on file.   Social History Main Topics  . Smoking status: Never Smoker  . Smokeless tobacco: Never Used  . Alcohol use Yes     Comment: rarely  . Drug use: No  . Sexual activity: Not on file   Other Topics Concern  . Not on file   Social History Narrative  . No narrative on file    Past Surgical History:  Procedure Laterality Date  . ABDOMINAL HYSTERECTOMY    . APPENDECTOMY    . BREAST SURGERY     bx  . TONSILLECTOMY      Family History  Problem Relation Age of Onset  . Heart disease Father     Allergies  Allergen Reactions  . Codeine Phosphate     REACTION: unspecified    Current Outpatient Prescriptions on File Prior to Visit  Medication Sig Dispense Refill  . amLODipine (NORVASC) 10 MG tablet Take 1 tablet (10 mg total) by mouth daily. 90 tablet 3  . amoxicillin-clavulanate (AUGMENTIN) 875-125 MG tablet Take 1 tablet by mouth 2 (two) times daily. 20 tablet 0  . Calcium Carbonate (CALCIUM 500 PO) Take 1 tablet by mouth daily.      Marland Kitchen levothyroxine (SYNTHROID, LEVOTHROID) 75 MCG tablet Take 1 tablet (75 mcg total) by mouth daily. 90 tablet 1  . lisinopril (PRINIVIL,ZESTRIL) 10 MG tablet  Take 1 tablet (10 mg total) by mouth daily. 90 tablet 1  . metroNIDAZOLE (METROGEL) 0.75 % gel Apply 1 application topically at bedtime.     . Multiple Vitamin (MULTIVITAMIN) tablet Take 1 tablet by mouth daily.       No current facility-administered medications on file prior to visit.     BP (!) 148/70 (BP Location: Left Arm, Patient Position: Sitting, Cuff Size: Normal)   Pulse (!) 56   Temp 97.9 F (36.6 C) (Oral)   Ht 5\' 1"  (1.549 m)   Wt 147 lb 12.8 oz (67 kg)   SpO2 96%   BMI 27.93 kg/m   Medicare wellness visit  1. Risk factors, based on past  M,S,F history.  Cardiovascular risk factors include hypertension only.  Remote history tobacco use  2.  Physical activities:fairly active.  Does enjoy a Silver sneakers program  3.  Depression/mood:no history of major depression  4.  Hearing:denies any hearing deficits  5.  ADL's:independent  6.  Fall risk:low  7.  Home safety:no problems identified  8.  Height weight, and visual acuity;height and weight stable no change in visual acuity  9.  Counseling:continue heart healthy diet and exercise regimen  10. Lab orders based on risk factors:laboratory update will be reviewed including lipid profile  11. Referral :not  appropriate at this time  12. Care plan:continue efforts at aggressive risk factor modification  13. Cognitive assessment: alert and oriented with normal affect.  No cognitive dysfunction  14. Screening: Patient provided with a written and personalized 5-10 year screening schedule in the AVS.    15. Provider List Update: Primary care ophthalmology    Review of Systems  Constitutional: Negative.   HENT: Negative for congestion, dental problem, hearing loss, rhinorrhea, sinus pressure, sore throat and tinnitus.   Eyes: Negative for pain, discharge and visual disturbance.  Respiratory: Negative for cough and shortness of breath.   Cardiovascular: Negative for chest pain, palpitations and leg swelling.    Gastrointestinal: Negative for abdominal distention, abdominal pain, blood in stool, constipation, diarrhea, nausea and vomiting.  Genitourinary: Negative for difficulty urinating, dysuria, flank pain, frequency, hematuria, pelvic pain, urgency, vaginal bleeding, vaginal discharge and vaginal pain.  Musculoskeletal: Negative for arthralgias, gait problem and joint swelling.  Skin: Negative for rash.  Neurological: Negative for dizziness, syncope, speech difficulty, weakness, numbness and headaches.  Hematological: Negative for adenopathy.  Psychiatric/Behavioral: Negative for agitation, behavioral problems and dysphoric mood. The patient is not nervous/anxious.        Objective:   Physical Exam  Constitutional: She is oriented to person, place, and time. She appears well-developed and well-nourished.  HENT:  Head: Normocephalic.  Right Ear: External ear normal.  Left Ear: External ear normal.  Mouth/Throat: Oropharynx is clear and moist.  Eyes: Conjunctivae and EOM are normal. Pupils are equal, round, and reactive to light.  Neck: Normal range of motion. Neck supple. No thyromegaly present.  Cardiovascular: Normal rate, normal heart sounds and intact distal pulses.   Few ectopics  Pulmonary/Chest: Effort normal and breath sounds normal.  Abdominal: Soft. Bowel sounds are normal. She exhibits no mass. There is no tenderness.  Musculoskeletal: Normal range of motion.  Lymphadenopathy:    She has no cervical adenopathy.  Neurological: She is alert and oriented to person, place, and time.  Skin: Skin is warm and dry. No rash noted.  Psychiatric: She has a normal mood and affect. Her behavior is normal.          Assessment & Plan:   Preventive health examination Subsequent  medical wellness visit Essential hypertension, stable Hypothyroidism.  We'll review TSH History of hypercalcemia.  We'll check the chemistries  No change in medical regimen Check updated lab Follow-up 6  months  Romayne Ticas Pilar Plate

## 2016-10-08 NOTE — Patient Instructions (Signed)
Limit your sodium (Salt) intake  Please check your blood pressure on a regular basis.  If it is consistently greater than 150/90, please make an office appointment.    It is important that you exercise regularly, at least 20 minutes 3 to 4 times per week.  If you develop chest pain or shortness of breath seek  medical attention.  Take a calcium supplement, plus 800-1200 units of vitamin D 

## 2016-10-08 NOTE — Progress Notes (Signed)
Pre visit review using our clinic review tool, if applicable. No additional management support is needed unless otherwise documented below in the visit note. 

## 2016-10-13 ENCOUNTER — Other Ambulatory Visit: Payer: Self-pay | Admitting: Internal Medicine

## 2016-10-17 ENCOUNTER — Ambulatory Visit
Admission: RE | Admit: 2016-10-17 | Discharge: 2016-10-17 | Disposition: A | Discharge status: Home / Self Care | Payer: Medicare Other | Source: Ambulatory Visit | Attending: Internal Medicine | Admitting: Internal Medicine

## 2016-10-17 DIAGNOSIS — Z1231 Encounter for screening mammogram for malignant neoplasm of breast: Secondary | ICD-10-CM

## 2017-04-10 ENCOUNTER — Encounter: Payer: Self-pay | Admitting: Internal Medicine

## 2017-04-10 ENCOUNTER — Ambulatory Visit (INDEPENDENT_AMBULATORY_CARE_PROVIDER_SITE_OTHER): Payer: Medicare Other | Admitting: Internal Medicine

## 2017-04-10 VITALS — BP 120/72 | HR 80 | Temp 98.0°F | Ht 61.0 in | Wt 149.0 lb

## 2017-04-10 DIAGNOSIS — Z23 Encounter for immunization: Secondary | ICD-10-CM

## 2017-04-10 DIAGNOSIS — I10 Essential (primary) hypertension: Secondary | ICD-10-CM | POA: Diagnosis not present

## 2017-04-10 DIAGNOSIS — E039 Hypothyroidism, unspecified: Secondary | ICD-10-CM | POA: Diagnosis not present

## 2017-04-10 DIAGNOSIS — Z86010 Personal history of colonic polyps: Secondary | ICD-10-CM

## 2017-04-10 MED ORDER — AMLODIPINE BESYLATE 10 MG PO TABS: 10.0000 mg | ORAL_TABLET | Freq: Every day | ORAL | 3 refills | Status: DC

## 2017-04-10 MED ORDER — LISINOPRIL 10 MG PO TABS: 10.0000 mg | ORAL_TABLET | Freq: Every day | ORAL | 4 refills | Status: DC

## 2017-04-10 MED ORDER — LEVOTHYROXINE SODIUM 75 MCG PO TABS: 75.0000 ug | ORAL_TABLET | Freq: Every day | ORAL | 4 refills | Status: DC

## 2017-04-10 NOTE — Progress Notes (Signed)
Subjective:    Patient ID: Janice Small, female    DOB: November 01, 1927, 83 y.o.   MRN: [id number]  HPI  83 year old patient who is seen today for her semiannual follow-up. She has a history of essential hypertension which has been well-controlled.  She has hypothyroidism and remains on levothyroxine supplementation. There has been a history of mild hypercalcemia but last calcium level was high normal. No concerns or complaints.  Flu vaccine administered today  Past Medical History:  Diagnosis Date  . BREAST BIOPSY, HX OF 11/27/2006  . COLONIC POLYPS, HX OF 11/27/2006  . HEMORRHOIDS 01/17/2010  . HYPERTENSION 11/27/2006  . HYPOTHYROIDISM 11/27/2006  . MENOPAUSAL SYNDROME 11/27/2006     Social History   Social History  . Marital status: Married    Spouse name: N/A  . Number of children: N/A  . Years of education: N/A   Occupational History  . Not on file.   Social History Main Topics  . Smoking status: Never Smoker  . Smokeless tobacco: Never Used  . Alcohol use Yes     Comment: rarely  . Drug use: No  . Sexual activity: Not on file   Other Topics Concern  . Not on file   Social History Narrative  . No narrative on file    Past Surgical History:  Procedure Laterality Date  . ABDOMINAL HYSTERECTOMY    . APPENDECTOMY    . BREAST EXCISIONAL BIOPSY Right 1987  . BREAST SURGERY     bx  . TONSILLECTOMY      Family History  Problem Relation Age of Onset  . Heart disease Father     Allergies  Allergen Reactions  . Codeine Phosphate     REACTION: unspecified    Current Outpatient Prescriptions on File Prior to Visit  Medication Sig Dispense Refill  . amLODipine (NORVASC) 10 MG tablet Take 1 tablet (10 mg total) by mouth daily. 90 tablet 3  . Calcium Carbonate (CALCIUM 500 PO) Take 1 tablet by mouth daily.      Marland Kitchen levothyroxine (SYNTHROID, LEVOTHROID) 75 MCG tablet TAKE 1 TABLET DAILY 90 tablet 1  . lisinopril (PRINIVIL,ZESTRIL) 10 MG tablet TAKE 1 TABLET DAILY  90 tablet 1  . metroNIDAZOLE (METROGEL) 0.75 % gel Apply 1 application topically at bedtime.     . Multiple Vitamin (MULTIVITAMIN) tablet Take 1 tablet by mouth daily.       No current facility-administered medications on file prior to visit.     BP (!) 158/78 (BP Location: Left Arm, Patient Position: Sitting, Cuff Size: Normal)   Pulse 80   Temp 98 F (36.7 C) (Oral)   Ht 5\' 1"  (1.549 m)   Wt 149 lb (67.6 kg)   SpO2 98%   BMI 28.15 kg/m     Review of Systems  Constitutional: Negative.   HENT: Negative for congestion, dental problem, hearing loss, rhinorrhea, sinus pressure, sore throat and tinnitus.   Eyes: Negative for pain, discharge and visual disturbance.  Respiratory: Negative for cough and shortness of breath.   Cardiovascular: Negative for chest pain, palpitations and leg swelling.  Gastrointestinal: Negative for abdominal distention, abdominal pain, blood in stool, constipation, diarrhea, nausea and vomiting.  Genitourinary: Negative for difficulty urinating, dysuria, flank pain, frequency, hematuria, pelvic pain, urgency, vaginal bleeding, vaginal discharge and vaginal pain.  Musculoskeletal: Negative for arthralgias, gait problem and joint swelling.  Skin: Negative for rash.  Neurological: Negative for dizziness, syncope, speech difficulty, weakness, numbness and headaches.  Hematological: Negative for adenopathy.  Psychiatric/Behavioral: Negative for agitation, behavioral problems and dysphoric mood. The patient is not nervous/anxious.        Objective:   Physical Exam  Constitutional: She is oriented to person, place, and time. She appears well-developed and well-nourished.  Repeat blood pressure 120/72  HENT:  Head: Normocephalic.  Right Ear: External ear normal.  Left Ear: External ear normal.  Mouth/Throat: Oropharynx is clear and moist.  Eyes: Pupils are equal, round, and reactive to light. Conjunctivae and EOM are normal.  Neck: Normal range of motion.  Neck supple. No thyromegaly present.  Cardiovascular: Normal rate, regular rhythm, normal heart sounds and intact distal pulses.   Pulmonary/Chest: Effort normal and breath sounds normal.  Abdominal: Soft. Bowel sounds are normal. She exhibits no mass. There is no tenderness.  Musculoskeletal: Normal range of motion.  Lymphadenopathy:    She has no cervical adenopathy.  Neurological: She is alert and oriented to person, place, and time.  Skin: Skin is warm and dry. No rash noted.  Psychiatric: She has a normal mood and affect. Her behavior is normal.          Assessment & Plan:   Essential hypertension.  Well-controlled.  No change in therapy.  Medications refilled History of hypercalcemia Hypothyroidism  Flu vaccine administered All medications refilled Follow-up 6 months  Janice Small Pilar Plate

## 2017-04-10 NOTE — Patient Instructions (Signed)
Limit your sodium (Salt) intake    It is important that you exercise regularly, at least 20 minutes 3 to 4 times per week.  If you develop chest pain or shortness of breath seek  medical attention.  Please check your blood pressure on a regular basis.  If it is consistently greater than 150/90, please make an office appointment.  Return in 6 months for follow-up   

## 2017-04-17 ENCOUNTER — Encounter: Payer: Self-pay | Admitting: Adult Health

## 2017-04-17 ENCOUNTER — Ambulatory Visit (INDEPENDENT_AMBULATORY_CARE_PROVIDER_SITE_OTHER): Payer: Medicare Other | Admitting: Adult Health

## 2017-04-17 VITALS — BP 166/74 | Temp 98.5°F | Ht 61.0 in | Wt 148.0 lb

## 2017-04-17 DIAGNOSIS — L853 Xerosis cutis: Secondary | ICD-10-CM | POA: Diagnosis not present

## 2017-04-17 NOTE — Progress Notes (Signed)
Subjective:    Patient ID: Janice Small, female    DOB: [birth year], 83 y.o.   MRN: [id number]  HPI  83 year old female who  has a past medical history of BREAST BIOPSY, HX OF (11/27/2006); COLONIC POLYPS, HX OF (11/27/2006); HEMORRHOIDS (01/17/2010); HYPERTENSION (11/27/2006); HYPOTHYROIDISM (11/27/2006); and MENOPAUSAL SYNDROME (11/27/2006).  She presents to the office today for an acute complaint of a " rash in my butt crack". She reports having this rash for the last 2-3 days. Denies itching. More of an irritation when she sits.   Has not noticed any drainage.   Review of Systems See HPI   Past Medical History:  Diagnosis Date  . BREAST BIOPSY, HX OF 11/27/2006  . COLONIC POLYPS, HX OF 11/27/2006  . HEMORRHOIDS 01/17/2010  . HYPERTENSION 11/27/2006  . HYPOTHYROIDISM 11/27/2006  . MENOPAUSAL SYNDROME 11/27/2006    Social History   Social History  . Marital status: Married    Spouse name: N/A  . Number of children: N/A  . Years of education: N/A   Occupational History  . Not on file.   Social History Main Topics  . Smoking status: Never Smoker  . Smokeless tobacco: Never Used  . Alcohol use Yes     Comment: rarely  . Drug use: No  . Sexual activity: Not on file   Other Topics Concern  . Not on file   Social History Narrative  . No narrative on file    Past Surgical History:  Procedure Laterality Date  . ABDOMINAL HYSTERECTOMY    . APPENDECTOMY    . BREAST EXCISIONAL BIOPSY Right 1987  . BREAST SURGERY     bx  . TONSILLECTOMY      Family History  Problem Relation Age of Onset  . Heart disease Father     Allergies  Allergen Reactions  . Codeine Phosphate     REACTION: unspecified    Current Outpatient Prescriptions on File Prior to Visit  Medication Sig Dispense Refill  . amLODipine (NORVASC) 10 MG tablet Take 1 tablet (10 mg total) by mouth daily. 90 tablet 3  . levothyroxine (SYNTHROID, LEVOTHROID) 75 MCG tablet Take 1 tablet (75 mcg total) by mouth  daily. 90 tablet 4  . lisinopril (PRINIVIL,ZESTRIL) 10 MG tablet Take 1 tablet (10 mg total) by mouth daily. 90 tablet 4  . metroNIDAZOLE (METROGEL) 0.75 % gel Apply 1 application topically at bedtime.     . Multiple Vitamin (MULTIVITAMIN) tablet Take 1 tablet by mouth daily.       No current facility-administered medications on file prior to visit.     BP (!) 166/74 (BP Location: Left Arm)   Temp 98.5 F (36.9 C) (Oral)   Ht 5\' 1"  (1.549 m)   Wt 148 lb (67.1 kg)   BMI 27.96 kg/m       Objective:   Physical Exam  Constitutional: She is oriented to person, place, and time. She appears well-developed and well-nourished. No distress.  Cardiovascular: Normal rate, regular rhythm, normal heart sounds and intact distal pulses.  Exam reveals no gallop and no friction rub.   No murmur heard. Pulmonary/Chest: Effort normal and breath sounds normal. No respiratory distress. She has no wheezes. She has no rales. She exhibits no tenderness.  Neurological: She is alert and oriented to person, place, and time.  Skin: Skin is warm and dry. Rash (mild red rash noted on intergluteal cleft,) noted. She is not diaphoretic.  Psychiatric: She has a normal mood and affect.  Her behavior is normal. Thought content normal.  Nursing note and vitals reviewed.      Assessment & Plan:  1. Dry skin dermatitis - Does not appears as contact dermatitis. Advised thin layer of OTC hydrocortisone and a good moisturizer  - Follow up as needed  Dorothyann Peng, NP

## 2017-09-17 ENCOUNTER — Other Ambulatory Visit: Payer: Self-pay | Admitting: Internal Medicine

## 2017-09-17 DIAGNOSIS — Z1231 Encounter for screening mammogram for malignant neoplasm of breast: Secondary | ICD-10-CM

## 2017-10-12 ENCOUNTER — Ambulatory Visit (INDEPENDENT_AMBULATORY_CARE_PROVIDER_SITE_OTHER): Payer: Medicare Other | Admitting: Internal Medicine

## 2017-10-12 ENCOUNTER — Encounter: Payer: Self-pay | Admitting: Internal Medicine

## 2017-10-12 VITALS — BP 122/70 | HR 75 | Temp 98.0°F | Ht 61.0 in | Wt 149.0 lb

## 2017-10-12 DIAGNOSIS — Z86010 Personal history of colonic polyps: Secondary | ICD-10-CM | POA: Diagnosis not present

## 2017-10-12 DIAGNOSIS — E039 Hypothyroidism, unspecified: Secondary | ICD-10-CM | POA: Diagnosis not present

## 2017-10-12 DIAGNOSIS — I10 Essential (primary) hypertension: Secondary | ICD-10-CM

## 2017-10-12 DIAGNOSIS — E785 Hyperlipidemia, unspecified: Secondary | ICD-10-CM | POA: Diagnosis not present

## 2017-10-12 DIAGNOSIS — Z0000 Encounter for general adult medical examination without abnormal findings: Secondary | ICD-10-CM | POA: Diagnosis not present

## 2017-10-12 LAB — LIPID PANEL
CHOL/HDL RATIO: 2
Cholesterol: 178 mg/dL (ref 0–200)
HDL: 85.2 mg/dL (ref >39.00)
LDL CALC: 79 mg/dL (ref 0–99)
NONHDL: 92.37
TRIGLYCERIDES: 69 mg/dL (ref 0.0–149.0)
VLDL: 13.8 mg/dL (ref 0.0–40.0)

## 2017-10-12 LAB — COMPREHENSIVE METABOLIC PANEL
ALT: 13 U/L (ref 0–35)
AST: 9 U/L (ref 0–37)
Albumin: 4.3 g/dL (ref 3.5–5.2)
Alkaline Phosphatase: 59 U/L (ref 39–117)
BUN: 25 mg/dL — ABNORMAL HIGH (ref 6–23)
CALCIUM: 10.6 mg/dL — AB (ref 8.4–10.5)
CHLORIDE: 106 meq/L (ref 96–112)
CO2: 28 meq/L (ref 19–32)
Creatinine, Ser: 0.74 mg/dL (ref 0.40–1.20)
GFR: 78.74 mL/min (ref >60.00)
GLUCOSE: 103 mg/dL — AB (ref 70–99)
POTASSIUM: 4 meq/L (ref 3.5–5.1)
Sodium: 142 mEq/L (ref 135–145)
Total Bilirubin: 0.7 mg/dL (ref 0.2–1.2)
Total Protein: 6.4 g/dL (ref 6.0–8.3)

## 2017-10-12 LAB — CBC WITH DIFFERENTIAL/PLATELET
BASOS ABS: 0 10*3/uL (ref 0.0–0.1)
BASOS PCT: 1 % (ref 0.0–3.0)
EOS PCT: 1.8 % (ref 0.0–5.0)
Eosinophils Absolute: 0.1 10*3/uL (ref 0.0–0.7)
HCT: 40.4 % (ref 36.0–46.0)
Hemoglobin: 13.5 g/dL (ref 12.0–15.0)
LYMPHS ABS: 1.2 10*3/uL (ref 0.7–4.0)
LYMPHS PCT: 30 % (ref 12.0–46.0)
MCHC: 33.5 g/dL (ref 30.0–36.0)
MCV: 95.7 fl (ref 78.0–100.0)
MONOS PCT: 9.6 % (ref 3.0–12.0)
Monocytes Absolute: 0.4 10*3/uL (ref 0.1–1.0)
NEUTROS ABS: 2.3 10*3/uL (ref 1.4–7.7)
Neutrophils Relative %: 57.6 % (ref 43.0–77.0)
PLATELETS: 136 10*3/uL — AB (ref 150.0–400.0)
RBC: 4.23 Mil/uL (ref 3.87–5.11)
RDW: 13.5 % (ref 11.5–15.5)
WBC: 4 10*3/uL (ref 4.0–10.5)

## 2017-10-12 LAB — TSH: TSH: 2.15 u[IU]/mL (ref 0.35–4.50)

## 2017-10-12 NOTE — Progress Notes (Signed)
Subjective:    Patient ID: Janice Small, female    DOB: 09-Apr-1928, 84 y.o.   MRN: [id number]  HPI 84 year old patient who is seen today for an annual exam as well as subsequent Medicare wellness visit She continues to do quite well.  She does have a history of essential hypertension.  She has treated hypothyroidism. She has a history of intermittent hypercalcemia.  She apparently continues to take supplemental calcium as well as multivitamins. She is scheduled for a follow-up mammogram next week  No new concerns or complaints  Past Medical History:  Diagnosis Date  . BREAST BIOPSY, HX OF 11/27/2006  . COLONIC POLYPS, HX OF 11/27/2006  . HEMORRHOIDS 01/17/2010  . HYPERTENSION 11/27/2006  . HYPOTHYROIDISM 11/27/2006  . MENOPAUSAL SYNDROME 11/27/2006     Social History   Socioeconomic History  . Marital status: Married    Spouse name: Not on file  . Number of children: Not on file  . Years of education: Not on file  . Highest education level: Not on file  Occupational History  . Not on file  Social Needs  . Financial resource strain: Not on file  . Food insecurity:    Worry: Not on file    Inability: Not on file  . Transportation needs:    Medical: Not on file    Non-medical: Not on file  Tobacco Use  . Smoking status: Never Smoker  . Smokeless tobacco: Never Used  Substance and Sexual Activity  . Alcohol use: Yes    Comment: rarely  . Drug use: No  . Sexual activity: Not on file  Lifestyle  . Physical activity:    Days per week: Not on file    Minutes per session: Not on file  . Stress: Not on file  Relationships  . Social connections:    Talks on phone: Not on file    Gets together: Not on file    Attends religious service: Not on file    Active member of club or organization: Not on file    Attends meetings of clubs or organizations: Not on file    Relationship status: Not on file  . Intimate partner violence:    Fear of current or ex partner: Not on file      Emotionally abused: Not on file    Physically abused: Not on file    Forced sexual activity: Not on file  Other Topics Concern  . Not on file  Social History Narrative  . Not on file    Past Surgical History:  Procedure Laterality Date  . ABDOMINAL HYSTERECTOMY    . APPENDECTOMY    . BREAST EXCISIONAL BIOPSY Right 1987  . BREAST SURGERY     bx  . TONSILLECTOMY      Family History  Problem Relation Age of Onset  . Heart disease Father     Allergies  Allergen Reactions  . Codeine Phosphate     REACTION: unspecified    Current Outpatient Medications on File Prior to Visit  Medication Sig Dispense Refill  . amLODipine (NORVASC) 10 MG tablet Take 1 tablet (10 mg total) by mouth daily. 90 tablet 3  . levothyroxine (SYNTHROID, LEVOTHROID) 75 MCG tablet Take 1 tablet (75 mcg total) by mouth daily. 90 tablet 4  . lisinopril (PRINIVIL,ZESTRIL) 10 MG tablet Take 1 tablet (10 mg total) by mouth daily. 90 tablet 4  . metroNIDAZOLE (METROGEL) 0.75 % gel Apply 1 application topically at bedtime.     Marland Kitchen  Multiple Vitamin (MULTIVITAMIN) tablet Take 1 tablet by mouth daily.       No current facility-administered medications on file prior to visit.     BP 122/70 (BP Location: Left Arm, Patient Position: Sitting, Cuff Size: Large)   Pulse 75   Temp 98 F (36.7 C) (Oral)   Ht 5\' 1"  (1.549 m)   Wt 149 lb (67.6 kg)   SpO2 98%   BMI 28.15 kg/m   Subsequent Medicare wellness visit  1. Risk factors, based on past  M,S,F history.  Cardiovascular risk factors include a history of hypertension only  2.  Physical activities: Remains fairly active for her age.  Participates in a Silver sneakers program  3.  Depression/mood: No history of mood disorder or major depression  4.  Hearing: No major issues.  No use of hearing aids  5.  ADL's: Remains independent  6.  Fall risk: Moderate due to age but no falls over the past year  7.  Home safety: No problems identified  8.  Height  weight, and visual acuity; height and weight stable no change in visual acuity  9.  Counseling: Continue active lifestyle and heart healthy diet.  10. Lab orders based on risk factors:  Laboratory update will be reviewed 11. Referral : Follow-up mammogram as scheduled  12. Care plan: Continue efforts at aggressive risk factor modification  13. Cognitive assessment: Alert and oriented with normal affect.  No cognitive dysfunction  14. Screening: Patient provided with a written and personalized 5-10 year screening schedule in the AVS.    15. Provider List Update:  Primary care radiology and ophthalmology    Review of Systems  Constitutional: Negative.   HENT: Negative for congestion, dental problem, hearing loss, rhinorrhea, sinus pressure, sore throat and tinnitus.   Eyes: Negative for pain, discharge and visual disturbance.  Respiratory: Negative for cough and shortness of breath.   Cardiovascular: Negative for chest pain, palpitations and leg swelling.  Gastrointestinal: Negative for abdominal distention, abdominal pain, blood in stool, constipation, diarrhea, nausea and vomiting.  Genitourinary: Negative for difficulty urinating, dysuria, flank pain, frequency, hematuria, pelvic pain, urgency, vaginal bleeding, vaginal discharge and vaginal pain.  Musculoskeletal: Negative for arthralgias, gait problem and joint swelling.  Skin: Negative for rash.  Neurological: Negative for dizziness, syncope, speech difficulty, weakness, numbness and headaches.  Hematological: Negative for adenopathy.  Psychiatric/Behavioral: Negative for agitation, behavioral problems and dysphoric mood. The patient is not nervous/anxious.        Objective:   Physical Exam  Constitutional: She is oriented to person, place, and time. She appears well-developed and well-nourished.  HENT:  Head: Normocephalic and atraumatic.  Right Ear: External ear normal.  Left Ear: External ear normal.  Mouth/Throat:  Oropharynx is clear and moist.  Eyes: Conjunctivae and EOM are normal.  Neck: Normal range of motion. Neck supple. No JVD present. No thyromegaly present.  Cardiovascular: Normal rate, regular rhythm, normal heart sounds and intact distal pulses.  No murmur heard. Pulmonary/Chest: Effort normal and breath sounds normal. She has no wheezes. She has no rales.  Abdominal: Soft. Bowel sounds are normal. She exhibits no distension and no mass. There is no tenderness. There is no rebound and no guarding.  Musculoskeletal: Normal range of motion. She exhibits no edema or tenderness.  Neurological: She is alert and oriented to person, place, and time. She has normal reflexes. No cranial nerve deficit. She exhibits normal muscle tone. Coordination normal.  Skin: Skin is warm and dry. No rash  noted.  Psychiatric: She has a normal mood and affect. Her behavior is normal.          Assessment & Plan:   Preventive health examination Subsequent Medicare wellness visit History of hypercalcemia.  Calcium supplements will be discontinued Essential hypertension.  Well controlled Hypothyroidism.  Will review a TSH  Review updated lab Mammogram as scheduled Follow-up 6 months or as needed  Nyoka Cowden

## 2017-10-12 NOTE — Patient Instructions (Signed)
Annual mammogram as scheduled  Discontinue calcium supplements  Continue your exercise program  Please check your blood pressure on a regular basis.  If it is consistently greater than 150/90, please make an office appointment.  Return in 6 months for follow-up

## 2017-10-19 ENCOUNTER — Ambulatory Visit
Admission: RE | Admit: 2017-10-19 | Discharge: 2017-10-19 | Disposition: A | Discharge status: Home / Self Care | Payer: Medicare Other | Source: Ambulatory Visit | Attending: Internal Medicine | Admitting: Internal Medicine

## 2017-10-19 DIAGNOSIS — Z1231 Encounter for screening mammogram for malignant neoplasm of breast: Secondary | ICD-10-CM

## 2017-12-25 ENCOUNTER — Telehealth: Payer: Self-pay

## 2017-12-25 NOTE — Telephone Encounter (Signed)
Copied from [place] [id number]. Topic: Appointment Scheduling - Scheduling Inquiry for Clinic >> Dec 25, 2017  2:15 PM Conception Chancy, NT wrote: Reason for CRM: patient is calling and would like to get Dr. Raliegh Ip recommendations on who she should switch care with once he retires. Patient would like a call back from him. Please advise.

## 2017-12-27 NOTE — Telephone Encounter (Signed)
Notify patient there are many options discussed in the letter she received.  Dr. Martinique would be an excellent choice

## 2017-12-28 NOTE — Telephone Encounter (Signed)
Recommendations given. Pt will call in Aug/Sept to schedule TOC. Nothing further needed at this time.

## 2018-01-27 ENCOUNTER — Encounter: Payer: Self-pay | Admitting: Internal Medicine

## 2018-01-27 ENCOUNTER — Ambulatory Visit: Payer: Medicare Other | Admitting: Internal Medicine

## 2018-01-27 VITALS — BP 120/68 | HR 89 | Temp 98.9°F | Wt 148.8 lb

## 2018-01-27 DIAGNOSIS — E039 Hypothyroidism, unspecified: Secondary | ICD-10-CM

## 2018-01-27 DIAGNOSIS — M542 Cervicalgia: Secondary | ICD-10-CM

## 2018-01-27 DIAGNOSIS — I10 Essential (primary) hypertension: Secondary | ICD-10-CM

## 2018-01-27 MED ORDER — METHYLPREDNISOLONE ACETATE 40 MG/ML IJ SUSP
40.0000 mg | Freq: Once | INTRAMUSCULAR | Status: AC
  Administered 2018-01-27: 40 mg via INTRAMUSCULAR

## 2018-01-27 NOTE — Progress Notes (Signed)
Subjective:    Patient ID: Janice Small, female    DOB: 08/04/1927, 84 y.o.   MRN: [id number]  HPI  84 year old patient who was seen by ophthalmology 7 days ago.  She states that during the course of the examination she was placed in an uncomfortable position and the following day began having right posterior neck pain.  Pain is sometimes aggravated by movement with some discomfort down the right arm to the right third digit.  She states that she has had a similar issue a number of years ago. At the present time she states that she is pain-free because she has been using some ibuprofen.  She states that she has slept poorly for the past few nights due to the pain  Past Medical History:  Diagnosis Date  . BREAST BIOPSY, HX OF 11/27/2006  . COLONIC POLYPS, HX OF 11/27/2006  . HEMORRHOIDS 01/17/2010  . HYPERTENSION 11/27/2006  . HYPOTHYROIDISM 11/27/2006  . MENOPAUSAL SYNDROME 11/27/2006     Social History   Socioeconomic History  . Marital status: Married    Spouse name: Not on file  . Number of children: Not on file  . Years of education: Not on file  . Highest education level: Not on file  Occupational History  . Not on file  Social Needs  . Financial resource strain: Not on file  . Food insecurity:    Worry: Not on file    Inability: Not on file  . Transportation needs:    Medical: Not on file    Non-medical: Not on file  Tobacco Use  . Smoking status: Never Smoker  . Smokeless tobacco: Never Used  Substance and Sexual Activity  . Alcohol use: Yes    Comment: rarely  . Drug use: No  . Sexual activity: Not on file  Lifestyle  . Physical activity:    Days per week: Not on file    Minutes per session: Not on file  . Stress: Not on file  Relationships  . Social connections:    Talks on phone: Not on file    Gets together: Not on file    Attends religious service: Not on file    Active member of club or organization: Not on file    Attends meetings of clubs or  organizations: Not on file    Relationship status: Not on file  . Intimate partner violence:    Fear of current or ex partner: Not on file    Emotionally abused: Not on file    Physically abused: Not on file    Forced sexual activity: Not on file  Other Topics Concern  . Not on file  Social History Narrative  . Not on file    Past Surgical History:  Procedure Laterality Date  . ABDOMINAL HYSTERECTOMY    . APPENDECTOMY    . BREAST EXCISIONAL BIOPSY Right 1987  . BREAST SURGERY     bx  . TONSILLECTOMY      Family History  Problem Relation Age of Onset  . Heart disease Father     Allergies  Allergen Reactions  . Codeine Phosphate     REACTION: unspecified    Current Outpatient Medications on File Prior to Visit  Medication Sig Dispense Refill  . amLODipine (NORVASC) 10 MG tablet Take 1 tablet (10 mg total) by mouth daily. 90 tablet 3  . levothyroxine (SYNTHROID, LEVOTHROID) 75 MCG tablet Take 1 tablet (75 mcg total) by mouth daily. 90 tablet 4  . lisinopril (  PRINIVIL,ZESTRIL) 10 MG tablet Take 1 tablet (10 mg total) by mouth daily. 90 tablet 4  . metroNIDAZOLE (METROGEL) 0.75 % gel Apply 1 application topically at bedtime.     . Multiple Vitamin (MULTIVITAMIN) tablet Take 1 tablet by mouth daily.       No current facility-administered medications on file prior to visit.     BP 120/68 (BP Location: Right Arm, Patient Position: Sitting, Cuff Size: Normal)   Pulse 89   Temp 98.9 F (37.2 C) (Oral)   Wt 148 lb 12.8 oz (67.5 kg)   SpO2 95%   BMI 28.12 kg/m     Review of Systems  Constitutional: Negative.   HENT: Negative for congestion, dental problem, hearing loss, rhinorrhea, sinus pressure, sore throat and tinnitus.   Eyes: Negative for pain, discharge and visual disturbance.  Respiratory: Negative for cough and shortness of breath.   Cardiovascular: Negative for chest pain, palpitations and leg swelling.  Gastrointestinal: Negative for abdominal distention,  abdominal pain, blood in stool, constipation, diarrhea, nausea and vomiting.  Genitourinary: Negative for difficulty urinating, dysuria, flank pain, frequency, hematuria, pelvic pain, urgency, vaginal bleeding, vaginal discharge and vaginal pain.  Musculoskeletal: Positive for neck pain and neck stiffness. Negative for arthralgias, gait problem and joint swelling.  Skin: Negative for rash.  Neurological: Negative for dizziness, syncope, speech difficulty, weakness, numbness and headaches.  Hematological: Negative for adenopathy.  Psychiatric/Behavioral: Negative for agitation, behavioral problems and dysphoric mood. The patient is not nervous/anxious.        Objective:   Physical Exam  Constitutional: She is oriented to person, place, and time. She appears well-developed and well-nourished.  Eyes: Pupils are equal, round, and reactive to light. Conjunctivae and EOM are normal.  Neck: Normal range of motion. Neck supple. No thyromegaly present.  Range of motion the neck appeared to be fairly intact.Did not produce any radicular symptoms  Cardiovascular: Normal rate, regular rhythm, normal heart sounds and intact distal pulses.  Abdominal: She exhibits no mass. There is no tenderness.  Lymphadenopathy:    She has no cervical adenopathy.  Neurological: She is alert and oriented to person, place, and time.  Skin: No rash noted.  Psychiatric: She has a normal mood and affect. Her behavior is normal.          Assessment & Plan:   Cervical pain.  Possible mild cervical radiculopathy.  Patient will continue ibuprofen which has been quite helpful.  Will treat with Depo-Medrol 40 mg IM.  Patient will consider a soft cervical collar. Essential hypertension stable  Marletta Lor

## 2018-01-27 NOTE — Patient Instructions (Addendum)
Continue ibuprofen 400 mg 3-4 times daily Consider a soft cervical collar  Call or return to clinic prn if these symptoms worsen or fail to improve as anticipated.  Cervical Collar A cervical collar is a device that supports your chin and the back of your head. It is used after a severe neck injury to protect your head and neck. It does this by restricting the movement of the top part of your spine, which is located in your neck. A cervical collar may be used when you have:  A fractured neck.  Ligament damage.  A spinal cord injury.  What instructions should I follow?  Wear the collar for as long as your health care provider instructs.  Follow your health care provider's instructions about how to put on and take off your collar.  Do not make your collar so tight that you feel pain or it is hard for you to breathe.  Do not remove the collar unless your health care provider says it is okay. Ask your health care provider if you can remove the collar for showering or eating or to apply ice.  Do not drive a car until your health care provider says it is okay.  Keep all follow-up visits as directed by your health care provider. This is important. Any delay in getting necessary care can keep your injury from healing properly.  Apply ice to the injured area: ? Put ice in a plastic bag. ? Place a towel between your skin and the bag. ? Leave the ice on for 20 minutes, 2-3 times per day for the first 2 days. This information is not intended to replace advice given to you by your health care provider. Make sure you discuss any questions you have with your health care provider. Document Released: 03/22/2004 Document Revised: 11/08/2015 Document Reviewed: 02/06/2014 Elsevier Interactive Patient Education  2018 [employer].  Cervical Radiculopathy Cervical radiculopathy means that a nerve in the neck is pinched or bruised. This can cause pain or loss of feeling (numbness) that runs from your  neck to your arm and fingers. Follow these instructions at home: Managing pain  Take over-the-counter and prescription medicines only as told by your doctor.  If directed, put ice on the injured or painful area. ? Put ice in a plastic bag. ? Place a towel between your skin and the bag. ? Leave the ice on for 20 minutes, 2-3 times per day.  If ice does not help, you can try using heat. Take a warm shower or warm bath, or use a heat pack as told by your doctor.  You may try a gentle neck and shoulder massage. Activity  Rest as needed. Follow instructions from your doctor about any activities to avoid.  Do exercises as told by your doctor or physical therapist. General instructions  If you were given a soft collar, wear it as told by your doctor.  Use a flat pillow when you sleep.  Keep all follow-up visits as told by your doctor. This is important. Contact a doctor if:  Your condition does not improve with treatment. Get help right away if:  Your pain gets worse and is not controlled with medicine.  You lose feeling or feel weak in your hand, arm, face, or leg.  You have a fever.  You have a stiff neck.  You cannot control when you poop or pee (have incontinence).  You have trouble with walking, balance, or talking. This information is not intended to replace  advice given to you by your health care provider. Make sure you discuss any questions you have with your health care provider. Document Released: 06/19/2011 Document Revised: 12/06/2015 Document Reviewed: 08/24/2014 Elsevier Interactive Patient Education  [employer].

## 2018-03-01 ENCOUNTER — Telehealth: Payer: Self-pay | Admitting: Internal Medicine

## 2018-03-01 NOTE — Telephone Encounter (Signed)
Handicap Placard Form to be filled out.  Placed in Dr's folder.  Upon Completion mail to patient using Self-addressed stamped envelope attached with form.

## 2018-03-02 NOTE — Telephone Encounter (Signed)
Noted  

## 2018-04-02 ENCOUNTER — Ambulatory Visit (INDEPENDENT_AMBULATORY_CARE_PROVIDER_SITE_OTHER): Payer: Medicare Other | Admitting: Podiatry

## 2018-04-02 ENCOUNTER — Other Ambulatory Visit: Payer: Self-pay | Admitting: Podiatry

## 2018-04-02 ENCOUNTER — Ambulatory Visit (INDEPENDENT_AMBULATORY_CARE_PROVIDER_SITE_OTHER): Payer: Medicare Other

## 2018-04-02 DIAGNOSIS — S90112A Contusion of left great toe without damage to nail, initial encounter: Secondary | ICD-10-CM

## 2018-04-02 DIAGNOSIS — L84 Corns and callosities: Secondary | ICD-10-CM

## 2018-04-02 DIAGNOSIS — D2121 Benign neoplasm of connective and other soft tissue of right lower limb, including hip: Secondary | ICD-10-CM | POA: Diagnosis not present

## 2018-04-02 DIAGNOSIS — M2041 Other hammer toe(s) (acquired), right foot: Secondary | ICD-10-CM | POA: Diagnosis not present

## 2018-04-02 DIAGNOSIS — Z9889 Other specified postprocedural states: Secondary | ICD-10-CM

## 2018-04-02 DIAGNOSIS — M722 Plantar fascial fibromatosis: Secondary | ICD-10-CM

## 2018-04-02 DIAGNOSIS — M79672 Pain in left foot: Secondary | ICD-10-CM | POA: Diagnosis not present

## 2018-04-02 DIAGNOSIS — M7752 Other enthesopathy of left foot: Secondary | ICD-10-CM

## 2018-04-02 DIAGNOSIS — M779 Enthesopathy, unspecified: Secondary | ICD-10-CM

## 2018-04-02 MED ORDER — CICLOPIROX 8 % EX SOLN: Freq: Every day | CUTANEOUS | 0 refills | Status: DC

## 2018-04-02 NOTE — Progress Notes (Signed)
Subjective:  Patient ID: Janice Small, female    DOB: 1927-10-16,  MRN: [id number]  Chief Complaint  Patient presents with  . Toe Pain    left great toe pain - fell up 2 stairs and toe has been sore since (about a week). History of bunion surgery on that foot years ago with our practice    84 y.o. female presents with the above complaint.  Complains of pain to the left great toe had a history of bunion surgery many years ago.  Also reports painful area to the bottom of the right foot.  Review of Systems: Negative except as noted in the HPI. Denies N/V/F/Ch.  Past Medical History:  Diagnosis Date  . BREAST BIOPSY, HX OF 11/27/2006  . COLONIC POLYPS, HX OF 11/27/2006  . HEMORRHOIDS 01/17/2010  . HYPERTENSION 11/27/2006  . HYPOTHYROIDISM 11/27/2006  . MENOPAUSAL SYNDROME 11/27/2006    Current Outpatient Medications:  .  amLODipine (NORVASC) 10 MG tablet, Take 1 tablet (10 mg total) by mouth daily., Disp: 90 tablet, Rfl: 3 .  ciclopirox (PENLAC) 8 % solution, Apply topically at bedtime. Apply over nail and surrounding skin. Apply daily over previous coat. Remove weekly with file or polish remover., Disp: 6.6 mL, Rfl: 0 .  levothyroxine (SYNTHROID, LEVOTHROID) 75 MCG tablet, Take 1 tablet (75 mcg total) by mouth daily., Disp: 90 tablet, Rfl: 4 .  lisinopril (PRINIVIL,ZESTRIL) 10 MG tablet, Take 1 tablet (10 mg total) by mouth daily., Disp: 90 tablet, Rfl: 4 .  metroNIDAZOLE (METROGEL) 0.75 % gel, Apply 1 application topically at bedtime. , Disp: , Rfl:  .  Multiple Vitamin (MULTIVITAMIN) tablet, Take 1 tablet by mouth daily.  , Disp: , Rfl:   Social History   Tobacco Use  Smoking Status Never Smoker  Smokeless Tobacco Never Used    Allergies  Allergen Reactions  . Codeine Phosphate     REACTION: unspecified   Objective:  There were no vitals filed for this visit. There is no height or weight on file to calculate BMI. Constitutional Well developed. Well nourished.  Vascular  Dorsalis pedis pulses palpable bilaterally. Posterior tibial pulses palpable bilaterally. Capillary refill normal to all digits.  No cyanosis or clubbing noted. Pedal hair growth normal.  Neurologic Normal speech. Oriented to person, place, and time. Epicritic sensation to light touch grossly present bilaterally.  Dermatologic Nails well groomed and normal in appearance. No open wounds. No skin lesions.  Orthopedic: Normal joint ROM without pain or crepitus bilaterally. No visible deformities. Pain palpation about the left hallux IPJ Pain palpation about the right medial arch with palpable nodule   Radiographs: Taken reviewed.  No acute fracture dislocations.  No fracture of the left great toe. Assessment:   1. Capsulitis of toe, left   2. Callus of foot   3. Plantar fasciitis   4. Fibroma of right foot   5. Acute pain of left foot   6. Contusion of left great toe without damage to nail, initial encounter    Plan:  Patient was evaluated and treated and all questions answered.  Contusion left great toe -X-rays reviewed no evidence of fracture -We will continue to monitor.  Fibroma right foot -Injection delivered as below  Procedure: Injection Tendon/Ligament Consent: Verbal consent obtained. Location: Right plantar fibroma. Skin Prep: Alcohol. Injectate: 0.5 cc 0.5% marcaine plain, 0.5 cc dexamethasone phosphate Disposition: Patient tolerated procedure well. Injection site dressed with a band-aid.    Return in about 3 weeks (around 04/23/2018) for contusion left foot  fibroma right foot.

## 2018-04-23 ENCOUNTER — Ambulatory Visit: Payer: Medicare Other | Admitting: Podiatry

## 2018-06-04 ENCOUNTER — Ambulatory Visit: Payer: Medicare Other | Admitting: Internal Medicine

## 2018-06-04 ENCOUNTER — Encounter: Payer: Self-pay | Admitting: Internal Medicine

## 2018-06-04 DIAGNOSIS — E039 Hypothyroidism, unspecified: Secondary | ICD-10-CM | POA: Diagnosis not present

## 2018-06-04 DIAGNOSIS — I10 Essential (primary) hypertension: Secondary | ICD-10-CM | POA: Diagnosis not present

## 2018-06-04 DIAGNOSIS — E210 Primary hyperparathyroidism: Secondary | ICD-10-CM

## 2018-06-04 DIAGNOSIS — Z23 Encounter for immunization: Secondary | ICD-10-CM | POA: Diagnosis not present

## 2018-06-04 MED ORDER — LEVOTHYROXINE SODIUM 75 MCG PO TABS: 75.0000 ug | ORAL_TABLET | Freq: Every day | ORAL | 1 refills | Status: DC

## 2018-06-04 MED ORDER — AMLODIPINE BESYLATE 10 MG PO TABS: 10.0000 mg | ORAL_TABLET | Freq: Every day | ORAL | 1 refills | Status: DC

## 2018-06-04 MED ORDER — LISINOPRIL 10 MG PO TABS: 10.0000 mg | ORAL_TABLET | Freq: Every day | ORAL | 1 refills | Status: DC

## 2018-06-04 NOTE — Patient Instructions (Signed)
-  It was nice meeting you! -We will do labs today, we will notify you of results when available. -Schedule follow-up with me in 6 months for your annual physical, please come fasting at that time.

## 2018-06-04 NOTE — Progress Notes (Signed)
Established Patient Office Visit     CC/Reason for Visit: Establish care, medication refills, follow-up chronic medical conditions  HPI: Janice Small is a 84 y.o. female who is coming in today for the above mentioned reasons.  Due for preventive health exam in April 2020.  Past Medical History is significant for: Hypertension, hypothyroidism.  She has no acute issues today, and on review of her chart she has had hypercalcemia, thought due to calcium supplementation was instructed to quit them at last visit.  She is on Synthroid and her TSH has been stable for years, hypertension has also been stable.  Past Medical/Surgical History: Past Medical History:  Diagnosis Date  . BREAST BIOPSY, HX OF 11/27/2006  . COLONIC POLYPS, HX OF 11/27/2006  . HEMORRHOIDS 01/17/2010  . HYPERTENSION 11/27/2006  . HYPOTHYROIDISM 11/27/2006  . MENOPAUSAL SYNDROME 11/27/2006    Past Surgical History:  Procedure Laterality Date  . ABDOMINAL HYSTERECTOMY    . APPENDECTOMY    . BREAST EXCISIONAL BIOPSY Right 1987  . BREAST SURGERY     bx  . TONSILLECTOMY      Social History:  reports that she has never smoked. She has never used smokeless tobacco. She reports that she drinks alcohol. She reports that she does not use drugs.  Allergies: Allergies  Allergen Reactions  . Codeine Phosphate     REACTION: unspecified    Family History:  Family History  Problem Relation Age of Onset  . Heart disease Father      Current Outpatient Medications:  .  amLODipine (NORVASC) 10 MG tablet, Take 1 tablet (10 mg total) by mouth daily., Disp: 90 tablet, Rfl: 1 .  ciclopirox (PENLAC) 8 % solution, Apply topically at bedtime. Apply over nail and surrounding skin. Apply daily over previous coat. Remove weekly with file or polish remover., Disp: 6.6 mL, Rfl: 0 .  levothyroxine (SYNTHROID, LEVOTHROID) 75 MCG tablet, Take 1 tablet (75 mcg total) by mouth daily., Disp: 90 tablet, Rfl: 1 .  lisinopril  (PRINIVIL,ZESTRIL) 10 MG tablet, Take 1 tablet (10 mg total) by mouth daily., Disp: 90 tablet, Rfl: 1 .  metroNIDAZOLE (METROGEL) 0.75 % gel, Apply 1 application topically at bedtime. , Disp: , Rfl:  .  Multiple Vitamin (MULTIVITAMIN) tablet, Take 1 tablet by mouth daily.  , Disp: , Rfl:   Review of Systems:  Constitutional: Denies fever, chills, diaphoresis, appetite change and fatigue.  HEENT: Denies photophobia, eye pain, redness, hearing loss, ear pain, congestion, sore throat, rhinorrhea, sneezing, mouth sores, trouble swallowing, neck pain, neck stiffness and tinnitus.   Respiratory: Denies SOB, DOE, cough, chest tightness,  and wheezing.   Cardiovascular: Denies chest pain, palpitations and leg swelling.  Gastrointestinal: Denies nausea, vomiting, abdominal pain, diarrhea, constipation, blood in stool and abdominal distention.  Genitourinary: Denies dysuria, urgency, frequency, hematuria, flank pain and difficulty urinating.  Endocrine: Denies: hot or cold intolerance, sweats, changes in hair or nails, polyuria, polydipsia. Musculoskeletal: Denies myalgias, back pain, joint swelling, arthralgias and gait problem.  Skin: Denies pallor, rash and wound.  Neurological: Denies dizziness, seizures, syncope, weakness, light-headedness, numbness and headaches.  Hematological: Denies adenopathy. Easy bruising, personal or family bleeding history  Psychiatric/Behavioral: Denies suicidal ideation, mood changes, confusion, nervousness, sleep disturbance and agitation    Physical Exam: Vitals:   06/04/18 1549  BP: 140/70  Pulse: 73  Temp: 98.2 F (36.8 C)  TempSrc: Oral  SpO2: 96%  Weight: 150 lb 14.4 oz (68.4 kg)    Body mass index  is 28.51 kg/m.   Constitutional: NAD, calm, comfortable Eyes: PERRL, lids and conjunctivae normal ENMT: Mucous membranes are moist. Posterior pharynx clear of any exudate or lesions. Normal dentition. Tympanic membrane is pearly white, no erythema or  bulging. Neck: normal, supple, no masses, no thyromegaly Respiratory: clear to auscultation bilaterally, no wheezing, no crackles. Normal respiratory effort. No accessory muscle use.  Cardiovascular: Regular rate and rhythm, no murmurs / rubs / gallops. No extremity edema. 2+ pedal pulses. No carotid bruits.  Abdomen: no tenderness, no masses palpated. No hepatosplenomegaly. Bowel sounds positive.  Musculoskeletal: no clubbing / cyanosis. No joint deformity upper and lower extremities. Good ROM, no contractures. Normal muscle tone.  Skin: no rashes, lesions, ulcers. No induration Neurologic: CN 2-12 grossly intact. Sensation intact, DTR normal. Strength 5/5 in all 4.  Psychiatric: Normal judgment and insight. Alert and oriented x 3. Normal mood.    Impression and Plan:  Hypercalcemia - Plan: Comprehensive metabolic panel, CANCELED: Comprehensive metabolic panel  Acquired hypothyroidism - Plan: levothyroxine (SYNTHROID, LEVOTHROID) 75 MCG tablet  Essential hypertension - Plan: amLODipine (NORVASC) 10 MG tablet, lisinopril (PRINIVIL,ZESTRIL) 10 MG tablet, Comprehensive metabolic panel  Needs flu shot - Plan: Flu vaccine HIGH DOSE PF (Fluzone High dose)  -Blood pressure is stable on current regimen, will continue. -We will continue Synthroid for hypothyroidism. -For hypercalcemia I will reorder a CMP today.  She has now been off calcium supplements for a few months.   Patient Instructions  -It was nice meeting you! -We will do labs today, we will notify you of results when available. -Schedule follow-up with me in 6 months for your annual physical, please come fasting at that time.     [name], MD [facility]

## 2018-06-05 LAB — COMPREHENSIVE METABOLIC PANEL
AG Ratio: 2.1 (calc) (ref 1.0–2.5)
ALBUMIN MSPROF: 4.5 g/dL (ref 3.6–5.1)
ALT: 11 U/L (ref 6–29)
AST: 9 U/L — ABNORMAL LOW (ref 10–35)
Alkaline phosphatase (APISO): 71 U/L (ref 33–130)
BUN / CREAT RATIO: 36 (calc) — AB (ref 6–22)
BUN: 30 mg/dL — AB (ref 7–25)
CHLORIDE: 103 mmol/L (ref 98–110)
CO2: 25 mmol/L (ref 20–32)
CREATININE: 0.83 mg/dL (ref 0.60–0.88)
Calcium: 10.7 mg/dL — ABNORMAL HIGH (ref 8.6–10.4)
GLOBULIN: 2.1 g/dL (ref 1.9–3.7)
GLUCOSE: 91 mg/dL (ref 65–99)
POTASSIUM: 4 mmol/L (ref 3.5–5.3)
Sodium: 139 mmol/L (ref 135–146)
Total Bilirubin: 0.4 mg/dL (ref 0.2–1.2)
Total Protein: 6.6 g/dL (ref 6.1–8.1)

## 2018-06-08 ENCOUNTER — Other Ambulatory Visit: Payer: Self-pay | Admitting: Internal Medicine

## 2018-06-14 ENCOUNTER — Other Ambulatory Visit (INDEPENDENT_AMBULATORY_CARE_PROVIDER_SITE_OTHER): Payer: Medicare Other

## 2018-06-15 LAB — PTH, INTACT AND CALCIUM
Calcium: 10.6 mg/dL — ABNORMAL HIGH (ref 8.6–10.4)
PTH: 92 pg/mL — ABNORMAL HIGH (ref 14–64)

## 2018-06-16 ENCOUNTER — Other Ambulatory Visit: Payer: Self-pay | Admitting: Internal Medicine

## 2018-06-16 DIAGNOSIS — E210 Primary hyperparathyroidism: Secondary | ICD-10-CM

## 2018-06-29 ENCOUNTER — Ambulatory Visit (INDEPENDENT_AMBULATORY_CARE_PROVIDER_SITE_OTHER)
Admission: RE | Admit: 2018-06-29 | Discharge: 2018-06-29 | Disposition: A | Discharge status: Home / Self Care | Payer: Medicare Other | Source: Ambulatory Visit | Attending: Internal Medicine | Admitting: Internal Medicine

## 2018-06-29 ENCOUNTER — Other Ambulatory Visit: Payer: Medicare Other

## 2018-06-29 DIAGNOSIS — E210 Primary hyperparathyroidism: Secondary | ICD-10-CM

## 2018-06-30 ENCOUNTER — Encounter: Payer: Self-pay | Admitting: Internal Medicine

## 2018-06-30 DIAGNOSIS — E210 Primary hyperparathyroidism: Secondary | ICD-10-CM | POA: Insufficient documentation

## 2018-06-30 DIAGNOSIS — M810 Age-related osteoporosis without current pathological fracture: Secondary | ICD-10-CM | POA: Insufficient documentation

## 2018-06-30 NOTE — Addendum Note (Signed)
Addended by: [name] Y on: 06/30/2018 02:58 PM   Modules accepted: Orders

## 2018-06-30 NOTE — Assessment & Plan Note (Signed)
Referred to surgery on 06/30/18 due to osteoporosis on DEXA for consideration of parathyroidectomy.

## 2018-07-01 ENCOUNTER — Other Ambulatory Visit: Payer: Self-pay | Admitting: Internal Medicine

## 2018-07-01 DIAGNOSIS — E039 Hypothyroidism, unspecified: Secondary | ICD-10-CM

## 2018-07-01 DIAGNOSIS — E213 Hyperparathyroidism, unspecified: Secondary | ICD-10-CM

## 2018-07-01 DIAGNOSIS — E034 Atrophy of thyroid (acquired): Secondary | ICD-10-CM

## 2018-08-12 ENCOUNTER — Other Ambulatory Visit (HOSPITAL_COMMUNITY): Payer: Self-pay | Admitting: Surgery

## 2018-08-12 DIAGNOSIS — E210 Primary hyperparathyroidism: Secondary | ICD-10-CM

## 2018-08-20 ENCOUNTER — Encounter (HOSPITAL_COMMUNITY)
Admission: RE | Admit: 2018-08-20 | Discharge: 2018-08-20 | Disposition: A | Discharge status: Home / Self Care | Payer: Medicare Other | Source: Ambulatory Visit | Attending: Surgery | Admitting: Surgery

## 2018-08-20 ENCOUNTER — Ambulatory Visit (HOSPITAL_COMMUNITY)
Admission: RE | Admit: 2018-08-20 | Discharge: 2018-08-20 | Disposition: A | Discharge status: Home / Self Care | Payer: Medicare Other | Source: Ambulatory Visit | Attending: Surgery | Admitting: Surgery

## 2018-08-20 DIAGNOSIS — E210 Primary hyperparathyroidism: Secondary | ICD-10-CM | POA: Diagnosis present

## 2018-08-20 MED ORDER — TECHNETIUM TC 99M SESTAMIBI - CARDIOLITE
25.0000 | Freq: Once | INTRAVENOUS | Status: AC | PRN
  Administered 2018-08-20: 09:00:00 25 via INTRAVENOUS

## 2018-08-26 ENCOUNTER — Telehealth: Payer: Self-pay | Admitting: Surgery

## 2018-08-26 NOTE — Telephone Encounter (Signed)
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 We again discussed that the standard recommendation is to proceed with excision of the abnormal gland. This is not without risk and we discussed the risks etc at her consultation in the office.  Given her age, minimal elevation of calcium, and lack of symptoms, it would also be reasonable to consider treatment with bisphosphonates for her osteoporosis (she may benefit from this regardless of surgery). She is understandably hesitant to consider surgery, and requests some time to think about it.

## 2018-09-06 ENCOUNTER — Telehealth: Payer: Self-pay

## 2018-09-06 DIAGNOSIS — E034 Atrophy of thyroid (acquired): Secondary | ICD-10-CM

## 2018-09-06 DIAGNOSIS — E210 Primary hyperparathyroidism: Secondary | ICD-10-CM

## 2018-09-06 DIAGNOSIS — E213 Hyperparathyroidism, unspecified: Secondary | ICD-10-CM

## 2018-09-06 NOTE — Telephone Encounter (Signed)
Copied from [place] [id number]. Topic: Referral - Status >> Sep 06, 2018  3:46 PM Yvette Rack wrote: Reason for CRM: Pt stated she does not want to go to a specialist (Endo) within the [facility]. Pt stated she needs to speak with Dr. Jerilee Hoh about this and a personal issue. Cb# [phone]

## 2018-09-07 NOTE — Telephone Encounter (Signed)
Spoke with patient and referral placed with [facility].

## 2018-09-23 ENCOUNTER — Other Ambulatory Visit: Payer: Self-pay

## 2018-09-23 ENCOUNTER — Encounter: Payer: Self-pay | Admitting: Internal Medicine

## 2018-09-23 ENCOUNTER — Ambulatory Visit (INDEPENDENT_AMBULATORY_CARE_PROVIDER_SITE_OTHER): Payer: Medicare Other | Admitting: Internal Medicine

## 2018-09-23 VITALS — BP 160/70 | HR 85 | Ht 60.0 in | Wt 150.0 lb

## 2018-09-23 DIAGNOSIS — E210 Primary hyperparathyroidism: Secondary | ICD-10-CM | POA: Diagnosis not present

## 2018-09-23 DIAGNOSIS — E039 Hypothyroidism, unspecified: Secondary | ICD-10-CM | POA: Diagnosis not present

## 2018-09-23 DIAGNOSIS — M818 Other osteoporosis without current pathological fracture: Secondary | ICD-10-CM | POA: Diagnosis not present

## 2018-09-23 LAB — TSH: TSH: 2.3 u[IU]/mL (ref 0.35–4.50)

## 2018-09-23 LAB — BASIC METABOLIC PANEL WITH GFR
BUN/Creatinine Ratio: 31 (calc) — ABNORMAL HIGH (ref 6–22)
BUN: 28 mg/dL — ABNORMAL HIGH (ref 7–25)
CO2: 25 mmol/L (ref 20–32)
Calcium: 11.6 mg/dL — ABNORMAL HIGH (ref 8.6–10.4)
Chloride: 105 mmol/L (ref 98–110)
Creat: 0.9 mg/dL — ABNORMAL HIGH (ref 0.60–0.88)
GFR, EST NON AFRICAN AMERICAN: 57 mL/min/{1.73_m2} — AB (ref >=60)
GFR, Est African American: 66 mL/min/{1.73_m2} (ref >=60)
GLUCOSE: 97 mg/dL (ref 65–99)
Potassium: 4.4 mmol/L (ref 3.5–5.3)
Sodium: 141 mmol/L (ref 135–146)

## 2018-09-23 LAB — VITAMIN D 25 HYDROXY (VIT D DEFICIENCY, FRACTURES): VITD: 23.46 ng/mL — ABNORMAL LOW (ref 30.00–100.00)

## 2018-09-23 LAB — T4, FREE: Free T4: 0.94 ng/dL (ref 0.60–1.60)

## 2018-09-23 NOTE — Patient Instructions (Addendum)
Please stop at the lab.  Please continue Levothyroxine 75 mcg daily.  Take the thyroid hormone every day, with water, at least 30 minutes before breakfast, separated by at least 4 hours from: - acid reflux medications - calcium - iron - multivitamins  We will start the forms to get you Prolia.  Please come back for a follow-up appointment in 6 months.  How Can I Prevent Falls? Men and women with osteoporosis need to take care not to fall down. Falls can break bones. Some reasons people fall are: Poor vision  Poor balance  Certain diseases that affect how you walk  Some types of medicine, such as sleeping pills.  Some tips to help prevent falls outdoors are: Use a cane or walker  Wear rubber-soled shoes so you don't slip  Walk on grass when sidewalks are slippery  In winter, put salt or kitty litter on icy sidewalks.  Some ways to help prevent falls indoors are: Keep rooms free of clutter, especially on floors  Use plastic or carpet runners on slippery floors  Wear low-heeled shoes that provide good support  Do not walk in socks, stockings, or slippers  Be sure carpets and area rugs have skid-proof backs or are tacked to the floor  Be sure stairs are well lit and have rails on both sides  Put grab bars on bathroom walls near tub, shower, and toilet  Use a rubber bath mat in the shower or tub  Keep a flashlight next to your bed  Use a sturdy step stool with a handrail and wide steps  Add more lights in rooms (and night lights) Buy a cordless phone to keep with you so that you don't have to rush to the phone       when it rings and so that you can call for help if you fall.   (adapted from [email or web address])

## 2018-09-23 NOTE — Progress Notes (Signed)
Patient ID: Zikeria Keough, female   DOB: 1927/10/23, 85 y.o.   MRN: [id number]    HPI  Liliana Dang is a 85 y.o.-year-old female, referred by her PCP, Dr. Jerilee Hoh, for evaluation and management of hypercalcemia/hyperparathyroidism and hypothyroidism.  Pt was has had hypercalcemia for many years, at least since 2008.  At the end of last year, she was also found to have a high PTH.   I reviewed pt's pertinent labs: Lab Results  Component Value Date   PTH 92 (H) 06/14/2018   Lab Results  Component Value Date   CALCIUM 10.6 (H) 06/14/2018   CALCIUM 10.7 (H) 06/04/2018   CALCIUM 10.6 (H) 10/12/2017   CALCIUM 10.4 10/08/2016   CALCIUM 11.0 (H) 10/01/2015   CALCIUM 10.8 (H) 09/29/2014   CALCIUM 10.7 (H) 06/17/2013   CALCIUM 11.3 (H) 06/16/2012   CALCIUM 10.4 06/16/2011   CALCIUM 11.0 (H) 06/12/2010   CALCIUM 11.0 (H) 06/04/2009   CALCIUM 10.7 (H) 05/31/2008   CALCIUM 10.9 (H) 04/14/2007   Pt was on MVI, also CaCO3 + vitamin D - not in last year.  She had the following imaging investigations so far: Thyroid ultrasound (08/20/2018): 0.7 x 0.5 x 0.6 nodule at the left inferior thyroid pole, possibly a parathyroid adenoma  Technetium sestamibi parathyroid scan (08/20/2018): Suspicious for left inferior parathyroid adenoma  She also has OP. I reviewed pt's most recent DXA scan report: 06/29/2018 Lumbar spine L1-L4 Femoral neck (FN) 33% distal radius  ultra distal radius  T-score  +1.1 RFN: -2.3 LFN: -1.9  -3.8  -4.5   + h/o ankle fracture several years.  She goes to [employer] - 3x a week - weight bearing exercises.  No h/o kidney stones.  No h/o CKD. Last BUN/Cr: Lab Results  Component Value Date   BUN 30 (H) 06/04/2018   BUN 25 (H) 10/12/2017   CREATININE 0.83 06/04/2018   CREATININE 0.74 10/12/2017   Pt is not on HCTZ.  No h/o vitamin D deficiency. No recent vit D levels: No results found for: VD25OH  Pt does not have a FH of hypercalcemia, pituitary tumors,  thyroid cancer, or osteoporosis.   Pt. also has a history of controlled hypothyroidism:  Pt is on levothyroxine 75 mcg daily, taken: - in am - fasting - at least 30 min from b'fast - no Ca, Fe, MVI, PPIs - not on Biotin  Lab Results  Component Value Date   TSH 2.15 10/12/2017   TSH 2.54 10/08/2016   TSH 1.93 10/01/2015   TSH 1.69 09/29/2014   TSH 2.46 06/17/2013   TSH 1.21 06/16/2012   TSH 2.09 06/16/2011   TSH 1.84 06/12/2010   TSH 1.82 06/04/2009   TSH 1.85 05/31/2008   TSH 2.11 04/14/2007   On B12, Magnesium 120 mg, Vit C.  She also has a history of HTN.  ROS: Constitutional: no weight gain/loss, no fatigue, no subjective hyperthermia/hypothermia, + nocturia Eyes: no blurry vision, no xerophthalmia ENT: no sore throat, no nodules palpated in throat, no dysphagia/odynophagia, no hoarseness Cardiovascular: no CP/SOB/palpitations/leg swelling Respiratory: no cough/SOB Gastrointestinal: no N/V/D/C Musculoskeletal: + Both: Muscle/joint aches Skin: no rashes Neurological: no tremors/numbness/tingling/dizziness Psychiatric: no depression/anxiety  Past Medical History:  Diagnosis Date  . BREAST BIOPSY, HX OF 11/27/2006  . COLONIC POLYPS, HX OF 11/27/2006  . HEMORRHOIDS 01/17/2010  . HYPERTENSION 11/27/2006  . HYPOTHYROIDISM 11/27/2006  . MENOPAUSAL SYNDROME 11/27/2006   Past Surgical History:  Procedure Laterality Date  . ABDOMINAL HYSTERECTOMY    . APPENDECTOMY    .  BREAST EXCISIONAL BIOPSY Right 1987  . BREAST SURGERY     bx  . TONSILLECTOMY     Social History   Socioeconomic History  . Marital status: Widowed -husband died 48    Spouse name: Not on file  . Number of children: 3  . Years of education: Not on file  . Highest education level: Not on file  Occupational History  .  Retired  Scientific laboratory technician  . Financial resource strain: Not on file  . Food insecurity:    Worry: Not on file    Inability: Not on file  . Transportation needs:    Medical: Not on  file    Non-medical: Not on file  Tobacco Use  . Smoking status: Never Smoker  . Smokeless tobacco: Never Used  Substance and Sexual Activity  . Alcohol use:  No      . Drug use: No   Current Outpatient Medications on File Prior to Visit  Medication Sig Dispense Refill  . amLODipine (NORVASC) 10 MG tablet Take 1 tablet (10 mg total) by mouth daily. 90 tablet 1  . ciclopirox (PENLAC) 8 % solution Apply topically at bedtime. Apply over nail and surrounding skin. Apply daily over previous coat. Remove weekly with file or polish remover. 6.6 mL 0  . levothyroxine (SYNTHROID, LEVOTHROID) 75 MCG tablet Take 1 tablet (75 mcg total) by mouth daily. 90 tablet 1  . lisinopril (PRINIVIL,ZESTRIL) 10 MG tablet Take 1 tablet (10 mg total) by mouth daily. 90 tablet 1  . metroNIDAZOLE (METROGEL) 0.75 % gel Apply 1 application topically at bedtime.     . Multiple Vitamin (MULTIVITAMIN) tablet Take 1 tablet by mouth daily.       No current facility-administered medications on file prior to visit.    Allergies  Allergen Reactions  . Codeine Phosphate     REACTION: unspecified   Family History  Problem Relation Age of Onset  . Heart disease Father   Also, diabetes and HTN and son.  PE: BP (!) 160/70   Pulse 85   Ht 5' (1.524 m)   Wt 150 lb (68 kg)   SpO2 95%   BMI 29.29 kg/m  Wt Readings from Last 3 Encounters:  09/23/18 150 lb (68 kg)  06/04/18 150 lb 14.4 oz (68.4 kg)  01/27/18 148 lb 12.8 oz (67.5 kg)   Constitutional: normal weight, in NAD. No kyphosis. Eyes: PERRLA, EOMI, no exophthalmos ENT: moist mucous membranes, no thyromegaly, no cervical lymphadenopathy Cardiovascular: RRR, No MRG Respiratory: CTA B Gastrointestinal: abdomen soft, NT, ND, BS+ Musculoskeletal: no deformities, strength intact in all 4 Skin: moist, warm, no rashes Neurological: no tremor with outstretched hands, DTR normal in all 4  Assessment: 1. Hypercalcemia/hyperparathyroidism  2.  Hypothyroidism  Plan: Patient has had several instances of elevated calcium, with the highest level being at 11.3. A corresponding intact PTH level was also high, at 92, .  - It is unclear what the patient also has vitamin D deficiency. - She does have apparent complications from hypercalcemia: no h/o nephrolithiasis, but has osteoporosis, more pronounced at the level of the 33% distal radius and ultra distal radius and has a history of ankle fracture. No abdominal pain, depression, bone pain. - I discussed with the patient about the physiology of calcium and parathyroid hormone, and possible side effects from increased PTH, including kidney stones, osteoporosis, abdominal pain, etc.  - Reviewing her previous imaging test, she does appear to have a left inferior parathyroid adenoma - I  discussed with her that we need to check her vitamin D level and bring this to normal if it is low, so we can further investigate the parathyroid status. I explained that in the setting of a low vitamin D, the parathyroid hormone can be elevated, which is not a pathologic finding.  - We discussed possible consequences of hyperparathyroidism: ~1/3 pts will develop complications over 15 years (OP-she already has this, nephrolithiasis).   - her calcium level has been elevated for at least 12 years and he has not been following in upwards trend.  Also, she did not have nephrolithiasis.  She does have severe osteoporosis, but it is unclear whether this is exclusively caused by the hyperparathyroidism.  And the decision whether to send her to surgery or not we have to take into account her age and her preference.  Her preference would be to avoid surgery.  I think this is reasonable for now but we need to follow-up for calcium.  We discussed about other options.  Cinacalcet would be an expensive option for her but since we are planning to start osteoporosis treatment, this would be a second option.  I would like to check her test  again in 6 months after we start antiresorptives and we may need to reevaluate the plan at that time depending on the results. - I will see the patient back in 6 months  2. Hypothyroidism - latest thyroid labs reviewed with pt >> normal 10/2017 - she continues on LT4 75 mcg daily - pt feels good on this dose. - we discussed about taking the thyroid hormone every day, with water, >30 minutes before breakfast, separated by >4 hours from acid reflux medications, calcium, iron, multivitamins. Pt. is taking it correctly. - will check thyroid tests today: TSH and fT4 - If labs are abnormal, she will need to return for repeat TFTs in 1.5 months  3. OP -Likely postmenopausal/age-related + secondary to hyperparathyroidism -At this visit, we reviewed her bone density results from 2019.  They show significant osteoporosis at the level of the radius.  The 33% distal radius reflects the quality of her cortical bone (which is influenced by the PTH), while the ultra distal radius reflect the quality of her trabecular bone and can be used as a surrogate BMD marker for the spine.  The spine appears to have a higher bone density most likely due to building up of arthritis. - discussed fall precautions   - Recommended to continue weightbearing exercises 3 times a week - We discussed that she will definitely need osteoporosis treatment. We discussed about the different medication classes, benefits and side effects (including atypical fractures and ONJ - no dental workup in progress or planned).  - I explained that first options are sq denosumab (Prolia) sq every 6 months, most likely for 6-10 years or zoledronic acid (Reclast) iv 1x a year for 1-2 years.  We could also use p.o. bisphosphonates, but I believe that she needs more significant protection for her bones. I would use Teriparatide/Abaloparatide (which are daily sq medication) or Romosozumab (monthly) as a last resort.  - I explained that Prolia or Reclast  will decrease her calcium levels, also. - Will check the following labs today: Orders Placed This Encounter  Procedures  . VITAMIN D 25 Hydroxy (Vit-D Deficiency, Fractures)  . Parathyroid hormone, intact (no Ca)  . BASIC METABOLIC PANEL WITH GFR  . TSH  . T4, free  - if labs normal, will arrange for a Prolia inj -  will check a new DXA scan in 2 years after starting Prolia  - will see pt back in 6 months  Office Visit on 09/23/2018  Component Date Value Ref Range Status  . VITD 09/23/2018 23.46* 30.00 - 100.00 ng/mL Final  . PTH 09/23/2018 45  15 - 65 pg/mL Final  . Glucose, Bld 09/23/2018 97  65 - 99 mg/dL Final   Comment: .            Fasting reference interval .   . BUN 09/23/2018 28* 7 - 25 mg/dL Final  . Creat 09/23/2018 0.90* 0.60 - 0.88 mg/dL Final   Comment: For patients >54 years of age, the reference limit for Creatinine is approximately 13% higher for people identified as African-American. .   . GFR, Est Non African American 09/23/2018 57* > OR = 60 mL/min/1.39m2 Final  . GFR, Est African American 09/23/2018 66  > OR = 60 mL/min/1.59m2 Final  . BUN/Creatinine Ratio 09/23/2018 31* 6 - 22 (calc) Final  . Sodium 09/23/2018 141  135 - 146 mmol/L Final  . Potassium 09/23/2018 4.4  3.5 - 5.3 mmol/L Final  . Chloride 09/23/2018 105  98 - 110 mmol/L Final  . CO2 09/23/2018 25  20 - 32 mmol/L Final  . Calcium 09/23/2018 11.6* 8.6 - 10.4 mg/dL Final  . TSH 09/23/2018 2.30  0.35 - 4.50 uIU/mL Final  . Free T4 09/23/2018 0.94  0.60 - 1.60 ng/dL Final   Comment: Specimens from patients who are undergoing biotin therapy and /or ingesting biotin supplements may contain high levels of biotin.  The higher biotin concentration in these specimens interferes with this Free T4 assay.  Specimens that contain high levels  of biotin may cause false high results for this Free T4 assay.  Please interpret results in light of the total clinical presentation of the patient.     Vitamin D is  low.  Will need to start vitamin D supplement and repeat a vitamin D level along with calcium and PTH in 2 months.  Calcium is higher now.  If it remains this high, we probably will need a referral to surgery. We will need to hold off Prolia until we normalize the vitamin D.  Philemon Kingdom, MD PhD [facility] Endocrinology

## 2018-09-24 LAB — PARATHYROID HORMONE, INTACT (NO CA): PTH: 45 pg/mL (ref 15–65)

## 2018-09-27 ENCOUNTER — Telehealth: Payer: Self-pay

## 2018-09-27 NOTE — Telephone Encounter (Signed)
Notified patient of message, she has questions about the surgery, she thought it was not ok for her age? Patient also thought she was getting something to decrease the calcium? Also how much vitamin D should she take?

## 2018-09-27 NOTE — Telephone Encounter (Signed)
-----   Message from Philemon Kingdom, MD sent at 09/24/2018  1:27 PM EDT ----- Lenna Sciara, can you please call pt: Vitamin D is low.  Will need to start vitamin D supplement and repeat a vitamin D level along with calcium and PTH in 2 months.  Calcium is higher now.  If it remains this high, we probably will need a referral to surgery, but I would like to recheck this first in 2 months. We will need to hold off Prolia until we normalize the vitamin D.  Labs are in.

## 2018-09-28 NOTE — Telephone Encounter (Signed)
Notified patient of message from Dr. Gherghe, patient expressed understanding and agreement. No further questions.  

## 2018-09-28 NOTE — Telephone Encounter (Signed)
Janice Small, I would suggest that she started 2000 units vitamin D daily. As we discussed at last visit, we cannot start bone building medicines until her vitamin D level is normal.  These are the ones that can also reduce her calcium. We discussed that we should try to avoid surgery, however, her calcium now returned higher than before, and this may change the plan.  However, for now, let us continue with increasing vitamin D and rechecking it in about 2 months and see from there.

## 2018-09-28 NOTE — Telephone Encounter (Signed)
Left message for patient to return our call at [phone].  

## 2018-11-10 ENCOUNTER — Other Ambulatory Visit: Payer: Self-pay | Admitting: Podiatry

## 2018-12-02 ENCOUNTER — Other Ambulatory Visit: Payer: Self-pay | Admitting: Internal Medicine

## 2018-12-02 DIAGNOSIS — I10 Essential (primary) hypertension: Secondary | ICD-10-CM

## 2018-12-03 ENCOUNTER — Encounter: Payer: Self-pay | Admitting: Internal Medicine

## 2018-12-03 ENCOUNTER — Ambulatory Visit (INDEPENDENT_AMBULATORY_CARE_PROVIDER_SITE_OTHER): Payer: Medicare Other | Admitting: Internal Medicine

## 2018-12-03 ENCOUNTER — Other Ambulatory Visit: Payer: Self-pay

## 2018-12-03 VITALS — BP 130/70 | HR 75 | Temp 97.9°F | Ht 60.0 in | Wt 149.3 lb

## 2018-12-03 DIAGNOSIS — I499 Cardiac arrhythmia, unspecified: Secondary | ICD-10-CM | POA: Diagnosis not present

## 2018-12-03 DIAGNOSIS — M818 Other osteoporosis without current pathological fracture: Secondary | ICD-10-CM

## 2018-12-03 DIAGNOSIS — E210 Primary hyperparathyroidism: Secondary | ICD-10-CM | POA: Diagnosis not present

## 2018-12-03 DIAGNOSIS — I10 Essential (primary) hypertension: Secondary | ICD-10-CM

## 2018-12-03 DIAGNOSIS — E559 Vitamin D deficiency, unspecified: Secondary | ICD-10-CM

## 2018-12-03 LAB — CBC WITH DIFFERENTIAL/PLATELET
Basophils Absolute: 0 10*3/uL (ref 0.0–0.1)
Basophils Relative: 0.7 % (ref 0.0–3.0)
Eosinophils Absolute: 0.1 10*3/uL (ref 0.0–0.7)
Eosinophils Relative: 1.2 % (ref 0.0–5.0)
HCT: 41.6 % (ref 36.0–46.0)
Hemoglobin: 14.2 g/dL (ref 12.0–15.0)
Lymphocytes Relative: 18.7 % (ref 12.0–46.0)
Lymphs Abs: 0.9 10*3/uL (ref 0.7–4.0)
MCHC: 34.2 g/dL (ref 30.0–36.0)
MCV: 97.1 fl (ref 78.0–100.0)
Monocytes Absolute: 0.4 10*3/uL (ref 0.1–1.0)
Monocytes Relative: 8.8 % (ref 3.0–12.0)
Neutro Abs: 3.5 10*3/uL (ref 1.4–7.7)
Neutrophils Relative %: 70.6 % (ref 43.0–77.0)
Platelets: 141 10*3/uL — ABNORMAL LOW (ref 150.0–400.0)
RBC: 4.29 Mil/uL (ref 3.87–5.11)
RDW: 13.2 % (ref 11.5–15.5)
WBC: 4.9 10*3/uL (ref 4.0–10.5)

## 2018-12-03 LAB — COMPREHENSIVE METABOLIC PANEL
ALT: 13 U/L (ref 0–35)
AST: 9 U/L (ref 0–37)
Albumin: 4.7 g/dL (ref 3.5–5.2)
Alkaline Phosphatase: 68 U/L (ref 39–117)
BUN: 25 mg/dL — ABNORMAL HIGH (ref 6–23)
CO2: 28 mEq/L (ref 19–32)
Calcium: 11 mg/dL — ABNORMAL HIGH (ref 8.4–10.5)
Chloride: 105 mEq/L (ref 96–112)
Creatinine, Ser: 0.89 mg/dL (ref 0.40–1.20)
GFR: 59.71 mL/min — ABNORMAL LOW (ref >60.00)
Glucose, Bld: 112 mg/dL — ABNORMAL HIGH (ref 70–99)
Potassium: 4.1 mEq/L (ref 3.5–5.1)
Sodium: 143 mEq/L (ref 135–145)
Total Bilirubin: 0.8 mg/dL (ref 0.2–1.2)
Total Protein: 6.9 g/dL (ref 6.0–8.3)

## 2018-12-03 LAB — TSH: TSH: 1.47 u[IU]/mL (ref 0.35–4.50)

## 2018-12-03 LAB — LIPID PANEL
Cholesterol: 202 mg/dL — ABNORMAL HIGH (ref 0–200)
HDL: 97.6 mg/dL (ref >39.00)
LDL Cholesterol: 86 mg/dL (ref 0–99)
NonHDL: 104.86
Total CHOL/HDL Ratio: 2
Triglycerides: 93 mg/dL (ref 0.0–149.0)
VLDL: 18.6 mg/dL (ref 0.0–40.0)

## 2018-12-03 LAB — VITAMIN D 25 HYDROXY (VIT D DEFICIENCY, FRACTURES): VITD: 32.98 ng/mL (ref 30.00–100.00)

## 2018-12-03 NOTE — Patient Instructions (Addendum)
-  Nice seeing you today!  -Lab work today; will notify you when results are available. Will also forward results to Dr. Cruzita Lederer for review.  -Please reschedule for appointment for your annual physical and wellness visit as we discussed other issues today.

## 2018-12-03 NOTE — Progress Notes (Signed)
Established Patient Office Visit     CC/Reason for Visit: Scheduled for CPE/AWV  HPI: Janice Small is a 85 y.o. female who is coming in today for the above mentioned reasons. Past Medical History is significant for: HTN and hypothyroidism that have been well controlled. She has also been diagnosed with vit D def and possibly primary hyperparathyroidism and was referred to endocrine. She has significant osteoporosis as well. She has no acute complaints. Due to amount of time spent discussing hypercalcemia/hyperparathyroidism and Vit D deficiency, we have decided to reschedule well visit to a different date.   Past Medical/Surgical History: Past Medical History:  Diagnosis Date  . BREAST BIOPSY, HX OF 11/27/2006  . COLONIC POLYPS, HX OF 11/27/2006  . HEMORRHOIDS 01/17/2010  . HYPERTENSION 11/27/2006  . HYPOTHYROIDISM 11/27/2006  . MENOPAUSAL SYNDROME 11/27/2006    Past Surgical History:  Procedure Laterality Date  . ABDOMINAL HYSTERECTOMY    . APPENDECTOMY    . BREAST EXCISIONAL BIOPSY Right 1987  . BREAST SURGERY     bx  . TONSILLECTOMY      Social History:  reports that she has never smoked. She has never used smokeless tobacco. She reports current alcohol use. She reports that she does not use drugs.  Allergies: Allergies  Allergen Reactions  . Codeine Phosphate     REACTION: unspecified    Family History:  Family History  Problem Relation Age of Onset  . Heart disease Father      Current Outpatient Medications:  .  amLODipine (NORVASC) 10 MG tablet, Take 1 tablet (10 mg total) by mouth daily., Disp: 90 tablet, Rfl: 1 .  levothyroxine (SYNTHROID, LEVOTHROID) 75 MCG tablet, Take 1 tablet (75 mcg total) by mouth daily., Disp: 90 tablet, Rfl: 1 .  lisinopril (ZESTRIL) 10 MG tablet, TAKE 1 TABLET DAILY, Disp: 90 tablet, Rfl: 1 .  metroNIDAZOLE (METROGEL) 0.75 % gel, Apply 1 application topically at bedtime. , Disp: , Rfl:  .  Multiple Vitamin (MULTIVITAMIN)  tablet, Take 1 tablet by mouth daily.  , Disp: , Rfl:   Review of Systems:  Constitutional: Denies fever, chills, diaphoresis, appetite change and fatigue.  HEENT: Denies photophobia, eye pain, redness, hearing loss, ear pain, congestion, sore throat, rhinorrhea, sneezing, mouth sores, trouble swallowing, neck pain, neck stiffness and tinnitus.   Respiratory: Denies SOB, DOE, cough, chest tightness,  and wheezing.   Cardiovascular: Denies chest pain, palpitations and leg swelling.  Gastrointestinal: Denies nausea, vomiting, abdominal pain, diarrhea, constipation, blood in stool and abdominal distention.  Genitourinary: Denies dysuria, urgency, frequency, hematuria, flank pain and difficulty urinating.  Endocrine: Denies: hot or cold intolerance, sweats, changes in hair or nails, polyuria, polydipsia. Musculoskeletal: Denies myalgias, back pain, joint swelling, arthralgias and gait problem.  Skin: Denies pallor, rash and wound.  Neurological: Denies dizziness, seizures, syncope, weakness, light-headedness, numbness and headaches.  Hematological: Denies adenopathy. Easy bruising, personal or family bleeding history  Psychiatric/Behavioral: Denies suicidal ideation, mood changes, confusion, nervousness, sleep disturbance and agitation    Physical Exam: Vitals:   12/03/18 0933  BP: 130/70  Pulse: 75  Temp: 97.9 F (36.6 C)  TempSrc: Oral  SpO2: 95%  Weight: 149 lb 4.8 oz (67.7 kg)  Height: 5' (1.524 m)    Body mass index is 29.16 kg/m.   Constitutional: NAD, calm, comfortable Eyes: PERRL, lids and conjunctivae normal ENMT: Mucous membranes are moist.  Respiratory: clear to auscultation bilaterally, no wheezing, no crackles. Normal respiratory effort. No accessory muscle use.  Cardiovascular:  irregular heart rhythm, no murmurs / rubs / gallops. No extremity edema. 2+ pedal pulses.  Abdomen: no tenderness, no masses palpated. No hepatosplenomegaly. Bowel sounds positive.   Musculoskeletal: no clubbing / cyanosis. No joint deformity upper and lower extremities. Good ROM, no contractures. Normal muscle tone.  Neurologic: CN 2-12 grossly intact. Sensation intact, DTR normal. Strength 5/5 in all 4.  Psychiatric: Normal judgment and insight. Alert and oriented x 3. Normal mood.    Impression and Plan:  Irregular heart rhythm  -New diagnosis this visit. -EKG has been done in office and I have personally interpreted the result: sinus rhythm with some PVCs, rate 85, normal axis, no ST-T changes. -No further follow up required  Essential hypertension  -Well controlled on norvasc and lisinopril.  Hypercalcemia Primary hyperparathyroidism (HCC)  Other osteoporosis without current pathological fracture Vitamin D deficiency - Plan: VITAMIN D 25 Hydroxy (Vit-D Deficiency, Fractures) -Will have repeat calcium, PTH, vit D drawn today per Dr. Arman Filter recommendation and forward to her. -With her osteoporosis, we probably should proceed with surgical referral, but agree that we should correct her vit d deficiency first.  Reschedule visit for CPE/AWV.    Patient Instructions  -Nice seeing you today!  -Lab work today; will notify you when results are available. Will also forward results to Janice Small for review.  -Please reschedule for appointment for your annual physical and wellness visit as we discussed other issues today.     Janice Frohlich, MD [facility]

## 2018-12-07 LAB — PTH, INTACT AND CALCIUM
Calcium: 11.3 mg/dL — ABNORMAL HIGH (ref 8.6–10.4)
PTH: 32 pg/mL (ref 14–64)

## 2018-12-10 ENCOUNTER — Telehealth: Payer: Self-pay | Admitting: Internal Medicine

## 2018-12-10 NOTE — Telephone Encounter (Signed)
Notes for this will be documented in lab results

## 2018-12-10 NOTE — Telephone Encounter (Signed)
-----   Message from Philemon Kingdom, MD sent at 12/07/2018 12:38 PM EDT ----- Hi Lisa/Stephanie, Can you please submit this pt to the portal for Prolia? Please let me know if approved or not approved. Ty, C

## 2018-12-10 NOTE — Telephone Encounter (Signed)
Janice Small, Please attach the patient's prolia status to this when you can so we can keep track

## 2018-12-11 ENCOUNTER — Other Ambulatory Visit: Payer: Self-pay | Admitting: Internal Medicine

## 2018-12-11 DIAGNOSIS — I10 Essential (primary) hypertension: Secondary | ICD-10-CM

## 2018-12-21 ENCOUNTER — Other Ambulatory Visit: Payer: Self-pay

## 2018-12-21 ENCOUNTER — Ambulatory Visit (INDEPENDENT_AMBULATORY_CARE_PROVIDER_SITE_OTHER): Payer: Medicare Other | Admitting: Internal Medicine

## 2018-12-21 ENCOUNTER — Encounter: Payer: Self-pay | Admitting: Internal Medicine

## 2018-12-21 VITALS — BP 130/84 | HR 60 | Temp 98.6°F | Ht 60.0 in | Wt 149.9 lb

## 2018-12-21 DIAGNOSIS — I499 Cardiac arrhythmia, unspecified: Secondary | ICD-10-CM

## 2018-12-21 DIAGNOSIS — M25471 Effusion, right ankle: Secondary | ICD-10-CM

## 2018-12-21 DIAGNOSIS — I10 Essential (primary) hypertension: Secondary | ICD-10-CM

## 2018-12-21 DIAGNOSIS — E039 Hypothyroidism, unspecified: Secondary | ICD-10-CM | POA: Diagnosis not present

## 2018-12-21 DIAGNOSIS — M25472 Effusion, left ankle: Secondary | ICD-10-CM

## 2018-12-21 DIAGNOSIS — Z0000 Encounter for general adult medical examination without abnormal findings: Secondary | ICD-10-CM

## 2018-12-21 DIAGNOSIS — M818 Other osteoporosis without current pathological fracture: Secondary | ICD-10-CM

## 2018-12-21 NOTE — Progress Notes (Signed)
Established Patient Office Visit     CC/Reason for Visit: Annual preventive exam and subsequent Medicare wellness visit  HPI: Janice Small is a 85 y.o. female who is coming in today for the above mentioned reasons. Past Medical History is significant for: Hypertension and hypothyroidism that have been well controlled, osteoporosis, vitamin D deficiency and hypercalcemia for which endocrinology is involved and she will start Prolia injections soon.  She has no acute complaints today.  She has routine eye and dental care, she is up-to-date on all immunizations with the exception of shingles which she agrees to obtain at the pharmacy.  She has exceeded the age for which recommended routine cancer screening is advised, she prefers to continue mammograms, her last was in April 2019.  She does not drink, she does not smoke.  She is not very physically active.   Past Medical/Surgical History: Past Medical History:  Diagnosis Date  . BREAST BIOPSY, HX OF 11/27/2006  . COLONIC POLYPS, HX OF 11/27/2006  . HEMORRHOIDS 01/17/2010  . HYPERTENSION 11/27/2006  . HYPOTHYROIDISM 11/27/2006  . MENOPAUSAL SYNDROME 11/27/2006    Past Surgical History:  Procedure Laterality Date  . ABDOMINAL HYSTERECTOMY    . APPENDECTOMY    . BREAST EXCISIONAL BIOPSY Right 1987  . BREAST SURGERY     bx  . TONSILLECTOMY      Social History:  reports that she has never smoked. She has never used smokeless tobacco. She reports current alcohol use. She reports that she does not use drugs.  Allergies: Allergies  Allergen Reactions  . Codeine Phosphate     REACTION: unspecified    Family History:  Family History  Problem Relation Age of Onset  . Heart disease Father      Current Outpatient Medications:  .  amLODipine (NORVASC) 10 MG tablet, TAKE 1 TABLET DAILY, Disp: 90 tablet, Rfl: 1 .  levothyroxine (SYNTHROID, LEVOTHROID) 75 MCG tablet, Take 1 tablet (75 mcg total) by mouth daily., Disp: 90 tablet,  Rfl: 1 .  lisinopril (ZESTRIL) 10 MG tablet, TAKE 1 TABLET DAILY, Disp: 90 tablet, Rfl: 1 .  metroNIDAZOLE (METROGEL) 0.75 % gel, Apply 1 application topically at bedtime. , Disp: , Rfl:  .  Multiple Vitamin (MULTIVITAMIN) tablet, Take 1 tablet by mouth daily.  , Disp: , Rfl:   Review of Systems:  Constitutional: Denies fever, chills, diaphoresis, appetite change and fatigue.  HEENT: Denies photophobia, eye pain, redness, hearing loss, ear pain, congestion, sore throat, rhinorrhea, sneezing, mouth sores, trouble swallowing, neck pain, neck stiffness and tinnitus.   Respiratory: Denies SOB, DOE, cough, chest tightness,  and wheezing.   Cardiovascular: Denies chest pain, palpitations and leg swelling.  Gastrointestinal: Denies nausea, vomiting, abdominal pain, diarrhea, constipation, blood in stool and abdominal distention.  Genitourinary: Denies dysuria, urgency, frequency, hematuria, flank pain and difficulty urinating.  Endocrine: Denies: hot or cold intolerance, sweats, changes in hair or nails, polyuria, polydipsia. Musculoskeletal: Denies myalgias, back pain, joint swelling, arthralgias and gait problem.  Skin: Denies pallor, rash and wound.  Neurological: Denies dizziness, seizures, syncope, weakness, light-headedness, numbness and headaches.  Hematological: Denies adenopathy. Easy bruising, personal or family bleeding history  Psychiatric/Behavioral: Denies suicidal ideation, mood changes, confusion, nervousness, sleep disturbance and agitation    Physical Exam: Vitals:   12/21/18 1051  BP: 130/84  Pulse: 60  Temp: 98.6 F (37 C)  TempSrc: Oral  SpO2: 97%  Weight: 149 lb 14.4 oz (68 kg)  Height: 5' (1.524 m)    Body  mass index is 29.28 kg/m.   Constitutional: NAD, calm, comfortable Eyes: PERRL, lids and conjunctivae normal, wears corrective lenses ENMT: Mucous membranes are moist. Posterior pharynx clear of any exudate or lesions. Normal dentition. Tympanic membrane is  pearly white, no erythema or bulging. Neck: normal, supple, no masses, no thyromegaly Respiratory: clear to auscultation bilaterally, no wheezing, no crackles. Normal respiratory effort. No accessory muscle use.  Cardiovascular: Regular rate and irregular rhythm, no murmurs / rubs / gallops.  1+ pitting edema bilaterally, right greater than left at the ankle. 2+ pedal pulses. No carotid bruits.  Abdomen: no tenderness, no masses palpated. No hepatosplenomegaly. Bowel sounds positive.  Musculoskeletal: no clubbing / cyanosis. No joint deformity upper and lower extremities. Good ROM, no contractures. Normal muscle tone.  Skin: no rashes, lesions, ulcers. No induration Neurologic: CN 2-12 grossly intact. Sensation intact, DTR normal. Strength 5/5 in all 4.  Psychiatric: Normal judgment and insight. Alert and oriented x 3. Normal mood.    Subsequent Medicare wellness visit   1. Risk factors, based on past  M,S,F -cardiovascular disease risk factors include history of hypertension, age   25.  Physical activities: She admits to being very sedentary   3.  Depression/mood:  Stable, not depressed   4.  Hearing:  No issues with hearing   5.  ADL's: She remains independent in all ADLs   6.  Fall risk:  Low fall risk   7.  Home safety: No problems identified   8.  Height weight, and visual acuity: Height and weight as above, visual acuity is 20/30 on the right, 20/30 on the left, 20/30 with both eyes   9.  Counseling:  Have advised some mild physical activity, she is to notify us if she has palpitations or dizziness   10. Lab orders based on risk factors: Laboratory update will be reviewed   11. Referral :  No referrals today   12. Care plan:  Follow-up with me on a biannual basis, with endocrinology as scheduled   13. Cognitive assessment:  She remains cognitively intact   14. Screening: Patient provided with a written and personalized 5-10 year screening schedule in the AVS.   yes, see  details in the assessment and plan section below   15. Provider List Update:   PCP, endocrinology Dr. Maryellen Pile  16. Advance Directives: She is a full code     Office Visit from 12/21/2018 in [place] at [facility]  PHQ-9 Total Score  0      Fall Risk  12/21/2018 12/03/2018 06/04/2018 10/12/2017 10/08/2016  Falls in the past year? 0 0 0 No No  Number falls in past yr: 0 0 0 - -  Injury with Fall? 0 0 0 - -  Risk for fall due to : - - - - -      Impression and Plan:  Encounter for preventive health examination -She has routine eye and dental care. -Screening labs performed in May and have been reviewed. -She has surpassed age for recommended routine cancer screening, she prefers to continue with mammograms which she will schedule. -Shingles vaccine at pharmacy, otherwise she is up-to-date on all age-appropriate immunizations.  Acquired hypothyroidism -TSH was 1.470 in May 2020, continue current Synthroid dose. -She tells me that Dr. Cruzita Lederer will take over her Synthroid prescription  Essential hypertension -Well-controlled, continue lisinopril and amlodipine.  Hypercalcemia -Followed by endocrinology, scheduled for Prolia.  Other osteoporosis without current pathological fracture -To receive Prolia through her endocrinologist office  Ankle  edema, bilateral -Although it is bilateral it is more prominent on the right where she has some chronic arthritis, she tells me that this edema is chronic. -If edema increases or becomes an issue, we can consider discontinuing Norvasc as it is known to cause lower extremity edema.  Irregular heart rhythm -This was assessed at her last visit in May with an EKG, she was found to have some PVCs. -No further work-up for now unless she were to become symptomatic. -She has been advised to notify us if she develops dizziness or palpitations.    Patient Instructions  -Nice seeing you today!  -Make sure you schedule your mammogram and  get your shingles vaccination at the pharmacy.  -Schedule follow up in 6 months.   Preventive Care 7 Years and Older, Female Preventive care refers to lifestyle choices and visits with your health care provider that can promote health and wellness. What does preventive care include?  A yearly physical exam. This is also called an annual well check.  Dental exams once or twice a year.  Routine eye exams. Ask your health care provider how often you should have your eyes checked.  Personal lifestyle choices, including: ? Daily care of your teeth and gums. ? Regular physical activity. ? Eating a healthy diet. ? Avoiding tobacco and drug use. ? Limiting alcohol use. ? Practicing safe sex. ? Taking low-dose aspirin every day. ? Taking vitamin and mineral supplements as recommended by your health care provider. What happens during an annual well check? The services and screenings done by your health care provider during your annual well check will depend on your age, overall health, lifestyle risk factors, and family history of disease. Counseling Your health care provider may ask you questions about your:  Alcohol use.  Tobacco use.  Drug use.  Emotional well-being.  Home and relationship well-being.  Sexual activity.  Eating habits.  History of falls.  Memory and ability to understand (cognition).  Work and work Statistician.  Reproductive health.  Screening You may have the following tests or measurements:  Height, weight, and BMI.  Blood pressure.  Lipid and cholesterol levels. These may be checked every 5 years, or more frequently if you are over 32 years old.  Skin check.  Lung cancer screening. You may have this screening every year starting at age 67 if you have a 30-pack-year history of smoking and currently smoke or have quit within the past 15 years.  Colorectal cancer screening. All adults should have this screening starting at age 27 and  continuing until age 26. You will have tests every 1-10 years, depending on your results and the type of screening test. People at increased risk should start screening at an earlier age. Screening tests may include: ? Guaiac-based fecal occult blood testing. ? Fecal immunochemical test (FIT). ? Stool DNA test. ? Virtual colonoscopy. ? Sigmoidoscopy. During this test, a flexible tube with a tiny camera (sigmoidoscope) is used to examine your rectum and lower colon. The sigmoidoscope is inserted through your anus into your rectum and lower colon. ? Colonoscopy. During this test, a long, thin, flexible tube with a tiny camera (colonoscope) is used to examine your entire colon and rectum.  Hepatitis C blood test.  Hepatitis B blood test.  Sexually transmitted disease (STD) testing.  Diabetes screening. This is done by checking your blood sugar (glucose) after you have not eaten for a while (fasting). You may have this done every 1-3 years.  Bone density scan. This  is done to screen for osteoporosis. You may have this done starting at age 12.  Mammogram. This may be done every 1-2 years. Talk to your health care provider about how often you should have regular mammograms. Talk with your health care provider about your test results, treatment options, and if necessary, the need for more tests. Vaccines Your health care provider may recommend certain vaccines, such as:  Influenza vaccine. This is recommended every year.  Tetanus, diphtheria, and acellular pertussis (Tdap, Td) vaccine. You may need a Td booster every 10 years.  Varicella vaccine. You may need this if you have not been vaccinated.  Zoster vaccine. You may need this after age 87.  Measles, mumps, and rubella (MMR) vaccine. You may need at least one dose of MMR if you were born in 1957 or later. You may also need a second dose.  Pneumococcal 13-valent conjugate (PCV13) vaccine. One dose is recommended after age 39.   Pneumococcal polysaccharide (PPSV23) vaccine. One dose is recommended after age 6.  Meningococcal vaccine. You may need this if you have certain conditions.  Hepatitis A vaccine. You may need this if you have certain conditions or if you travel or work in places where you may be exposed to hepatitis A.  Hepatitis B vaccine. You may need this if you have certain conditions or if you travel or work in places where you may be exposed to hepatitis B.  Haemophilus influenzae type b (Hib) vaccine. You may need this if you have certain conditions. Talk to your health care provider about which screenings and vaccines you need and how often you need them. This information is not intended to replace advice given to you by your health care provider. Make sure you discuss any questions you have with your health care provider. Document Released: 07/27/2015 Document Revised: 08/20/2017 Document Reviewed: 05/01/2015 Elsevier Interactive Patient Education  2019 [place], MD [facility]

## 2018-12-21 NOTE — Patient Instructions (Signed)
-Nice seeing you today!  -Make sure you schedule your mammogram and get your shingles vaccination at the pharmacy.  -Schedule follow up in 6 months.   Preventive Care 83 Years and Older, Female Preventive care refers to lifestyle choices and visits with your health care provider that can promote health and wellness. What does preventive care include?  A yearly physical exam. This is also called an annual well check.  Dental exams once or twice a year.  Routine eye exams. Ask your health care provider how often you should have your eyes checked.  Personal lifestyle choices, including: ? Daily care of your teeth and gums. ? Regular physical activity. ? Eating a healthy diet. ? Avoiding tobacco and drug use. ? Limiting alcohol use. ? Practicing safe sex. ? Taking low-dose aspirin every day. ? Taking vitamin and mineral supplements as recommended by your health care provider. What happens during an annual well check? The services and screenings done by your health care provider during your annual well check will depend on your age, overall health, lifestyle risk factors, and family history of disease. Counseling Your health care provider may ask you questions about your:  Alcohol use.  Tobacco use.  Drug use.  Emotional well-being.  Home and relationship well-being.  Sexual activity.  Eating habits.  History of falls.  Memory and ability to understand (cognition).  Work and work Statistician.  Reproductive health.  Screening You may have the following tests or measurements:  Height, weight, and BMI.  Blood pressure.  Lipid and cholesterol levels. These may be checked every 5 years, or more frequently if you are over 82 years old.  Skin check.  Lung cancer screening. You may have this screening every year starting at age 47 if you have a 30-pack-year history of smoking and currently smoke or have quit within the past 15 years.  Colorectal cancer screening.  All adults should have this screening starting at age 24 and continuing until age 40. You will have tests every 1-10 years, depending on your results and the type of screening test. People at increased risk should start screening at an earlier age. Screening tests may include: ? Guaiac-based fecal occult blood testing. ? Fecal immunochemical test (FIT). ? Stool DNA test. ? Virtual colonoscopy. ? Sigmoidoscopy. During this test, a flexible tube with a tiny camera (sigmoidoscope) is used to examine your rectum and lower colon. The sigmoidoscope is inserted through your anus into your rectum and lower colon. ? Colonoscopy. During this test, a long, thin, flexible tube with a tiny camera (colonoscope) is used to examine your entire colon and rectum.  Hepatitis C blood test.  Hepatitis B blood test.  Sexually transmitted disease (STD) testing.  Diabetes screening. This is done by checking your blood sugar (glucose) after you have not eaten for a while (fasting). You may have this done every 1-3 years.  Bone density scan. This is done to screen for osteoporosis. You may have this done starting at age 67.  Mammogram. This may be done every 1-2 years. Talk to your health care provider about how often you should have regular mammograms. Talk with your health care provider about your test results, treatment options, and if necessary, the need for more tests. Vaccines Your health care provider may recommend certain vaccines, such as:  Influenza vaccine. This is recommended every year.  Tetanus, diphtheria, and acellular pertussis (Tdap, Td) vaccine. You may need a Td booster every 10 years.  Varicella vaccine. You may need this  if you have not been vaccinated.  Zoster vaccine. You may need this after age 56.  Measles, mumps, and rubella (MMR) vaccine. You may need at least one dose of MMR if you were born in 1957 or later. You may also need a second dose.  Pneumococcal 13-valent conjugate  (PCV13) vaccine. One dose is recommended after age 29.  Pneumococcal polysaccharide (PPSV23) vaccine. One dose is recommended after age 57.  Meningococcal vaccine. You may need this if you have certain conditions.  Hepatitis A vaccine. You may need this if you have certain conditions or if you travel or work in places where you may be exposed to hepatitis A.  Hepatitis B vaccine. You may need this if you have certain conditions or if you travel or work in places where you may be exposed to hepatitis B.  Haemophilus influenzae type b (Hib) vaccine. You may need this if you have certain conditions. Talk to your health care provider about which screenings and vaccines you need and how often you need them. This information is not intended to replace advice given to you by your health care provider. Make sure you discuss any questions you have with your health care provider. Document Released: 07/27/2015 Document Revised: 08/20/2017 Document Reviewed: 05/01/2015 Elsevier Interactive Patient Education  2019 [employer].

## 2018-12-24 ENCOUNTER — Telehealth: Payer: Self-pay | Admitting: Internal Medicine

## 2018-12-24 ENCOUNTER — Other Ambulatory Visit: Payer: Self-pay

## 2018-12-24 ENCOUNTER — Ambulatory Visit: Payer: Medicare Other

## 2018-12-24 DIAGNOSIS — E039 Hypothyroidism, unspecified: Secondary | ICD-10-CM

## 2018-12-24 DIAGNOSIS — M818 Other osteoporosis without current pathological fracture: Secondary | ICD-10-CM

## 2018-12-24 MED ORDER — LEVOTHYROXINE SODIUM 75 MCG PO TABS: 75.0000 ug | ORAL_TABLET | Freq: Every day | ORAL | 1 refills | Status: DC

## 2018-12-24 MED ORDER — DENOSUMAB 60 MG/ML ~~LOC~~ SOSY
60.0000 mg | PREFILLED_SYRINGE | Freq: Once | SUBCUTANEOUS | Status: AC
  Administered 2018-12-24: 60 mg via SUBCUTANEOUS

## 2018-12-24 NOTE — Telephone Encounter (Signed)
MEDICATION: Levothyroxine  PHARMACY:  [facility]  IS THIS A 90 DAY SUPPLY : yes  IS PATIENT OUT OF MEDICATION:  no  IF NOT; HOW MUCH IS LEFT:   LAST APPOINTMENT DATE: @5 /29/2020  NEXT APPOINTMENT DATE:@6 /06/2019  DO WE HAVE YOUR PERMISSION TO LEAVE A DETAILED MESSAGE: yes  OTHER COMMENTS:    **Let patient know to contact pharmacy at the end of the day to make sure medication is ready. **  ** Please notify patient to allow 48-72 hours to process**  **Encourage patient to contact the pharmacy for refills or they can request refills through [facility]**

## 2018-12-24 NOTE — Progress Notes (Signed)
Per orders of Dr. Gherghe injection of Prolia given today by L. Earl Zellmer CMA . Patient tolerated injection well. 

## 2018-12-24 NOTE — Telephone Encounter (Signed)
RX sent

## 2019-03-21 ENCOUNTER — Other Ambulatory Visit: Payer: Self-pay

## 2019-03-21 ENCOUNTER — Ambulatory Visit (HOSPITAL_COMMUNITY)
Admission: EM | Admit: 2019-03-21 | Discharge: 2019-03-21 | Disposition: A | Discharge status: Home / Self Care | Payer: Medicare Other | Attending: Family Medicine | Admitting: Family Medicine

## 2019-03-21 ENCOUNTER — Encounter (HOSPITAL_COMMUNITY): Payer: Self-pay

## 2019-03-21 DIAGNOSIS — R3915 Urgency of urination: Secondary | ICD-10-CM

## 2019-03-21 DIAGNOSIS — R350 Frequency of micturition: Secondary | ICD-10-CM

## 2019-03-21 DIAGNOSIS — I10 Essential (primary) hypertension: Secondary | ICD-10-CM | POA: Diagnosis present

## 2019-03-21 DIAGNOSIS — R319 Hematuria, unspecified: Secondary | ICD-10-CM | POA: Insufficient documentation

## 2019-03-21 LAB — POCT URINALYSIS DIP (DEVICE)
Bilirubin Urine: NEGATIVE
Glucose, UA: NEGATIVE mg/dL
Ketones, ur: NEGATIVE mg/dL
Leukocytes,Ua: NEGATIVE
Nitrite: NEGATIVE
Protein, ur: NEGATIVE mg/dL
Specific Gravity, Urine: 1.01 (ref 1.005–1.030)
Urobilinogen, UA: 0.2 mg/dL (ref 0.0–1.0)
pH: 7 (ref 5.0–8.0)

## 2019-03-21 MED ORDER — CEPHALEXIN 500 MG PO CAPS: 500.0000 mg | ORAL_CAPSULE | Freq: Two times a day (BID) | ORAL | 0 refills | Status: AC

## 2019-03-21 NOTE — Discharge Instructions (Addendum)
Your urine does not show signs of an infection today.  It did have a small amount of blood so we are sending it for a culture to make sure you do not have an infection.    Take the antibiotic Keflex twice a day for 5 days or until the urine culture results are back.    Follow up with your primary care provider in 3 days.  Follow up sooner if your symptoms worsen.  Return here if you have difficulty with urination, fever, chills, abdominal pain, back pain, or other concerning symptoms.    Your blood pressure is elevated today at 158/77.  Please have this rechecked by your primary care provider in 2-4 weeks.

## 2019-03-21 NOTE — ED Triage Notes (Signed)
Pt report starting this morning she have an in crease urinary urgency.

## 2019-03-21 NOTE — ED Provider Notes (Addendum)
[place]    CSN: [id number] Arrival date & time: 03/21/19  1418      History   Chief Complaint Chief Complaint  Patient presents with  . Urinary Urgency    HPI Janice Small is a 85 y.o. female.   Patient presents with urinary frequency and urgency since this morning.  She denies painful urination, back pain, abdominal pain, fever, chills, or other symptoms.  She has a history of UTIs but states it has "been years" since she had one.  The history is provided by the patient.    Past Medical History:  Diagnosis Date  . BREAST BIOPSY, HX OF 11/27/2006  . COLONIC POLYPS, HX OF 11/27/2006  . HEMORRHOIDS 01/17/2010  . HYPERTENSION 11/27/2006  . HYPOTHYROIDISM 11/27/2006  . MENOPAUSAL SYNDROME 11/27/2006    Patient Active Problem List   Diagnosis Date Noted  . Vitamin D deficiency 12/03/2018  . Osteoporosis 06/30/2018  . Primary hyperparathyroidism ([place]) 06/30/2018  . Hypercalcemia 06/16/2011  . Hemorrhoids 01/17/2010  . Hypothyroidism 11/27/2006  . Essential hypertension 11/27/2006  . MENOPAUSAL SYNDROME 11/27/2006  . History of colonic polyps 11/27/2006  . BREAST BIOPSY, HX OF 11/27/2006    Past Surgical History:  Procedure Laterality Date  . ABDOMINAL HYSTERECTOMY    . APPENDECTOMY    . BREAST EXCISIONAL BIOPSY Right 1987  . BREAST SURGERY     bx  . TONSILLECTOMY      OB History   No obstetric history on file.      Home Medications    Prior to Admission medications   Medication Sig Start Date End Date Taking? Authorizing Provider  amLODipine (NORVASC) 10 MG tablet TAKE 1 TABLET DAILY 12/14/18   Isaac Bliss, Rayford Halsted, MD  levothyroxine (SYNTHROID) 75 MCG tablet Take 1 tablet (75 mcg total) by mouth daily. 12/24/18   Philemon Kingdom, MD  lisinopril (ZESTRIL) 10 MG tablet TAKE 1 TABLET DAILY 12/02/18   Isaac Bliss, Rayford Halsted, MD  metroNIDAZOLE (METROGEL) 0.75 % gel Apply 1 application topically at bedtime.  12/08/13   [provider]  Multiple Vitamin (MULTIVITAMIN) tablet Take 1 tablet by mouth daily.      [provider]    Family History Family History  Problem Relation Age of Onset  . Heart disease Father     Social History Social History   Tobacco Use  . Smoking status: Never Smoker  . Smokeless tobacco: Never Used  Substance Use Topics  . Alcohol use: Yes    Comment: rarely  . Drug use: No     Allergies   Codeine phosphate   Review of Systems Review of Systems  Constitutional: Negative for chills and fever.  HENT: Negative for ear pain and sore throat.   Eyes: Negative for pain and visual disturbance.  Respiratory: Negative for cough and shortness of breath.   Cardiovascular: Negative for chest pain and palpitations.  Gastrointestinal: Negative for abdominal pain and vomiting.  Genitourinary: Positive for frequency and urgency. Negative for dysuria, flank pain and hematuria.  Musculoskeletal: Negative for arthralgias and back pain.  Skin: Negative for color change and rash.  Neurological: Negative for seizures and syncope.  All other systems reviewed and are negative.    Physical Exam Triage Vital Signs ED Triage Vitals  Enc Vitals Group     BP 03/21/19 1441 (!) 158/77     Pulse Rate 03/21/19 1441 79     Resp --      Temp 03/21/19 1441 97.8 F (36.6  C)     Temp src --      SpO2 03/21/19 1441 97 %     Weight --      Height --      Head Circumference --      Peak Flow --      Pain Score 03/21/19 1439 0     Pain Loc --      Pain Edu? --      Excl. in [place]? --    No data found.  Updated Vital Signs BP (!) 158/77 (BP Location: Left Arm)   Pulse 79   Temp 97.8 F (36.6 C)   SpO2 97%   Visual Acuity Right Eye Distance:   Left Eye Distance:   Bilateral Distance:    Right Eye Near:   Left Eye Near:    Bilateral Near:     Physical Exam Vitals signs and nursing note reviewed.  Constitutional:      General: She is not in acute distress.    Appearance: She is  well-developed.  HENT:     Head: Normocephalic and atraumatic.     Mouth/Throat:     Mouth: Mucous membranes are moist.     Pharynx: Oropharynx is clear.  Eyes:     Conjunctiva/sclera: Conjunctivae normal.  Neck:     Musculoskeletal: Neck supple.  Cardiovascular:     Rate and Rhythm: Normal rate and regular rhythm.     Heart sounds: No murmur.  Pulmonary:     Effort: Pulmonary effort is normal. No respiratory distress.     Breath sounds: Normal breath sounds.  Abdominal:     General: Bowel sounds are normal.     Palpations: Abdomen is soft.     Tenderness: There is no abdominal tenderness. There is no right CVA tenderness, left CVA tenderness, guarding or rebound.  Skin:    General: Skin is warm and dry.  Neurological:     Mental Status: She is alert.      UC Treatments / Results  Labs (all labs ordered are listed, but only abnormal results are displayed) Labs Reviewed  POCT URINALYSIS DIP (DEVICE) - Abnormal; Notable for the following components:      Result Value   Hgb urine dipstick TRACE (*)    All other components within normal limits  URINE CULTURE    EKG   Radiology No results found.  Procedures Procedures (including critical care time)  Medications Ordered in UC Medications - No data to display  Initial Impression / Assessment and Plan / UC Course  I have reviewed the triage vital signs and the nursing notes.  Pertinent labs & imaging results that were available during my care of the patient were reviewed by me and considered in my medical decision making (see chart for details).    Hematuria, Urinary urgency and frequency.  Elevated blood pressure with known hypertension.  Urine dip negative except trace blood; urine culture pending.  Due to symptoms, treating with Keflex until urine culture is back.  Instructed patient to follow-up with her PCP in 3 days; or sooner if her symptoms worsen.  Discussed with patient that her blood pressure is elevated  today and that she should follow-up with her PCP to have this rechecked.  Instructed patient to return here if she has dysuria, fever, chills, abdominal pain, back pain, or other concerning symptoms.  Patient agrees with plan of care.     Final Clinical Impressions(s) / UC Diagnoses   Final diagnoses:  Urgency  of urination  Elevated blood pressure reading in office with diagnosis of hypertension  Urinary frequency     Discharge Instructions     Your urine does not show signs of an infection today.  It did have a small amount of blood so we are sending it for a culture to make sure you do not have an infection.    Follow up with your primary care provider in 3 days.  Follow up sooner if your symptoms worsen.  Return here if you have difficulty with urination, fever, chills, abdominal pain, back pain, or other concerning symptoms.    Your blood pressure is elevated today at 158/77.  Please have this rechecked by your primary care provider in 2-4 weeks.          ED Prescriptions    None     Controlled Substance Prescriptions [place] Controlled Substance Registry consulted? Not Applicable   Sharion Balloon, NP 03/21/19 [place], NP 03/21/19 [phone]

## 2019-03-22 LAB — URINE CULTURE: Culture: NO GROWTH

## 2019-03-25 ENCOUNTER — Ambulatory Visit: Payer: Medicare Other | Admitting: Internal Medicine

## 2019-03-28 ENCOUNTER — Telehealth: Payer: Self-pay | Admitting: Internal Medicine

## 2019-03-28 NOTE — Telephone Encounter (Signed)
Patient is calling for results from Urine culture taken in the hospital on 03/28/2019. Patient was advised to contact PCP for results. Please advise [phone]

## 2019-03-29 NOTE — Telephone Encounter (Signed)
Pt calling back to check status.

## 2019-03-30 NOTE — Telephone Encounter (Signed)
Patient is aware 

## 2019-03-30 NOTE — Telephone Encounter (Signed)
There is a urine cx from 9/7. That one was negative. I do not see a urine cx on 9/14.

## 2019-05-10 ENCOUNTER — Other Ambulatory Visit: Payer: Self-pay

## 2019-05-12 ENCOUNTER — Encounter: Payer: Self-pay | Admitting: Internal Medicine

## 2019-05-12 ENCOUNTER — Ambulatory Visit: Payer: Medicare Other | Admitting: Internal Medicine

## 2019-05-12 VITALS — BP 164/70 | HR 81 | Ht 60.0 in | Wt 151.6 lb

## 2019-05-12 DIAGNOSIS — E039 Hypothyroidism, unspecified: Secondary | ICD-10-CM

## 2019-05-12 DIAGNOSIS — E210 Primary hyperparathyroidism: Secondary | ICD-10-CM

## 2019-05-12 DIAGNOSIS — Z23 Encounter for immunization: Secondary | ICD-10-CM

## 2019-05-12 DIAGNOSIS — M818 Other osteoporosis without current pathological fracture: Secondary | ICD-10-CM

## 2019-05-12 DIAGNOSIS — E559 Vitamin D deficiency, unspecified: Secondary | ICD-10-CM | POA: Diagnosis not present

## 2019-05-12 NOTE — Progress Notes (Signed)
Patient ID: Janice Small, female   DOB: Feb 11, 1928, 85 y.o.   MRN: [id number]    HPI  Janice Small is a 85 y.o.-year-old female, initially referred by her PCP, Dr. Jerilee Hoh, returning for follow-up for hypercalcemia/hyperparathyroidism, vitamin D insufficiency, and hypothyroidism.  Last visit 7 months ago.  Pt was has had hypercalcemia for many years, at least since 2008.  At the end of 2019 she was also found to have a high PTH was referred to endocrinology.  This decreased after improving her vitamin D level.  I reviewed patient's pertinent labs: Lab Results  Component Value Date   PTH 32 12/03/2018   PTH 45 09/23/2018   PTH 92 (H) 06/14/2018   Lab Results  Component Value Date   CALCIUM 11.3 (H) 12/03/2018   CALCIUM 11.0 (H) 12/03/2018   CALCIUM 11.6 (H) 09/23/2018   CALCIUM 10.6 (H) 06/14/2018   CALCIUM 10.7 (H) 06/04/2018   CALCIUM 10.6 (H) 10/12/2017   CALCIUM 10.4 10/08/2016   CALCIUM 11.0 (H) 10/01/2015   CALCIUM 10.8 (H) 09/29/2014   CALCIUM 10.7 (H) 06/17/2013   CALCIUM 11.3 (H) 06/16/2012   CALCIUM 10.4 06/16/2011   CALCIUM 11.0 (H) 06/12/2010   CALCIUM 11.0 (H) 06/04/2009   CALCIUM 10.7 (H) 05/31/2008   CALCIUM 10.9 (H) 04/14/2007   Not on calcium supplements.  She had the following investigations so far: Thyroid ultrasound (08/20/2018): 0.7 x 0.5 x 0.6 nodule at the left inferior thyroid pole, possibly a parathyroid adenoma  Technetium sestamibi parathyroid scan (08/20/2018): Suspicious for left inferior parathyroid adenoma  Patient would like to avoid surgery as much as possible.  She also has osteoporosis:  Reviewed most recent DXA scan report: 06/29/2018 Lumbar spine L1-L4 Femoral neck (FN) 33% distal radius  ultra distal radius  T-score  +1.1 RFN: -2.3 LFN: -1.9  -3.8  -4.5   She has a history of ankle fracture several years ago.  We started Prolia 12/24/2018.  She tolerates this well.  She was going to [employer] - 3x a week - weight  bearing exercises. Now stopped 2/2 coronavs pandemic.  She has a h/o scoliosis.  No history of kidney stones.  No history of CKD.  Latest BUN/Cr: Lab Results  Component Value Date   BUN 25 (H) 12/03/2018   BUN 28 (H) 09/23/2018   CREATININE 0.89 12/03/2018   CREATININE 0.90 (H) 09/23/2018   She is not on HCTZ.  She has a history of vitamin D insufficiency: Lab Results  Component Value Date   VD25OH 32.98 12/03/2018   VD25OH 23.46 (L) 09/23/2018  She is on vitamin D 2000 units daily.  Pt does not have a FH of hypercalcemia, pituitary tumors, thyroid cancer, or osteoporosis.   Pt. also has a history of controlled hypothyroidism:  Pt is on levothyroxine 75 mcg daily, taken: - in am - fasting - at least 30 min from b'fast - no Ca, Fe, MVI, PPIs - not on Biotin  Reviewed her TSH levels: Lab Results  Component Value Date   TSH 1.47 12/03/2018   TSH 2.30 09/23/2018   TSH 2.15 10/12/2017   TSH 2.54 10/08/2016   TSH 1.93 10/01/2015   TSH 1.69 09/29/2014   TSH 2.46 06/17/2013   TSH 1.21 06/16/2012   TSH 2.09 06/16/2011   TSH 1.84 06/12/2010   TSH 1.82 06/04/2009   TSH 1.85 05/31/2008   TSH 2.11 04/14/2007   On B12, Magnesium 120 mg, Vit C.  She also has a history of HTN.  ROS:  Constitutional: no weight gain/no weight loss, no fatigue, no subjective hyperthermia, no subjective hypothermia Eyes: no blurry vision, no xerophthalmia ENT: no sore throat, no nodules palpated in neck, no dysphagia, no odynophagia, no hoarseness Cardiovascular: no CP/no SOB/no palpitations/no leg swelling Respiratory: no cough/no SOB/no wheezing Gastrointestinal: no N/no V/no D/no C/no acid reflux Musculoskeletal: + muscle aches/+ joint aches Skin: no rashes, no hair loss Neurological: no tremors/no numbness/no tingling/no dizziness  I reviewed pt's medications, allergies, PMH, social hx, family hx, and changes were documented in the history of present illness. Otherwise, unchanged from  my initial visit note.  Past Medical History:  Diagnosis Date  . BREAST BIOPSY, HX OF 11/27/2006  . COLONIC POLYPS, HX OF 11/27/2006  . HEMORRHOIDS 01/17/2010  . HYPERTENSION 11/27/2006  . HYPOTHYROIDISM 11/27/2006  . MENOPAUSAL SYNDROME 11/27/2006   Past Surgical History:  Procedure Laterality Date  . ABDOMINAL HYSTERECTOMY    . APPENDECTOMY    . BREAST EXCISIONAL BIOPSY Right 1987  . BREAST SURGERY     bx  . TONSILLECTOMY     Social History   Socioeconomic History  . Marital status: Widowed -husband died 24    Spouse name: Not on file  . Number of children: 3  . Years of education: Not on file  . Highest education level: Not on file  Occupational History  .  Retired  Scientific laboratory technician  . Financial resource strain: Not on file  . Food insecurity:    Worry: Not on file    Inability: Not on file  . Transportation needs:    Medical: Not on file    Non-medical: Not on file  Tobacco Use  . Smoking status: Never Smoker  . Smokeless tobacco: Never Used  Substance and Sexual Activity  . Alcohol use:  No      . Drug use: No   Current Outpatient Medications on File Prior to Visit  Medication Sig Dispense Refill  . amLODipine (NORVASC) 10 MG tablet TAKE 1 TABLET DAILY 90 tablet 1  . levothyroxine (SYNTHROID) 75 MCG tablet Take 1 tablet (75 mcg total) by mouth daily. 90 tablet 1  . lisinopril (ZESTRIL) 10 MG tablet TAKE 1 TABLET DAILY 90 tablet 1  . metroNIDAZOLE (METROGEL) 0.75 % gel Apply 1 application topically at bedtime.     . Multiple Vitamin (MULTIVITAMIN) tablet Take 1 tablet by mouth daily.       No current facility-administered medications on file prior to visit.    Allergies  Allergen Reactions  . Codeine Phosphate     REACTION: unspecified   Family History  Problem Relation Age of Onset  . Heart disease Father   Also, diabetes and HTN and son.  PE: BP (!) 164/70 (BP Location: Right Arm, Patient Position: Sitting, Cuff Size: Normal)   Pulse 81   Ht 5' (1.524  m)   Wt 151 lb 9.6 oz (68.8 kg)   SpO2 99%   BMI 29.61 kg/m  Wt Readings from Last 3 Encounters:  05/12/19 151 lb 9.6 oz (68.8 kg)  12/21/18 149 lb 14.4 oz (68 kg)  12/03/18 149 lb 4.8 oz (67.7 kg)   Constitutional: normal weight, in NAD Eyes: PERRLA, EOMI, no exophthalmos ENT: moist mucous membranes, no thyromegaly, no cervical lymphadenopathy Cardiovascular: RRR, No MRG Respiratory: CTA B Gastrointestinal: abdomen soft, NT, ND, BS+ Musculoskeletal: no deformities, strength intact in all 4 Skin: moist, warm, no rashes Neurological: no tremor with outstretched hands, DTR normal in all 4  Assessment: 1. Hypercalcemia/hyperparathyroidism  2.  Hypothyroidism  3.  Vitamin D insufficiency  4.  Osteoporosis  Plan: Patient has had several instances of elevated calcium, with the highest level being at 11.6.  A corresponding intact PTH level was also high, and 92.  However, after normalizing her vitamin D level, this decreased to 32, which is still inappropriately high in the setting of a calcium level of 11.3. -Reviewed vitamin D level and this has been normal at last check in 11/2018. -She does have possible complications from hypercalcemia: Osteoporosis, more pronounced at the level of the 33% distal radius and ultra distal radius and has a history of ankle fracture. No abdominal pain, depression, bone pain, kidney stones. -I reviewed the previous imaging test reports and she does appear to have a left inferior parathyroid adenoma -Her calcium level has been elevated for at least 12 years and did not follow an upward trend.  Also, she does not have nephrolithiasis.  She does have severe osteoporosis, for which we started Prolia.  The decision to send her to surgery will need to include her age and also her preference.  Her preference would be to avoid surgery.  I think this is reasonable for now but we will need to continue to follow her calcium levels. - we we will check her PTH and  calcium today, now on Prolia. - I we will see the patient back in 6 months  2. Hypothyroidism - latest thyroid labs reviewed with pt >> normal 11/2018  - she continues on LT4 75 mcg daily - pt feels good on this dose. - we discussed about taking the thyroid hormone every day, with water, >30 minutes before breakfast, separated by >4 hours from acid reflux medications, calcium, iron, multivitamins. Pt. is taking it correctly.  3.  Vitamin D insufficiency -Patient had a low vitamin D level in 09/2018 after which we started her on 2000 units vitamin D daily -On this dose, latest vitamin D level in 11/2018 was normal -Continue current supplement dose and recheck level today  4. OP -Likely postmenopausal/age-related + secondary to hyperparathyroidism -Reviewed her bone density results from 2019.  They show significant osteoporosis at the radius level.  This 33% distal radius reflects the quality of her cortical bone, which is impressed by the PTH.  The ultra distal radius reflects the quality of her trabecular bone, and this can be used as a surrogate BMD marker for the spine.  The spine appears to have a higher bone density most likely due to calcium accumulation in the setting of osteoarthritis -We started Prolia in 12/2018 and she tolerates this well.  No thigh or hip pain, no jaw pain. -Next injection is due in 06/2019 and we discussed about spacing the injections out more than 7 months -We will continue with Prolia for up to 10 years -We discussed that Prolia can also result in improvement in her calcium levels -Next DEXA scan is due in 06/2020 -I will see the patient back in 6 mo  Will give her the flu shot today.  Component     Latest Ref Rng & Units 05/12/2019          Calcium     8.7 - 10.3 mg/dL 10.5 (H)  PTH, Intact     15 - 65 pg/mL 69 (H)  PTH Interp      Comment  VITD     30.00 - 100.00 ng/mL 48.47   PTH is higher than before, slightly higher than normal but calcium  improved significantly.  Based on her preference to avoid surgery, will continue current management and recheck the tests at next visit.  Vitamin D is normal.  Philemon Kingdom, MD PhD [facility] Endocrinology

## 2019-05-12 NOTE — Patient Instructions (Signed)
Please stop at the lab.  Continue vitamin D 2000 units daily.  Continue Levothyroxine 75 mcg daily.  Take the thyroid hormone every day, with water, at least 30 minutes before breakfast, separated by at least 4 hours from: - acid reflux medications - calcium - iron - multivitamins  Please come back for a follow-up appointment in 6 months.

## 2019-05-13 LAB — PTH, INTACT AND CALCIUM
Calcium: 10.5 mg/dL — ABNORMAL HIGH (ref 8.7–10.3)
PTH: 69 pg/mL — ABNORMAL HIGH (ref 15–65)

## 2019-05-13 LAB — VITAMIN D 25 HYDROXY (VIT D DEFICIENCY, FRACTURES): VITD: 48.47 ng/mL (ref 30.00–100.00)

## 2019-05-17 ENCOUNTER — Other Ambulatory Visit: Payer: Self-pay | Admitting: Internal Medicine

## 2019-05-17 DIAGNOSIS — I10 Essential (primary) hypertension: Secondary | ICD-10-CM

## 2019-05-17 MED ORDER — LISINOPRIL 10 MG PO TABS: 10.0000 mg | ORAL_TABLET | Freq: Every day | ORAL | 1 refills | Status: DC

## 2019-05-17 MED ORDER — AMLODIPINE BESYLATE 10 MG PO TABS: 10.0000 mg | ORAL_TABLET | Freq: Every day | ORAL | 1 refills | Status: DC

## 2019-05-17 NOTE — Telephone Encounter (Signed)
Patient requesting a 90 day supply amLODipine (NORVASC) 10 MG tablet and lisinopril (ZESTRIL) 10 MG tablet, informed please allow 48 to 72 hour turn around time    [place], [place]

## 2019-06-22 ENCOUNTER — Telehealth: Payer: Self-pay | Admitting: Internal Medicine

## 2019-06-22 ENCOUNTER — Encounter: Payer: Self-pay | Admitting: Internal Medicine

## 2019-06-22 ENCOUNTER — Other Ambulatory Visit: Payer: Self-pay

## 2019-06-22 ENCOUNTER — Ambulatory Visit: Payer: Medicare Other | Admitting: Internal Medicine

## 2019-06-22 VITALS — BP 130/68 | HR 82 | Temp 97.4°F | Wt 150.0 lb

## 2019-06-22 DIAGNOSIS — I10 Essential (primary) hypertension: Secondary | ICD-10-CM

## 2019-06-22 DIAGNOSIS — E210 Primary hyperparathyroidism: Secondary | ICD-10-CM | POA: Diagnosis not present

## 2019-06-22 DIAGNOSIS — M818 Other osteoporosis without current pathological fracture: Secondary | ICD-10-CM | POA: Diagnosis not present

## 2019-06-22 DIAGNOSIS — E559 Vitamin D deficiency, unspecified: Secondary | ICD-10-CM | POA: Diagnosis not present

## 2019-06-22 DIAGNOSIS — E039 Hypothyroidism, unspecified: Secondary | ICD-10-CM

## 2019-06-22 DIAGNOSIS — Z86010 Personal history of colonic polyps: Secondary | ICD-10-CM

## 2019-06-22 MED ORDER — LEVOTHYROXINE SODIUM 75 MCG PO TABS: 75.0000 ug | ORAL_TABLET | Freq: Every day | ORAL | 1 refills | Status: DC

## 2019-06-22 NOTE — Telephone Encounter (Signed)
RX sent

## 2019-06-22 NOTE — Patient Instructions (Signed)
-  Nice seeing you today!!  -See you back in 6 months.

## 2019-06-22 NOTE — Progress Notes (Signed)
Established Patient Office Visit     This visit occurred during the SARS-CoV-2 public health emergency.  Safety protocols were in place, including screening questions prior to the visit, additional usage of staff PPE, and extensive cleaning of exam room while observing appropriate contact time as indicated for disinfecting solutions.    CC/Reason for Visit: 73-month follow-up of chronic conditions  HPI: Janice Small is a 85 y.o. female who is coming in today for the above mentioned reasons. Past Medical History is significant for: Hypertension and hypothyroidism that have been well controlled, osteoporosis, vitamin D deficiency and hypercalcemia for which endocrinology is involved and is receiving Prolia injections through them.  She has no acute complaints today.   Past Medical/Surgical History: Past Medical History:  Diagnosis Date  . BREAST BIOPSY, HX OF 11/27/2006  . COLONIC POLYPS, HX OF 11/27/2006  . HEMORRHOIDS 01/17/2010  . HYPERTENSION 11/27/2006  . HYPOTHYROIDISM 11/27/2006  . MENOPAUSAL SYNDROME 11/27/2006    Past Surgical History:  Procedure Laterality Date  . ABDOMINAL HYSTERECTOMY    . APPENDECTOMY    . BREAST EXCISIONAL BIOPSY Right 1987  . BREAST SURGERY     bx  . TONSILLECTOMY      Social History:  reports that she has never smoked. She has never used smokeless tobacco. She reports current alcohol use. She reports that she does not use drugs.  Allergies: Allergies  Allergen Reactions  . Codeine Phosphate     REACTION: unspecified    Family History:  Family History  Problem Relation Age of Onset  . Heart disease Father      Current Outpatient Medications:  .  amLODipine (NORVASC) 10 MG tablet, Take 1 tablet (10 mg total) by mouth daily., Disp: 90 tablet, Rfl: 1 .  levothyroxine (SYNTHROID) 75 MCG tablet, Take 1 tablet (75 mcg total) by mouth daily., Disp: 90 tablet, Rfl: 1 .  lisinopril (ZESTRIL) 10 MG tablet, Take 1 tablet (10 mg total) by  mouth daily., Disp: 90 tablet, Rfl: 1 .  metroNIDAZOLE (METROGEL) 0.75 % gel, Apply 1 application topically at bedtime. , Disp: , Rfl:  .  Multiple Vitamin (MULTIVITAMIN) tablet, Take 1 tablet by mouth daily.  , Disp: , Rfl:   Review of Systems:  Constitutional: Denies fever, chills, diaphoresis, appetite change and fatigue.  HEENT: Denies photophobia, eye pain, redness, hearing loss, ear pain, congestion, sore throat, rhinorrhea, sneezing, mouth sores, trouble swallowing, neck pain, neck stiffness and tinnitus.   Respiratory: Denies SOB, DOE, cough, chest tightness,  and wheezing.   Cardiovascular: Denies chest pain, palpitations and leg swelling.  Gastrointestinal: Denies nausea, vomiting, abdominal pain, diarrhea, constipation, blood in stool and abdominal distention.  Genitourinary: Denies dysuria, urgency, frequency, hematuria, flank pain and difficulty urinating.  Endocrine: Denies: hot or cold intolerance, sweats, changes in hair or nails, polyuria, polydipsia. Musculoskeletal: Denies myalgias, back pain, joint swelling, arthralgias and gait problem.  Skin: Denies pallor, rash and wound.  Neurological: Denies dizziness, seizures, syncope, weakness, light-headedness, numbness and headaches.  Hematological: Denies adenopathy. Easy bruising, personal or family bleeding history  Psychiatric/Behavioral: Denies suicidal ideation, mood changes, confusion, nervousness, sleep disturbance and agitation    Physical Exam: Vitals:   06/22/19 1029  BP: 130/68  Pulse: 82  Temp: (!) 97.4 F (36.3 C)  TempSrc: Temporal  SpO2: 98%  Weight: 150 lb (68 kg)    Body mass index is 29.29 kg/m.   Constitutional: NAD, calm, comfortable Eyes: PERRL, lids and conjunctivae normal, wears corrective lenses ENMT: Mucous  membranes are moist.  Respiratory: clear to auscultation bilaterally, no wheezing, no crackles. Normal respiratory effort. No accessory muscle use.  Cardiovascular: Regular rate and  rhythm, no murmurs / rubs / gallops. No extremity edema. 2+ pedal pulses.  Abdomen: no tenderness, no masses palpated. No hepatosplenomegaly. Bowel sounds positive.  Musculoskeletal: no clubbing / cyanosis. No joint deformity upper and lower extremities. Good ROM, no contractures. Normal muscle tone.  Skin: no rashes, lesions, ulcers. No induration Neurologic: Grossly intact and nonfocal  Psychiatric: Normal judgment and insight. Alert and oriented x 3. Normal mood.    Impression and Plan:  Vitamin D deficiency Other osteoporosis without current pathological fracture Primary hyperparathyroidism ([place]) Hypercalcemia -Being managed by endocrinology, on Prolia.  Acquired hypothyroidism -On Synthroid, last TSH was 1.470 in May 2020.  Essential hypertension -Well-controlled on lisinopril and amlodipine.  History of colonic polyps -She has elected to forego further colonoscopies given her age, I do not disagree.    Patient Instructions  -Nice seeing you today!!  -See you back in 6 months.     [name], MD [facility]

## 2019-06-22 NOTE — Telephone Encounter (Signed)
MEDICATION: levothyroxine (SYNTHROID) 75 MCG tablet  PHARMACY:   [place], [place] - [address] [phone] (Phone) [phone] (Fax)    IS THIS A 90 DAY SUPPLY : Yes  IS PATIENT OUT OF MEDICATION: No  IF NOT; HOW MUCH IS LEFT: approx. 14 tablets  LAST APPOINTMENT DATE: @10 /29/2020  NEXT APPOINTMENT DATE:@4 /28/2021  DO WE HAVE YOUR PERMISSION TO LEAVE A DETAILED MESSAGE: Yes  OTHER COMMENTS:    **Let patient know to contact pharmacy at the end of the day to make sure medication is ready. **  ** Please notify patient to allow 48-72 hours to process**  **Encourage patient to contact the pharmacy for refills or they can request refills through [facility]**

## 2019-06-22 NOTE — Telephone Encounter (Signed)
Patient requests to be called at ph# [phone] when it is time for patient's Prolia injection to be scheduled

## 2019-07-05 NOTE — Telephone Encounter (Signed)
Pt has been submitted to the prolia portal awaiting determination

## 2019-07-19 NOTE — Telephone Encounter (Signed)
Patient called in wanting to know when she could get her injection prolia

## 2019-07-26 NOTE — Telephone Encounter (Signed)
Summary of Benefits are in but there was an issue with it so I will call patient to schedule once I have worked it out with reps tomorrow

## 2019-07-26 NOTE — Telephone Encounter (Signed)
I spoke with this patient and made her aware that I would run her through the portal again for insurance verification since the new year has started and patient has agreed to this-I am waiting for summary of benefits to come back so I can call and schedule a nurse visit

## 2019-07-28 NOTE — Telephone Encounter (Signed)
Scheduled patient for nurse visit to get Prolia- she owes $450 at check-in due to deductible not being met yet

## 2019-08-02 ENCOUNTER — Ambulatory Visit: Payer: Medicare Other

## 2019-08-02 ENCOUNTER — Other Ambulatory Visit: Payer: Self-pay

## 2019-08-04 ENCOUNTER — Ambulatory Visit (INDEPENDENT_AMBULATORY_CARE_PROVIDER_SITE_OTHER): Payer: Medicare Other

## 2019-08-04 ENCOUNTER — Other Ambulatory Visit: Payer: Self-pay

## 2019-08-04 DIAGNOSIS — M818 Other osteoporosis without current pathological fracture: Secondary | ICD-10-CM | POA: Diagnosis not present

## 2019-08-04 MED ORDER — DENOSUMAB 60 MG/ML ~~LOC~~ SOSY
60.0000 mg | PREFILLED_SYRINGE | Freq: Once | SUBCUTANEOUS | Status: AC
  Administered 2019-08-04: 60 mg via SUBCUTANEOUS

## 2019-08-04 NOTE — Progress Notes (Signed)
Per orders of Dr. Gherghe injection of Prolia given today by Delson Dulworth, Certified Medical Assistant . Patient tolerated injection well.  

## 2019-08-29 ENCOUNTER — Telehealth: Payer: Self-pay | Admitting: *Deleted

## 2019-08-29 DIAGNOSIS — I10 Essential (primary) hypertension: Secondary | ICD-10-CM

## 2019-08-29 NOTE — Telephone Encounter (Signed)
Patient called to speak with Dr. Jerilee Hoh nurse regarding where her medications will be sent to from now on. Patient stated that she will no longer use [facility] and was told something about [facility] when she called the insurance company. Patient would like to discuss issue with Dr. Jerilee Hoh nurse.

## 2019-08-30 NOTE — Telephone Encounter (Signed)
Attempted to call patient, but unable to leave a message.  Which refills are needed, and which pharmacy?

## 2019-08-31 MED ORDER — AMLODIPINE BESYLATE 10 MG PO TABS: 10.0000 mg | ORAL_TABLET | Freq: Every day | ORAL | 1 refills | Status: DC

## 2019-08-31 MED ORDER — LISINOPRIL 10 MG PO TABS: 10.0000 mg | ORAL_TABLET | Freq: Every day | ORAL | 1 refills | Status: DC

## 2019-08-31 NOTE — Telephone Encounter (Signed)
Spoke with patient and she now uses [facility]. Refills sent

## 2019-08-31 NOTE — Telephone Encounter (Signed)
Pt called back and would like a call from nurse. She said she tried to answer call yesterday but no one was there when she answered.

## 2019-08-31 NOTE — Addendum Note (Signed)
Addended by: Westley Hummer B on: 08/31/2019 11:00 AM   Modules accepted: Orders

## 2019-09-01 ENCOUNTER — Other Ambulatory Visit: Payer: Self-pay

## 2019-09-01 ENCOUNTER — Encounter (HOSPITAL_COMMUNITY): Payer: Self-pay | Admitting: Emergency Medicine

## 2019-09-01 ENCOUNTER — Ambulatory Visit (HOSPITAL_COMMUNITY)
Admission: EM | Admit: 2019-09-01 | Discharge: 2019-09-01 | Disposition: A | Discharge status: Home / Self Care | Payer: Medicare Other | Attending: Internal Medicine | Admitting: Internal Medicine

## 2019-09-01 DIAGNOSIS — E039 Hypothyroidism, unspecified: Secondary | ICD-10-CM | POA: Insufficient documentation

## 2019-09-01 DIAGNOSIS — Z20822 Contact with and (suspected) exposure to covid-19: Secondary | ICD-10-CM | POA: Insufficient documentation

## 2019-09-01 DIAGNOSIS — R05 Cough: Secondary | ICD-10-CM | POA: Diagnosis present

## 2019-09-01 DIAGNOSIS — Z79899 Other long term (current) drug therapy: Secondary | ICD-10-CM | POA: Insufficient documentation

## 2019-09-01 DIAGNOSIS — R059 Cough, unspecified: Secondary | ICD-10-CM

## 2019-09-01 DIAGNOSIS — Z8249 Family history of ischemic heart disease and other diseases of the circulatory system: Secondary | ICD-10-CM | POA: Insufficient documentation

## 2019-09-01 DIAGNOSIS — Z79890 Hormone replacement therapy (postmenopausal): Secondary | ICD-10-CM | POA: Insufficient documentation

## 2019-09-01 DIAGNOSIS — Z885 Allergy status to narcotic agent status: Secondary | ICD-10-CM | POA: Diagnosis not present

## 2019-09-01 DIAGNOSIS — I10 Essential (primary) hypertension: Secondary | ICD-10-CM | POA: Insufficient documentation

## 2019-09-01 NOTE — ED Provider Notes (Signed)
[facility]    CSN: [id number] Arrival date & time: 09/01/19  1716      History   Chief Complaint Chief Complaint  Patient presents with  . Appointment    5:30  . Cough    HPI Janice Small is a 86 y.o. female with history of hypothyroidism and hypertension comes to the urgent care for nonproductive cough which started yesterday.  Patient denies any general malaise, fever or chills.  No upper respiratory infection symptoms or sore throat.  She has a history of postnasal drip which he uses saline nasal drops for.  No body aches or sick contacts.  Patient comes in to be evaluated.  She denies any shortness of breath, wheezing or sputum production.  HPI  Past Medical History:  Diagnosis Date  . BREAST BIOPSY, HX OF 11/27/2006  . COLONIC POLYPS, HX OF 11/27/2006  . HEMORRHOIDS 01/17/2010  . HYPERTENSION 11/27/2006  . HYPOTHYROIDISM 11/27/2006  . MENOPAUSAL SYNDROME 11/27/2006    Patient Active Problem List   Diagnosis Date Noted  . Vitamin D deficiency 12/03/2018  . Osteoporosis 06/30/2018  . Primary hyperparathyroidism ([place]) 06/30/2018  . Hypercalcemia 06/16/2011  . Hemorrhoids 01/17/2010  . Hypothyroidism 11/27/2006  . Essential hypertension 11/27/2006  . MENOPAUSAL SYNDROME 11/27/2006  . History of colonic polyps 11/27/2006  . BREAST BIOPSY, HX OF 11/27/2006    Past Surgical History:  Procedure Laterality Date  . ABDOMINAL HYSTERECTOMY    . APPENDECTOMY    . BREAST EXCISIONAL BIOPSY Right 1987  . BREAST SURGERY     bx  . TONSILLECTOMY      OB History   No obstetric history on file.      Home Medications    Prior to Admission medications   Medication Sig Start Date End Date Taking? Authorizing Provider  amLODipine (NORVASC) 10 MG tablet Take 1 tablet (10 mg total) by mouth daily. 08/31/19   Isaac Bliss, Rayford Halsted, MD  levothyroxine (SYNTHROID) 75 MCG tablet Take 1 tablet (75 mcg total) by mouth daily. 06/22/19   Philemon Kingdom, MD    lisinopril (ZESTRIL) 10 MG tablet Take 1 tablet (10 mg total) by mouth daily. 08/31/19   Isaac Bliss, Rayford Halsted, MD  metroNIDAZOLE (METROGEL) 0.75 % gel Apply 1 application topically at bedtime.  12/08/13   [provider]  Multiple Vitamin (MULTIVITAMIN) tablet Take 1 tablet by mouth daily.      [provider]    Family History Family History  Problem Relation Age of Onset  . Heart disease Father     Social History Social History   Tobacco Use  . Smoking status: Never Smoker  . Smokeless tobacco: Never Used  Substance Use Topics  . Alcohol use: Yes    Comment: rarely  . Drug use: No     Allergies   Codeine phosphate   Review of Systems Review of Systems  Constitutional: Negative for activity change, chills, fatigue and fever.  HENT: Positive for congestion and postnasal drip. Negative for sinus pressure and sinus pain.   Respiratory: Negative for chest tightness, shortness of breath and wheezing.   Cardiovascular: Negative for chest pain and palpitations.  Gastrointestinal: Negative for diarrhea, nausea and vomiting.  Genitourinary: Negative.   Musculoskeletal: Negative for arthralgias, back pain and myalgias.  Skin: Negative for pallor and wound.  Neurological: Negative for dizziness, light-headedness, numbness and headaches.  Psychiatric/Behavioral: Negative for confusion and decreased concentration.     Physical Exam Triage Vital Signs ED Triage Vitals  Enc  Vitals Group     BP 09/01/19 1734 (!) 152/68     Pulse Rate 09/01/19 1734 92     Resp 09/01/19 1734 18     Temp 09/01/19 1734 98.8 F (37.1 C)     Temp Source 09/01/19 1734 Oral     SpO2 09/01/19 1734 97 %     Weight --      Height --      Head Circumference --      Peak Flow --      Pain Score 09/01/19 1731 0     Pain Loc --      Pain Edu? --      Excl. in [place]? --    No data found.  Updated Vital Signs BP (!) 152/68 (BP Location: Left Arm)   Pulse 92   Temp 98.8 F  (37.1 C) (Oral)   Resp 18   SpO2 97%   Visual Acuity Right Eye Distance:   Left Eye Distance:   Bilateral Distance:    Right Eye Near:   Left Eye Near:    Bilateral Near:     Physical Exam Vitals and nursing note reviewed.  Constitutional:      General: She is not in acute distress.    Appearance: Normal appearance. She is not ill-appearing.  HENT:     Right Ear: Tympanic membrane normal.     Left Ear: Tympanic membrane normal.     Nose: No congestion or rhinorrhea.     Mouth/Throat:     Pharynx: No oropharyngeal exudate or posterior oropharyngeal erythema.  Cardiovascular:     Rate and Rhythm: Normal rate and regular rhythm.     Pulses: Normal pulses.     Heart sounds: Normal heart sounds. No murmur. No friction rub.  Pulmonary:     Effort: Pulmonary effort is normal. No respiratory distress.     Breath sounds: No wheezing or rhonchi.  Abdominal:     General: Bowel sounds are normal. There is no distension.     Palpations: Abdomen is soft.     Tenderness: There is no guarding or rebound.  Musculoskeletal:        General: No swelling or signs of injury. Normal range of motion.  Skin:    General: Skin is warm.     Capillary Refill: Capillary refill takes less than 2 seconds.     Findings: No bruising or erythema.  Neurological:     General: No focal deficit present.     Mental Status: She is alert and oriented to person, place, and time.      UC Treatments / Results  Labs (all labs ordered are listed, but only abnormal results are displayed) Labs Reviewed  NOVEL CORONAVIRUS, NAA (HOSP ORDER, SEND-OUT TO REF LAB; TAT 18-24 HRS)    EKG   Radiology No results found.  Procedures Procedures (including critical care time)  Medications Ordered in UC Medications - No data to display  Initial Impression / Assessment and Plan / UC Course  I have reviewed the triage vital signs and the nursing notes.  Pertinent labs & imaging results that were available  during my care of the patient were reviewed by me and considered in my medical decision making (see chart for details).     1.  Cough: COVID-19 PCR test sent Patient is advised to self isolate until COVID-19 test results are available I offered fluticasone nasal spray but the patient prefers to stick with saline nasal spray. If patient  symptoms worsens or develops new symptoms she is welcome to return to urgent care to be reevaluated. Final Clinical Impressions(s) / UC Diagnoses   Final diagnoses:  Cough   Discharge Instructions   None    ED Prescriptions    None     PDMP not reviewed this encounter.   Chase Picket, MD 09/01/19 [phone]

## 2019-09-01 NOTE — ED Triage Notes (Signed)
Onset of symptoms 2-3 days ago.  Noticed blood tinge to nasal secretions last week.  Patient has been coughing, tightness in throat, patient spit up phlegm yesterday, yellow phlegm.  Denies fever

## 2019-09-03 LAB — NOVEL CORONAVIRUS, NAA (HOSP ORDER, SEND-OUT TO REF LAB; TAT 18-24 HRS): SARS-CoV-2, NAA: NOT DETECTED

## 2019-09-08 ENCOUNTER — Telehealth: Payer: Self-pay | Admitting: *Deleted

## 2019-09-08 DIAGNOSIS — I10 Essential (primary) hypertension: Secondary | ICD-10-CM

## 2019-09-08 MED ORDER — LISINOPRIL 10 MG PO TABS: 10.0000 mg | ORAL_TABLET | Freq: Every day | ORAL | 1 refills | Status: DC

## 2019-09-08 MED ORDER — AMLODIPINE BESYLATE 10 MG PO TABS: 10.0000 mg | ORAL_TABLET | Freq: Every day | ORAL | 1 refills | Status: DC

## 2019-09-08 NOTE — Telephone Encounter (Signed)
Spoke with insurance and refill should go to [facility]. Refills sent

## 2019-09-11 ENCOUNTER — Ambulatory Visit: Payer: Medicare Other | Attending: Internal Medicine

## 2019-09-11 DIAGNOSIS — Z23 Encounter for immunization: Secondary | ICD-10-CM | POA: Insufficient documentation

## 2019-09-11 NOTE — Progress Notes (Signed)
   Covid-19 Vaccination Clinic  Name:  Taquanna Kinderknecht    MRN: [id number] DOB: August 01, 1927  09/11/2019  Ms. Cona was observed post Covid-19 immunization for 15 minutes without incidence. She was provided with Vaccine Information Sheet and instruction to access the V-Safe system.   Ms. Brain was instructed to call 911 with any severe reactions post vaccine: Marland Kitchen Difficulty breathing  . Swelling of your face and throat  . A fast heartbeat  . A bad rash all over your body  . Dizziness and weakness    Immunizations Administered    Name Date Dose VIS Date Route   Pfizer COVID-19 Vaccine 09/11/2019 12:21 PM 0.3 mL 06/24/2019 Intramuscular   Manufacturer: [place]   Lot: [id number]   [place]: [id number]

## 2019-09-20 ENCOUNTER — Other Ambulatory Visit: Payer: Self-pay

## 2019-09-20 DIAGNOSIS — E039 Hypothyroidism, unspecified: Secondary | ICD-10-CM

## 2019-09-20 MED ORDER — LEVOTHYROXINE SODIUM 75 MCG PO TABS: 75.0000 ug | ORAL_TABLET | Freq: Every day | ORAL | 2 refills | Status: DC

## 2019-10-11 ENCOUNTER — Ambulatory Visit: Payer: Medicare Other | Attending: Internal Medicine

## 2019-10-11 DIAGNOSIS — Z23 Encounter for immunization: Secondary | ICD-10-CM

## 2019-10-11 NOTE — Progress Notes (Signed)
   Covid-19 Vaccination Clinic  Name:  Janice Small    MRN: [id number] DOB: 1927-09-17  10/11/2019  [name] was observed post Covid-19 immunization for 15 minutes without incident. She was provided with Vaccine Information Sheet and instruction to access the V-Safe system.   [name] was instructed to call 911 with any severe reactions post vaccine: Marland Kitchen Difficulty breathing  . Swelling of face and throat  . A fast heartbeat  . A bad rash all over body  . Dizziness and weakness   Immunizations Administered    Name Date Dose VIS Date Route   Pfizer COVID-19 Vaccine 10/11/2019  9:48 AM 0.3 mL 06/24/2019 Intramuscular   Manufacturer: [place]   Lot: [id number]   [employer]: [id number]

## 2019-11-07 ENCOUNTER — Other Ambulatory Visit: Payer: Self-pay

## 2019-11-09 ENCOUNTER — Ambulatory Visit (INDEPENDENT_AMBULATORY_CARE_PROVIDER_SITE_OTHER): Payer: Medicare Other | Admitting: Internal Medicine

## 2019-11-09 ENCOUNTER — Encounter: Payer: Self-pay | Admitting: Internal Medicine

## 2019-11-09 ENCOUNTER — Other Ambulatory Visit: Payer: Self-pay

## 2019-11-09 VITALS — BP 142/70 | HR 78 | Ht 60.0 in | Wt 149.0 lb

## 2019-11-09 DIAGNOSIS — E039 Hypothyroidism, unspecified: Secondary | ICD-10-CM | POA: Diagnosis not present

## 2019-11-09 DIAGNOSIS — E210 Primary hyperparathyroidism: Secondary | ICD-10-CM | POA: Diagnosis not present

## 2019-11-09 DIAGNOSIS — E559 Vitamin D deficiency, unspecified: Secondary | ICD-10-CM | POA: Diagnosis not present

## 2019-11-09 DIAGNOSIS — M818 Other osteoporosis without current pathological fracture: Secondary | ICD-10-CM | POA: Diagnosis not present

## 2019-11-09 LAB — VITAMIN D 25 HYDROXY (VIT D DEFICIENCY, FRACTURES): VITD: 58.2 ng/mL (ref 30.00–100.00)

## 2019-11-09 LAB — BASIC METABOLIC PANEL
BUN: 29 mg/dL — ABNORMAL HIGH (ref 6–23)
CO2: 28 mEq/L (ref 19–32)
Calcium: 10.7 mg/dL — ABNORMAL HIGH (ref 8.4–10.5)
Chloride: 105 mEq/L (ref 96–112)
Creatinine, Ser: 0.88 mg/dL (ref 0.40–1.20)
GFR: 60.37 mL/min (ref >60.00)
Glucose, Bld: 105 mg/dL — ABNORMAL HIGH (ref 70–99)
Potassium: 4.1 mEq/L (ref 3.5–5.1)
Sodium: 140 mEq/L (ref 135–145)

## 2019-11-09 LAB — TSH: TSH: 1.65 u[IU]/mL (ref 0.35–4.50)

## 2019-11-09 LAB — T4, FREE: Free T4: 1.34 ng/dL (ref 0.60–1.60)

## 2019-11-09 NOTE — Progress Notes (Signed)
Patient ID: Janice Small, female   DOB: 03/01/1928, 86 y.o.   MRN: [id number]   This visit occurred during the SARS-CoV-2 public health emergency.  Safety protocols were in place, including screening questions prior to the visit, additional usage of staff PPE, and extensive cleaning of exam room while observing appropriate contact time as indicated for disinfecting solutions.   HPI  Janice Small is a 86 y.o.-year-old female, initially referred by her PCP, Dr. Jerilee Hoh, returning for follow-up for hypercalcemia/hyperparathyroidism, vitamin D insufficiency, and hypothyroidism.  Last visit 6 months ago.  Pt was has had hypercalcemia for many years, at least since 2008.  At the end of 2019 she was also found to have a high PTH was referred to endocrinology.  This decreased after improving her vitamin D level.  I reviewed her pertinent labs: Lab Results  Component Value Date   PTH 69 (H) 05/12/2019   PTH Comment 05/12/2019   PTH 32 12/03/2018   PTH 45 09/23/2018   PTH 92 (H) 06/14/2018   Lab Results  Component Value Date   CALCIUM 10.5 (H) 05/12/2019   CALCIUM 11.3 (H) 12/03/2018   CALCIUM 11.0 (H) 12/03/2018   CALCIUM 11.6 (H) 09/23/2018   CALCIUM 10.6 (H) 06/14/2018   CALCIUM 10.7 (H) 06/04/2018   CALCIUM 10.6 (H) 10/12/2017   CALCIUM 10.4 10/08/2016   CALCIUM 11.0 (H) 10/01/2015   CALCIUM 10.8 (H) 09/29/2014   CALCIUM 10.7 (H) 06/17/2013   CALCIUM 11.3 (H) 06/16/2012   CALCIUM 10.4 06/16/2011   CALCIUM 11.0 (H) 06/12/2010   CALCIUM 11.0 (H) 06/04/2009   CALCIUM 10.7 (H) 05/31/2008   CALCIUM 10.9 (H) 04/14/2007   Not on calcium supplements.  Reviewed previous investigations: Thyroid ultrasound (08/20/2018): 0.7 x 0.5 x 0.6 nodule at the left inferior thyroid pole, possibly a parathyroid adenoma  Technetium sestamibi parathyroid scan (08/20/2018): Suspicious for left inferior parathyroid adenoma  Patient prefers to avoid surgery.  She also has osteoporosis:  Reviewed the  most recent DXA scan report: 06/29/2018 Lumbar spine L1-L4 Femoral neck (FN) 33% distal radius  ultra distal radius  T-score  +1.1 RFN: -2.3 LFN: -1.9  -3.8  -4.5   She has a history of an ankle fracture-several years ago.  She is on:  - Prolia: 12/23/2017, 08/04/2019  She was going to [employer] - 3x a week - weight bearing exercises. Now stopped 2/2 coronavs pandemic.  She has a h/o scoliosis.  No history of kidney stones.  No history of CKD.  Latest BUN/Cr: Lab Results  Component Value Date   BUN 25 (H) 12/03/2018   BUN 28 (H) 09/23/2018   CREATININE 0.89 12/03/2018   CREATININE 0.90 (H) 09/23/2018   She is not on HCTZ.  She has a history of vitamin D insufficiency: Lab Results  Component Value Date   VD25OH 48.47 05/12/2019   VD25OH 32.98 12/03/2018   VD25OH 23.46 (L) 09/23/2018  She is on vitamin D 2000 units daily.  No family history of hypercalcemia, pituitary tumors, thyroid cancer, or osteoporosis.  Pt. also has a history of controlled hypothyroidism:  Pt is on levothyroxine 75 mcg daily, taken: - in am - fasting - at least 30 min from b'fast - no Ca, Fe, MVI, PPIs - not on Biotin  Reviewed her TSH levels: Lab Results  Component Value Date   TSH 1.47 12/03/2018   TSH 2.30 09/23/2018   TSH 2.15 10/12/2017   TSH 2.54 10/08/2016   TSH 1.93 10/01/2015   TSH 1.69 09/29/2014  TSH 2.46 06/17/2013   TSH 1.21 06/16/2012   TSH 2.09 06/16/2011   TSH 1.84 06/12/2010   TSH 1.82 06/04/2009   TSH 1.85 05/31/2008   TSH 2.11 04/14/2007   On B12, Magnesium 120 mg, Vit C.  She also has a history of HTN.  ROS: Constitutional: no weight gain/no weight loss, no fatigue, no subjective hyperthermia, no subjective hypothermia Eyes: no blurry vision, no xerophthalmia ENT: no sore throat, no nodules palpated in neck, no dysphagia, no odynophagia, no hoarseness Cardiovascular: no CP/no SOB/no palpitations/no leg swelling Respiratory: no cough/no SOB/no  wheezing Gastrointestinal: no N/no V/no D/no C/no acid reflux Musculoskeletal: + muscle aches/+ joint aches Skin: no rashes, no hair loss Neurological: no tremors/no numbness/no tingling/no dizziness  I reviewed pt's medications, allergies, PMH, social hx, family hx, and changes were documented in the history of present illness. Otherwise, unchanged from my initial visit note.  Past Medical History:  Diagnosis Date  . BREAST BIOPSY, HX OF 11/27/2006  . COLONIC POLYPS, HX OF 11/27/2006  . HEMORRHOIDS 01/17/2010  . HYPERTENSION 11/27/2006  . HYPOTHYROIDISM 11/27/2006  . MENOPAUSAL SYNDROME 11/27/2006   Past Surgical History:  Procedure Laterality Date  . ABDOMINAL HYSTERECTOMY    . APPENDECTOMY    . BREAST EXCISIONAL BIOPSY Right 1987  . BREAST SURGERY     bx  . TONSILLECTOMY     Social History   Socioeconomic History  . Marital status: Widowed -husband died 52    Spouse name: Not on file  . Number of children: 3  . Years of education: Not on file  . Highest education level: Not on file  Occupational History  .  Retired  Scientific laboratory technician  . Financial resource strain: Not on file  . Food insecurity:    Worry: Not on file    Inability: Not on file  . Transportation needs:    Medical: Not on file    Non-medical: Not on file  Tobacco Use  . Smoking status: Never Smoker  . Smokeless tobacco: Never Used  Substance and Sexual Activity  . Alcohol use:  No      . Drug use: No   Current Outpatient Medications on File Prior to Visit  Medication Sig Dispense Refill  . amLODipine (NORVASC) 10 MG tablet Take 1 tablet (10 mg total) by mouth daily. 90 tablet 1  . levothyroxine (SYNTHROID) 75 MCG tablet Take 1 tablet (75 mcg total) by mouth daily. 90 tablet 2  . lisinopril (ZESTRIL) 10 MG tablet Take 1 tablet (10 mg total) by mouth daily. 90 tablet 1  . metroNIDAZOLE (METROGEL) 0.75 % gel Apply 1 application topically at bedtime.     . Multiple Vitamin (MULTIVITAMIN) tablet Take 1  tablet by mouth daily.       No current facility-administered medications on file prior to visit.   Allergies  Allergen Reactions  . Codeine Phosphate     REACTION: unspecified   Family History  Problem Relation Age of Onset  . Heart disease Father   Also, diabetes and HTN and son.  PE: BP (!) 142/70   Pulse 78   Ht 5' (1.524 m)   Wt 149 lb (67.6 kg)   SpO2 98%   BMI 29.10 kg/m  Wt Readings from Last 3 Encounters:  11/09/19 149 lb (67.6 kg)  06/22/19 150 lb (68 kg)  05/12/19 151 lb 9.6 oz (68.8 kg)   Constitutional: normal weight, in NAD Eyes: PERRLA, EOMI, no exophthalmos ENT: moist mucous membranes, no thyromegaly, no  cervical lymphadenopathy Cardiovascular: RRR, No MRG, bilateral pitting edema up to knees Respiratory: CTA B Gastrointestinal: abdomen soft, NT, ND, BS+ Musculoskeletal: no deformities, strength intact in all 4 Skin: moist, warm, no rashes Neurological: no tremor with outstretched hands, DTR normal in all 4  Assessment: 1. Hypercalcemia/hyperparathyroidism  2. Hypothyroidism  3.  Vitamin D insufficiency  4.  Osteoporosis  Plan: Patient has had several instances of elevated calcium, with the highest level being at 11.6.  A corresponding intact PTH level was also high, and 92.  However, after normalizing her vitamin D level, this decreased to 32, which is still inappropriately high in the setting of a calcium level of 11.3. Most recent PTH and calcium level were slightly elevated at last visit in 04/2019. -As possible complications for hyperparathyroidism, she has osteoporosis, more pronounced at the level of the 33% distal radius and ultra distal radius and she also has a history of ankle fracture.  No abdominal pain, depression, bone pain, kidney stones.   -Previous imaging tests pointed towards a left inferior parathyroid adenoma -Her calcium has been elevated for at least 12 years and did not follow an upward trend.  Also, she does not have  nephrolithiasis.  She does have severe osteoporosis, for which we started Prolia, which can also help with decreasing her calcium.  Per her preference, we will continue to follow her without surgery - We will check her vitamin D and calcium today.   - I we will see the patient back in 8 months  2. Hypothyroidism - latest thyroid labs reviewed with pt >> normal: Lab Results  Component Value Date   TSH 1.47 12/03/2018   - she continues on LT4 75 mcg daily - pt feels good on this dose. - we discussed about taking the thyroid hormone every day, with water, >30 minutes before breakfast, separated by >4 hours from acid reflux medications, calcium, iron, multivitamins. Pt. is taking it correctly. - will check thyroid tests today: TSH and fT4 - If labs are abnormal, she will need to return for repeat TFTs in 1.5 months  3.  Vitamin D insufficiency -Patient had a low vitamin D level in 09/2018 after which we started 2000 units vitamin D daily -Latest vitamin D level was normal in 11/2018 -We will recheck her level today  4. OP -Likely postmenopausal/age-related + 2/2 hyperparathyroidism -Reviewed her bone density results from 2019: They show significant osteoporosis at the radius level.  We will try distal radius bone density was even lower.  This reflects the quality of her trabecular bone and this can be used as a surrogate marker for BMD of the spine. The spine appears to have a higher bone density most likely due to calcium accumulation in the setting of osteoarthritis. -We started Prolia in 12/2018 and she had 2 injections so far.   -She tolerates this well.  No thigh or hip pain, no jaw pain -Next injection is due in 0/2021 -We can continue with Prolia for up to 10 years -We discussed that Prolia can also result in improvement in her calcium levels -Next DEXA scan is due in 06/2020 -I will see the patient back around that time  Component     Latest Ref Rng & Units 11/09/2019           Sodium     135 - 145 mEq/L 140  Potassium     3.5 - 5.1 mEq/L 4.1  Chloride     96 - 112 mEq/L 105  CO2     19 - 32 mEq/L 28  Glucose     70 - 99 mg/dL 105 (H)  BUN     6 - 23 mg/dL 29 (H)  Creatinine     0.40 - 1.20 mg/dL 0.88  Calcium     8.4 - 10.5 mg/dL 10.7 (H)  GFR     >60.00 mL/min 60.37  TSH     0.35 - 4.50 uIU/mL 1.65  VITD     30.00 - 100.00 ng/mL 58.20  T4,Free(Direct)     0.60 - 1.60 ng/dL 1.34   Calcium only slightly high, vitamin D and thyroid tests normal.  Philemon Kingdom, MD PhD [facility] Endocrinology

## 2019-11-09 NOTE — Patient Instructions (Signed)
Please stop at the lab.  Continue vitamin D 2000 units daily.  Continue Levothyroxine 75 mcg daily.  Take the thyroid hormone every day, with water, at least 30 minutes before breakfast, separated by at least 4 hours from: - acid reflux medications - calcium - iron - multivitamins  Please come back for a follow-up appointment in 8 months.

## 2019-12-20 ENCOUNTER — Other Ambulatory Visit: Payer: Self-pay

## 2019-12-21 ENCOUNTER — Encounter: Payer: Self-pay | Admitting: Internal Medicine

## 2019-12-21 ENCOUNTER — Ambulatory Visit (INDEPENDENT_AMBULATORY_CARE_PROVIDER_SITE_OTHER): Payer: Medicare Other | Admitting: Internal Medicine

## 2019-12-21 VITALS — BP 140/80 | HR 71 | Temp 97.7°F | Ht 60.0 in | Wt 149.0 lb

## 2019-12-21 DIAGNOSIS — I10 Essential (primary) hypertension: Secondary | ICD-10-CM

## 2019-12-21 DIAGNOSIS — R600 Localized edema: Secondary | ICD-10-CM

## 2019-12-21 DIAGNOSIS — E559 Vitamin D deficiency, unspecified: Secondary | ICD-10-CM

## 2019-12-21 DIAGNOSIS — E210 Primary hyperparathyroidism: Secondary | ICD-10-CM | POA: Diagnosis not present

## 2019-12-21 DIAGNOSIS — E039 Hypothyroidism, unspecified: Secondary | ICD-10-CM

## 2019-12-21 DIAGNOSIS — M818 Other osteoporosis without current pathological fracture: Secondary | ICD-10-CM

## 2019-12-21 MED ORDER — LISINOPRIL 20 MG PO TABS: 20.0000 mg | ORAL_TABLET | Freq: Every day | ORAL | 1 refills | Status: DC

## 2019-12-21 NOTE — Patient Instructions (Addendum)
-  Nice seeing you today!!  -STOP using amlodipine.  -Increase lisinopril to 20 mg daily.  -Schedule 6 week follow up for your blood pressure and your leg swelling.

## 2019-12-21 NOTE — Progress Notes (Signed)
Established Patient Office Visit     This visit occurred during the SARS-CoV-2 public health emergency.  Safety protocols were in place, including screening questions prior to the visit, additional usage of staff PPE, and extensive cleaning of exam room while observing appropriate contact time as indicated for disinfecting solutions.    CC/Reason for Visit: 72-month follow-up chronic medical conditions  HPI: Janice Small is a 86 y.o. female who is coming in today for the above mentioned reasons. Past Medical History is significant for: Hypertension that has been well controlled in the past, hypothyroidism, osteoporosis, vitamin D deficiency as well as hypercalcemia due to hyperparathyroidism that is being followed by endocrinology.  She states that for a few months she has had significant swelling of her legs.  They are not painful.  She denies chest pain or shortness of breath.   Past Medical/Surgical History: Past Medical History:  Diagnosis Date  . BREAST BIOPSY, HX OF 11/27/2006  . COLONIC POLYPS, HX OF 11/27/2006  . HEMORRHOIDS 01/17/2010  . HYPERTENSION 11/27/2006  . HYPOTHYROIDISM 11/27/2006  . MENOPAUSAL SYNDROME 11/27/2006    Past Surgical History:  Procedure Laterality Date  . ABDOMINAL HYSTERECTOMY    . APPENDECTOMY    . BREAST EXCISIONAL BIOPSY Right 1987  . BREAST SURGERY     bx  . TONSILLECTOMY      Social History:  reports that she has never smoked. She has never used smokeless tobacco. She reports current alcohol use. She reports that she does not use drugs.  Allergies: Allergies  Allergen Reactions  . Codeine Phosphate     REACTION: unspecified    Family History:  Family History  Problem Relation Age of Onset  . Heart disease Father      Current Outpatient Medications:  .  levothyroxine (SYNTHROID) 75 MCG tablet, Take 1 tablet (75 mcg total) by mouth daily., Disp: 90 tablet, Rfl: 2 .  lisinopril (ZESTRIL) 20 MG tablet, Take 1 tablet (20 mg  total) by mouth daily., Disp: 90 tablet, Rfl: 1 .  metroNIDAZOLE (METROGEL) 0.75 % gel, Apply 1 application topically at bedtime. , Disp: , Rfl:   Review of Systems:  Constitutional: Denies fever, chills, diaphoresis, appetite change and fatigue.  HEENT: Denies photophobia, eye pain, redness, hearing loss, ear pain, congestion, sore throat, rhinorrhea, sneezing, mouth sores, trouble swallowing, neck pain, neck stiffness and tinnitus.   Respiratory: Denies SOB, DOE, cough, chest tightness,  and wheezing.   Cardiovascular: Denies chest pain, palpitations..  Gastrointestinal: Denies nausea, vomiting, abdominal pain, diarrhea, constipation, blood in stool and abdominal distention.  Genitourinary: Denies dysuria, urgency, frequency, hematuria, flank pain and difficulty urinating.  Endocrine: Denies: hot or cold intolerance, sweats, changes in hair or nails, polyuria, polydipsia. Musculoskeletal: Denies myalgias, back pain, joint swelling, arthralgias and gait problem.  Skin: Denies pallor, rash and wound.  Neurological: Denies dizziness, seizures, syncope, weakness, light-headedness, numbness and headaches.  Hematological: Denies adenopathy. Easy bruising, personal or family bleeding history  Psychiatric/Behavioral: Denies suicidal ideation, mood changes, confusion, nervousness, sleep disturbance and agitation    Physical Exam: Vitals:   12/21/19 0932  BP: 140/80  Pulse: 71  Temp: 97.7 F (36.5 C)  TempSrc: Other (Comment)  SpO2: 93%  Weight: 149 lb (67.6 kg)  Height: 5' (1.524 m)    Body mass index is 29.1 kg/m.   Constitutional: NAD, calm, comfortable Eyes: PERRL, lids and conjunctivae normal ENMT: Mucous membranes are moist. Respiratory: clear to auscultation bilaterally, no wheezing, no crackles. Normal respiratory effort. No  accessory muscle use.  Cardiovascular: Regular rate and rhythm, no murmurs / rubs / gallops.  2+ pitting lower extremity edema bilaterally. Neurologic:  Grossly intact and nonfocal Psychiatric: Normal judgment and insight. Alert and oriented x 3. Normal mood.    Impression and Plan:  Bilateral leg edema -Suspect might be related to her amlodipine, will discontinue and have her return in 6 weeks for follow-up.  Essential hypertension  -Since I am discontinuing her amlodipine due to her lower extremity edema and blood pressure is above goal today at 140/80, I will increase her lisinopril from 10 to 20 mg. -She has been advised to do ambulatory blood pressure monitoring and return in 6 weeks for follow-up.  Hypercalcemia Primary hyperparathyroidism ([place]) Other osteoporosis without current pathological fracture Vitamin D deficiency Acquired hypothyroidism -Followed by endocrinology.   Patient Instructions  -Nice seeing you today!!  -STOP using amlodipine.  -Increase lisinopril to 20 mg daily.  -Schedule 6 week follow up for your blood pressure and your leg swelling.     [name], MD [facility] at [facility]

## 2019-12-26 ENCOUNTER — Telehealth: Payer: Self-pay

## 2019-12-26 NOTE — Telephone Encounter (Signed)
Submitted patient to [employer] Assist portal to verify coverage for Prolia  

## 2020-01-09 NOTE — Telephone Encounter (Signed)
Received summary of benefits, and it indicates that the patient does not need a PA for Prolia. She has a $290 deductible, and this has been met. After deductible, patient is responsible for 20% of cost of medication and administration up to a $3290 out of pocket max ($563.59 met). Patient's financial responsibility is $59 unless pt qualifies for copay card through [place] since she has Pharmacist, community.

## 2020-01-13 NOTE — Telephone Encounter (Signed)
Called pt and left detailed voicemail informing pt that she is due for Prolia injection, and requested a call back to schedule this.

## 2020-01-17 NOTE — Telephone Encounter (Signed)
Pt returned phone call and she was given the information below. Pt then insisted that she has met her deductible. Pt was informed that I would contact insurance again to reverify this with them and then contact her again.  °Pt provided a phone number that she stated goes directly to the department that deals with her plan.  °This number is [phone] °

## 2020-01-23 NOTE — Telephone Encounter (Signed)
Will contact pt.  Also contacted pt's insurance company to verify whether deductible has been met or not. Pt has met deductible, and is responsible for 20% co-insurance for Prolia when billed through Part B benefits. No PA is required. Pt owes $275.00 at check in for Prolia medication.

## 2020-01-23 NOTE — Telephone Encounter (Signed)
Called pt and explained all of this information to her. Pt verbalized understanding. Pt was also schedule for 02/07/20 at 10am for Prolia injection.

## 2020-01-23 NOTE — Telephone Encounter (Signed)
Patient stated she wanted to speak to [facility] regarding her Prolia - requested to give her a call at [phone].

## 2020-01-23 NOTE — Telephone Encounter (Signed)
Reference number for call to insurance was [id number] and representatives name was [place].

## 2020-01-24 ENCOUNTER — Other Ambulatory Visit: Payer: Self-pay | Admitting: Internal Medicine

## 2020-01-24 DIAGNOSIS — I10 Essential (primary) hypertension: Secondary | ICD-10-CM

## 2020-02-03 ENCOUNTER — Encounter: Payer: Self-pay | Admitting: Internal Medicine

## 2020-02-03 ENCOUNTER — Other Ambulatory Visit: Payer: Self-pay

## 2020-02-03 ENCOUNTER — Ambulatory Visit: Payer: Medicare Other | Admitting: Internal Medicine

## 2020-02-03 VITALS — BP 190/90 | HR 84 | Temp 98.0°F | Wt 149.8 lb

## 2020-02-03 DIAGNOSIS — R600 Localized edema: Secondary | ICD-10-CM

## 2020-02-03 DIAGNOSIS — I10 Essential (primary) hypertension: Secondary | ICD-10-CM

## 2020-02-03 MED ORDER — LISINOPRIL 40 MG PO TABS: 40.0000 mg | ORAL_TABLET | Freq: Every day | ORAL | 1 refills | Status: DC

## 2020-02-03 NOTE — Progress Notes (Signed)
Established Patient Office Visit     This visit occurred during the SARS-CoV-2 public health emergency.  Safety protocols were in place, including screening questions prior to the visit, additional usage of staff PPE, and extensive cleaning of exam room while observing appropriate contact time as indicated for disinfecting solutions.    CC/Reason for Visit: Follow-up blood pressure and leg edema  HPI: Janice Small is a 86 y.o. female who is coming in today for the above mentioned reasons. She was seen in the office on 6/9 for bilateral leg edema. It was thought that amlodipine could be contributing so it was decided to discontinue it and instead place her on lisinopril 20 mg for blood pressure. She is here today for follow-up. Her edema has completely resolved. However her blood pressure remains elevated. 3 separate measurements in office today are 150/80, 200/90, 190/90. She denies any headache, chest pain, shortness of breath, vision disturbances, focal neurologic deficit.   Past Medical/Surgical History: Past Medical History:  Diagnosis Date  . BREAST BIOPSY, HX OF 11/27/2006  . COLONIC POLYPS, HX OF 11/27/2006  . HEMORRHOIDS 01/17/2010  . HYPERTENSION 11/27/2006  . HYPOTHYROIDISM 11/27/2006  . MENOPAUSAL SYNDROME 11/27/2006    Past Surgical History:  Procedure Laterality Date  . ABDOMINAL HYSTERECTOMY    . APPENDECTOMY    . BREAST EXCISIONAL BIOPSY Right 1987  . BREAST SURGERY     bx  . TONSILLECTOMY      Social History:  reports that she has never smoked. She has never used smokeless tobacco. She reports current alcohol use. She reports that she does not use drugs.  Allergies: Allergies  Allergen Reactions  . Codeine Phosphate     REACTION: unspecified    Family History:  Family History  Problem Relation Age of Onset  . Heart disease Father      Current Outpatient Medications:  .  levothyroxine (SYNTHROID) 75 MCG tablet, Take 1 tablet (75 mcg total) by  mouth daily., Disp: 90 tablet, Rfl: 2 .  lisinopril (ZESTRIL) 40 MG tablet, Take 1 tablet (40 mg total) by mouth daily., Disp: 90 tablet, Rfl: 1 .  metroNIDAZOLE (METROGEL) 0.75 % gel, Apply 1 application topically at bedtime. , Disp: , Rfl:   Review of Systems:  Constitutional: Denies fever, chills, diaphoresis, appetite change and fatigue.  HEENT: Denies photophobia, eye pain, redness, hearing loss, ear pain, congestion, sore throat, rhinorrhea, sneezing, mouth sores, trouble swallowing, neck pain, neck stiffness and tinnitus.   Respiratory: Denies SOB, DOE, cough, chest tightness,  and wheezing.   Cardiovascular: Denies chest pain, palpitations and leg swelling.  Gastrointestinal: Denies nausea, vomiting, abdominal pain, diarrhea, constipation, blood in stool and abdominal distention.  Genitourinary: Denies dysuria, urgency, frequency, hematuria, flank pain and difficulty urinating.  Endocrine: Denies: hot or cold intolerance, sweats, changes in hair or nails, polyuria, polydipsia. Musculoskeletal: Denies myalgias, back pain, joint swelling, arthralgias and gait problem.  Skin: Denies pallor, rash and wound.  Neurological: Denies dizziness, seizures, syncope, weakness, light-headedness, numbness and headaches.  Hematological: Denies adenopathy. Easy bruising, personal or family bleeding history  Psychiatric/Behavioral: Denies suicidal ideation, mood changes, confusion, nervousness, sleep disturbance and agitation    Physical Exam: Vitals:   02/03/20 0923 02/03/20 0951 02/03/20 0952  BP: (!) 150/80 (!) 200/90 (!) 190/90  Pulse: 84    Temp: 98 F (36.7 C)    TempSrc: Oral    SpO2: 97%    Weight: 149 lb 12.8 oz (67.9 kg)      Body  mass index is 29.26 kg/m.   Constitutional: NAD, calm, comfortable Eyes: PERRL, lids and conjunctivae normal, wears corrective lenses ENMT: Mucous membranes are moist.  Respiratory: clear to auscultation bilaterally, no wheezing, no crackles. Normal  respiratory effort. No accessory muscle use.  Cardiovascular: Regular rate and rhythm, no murmurs / rubs / gallops. No extremity edema. Neurologic: Grossly intact and nonfocal Psychiatric: Normal judgment and insight. Alert and oriented x 3. Normal mood.    Impression and Plan:  Essential hypertension  -Very elevated today. -Increase lisinopril from 20 to 40 mg. -She will do ambulatory blood pressure monitoring and return in 6 weeks for follow-up. -Consider basic metabolic profile at that time to follow renal function and electrolytes.  Bilateral leg edema -Resolved, suspect due to amlodipine.    Patient Instructions  -Nice seeing you today!!  -Increase lisinopril from 20 to 40 mg daily.  -Check blood pressure at home every day and bring your chart in to your next visit.  -Schedule follow up in 6 weeks.     [name], MD [facility] at [facility]

## 2020-02-03 NOTE — Patient Instructions (Signed)
-  Nice seeing you today!!  -Increase lisinopril from 20 to 40 mg daily.  -Check blood pressure at home every day and bring your chart in to your next visit.  -Schedule follow up in 6 weeks.

## 2020-02-07 ENCOUNTER — Ambulatory Visit: Payer: Medicare Other

## 2020-02-09 ENCOUNTER — Ambulatory Visit: Payer: Medicare Other

## 2020-02-09 ENCOUNTER — Other Ambulatory Visit: Payer: Self-pay

## 2020-02-09 DIAGNOSIS — M818 Other osteoporosis without current pathological fracture: Secondary | ICD-10-CM | POA: Diagnosis not present

## 2020-02-09 MED ORDER — DENOSUMAB 60 MG/ML ~~LOC~~ SOSY
60.0000 mg | PREFILLED_SYRINGE | Freq: Once | SUBCUTANEOUS | Status: AC
  Administered 2020-02-09: 60 mg via SUBCUTANEOUS

## 2020-02-09 NOTE — Progress Notes (Signed)
Per orders of Dr. Cruzita Lederer injection of Prolia 60mg  given today by N.Berdina Cheever,LPN. Patient tolerated injection well. This is being routed to Dr. Loanne Drilling, who is on call for cosigning in Dr. Arman Filter absence.

## 2020-02-10 ENCOUNTER — Ambulatory Visit: Payer: Medicare Other

## 2020-03-15 ENCOUNTER — Other Ambulatory Visit: Payer: Self-pay

## 2020-03-16 ENCOUNTER — Ambulatory Visit: Payer: Medicare Other | Admitting: Internal Medicine

## 2020-03-16 ENCOUNTER — Encounter: Payer: Self-pay | Admitting: Internal Medicine

## 2020-03-16 VITALS — BP 220/110 | HR 72 | Temp 98.1°F | Wt 146.2 lb

## 2020-03-16 DIAGNOSIS — I10 Essential (primary) hypertension: Secondary | ICD-10-CM | POA: Diagnosis not present

## 2020-03-16 DIAGNOSIS — E039 Hypothyroidism, unspecified: Secondary | ICD-10-CM

## 2020-03-16 MED ORDER — HYDROCHLOROTHIAZIDE 25 MG PO TABS: 25.0000 mg | ORAL_TABLET | Freq: Every day | ORAL | 0 refills | Status: DC

## 2020-03-16 MED ORDER — HYDROCHLOROTHIAZIDE 25 MG PO TABS: 25.0000 mg | ORAL_TABLET | Freq: Every day | ORAL | 1 refills | Status: DC

## 2020-03-16 NOTE — Patient Instructions (Signed)
-  Nice seeing you today!!  -Start HCTZ 25 mg daily.  -Check blood pressure at home every day.  -Come back in 2 weeks for a blood pressure check.  -If you develop Chest pain, shortness of breath, headache or blurry vision, please go to the ED.  -Continue low sodium diet.

## 2020-03-16 NOTE — Progress Notes (Signed)
Established Patient Office Visit     This visit occurred during the SARS-CoV-2 public health emergency.  Safety protocols were in place, including screening questions prior to the visit, additional usage of staff PPE, and extensive cleaning of exam room while observing appropriate contact time as indicated for disinfecting solutions.    CC/Reason for Visit: BP check  HPI: Janice Small is a 86 y.o. female who is coming in today for the above mentioned reasons. Past Medical History is significant for: HTN not well controlled and hypothyroidism. At last visit in July her lisinopril was increased from 20 to 40 mg. She has had no acute complaints. Has been eating low in sodium. No CP/SOB/dizziness/palpitations/blurry vision/focal neurologic deficits.   Past Medical/Surgical History: Past Medical History:  Diagnosis Date  . BREAST BIOPSY, HX OF 11/27/2006  . COLONIC POLYPS, HX OF 11/27/2006  . HEMORRHOIDS 01/17/2010  . HYPERTENSION 11/27/2006  . HYPOTHYROIDISM 11/27/2006  . MENOPAUSAL SYNDROME 11/27/2006    Past Surgical History:  Procedure Laterality Date  . ABDOMINAL HYSTERECTOMY    . APPENDECTOMY    . BREAST EXCISIONAL BIOPSY Right 1987  . BREAST SURGERY     bx  . TONSILLECTOMY      Social History:  reports that she has never smoked. She has never used smokeless tobacco. She reports current alcohol use. She reports that she does not use drugs.  Allergies: Allergies  Allergen Reactions  . Codeine Phosphate     REACTION: unspecified    Family History:  Family History  Problem Relation Age of Onset  . Heart disease Father      Current Outpatient Medications:  .  levothyroxine (SYNTHROID) 75 MCG tablet, Take 1 tablet (75 mcg total) by mouth daily., Disp: 90 tablet, Rfl: 2 .  lisinopril (ZESTRIL) 40 MG tablet, Take 1 tablet (40 mg total) by mouth daily., Disp: 90 tablet, Rfl: 1 .  metroNIDAZOLE (METROGEL) 0.75 % gel, Apply 1 application topically at bedtime. , Disp:  , Rfl:  .  hydrochlorothiazide (HYDRODIURIL) 25 MG tablet, Take 1 tablet (25 mg total) by mouth daily., Disp: 30 tablet, Rfl: 0 .  hydrochlorothiazide (HYDRODIURIL) 25 MG tablet, Take 1 tablet (25 mg total) by mouth daily., Disp: 90 tablet, Rfl: 1  Review of Systems:  Constitutional: Denies fever, chills, diaphoresis, appetite change and fatigue.  HEENT: Denies photophobia, eye pain, redness, hearing loss, ear pain, congestion, sore throat, rhinorrhea, sneezing, mouth sores, trouble swallowing, neck pain, neck stiffness and tinnitus.   Respiratory: Denies SOB, DOE, cough, chest tightness,  and wheezing.   Cardiovascular: Denies chest pain, palpitations and leg swelling.  Gastrointestinal: Denies nausea, vomiting, abdominal pain, diarrhea, constipation, blood in stool and abdominal distention.  Genitourinary: Denies dysuria, urgency, frequency, hematuria, flank pain and difficulty urinating.  Endocrine: Denies: hot or cold intolerance, sweats, changes in hair or nails, polyuria, polydipsia. Musculoskeletal: Denies myalgias, back pain, joint swelling, arthralgias and gait problem.  Skin: Denies pallor, rash and wound.  Neurological: Denies dizziness, seizures, syncope, weakness, light-headedness, numbness and headaches.  Hematological: Denies adenopathy. Easy bruising, personal or family bleeding history  Psychiatric/Behavioral: Denies suicidal ideation, mood changes, confusion, nervousness, sleep disturbance and agitation    Physical Exam: Vitals:   03/16/20 0935 03/16/20 0949  BP: (!) 210/100 (!) 220/110  Pulse: 72   Temp: 98.1 F (36.7 C)   TempSrc: Oral   SpO2: 99%   Weight: 146 lb 3.2 oz (66.3 kg)     Body mass index is 28.55 kg/m.  Constitutional: NAD, calm, comfortable Eyes: PERRL, lids and conjunctivae normal ENMT: Mucous membranes are moist.  Respiratory: clear to auscultation bilaterally, no wheezing, no crackles. Normal respiratory effort. No accessory muscle use.    Cardiovascular: Regular rate and rhythm, no murmurs / rubs / gallops. 1+ pitting lower extremity edema. 2+ pedal pulses. No carotid bruits.  Neurologic: grossly intact and non-focal Psychiatric: Normal judgment and insight. Alert and oriented x 3. Normal mood.    Impression and Plan:  Essential hypertension -Add HCTZ 25 mg to lisinopril 40 mg. -She will return in 2 weeks for follow up. -She is advised to go to ED over weekend if she has any concerning signs/symptoms.  Acquired hypothyroidism -Last TSH 1.650 in 4/21.    Patient Instructions  -Nice seeing you today!!  -Start HCTZ 25 mg daily.  -Check blood pressure at home every day.  -Come back in 2 weeks for a blood pressure check.  -If you develop Chest pain, shortness of breath, headache or blurry vision, please go to the ED.  -Continue low sodium diet.     [name], MD [facility] at [facility]

## 2020-03-28 ENCOUNTER — Other Ambulatory Visit: Payer: Self-pay

## 2020-03-29 ENCOUNTER — Encounter: Payer: Self-pay | Admitting: Internal Medicine

## 2020-03-29 ENCOUNTER — Ambulatory Visit: Payer: Medicare Other | Admitting: Internal Medicine

## 2020-03-29 VITALS — BP 200/90 | HR 80 | Temp 98.0°F | Ht 60.0 in | Wt 147.0 lb

## 2020-03-29 DIAGNOSIS — I10 Essential (primary) hypertension: Secondary | ICD-10-CM

## 2020-03-29 DIAGNOSIS — Z23 Encounter for immunization: Secondary | ICD-10-CM | POA: Diagnosis not present

## 2020-03-29 DIAGNOSIS — R253 Fasciculation: Secondary | ICD-10-CM | POA: Diagnosis not present

## 2020-03-29 MED ORDER — LISINOPRIL 40 MG PO TABS: 40.0000 mg | ORAL_TABLET | Freq: Every day | ORAL | 1 refills | Status: DC

## 2020-03-29 NOTE — Patient Instructions (Addendum)
-  Nice seeing you today!!  -Lab work today; will notify you once results are available.  -Flu vaccine today.  -Schedule follow up in 3 months.   

## 2020-03-29 NOTE — Addendum Note (Signed)
Addended by: Westley Hummer B on: 03/29/2020 03:49 PM   Modules accepted: Orders

## 2020-03-29 NOTE — Progress Notes (Signed)
Established Patient Office Visit     This visit occurred during the SARS-CoV-2 public health emergency.  Safety protocols were in place, including screening questions prior to the visit, additional usage of staff PPE, and extensive cleaning of exam room while observing appropriate contact time as indicated for disinfecting solutions.    CC/Reason for Visit: 2-week blood pressure follow-up  HPI: Janice Small is a 86 y.o. female who is coming in today for the above mentioned reasons.  She was seen in office 2 weeks ago and was noted to have a very elevated blood pressure of 210/110.  Hydrochlorothiazide was added to her maximum dose lisinopril she was asked to follow-up today.  Blood pressure in office is again elevated to around 210/90.  She brings in her home blood pressure monitor and on her machine blood pressure is 188/92.  She also brings in with her ambulatory measurements as follows:  120/53 142/67 134/66 120/47 126/58 115/56  She denies chest pain, shortness of breath, headache, focal neurologic deficit.  She has been having continued twitching under her left eye that is bothersome.   Past Medical/Surgical History: Past Medical History:  Diagnosis Date  . BREAST BIOPSY, HX OF 11/27/2006  . COLONIC POLYPS, HX OF 11/27/2006  . HEMORRHOIDS 01/17/2010  . HYPERTENSION 11/27/2006  . HYPOTHYROIDISM 11/27/2006  . MENOPAUSAL SYNDROME 11/27/2006    Past Surgical History:  Procedure Laterality Date  . ABDOMINAL HYSTERECTOMY    . APPENDECTOMY    . BREAST EXCISIONAL BIOPSY Right 1987  . BREAST SURGERY     bx  . TONSILLECTOMY      Social History:  reports that she has never smoked. She has never used smokeless tobacco. She reports current alcohol use. She reports that she does not use drugs.  Allergies: Allergies  Allergen Reactions  . Codeine Phosphate     REACTION: unspecified    Family History:  Family History  Problem Relation Age of Onset  . Heart disease  Father      Current Outpatient Medications:  .  hydrochlorothiazide (HYDRODIURIL) 25 MG tablet, Take 1 tablet (25 mg total) by mouth daily., Disp: 90 tablet, Rfl: 1 .  levothyroxine (SYNTHROID) 75 MCG tablet, Take 1 tablet (75 mcg total) by mouth daily., Disp: 90 tablet, Rfl: 2 .  lisinopril (ZESTRIL) 40 MG tablet, Take 1 tablet (40 mg total) by mouth daily., Disp: 90 tablet, Rfl: 1 .  metroNIDAZOLE (METROGEL) 0.75 % gel, Apply 1 application topically at bedtime. , Disp: , Rfl:   Review of Systems:  Constitutional: Denies fever, chills, diaphoresis, appetite change and fatigue.  HEENT: Denies photophobia, eye pain, redness, hearing loss, ear pain, congestion, sore throat, rhinorrhea, sneezing, mouth sores, trouble swallowing, neck pain, neck stiffness and tinnitus.   Respiratory: Denies SOB, DOE, cough, chest tightness,  and wheezing.   Cardiovascular: Denies chest pain, palpitations and leg swelling.  Gastrointestinal: Denies nausea, vomiting, abdominal pain, diarrhea, constipation, blood in stool and abdominal distention.  Genitourinary: Denies dysuria, urgency, frequency, hematuria, flank pain and difficulty urinating.  Endocrine: Denies: hot or cold intolerance, sweats, changes in hair or nails, polyuria, polydipsia. Musculoskeletal: Denies myalgias, back pain, joint swelling, arthralgias and gait problem.  Skin: Denies pallor, rash and wound.  Neurological: Denies dizziness, seizures, syncope, weakness, light-headedness, numbness and headaches.  Hematological: Denies adenopathy. Easy bruising, personal or family bleeding history  Psychiatric/Behavioral: Denies suicidal ideation, mood changes, confusion, nervousness, sleep disturbance and agitation    Physical Exam: Vitals:   03/29/20 4650  BP: (!) 200/90  Pulse: 80  Temp: 98 F (36.7 C)  SpO2: 94%  Weight: 147 lb (66.7 kg)  Height: 5' (1.524 m)    Body mass index is 28.71 kg/m.   Constitutional: NAD, calm,  comfortable Eyes: PERRL, lids and conjunctivae normal, wears corrective lenses, visible muscular fasciculations under her left eye. ENMT: Mucous membranes are moist.  Respiratory: clear to auscultation bilaterally, no wheezing, no crackles. Normal respiratory effort. No accessory muscle use.  Cardiovascular: Regular rate and rhythm, no murmurs / rubs / gallops. No extremity edema.   Neurologic: Grossly intact and nonfocal Psychiatric: Normal judgment and insight. Alert and oriented x 3. Normal mood.    Impression and Plan:  Essential hypertension -Suspect a significant element of whitecoat hypertension given normal blood pressures at home. -No medication changes for now, she will continue ambulatory measurements and return in 3 months for follow-up.  Need for influenza vaccination -Flu vaccine administered today  Muscle fasciculation  - Plan: Comprehensive metabolic panel, Magnesium to follow electrolytes.  Consider neurology evaluation if electrolytes are within normal limits.   Patient Instructions  -Nice seeing you today!!  -Lab work today; will notify you once results are available.  -Flu vaccine today.  -Schedule follow up in 3 months.     [name], MD [facility] at [facility]

## 2020-03-30 LAB — COMPREHENSIVE METABOLIC PANEL
AG Ratio: 2.2 (calc) (ref 1.0–2.5)
ALT: 11 U/L (ref 6–29)
AST: 7 U/L — ABNORMAL LOW (ref 10–35)
Albumin: 4.4 g/dL (ref 3.6–5.1)
Alkaline phosphatase (APISO): 46 U/L (ref 37–153)
BUN/Creatinine Ratio: 31 (calc) — ABNORMAL HIGH (ref 6–22)
BUN: 29 mg/dL — ABNORMAL HIGH (ref 7–25)
CO2: 31 mmol/L (ref 20–32)
Calcium: 10.8 mg/dL — ABNORMAL HIGH (ref 8.6–10.4)
Chloride: 103 mmol/L (ref 98–110)
Creat: 0.93 mg/dL — ABNORMAL HIGH (ref 0.60–0.88)
Globulin: 2 g/dL (calc) (ref 1.9–3.7)
Glucose, Bld: 107 mg/dL — ABNORMAL HIGH (ref 65–99)
Potassium: 3.5 mmol/L (ref 3.5–5.3)
Sodium: 141 mmol/L (ref 135–146)
Total Bilirubin: 0.6 mg/dL (ref 0.2–1.2)
Total Protein: 6.4 g/dL (ref 6.1–8.1)

## 2020-03-30 LAB — MAGNESIUM: Magnesium: 1.8 mg/dL (ref 1.5–2.5)

## 2020-04-03 ENCOUNTER — Other Ambulatory Visit: Payer: Self-pay

## 2020-04-03 ENCOUNTER — Telehealth (INDEPENDENT_AMBULATORY_CARE_PROVIDER_SITE_OTHER): Payer: Medicare Other | Admitting: Internal Medicine

## 2020-04-03 DIAGNOSIS — R253 Fasciculation: Secondary | ICD-10-CM | POA: Diagnosis not present

## 2020-04-03 NOTE — Progress Notes (Signed)
    Virtual Visit via Telephone Note  I connected with Janice Small on 04/03/20 at  4:00 PM EDT by telephone and verified that I am speaking with the correct person using two identifiers.   I discussed the limitations, risks, security and privacy concerns of performing an evaluation and management service by telephone and the availability of in person appointments. I also discussed with the patient that there may be a patient responsible charge related to this service. The patient expressed understanding and agreed to proceed.  Location patient: home Location provider: work office Participants present for the call: patient, provider Patient did not have a visit in the prior 7 days to address this/these issue(s).   History of Present Illness:  She has scheduled this phone call to discuss labs we had done last week in response to her muscle fasciculations under her left eye. All electrolytes are WNL. She still has these fasciculations on and off.   Observations/Objective: Patient sounds cheerful and well on the phone. I do not appreciate any increased work of breathing. Speech and thought processing are grossly intact. Patient reported vitals: none reported.   Current Outpatient Medications:  .  hydrochlorothiazide (HYDRODIURIL) 25 MG tablet, Take 1 tablet (25 mg total) by mouth daily., Disp: 90 tablet, Rfl: 1 .  levothyroxine (SYNTHROID) 75 MCG tablet, Take 1 tablet (75 mcg total) by mouth daily., Disp: 90 tablet, Rfl: 2 .  lisinopril (ZESTRIL) 40 MG tablet, Take 1 tablet (40 mg total) by mouth daily., Disp: 90 tablet, Rfl: 1 .  metroNIDAZOLE (METROGEL) 0.75 % gel, Apply 1 application topically at bedtime. , Disp: , Rfl:   Review of Systems:  Constitutional: Denies fever, chills, diaphoresis, appetite change and fatigue.  HEENT: Denies photophobia, eye pain, redness, hearing loss, ear pain, congestion, sore throat, rhinorrhea, sneezing, mouth sores, trouble swallowing, neck pain,  neck stiffness and tinnitus.   Respiratory: Denies SOB, DOE, cough, chest tightness,  and wheezing.   Cardiovascular: Denies chest pain, palpitations and leg swelling.  Gastrointestinal: Denies nausea, vomiting, abdominal pain, diarrhea, constipation, blood in stool and abdominal distention.  Genitourinary: Denies dysuria, urgency, frequency, hematuria, flank pain and difficulty urinating.  Endocrine: Denies: hot or cold intolerance, sweats, changes in hair or nails, polyuria, polydipsia. Musculoskeletal: Denies myalgias, back pain, joint swelling, arthralgias and gait problem.  Skin: Denies pallor, rash and wound.  Neurological: Denies dizziness, seizures, syncope, weakness, light-headedness, numbness and headaches.  Hematological: Denies adenopathy. Easy bruising, personal or family bleeding history  Psychiatric/Behavioral: Denies suicidal ideation, mood changes, confusion, nervousness, sleep disturbance and agitation   Assessment and Plan:  Muscle fasciculation -Advised electrolytes are WNL.Marland Kitchen -We have agreed on observation for now.   I discussed the assessment and treatment plan with the patient. The patient was provided an opportunity to ask questions and all were answered. The patient agreed with the plan and demonstrated an understanding of the instructions.   The patient was advised to call back or seek an in-person evaluation if the symptoms worsen or if the condition fails to improve as anticipated.  I provided 11 minutes of non-face-to-face time during this encounter.   [name], MD [facility]

## 2020-04-05 ENCOUNTER — Telehealth: Payer: Self-pay | Admitting: Internal Medicine

## 2020-04-05 DIAGNOSIS — R253 Fasciculation: Secondary | ICD-10-CM

## 2020-04-05 NOTE — Telephone Encounter (Signed)
Pt is asking for a call back so she can talk to the [facility] about what's going on

## 2020-04-05 NOTE — Telephone Encounter (Signed)
Spoke with patient and she would like to move forward with the referral to Neurology.  Referral placed.

## 2020-04-12 ENCOUNTER — Ambulatory Visit: Payer: Medicare Other | Admitting: Neurology

## 2020-04-26 NOTE — Progress Notes (Signed)
[facility] Note  04/30/2020     CHIEF COMPLAINT Patient presents for Retina Evaluation   HISTORY OF PRESENT ILLNESS: Janice Small is a 86 y.o. female who presents to the clinic today for:   HPI    Retina Evaluation    In left eye.  This started weeks ago.  Duration of weeks.  Associated Symptoms Floaters.  Context:  distance vision and near vision.  I, the attending physician,  performed the HPI with the patient and updated documentation appropriately.          Comments    Pt complains of new floaters in her left eye. She states she has not noticed a decrease in her vision in either eye.  Patient states she gets nervous when she sees the Dr and states her BP goes up as a result.  Pt was recently placed on new BP med (HCTZ).  Patient states BP was normal yesterday (~135/68).  Patient denies floaters OD and denies FOL OU.    Hx of CE/IOL OU Hx of Dry eye syndrome (Was supposed to begin Restasis but states was too expensive)       Last edited by Bernarda Caffey, MD on 04/30/2020  8:50 AM. (History)    pt is here on the referral of Dr. Jerline Pain for concern of BRVO OS, pt states she has not noticed a decrease in vision, she states her BP has been running high and she was recently put on HCTZ by her PCP  Referring physician: Shawnie Dapper, DO [address] [place],  [place] [address]  HISTORICAL INFORMATION:   Selected notes from the MEDICAL RECORD NUMBER Referred by Dr. Jerline Pain for eval of BRVO OS LEE: 10.12.2021 Ocular Hx- Glc suspect PMH-    CURRENT MEDICATIONS: No current outpatient medications on file. (Ophthalmic Drugs)   No current facility-administered medications for this visit. (Ophthalmic Drugs)   Current Outpatient Medications (Other)  Medication Sig  . hydrochlorothiazide (HYDRODIURIL) 25 MG tablet Take 1 tablet (25 mg total) by mouth daily.  Marland Kitchen levothyroxine (SYNTHROID) 75 MCG tablet Take 1 tablet (75 mcg total) by mouth daily.  Marland Kitchen  lisinopril (ZESTRIL) 40 MG tablet Take 1 tablet (40 mg total) by mouth daily.  . metroNIDAZOLE (METROGEL) 0.75 % gel Apply 1 application topically at bedtime.    No current facility-administered medications for this visit. (Other)      REVIEW OF SYSTEMS: ROS    Positive for: Endocrine, Cardiovascular, Eyes   Negative for: Constitutional, Gastrointestinal, Neurological, Skin, Genitourinary, Musculoskeletal, HENT, Respiratory, Psychiatric, Allergic/Imm, Heme/Lymph   Last edited by Doneen Poisson on 04/30/2020  8:39 AM. (History)       ALLERGIES Allergies  Allergen Reactions  . Codeine Phosphate     REACTION: unspecified    PAST MEDICAL HISTORY Past Medical History:  Diagnosis Date  . BREAST BIOPSY, HX OF 11/27/2006  . COLONIC POLYPS, HX OF 11/27/2006  . HEMORRHOIDS 01/17/2010  . HYPERTENSION 11/27/2006  . HYPOTHYROIDISM 11/27/2006  . MENOPAUSAL SYNDROME 11/27/2006   Past Surgical History:  Procedure Laterality Date  . ABDOMINAL HYSTERECTOMY    . APPENDECTOMY    . BREAST EXCISIONAL BIOPSY Right 1987  . BREAST SURGERY     bx  . TONSILLECTOMY      FAMILY HISTORY Family History  Problem Relation Age of Onset  . Heart disease Father     SOCIAL HISTORY Social History   Tobacco Use  . Smoking status: Never Smoker  . Smokeless  tobacco: Never Used  Substance Use Topics  . Alcohol use: Yes    Comment: rarely  . Drug use: No         OPHTHALMIC EXAM:  Base Eye Exam    Visual Acuity (Snellen - Linear)      Right Left   Dist cc 20/20 -2 20/40 +2   Dist ph cc  NI   Correction: Glasses       Tonometry (Tonopen, 8:35 AM)      Right Left   Pressure 17 16       Pupils      Dark Light Shape React APD   Right 3 2 Round Brisk 0   Left 3 2 Round Brisk 0       Visual Fields      Left Right    Full Full       Extraocular Movement      Right Left    Full Full       Neuro/Psych    Oriented x3: Yes   Mood/Affect: Normal       Dilation    Both eyes:  1.0% Mydriacyl, 2.5% Phenylephrine @ 8:35 AM        Slit Lamp and Fundus Exam    Slit Lamp Exam      Right Left   Lids/Lashes Dermatochalasis - upper lid Dermatochalasis - upper lid, mild Meibomian gland dysfunction   Conjunctiva/Sclera White and quiet White and quiet   Cornea 2-3+ inferior Punctate epithelial erosions, well healed temporal cataract wounds 1-2+ inferior Punctate epithelial erosions, well healed temporal cataract wounds, arcus   Anterior Chamber Deep and quiet Deep and quiet   Iris Round and dilated Round and dilated   Lens 3 piece PC IOL in good position with open PC 3 piece PC IOL in good position, 1+PCO (non-central)   Vitreous Vitreous syneresis Vitreous syneresis, Posterior vitreous detachment       Fundus Exam      Right Left   Disc Pink and Sharp mild Pallor, Sharp rim, temporal PPA   C/D Ratio 0.4 0.5   Macula Flat, Blunted foveal reflex, mild Drusen, RPE mottling and clumping Blunted foveal reflex, scattered IRH/DBH, focal edema, temporal macula, Drusen   Vessels attenuated, mild tortuousity, mild AV crossing changes attenuated, Tortuous, severe attenuation of vessels superior macula   Periphery Attached, No heme  Attached           Refraction    Wearing Rx      Sphere Cylinder Axis Add   Right -1.00 +0.50 027 +2.50   Left -0.75 +0.75 083 +2.50   Age: 18 yrs   Type: progressive       Manifest Refraction      Sphere Cylinder Axis Dist VA   Right -0.75 +0.50 035 20/20-1   Left -0.75 +0.75 085 20/30+2          IMAGING AND PROCEDURES  Imaging and Procedures for 04/30/2020  OCT, Retina - OU - Both Eyes       Right Eye Quality was good. Central Foveal Thickness: 270. Progression has no prior data. Findings include normal foveal contour, no IRF, no SRF, retinal drusen .   Left Eye Quality was good. Central Foveal Thickness: 285. Progression has no prior data. Findings include normal foveal contour, intraretinal fluid, intraretinal  hyper-reflective material, retinal drusen , no SRF (Superior IRF/CME greatest ST to fovea; focal ERM SN mac).   Notes *Images captured and stored on drive  Diagnosis /  Impression:  OD: non-exu ARMD  OS: Superior IRF/CME greatest ST to fovea; focal ERM SN mac  Clinical management:  See below  Abbreviations: NFP - Normal foveal profile. CME - cystoid macular edema. PED - pigment epithelial detachment. IRF - intraretinal fluid. SRF - subretinal fluid. EZ - ellipsoid zone. ERM - epiretinal membrane. ORA - outer retinal atrophy. ORT - outer retinal tubulation. SRHM - subretinal hyper-reflective material. IRHM - intraretinal hyper-reflective material        Intravitreal Injection, Pharmacologic Agent - OS - Left Eye       Time Out 04/30/2020. 8:58 AM. Confirmed correct patient, procedure, site, and patient consented.   Anesthesia Topical anesthesia was used. Anesthetic medications included Lidocaine 2%, Proparacaine 0.5%.   Procedure Preparation included 5% betadine to ocular surface, eyelid speculum. A (32g ) needle was used.   Injection:  1.25 mg Bevacizumab (AVASTIN) SOLN   NDC: 59935-701-77, Lot: [id number], Expiration date: 05/20/2020   Route: Intravitreal, Site: Left Eye, Waste: 0.05 mg  Post-op Post injection exam found visual acuity of at least counting fingers. The patient tolerated the procedure well. There were no complications. The patient received written and verbal post procedure care education. Post injection medications were not given.                 ASSESSMENT/PLAN:    ICD-10-CM   1. Branch retinal vein occlusion of left eye with macular edema  H34.8320 Intravitreal Injection, Pharmacologic Agent - OS - Left Eye    Bevacizumab (AVASTIN) SOLN 1.25 mg  2. Retinal edema  H35.81 OCT, Retina - OU - Both Eyes  3. Essential hypertension  I10   4. Hypertensive retinopathy of both eyes  H35.033   5. Early dry stage nonexudative age-related macular degeneration of  both eyes  H35.3131   6. Pseudophakia, both eyes  Z96.1   7. Glaucoma suspect of both eyes  H40.003     1,2. BRVO w/ CME OS - The natural history of retinal vein occlusion and macular edema and treatment options including observation, laser photocoagulation, and intravitreal antiVEGF injection with Avastin and Lucentis and Eylea and intravitreal injection of steroids with triamcinolone and Ozurdex and the complications of these procedures including loss of vision, infection, cataract, glaucoma, and retinal detachment were discussed with patient. - Specifically discussed findings from [place] / [place] study regarding patient stabilization with anti-VEGF agents and increased potential for visual improvements.  Also discussed need for frequent follow up and potentially multiple injections given the chronic nature of the disease process - BCVA 20/40+2 - OCT shows superior IRF/CME greatest ST to fovea - recommend IVA OS #1 today, 10.18.21 - pt wishes to proceed - RBA of procedure discussed, questions answered - Avastin informed consent obtained and signed, 10.18.21 (OS) - see procedure note - F/U 4 weeks -- DFE/OCT/possible injection  3,4. Hypertensive retinopathy OU - discussed importance of tight BP control - monitor  5. Age related macular degeneration, non-exudative, both eyes  - The incidence, anatomy, and pathology of dry AMD, risk of progression, and the AREDS and AREDS 2 study including smoking risks discussed with patient.  - Recommend amsler grid monitoring  - f/u 3 months, DFE, OCT  6. Pseudophakia OU  - s/p CE/IOL  - IOL in good position, doing well  - monitor  7. Glaucoma Suspect  - IOP 17,16  - under the expert care of Dr. Jerline Pain  Ophthalmic Meds Ordered this visit:  Meds ordered this encounter  Medications  . Bevacizumab (AVASTIN) SOLN  1.25 mg       Return in about 4 weeks (around 05/28/2020) for f/u BRVO OS, DFE, OCT.  There are no Patient Instructions on file  for this visit.  This document serves as a record of services personally performed by Gardiner Sleeper, MD, PhD. It was created on their behalf by Leeann Must, [place], an ophthalmic technician. The creation of this record is the provider's dictation and/or activities during the visit.    Electronically signed by: Leeann Must, COA _0 @ 9:44 AM  Gardiner Sleeper, M.D., Ph.D. Diseases & Surgery of the Retina and [place] 04/30/2020   I have reviewed the above documentation for accuracy and completeness, and I agree with the above. Gardiner Sleeper, M.D., Ph.D. 04/30/20 9:44 AM   Abbreviations: M myopia (nearsighted); A astigmatism; H hyperopia (farsighted); P presbyopia; Mrx spectacle prescription;  CTL contact lenses; OD right eye; OS left eye; OU both eyes  XT exotropia; ET esotropia; PEK punctate epithelial keratitis; PEE punctate epithelial erosions; DES dry eye syndrome; MGD meibomian gland dysfunction; ATs artificial tears; PFAT's preservative free artificial tears; [place] nuclear sclerotic cataract; PSC posterior subcapsular cataract; ERM epi-retinal membrane; PVD posterior vitreous detachment; RD retinal detachment; DM diabetes mellitus; DR diabetic retinopathy; NPDR non-proliferative diabetic retinopathy; PDR proliferative diabetic retinopathy; CSME clinically significant macular edema; DME diabetic macular edema; dbh dot blot hemorrhages; CWS cotton wool spot; POAG primary open angle glaucoma; C/D cup-to-disc ratio; HVF humphrey visual field; GVF goldmann visual field; OCT optical coherence tomography; IOP intraocular pressure; BRVO Branch retinal vein occlusion; CRVO central retinal vein occlusion; CRAO central retinal artery occlusion; BRAO branch retinal artery occlusion; RT retinal tear; SB scleral buckle; PPV pars plana vitrectomy; VH Vitreous hemorrhage; PRP panretinal laser photocoagulation; IVK intravitreal kenalog; VMT vitreomacular traction; MH  Macular hole;  NVD neovascularization of the disc; NVE neovascularization elsewhere; AREDS age related eye disease study; ARMD age related macular degeneration; POAG primary open angle glaucoma; EBMD epithelial/anterior basement membrane dystrophy; ACIOL anterior chamber intraocular lens; IOL intraocular lens; PCIOL posterior chamber intraocular lens; Phaco/IOL phacoemulsification with intraocular lens placement; [place] photorefractive keratectomy; LASIK laser assisted in situ keratomileusis; HTN hypertension; DM diabetes mellitus; COPD chronic obstructive pulmonary disease

## 2020-04-30 ENCOUNTER — Other Ambulatory Visit: Payer: Self-pay

## 2020-04-30 ENCOUNTER — Ambulatory Visit (INDEPENDENT_AMBULATORY_CARE_PROVIDER_SITE_OTHER): Payer: Medicare Other | Admitting: Ophthalmology

## 2020-04-30 ENCOUNTER — Encounter (INDEPENDENT_AMBULATORY_CARE_PROVIDER_SITE_OTHER): Payer: Self-pay | Admitting: Ophthalmology

## 2020-04-30 VITALS — BP 194/96 | HR 69

## 2020-04-30 DIAGNOSIS — H3581 Retinal edema: Secondary | ICD-10-CM

## 2020-04-30 DIAGNOSIS — H40003 Preglaucoma, unspecified, bilateral: Secondary | ICD-10-CM

## 2020-04-30 DIAGNOSIS — H353131 Nonexudative age-related macular degeneration, bilateral, early dry stage: Secondary | ICD-10-CM

## 2020-04-30 DIAGNOSIS — H34832 Tributary (branch) retinal vein occlusion, left eye, with macular edema: Secondary | ICD-10-CM

## 2020-04-30 DIAGNOSIS — H35033 Hypertensive retinopathy, bilateral: Secondary | ICD-10-CM

## 2020-04-30 DIAGNOSIS — Z961 Presence of intraocular lens: Secondary | ICD-10-CM

## 2020-04-30 DIAGNOSIS — I10 Essential (primary) hypertension: Secondary | ICD-10-CM

## 2020-04-30 MED ORDER — BEVACIZUMAB CHEMO INJECTION 1.25MG/0.05ML SYRINGE FOR KALEIDOSCOPE
1.2500 mg | INTRAVITREAL | Status: AC | PRN
  Administered 2020-04-30: 1.25 mg via INTRAVITREAL

## 2020-05-06 ENCOUNTER — Other Ambulatory Visit: Payer: Self-pay | Admitting: Internal Medicine

## 2020-05-06 DIAGNOSIS — E039 Hypothyroidism, unspecified: Secondary | ICD-10-CM

## 2020-05-24 NOTE — Progress Notes (Signed)
[facility] Note  05/28/2020     CHIEF COMPLAINT Patient presents for Retina Follow Up   HISTORY OF PRESENT ILLNESS: Janice Small is a 86 y.o. female who presents to the clinic today for:   HPI    Retina Follow Up    Patient presents with  CRVO/BRVO.  In left eye.  I, the attending physician,  performed the HPI with the patient and updated documentation appropriately.          Comments    4 week follow up BRVO OS-  Pt called Friday because she had fell and her glasses cut over her eye and below eyebrow.  She iced it over the weekend.  Seems better now.  She also has a stye that is being treated with Besivance and Doxycycline 37m po BID RUL.  Vision stable OU.         Last edited by ZBernarda Caffey MD on 05/28/2020 10:17 AM. (History)    pt states her eye got very red after the first injection, but other than that, she was okay, she could not tell a difference in her vision   Referring physician: HIsaac Bliss ERayford Halsted MD [address]  [place] [id number] HISTORICAL INFORMATION:   Selected notes from the MEDICAL RECORD NUMBER Referred by Dr. PJerline Painfor eval of BRVO OS LEE: 10.12.2021 Ocular Hx- Glc suspect PMH-    CURRENT MEDICATIONS: No current outpatient medications on file. (Ophthalmic Drugs)   No current facility-administered medications for this visit. (Ophthalmic Drugs)   Current Outpatient Medications (Other)  Medication Sig  . hydrochlorothiazide (HYDRODIURIL) 25 MG tablet Take 1 tablet (25 mg total) by mouth daily. (Patient not taking: Reported on 05/28/2020)  . lisinopril (ZESTRIL) 40 MG tablet Take 1 tablet (40 mg total) by mouth daily. (Patient not taking: Reported on 05/28/2020)  . metroNIDAZOLE (METROGEL) 0.75 % gel Apply 1 application topically at bedtime.  (Patient not taking: Reported on 05/28/2020)  . SYNTHROID 75 MCG tablet TAKE 1 TABLET BY MOUTH  DAILY (Patient not taking: Reported on 05/28/2020)    No current facility-administered medications for this visit. (Other)      REVIEW OF SYSTEMS: ROS    Positive for: Endocrine, Cardiovascular, Eyes   Negative for: Constitutional, Gastrointestinal, Neurological, Skin, Genitourinary, Musculoskeletal, HENT, Respiratory, Psychiatric, Allergic/Imm, Heme/Lymph   Last edited by HLeonie Douglas COA on 05/28/2020  9:37 AM. (History)       ALLERGIES Allergies  Allergen Reactions  . Codeine Phosphate     REACTION: unspecified    PAST MEDICAL HISTORY Past Medical History:  Diagnosis Date  . BREAST BIOPSY, HX OF 11/27/2006  . COLONIC POLYPS, HX OF 11/27/2006  . HEMORRHOIDS 01/17/2010  . HYPERTENSION 11/27/2006  . HYPOTHYROIDISM 11/27/2006  . MENOPAUSAL SYNDROME 11/27/2006   Past Surgical History:  Procedure Laterality Date  . ABDOMINAL HYSTERECTOMY    . APPENDECTOMY    . BREAST EXCISIONAL BIOPSY Right 1987  . BREAST SURGERY     bx  . TONSILLECTOMY      FAMILY HISTORY Family History  Problem Relation Age of Onset  . Heart disease Father     SOCIAL HISTORY Social History   Tobacco Use  . Smoking status: Never Smoker  . Smokeless tobacco: Never Used  Substance Use Topics  . Alcohol use: Yes    Comment: rarely  . Drug use: No         OPHTHALMIC EXAM:  Base Eye Exam  Visual Acuity (Snellen - Linear)      Right Left   Dist cc 20/20 -2 20/40 +2   Dist ph cc  20/30       Tonometry (Tonopen, 9:45 AM)      Right Left   Pressure 19 17       Pupils      Dark Light Shape React APD   Right 3 2 Round Brisk None   Left 3 2 Round Brisk None       Visual Fields (Counting fingers)      Left Right    Full Full       Neuro/Psych    Oriented x3: Yes   Mood/Affect: Normal       Dilation    Both eyes: 1.0% Mydriacyl, 2.5% Phenylephrine @ 9:45 AM        Slit Lamp and Fundus Exam    External Exam      Right Left   External  Ecchymosis lateral canthes        Slit Lamp Exam      Right Left   Lids/Lashes  Dermatochalasis - upper lid Dermatochalasis - upper lid, mild Meibomian gland dysfunction   Conjunctiva/Sclera White and quiet White and quiet   Cornea 2-3+ inferior Punctate epithelial erosions, well healed temporal cataract wounds 1-2+ inferior Punctate epithelial erosions, well healed temporal cataract wounds, arcus   Anterior Chamber Deep and quiet Deep and quiet   Iris Round and dilated Round and dilated   Lens 3 piece PC IOL in good position with open PC 3 piece PC IOL in good position, 1+PCO (non-central)   Vitreous Vitreous syneresis Vitreous syneresis, Posterior vitreous detachment       Fundus Exam      Right Left   Disc Pink and Sharp, Compact mild Pallor, Sharp rim, temporal PPA, Compact   C/D Ratio 0.3 0.5   Macula Flat, Blunted foveal reflex, mild Drusen, RPE mottling and clumping Blunted foveal reflex, scattered IRH/DBH -- improved, focal edema temporal macula -- improved, Drusen   Vessels attenuated, mild tortuousity, mild AV crossing changes attenuated, Tortuous, severe attenuation of vessels superior macula   Periphery Attached, No heme  Attached, mild reticular degeneration          IMAGING AND PROCEDURES  Imaging and Procedures for 05/28/2020  OCT, Retina - OU - Both Eyes       Right Eye Quality was good. Central Foveal Thickness: 272. Progression has been stable. Findings include normal foveal contour, no IRF, no SRF, retinal drusen .   Left Eye Quality was good. Central Foveal Thickness: 288. Progression has improved. Findings include normal foveal contour, intraretinal fluid, intraretinal hyper-reflective material, retinal drusen , no SRF (Mild interval improvement in Superior IRF/cystic changes; focal ERM SN mac).   Notes *Images captured and stored on drive  Diagnosis / Impression:  OD: non-exu ARMD  OS: Mild interval improvement in Superior IRF/cystic changes; focal ERM SN mac  Clinical management:  See below  Abbreviations: NFP - Normal foveal  profile. CME - cystoid macular edema. PED - pigment epithelial detachment. IRF - intraretinal fluid. SRF - subretinal fluid. EZ - ellipsoid zone. ERM - epiretinal membrane. ORA - outer retinal atrophy. ORT - outer retinal tubulation. SRHM - subretinal hyper-reflective material. IRHM - intraretinal hyper-reflective material        Intravitreal Injection, Pharmacologic Agent - OS - Left Eye       Time Out 05/28/2020. 9:51 AM. Confirmed correct patient, procedure, site, and patient  consented.   Anesthesia Topical anesthesia was used. Anesthetic medications included Lidocaine 2%, Proparacaine 0.5%.   Procedure Preparation included 5% betadine to ocular surface, eyelid speculum. A (32g ) needle was used.   Injection:  1.25 mg Bevacizumab (AVASTIN) SOLN   NDC: 54650-354-65, Lot: [id number] , Expiration date: 07/10/2020   Route: Intravitreal, Site: Left Eye, Waste: 0 mL  Post-op Post injection exam found visual acuity of at least counting fingers. The patient tolerated the procedure well. There were no complications. The patient received written and verbal post procedure care education. Post injection medications were not given.                 ASSESSMENT/PLAN:    ICD-10-CM   1. Branch retinal vein occlusion of left eye with macular edema  H34.8320 Intravitreal Injection, Pharmacologic Agent - OS - Left Eye    Bevacizumab (AVASTIN) SOLN 1.25 mg  2. Retinal edema  H35.81 OCT, Retina - OU - Both Eyes  3. Essential hypertension  I10   4. Hypertensive retinopathy of both eyes  H35.033   5. Early dry stage nonexudative age-related macular degeneration of both eyes  H35.3131   6. Pseudophakia, both eyes  Z96.1   7. Glaucoma suspect of both eyes  H40.003     1,2. BRVO w/ CME OS             - S/P IVA OS #1 (10.18.21) - BCVA 20/40+2 -- stable - OCT shows Mild interval improvement in Superior IRF/cystic changes - recommend IVA OS #2 today, 11.15.21 - pt wishes to proceed - RBA of  procedure discussed, questions answered - Avastin informed consent obtained and signed, 10.18.21 (OS) - see procedure note - F/U 4 weeks -- DFE/OCT/possible injection   3,4. Hypertensive retinopathy OU - discussed importance of tight BP control - monitor  5. Age related macular degeneration, non-exudative, both eyes  - The incidence, anatomy, and pathology of dry AMD, risk of progression, and the AREDS and AREDS 2 study including smoking risks discussed with patient.  - Recommend amsler grid monitoring  - f/u 3 months, DFE, OCT  6. Pseudophakia OU  - s/p CE/IOL  - IOL in good position, doing well  - monitor  7. Glaucoma Suspect  - IOP 19,17  - under the expert care of Dr. Jerline Pain  Ophthalmic Meds Ordered this visit:  Meds ordered this encounter  Medications  . Bevacizumab (AVASTIN) SOLN 1.25 mg       Return in about 4 weeks (around 06/25/2020) for f/u BRVO OS, DFE, OCT.  There are no Patient Instructions on file for this visit.  This document serves as a record of services personally performed by Gardiner Sleeper, MD, PhD. It was created on their behalf by Leeann Must, [place], an ophthalmic technician. The creation of this record is the provider's dictation and/or activities during the visit.    Electronically signed by: Leeann Must, [place] 11.11.2021 4:44 PM   This document serves as a record of services personally performed by Gardiner Sleeper, MD, PhD. It was created on their behalf by San Jetty. Owens Shark, OA an ophthalmic technician. The creation of this record is the provider's dictation and/or activities during the visit.    Electronically signed by: San Jetty. Owens Shark, [place] 11.15.2021 4:44 PM  Gardiner Sleeper, M.D., Ph.D. Diseases & Surgery of the Retina and [place] 05/28/2020   I have reviewed the above documentation for accuracy and completeness, and I agree with the above.  Gardiner Sleeper, M.D., Ph.D. 05/28/20 4:44  PM   Abbreviations: M myopia (nearsighted); A astigmatism; H hyperopia (farsighted); P presbyopia; Mrx spectacle prescription;  CTL contact lenses; OD right eye; OS left eye; OU both eyes  XT exotropia; ET esotropia; PEK punctate epithelial keratitis; PEE punctate epithelial erosions; DES dry eye syndrome; MGD meibomian gland dysfunction; ATs artificial tears; PFAT's preservative free artificial tears; [place] nuclear sclerotic cataract; PSC posterior subcapsular cataract; ERM epi-retinal membrane; PVD posterior vitreous detachment; RD retinal detachment; DM diabetes mellitus; DR diabetic retinopathy; NPDR non-proliferative diabetic retinopathy; PDR proliferative diabetic retinopathy; CSME clinically significant macular edema; DME diabetic macular edema; dbh dot blot hemorrhages; CWS cotton wool spot; POAG primary open angle glaucoma; C/D cup-to-disc ratio; HVF humphrey visual field; GVF goldmann visual field; OCT optical coherence tomography; IOP intraocular pressure; BRVO Branch retinal vein occlusion; CRVO central retinal vein occlusion; CRAO central retinal artery occlusion; BRAO branch retinal artery occlusion; RT retinal tear; SB scleral buckle; PPV pars plana vitrectomy; VH Vitreous hemorrhage; PRP panretinal laser photocoagulation; IVK intravitreal kenalog; VMT vitreomacular traction; MH Macular hole;  NVD neovascularization of the disc; NVE neovascularization elsewhere; AREDS age related eye disease study; ARMD age related macular degeneration; POAG primary open angle glaucoma; EBMD epithelial/anterior basement membrane dystrophy; ACIOL anterior chamber intraocular lens; IOL intraocular lens; PCIOL posterior chamber intraocular lens; Phaco/IOL phacoemulsification with intraocular lens placement; [place] photorefractive keratectomy; LASIK laser assisted in situ keratomileusis; HTN hypertension; DM diabetes mellitus; COPD chronic obstructive pulmonary disease

## 2020-05-28 ENCOUNTER — Other Ambulatory Visit: Payer: Self-pay

## 2020-05-28 ENCOUNTER — Encounter (INDEPENDENT_AMBULATORY_CARE_PROVIDER_SITE_OTHER): Payer: Self-pay | Admitting: Ophthalmology

## 2020-05-28 ENCOUNTER — Ambulatory Visit (INDEPENDENT_AMBULATORY_CARE_PROVIDER_SITE_OTHER): Payer: Medicare Other | Admitting: Ophthalmology

## 2020-05-28 DIAGNOSIS — H34832 Tributary (branch) retinal vein occlusion, left eye, with macular edema: Secondary | ICD-10-CM | POA: Diagnosis not present

## 2020-05-28 DIAGNOSIS — H3581 Retinal edema: Secondary | ICD-10-CM

## 2020-05-28 DIAGNOSIS — H35033 Hypertensive retinopathy, bilateral: Secondary | ICD-10-CM | POA: Diagnosis not present

## 2020-05-28 DIAGNOSIS — I10 Essential (primary) hypertension: Secondary | ICD-10-CM

## 2020-05-28 DIAGNOSIS — H353131 Nonexudative age-related macular degeneration, bilateral, early dry stage: Secondary | ICD-10-CM

## 2020-05-28 DIAGNOSIS — Z961 Presence of intraocular lens: Secondary | ICD-10-CM

## 2020-05-28 DIAGNOSIS — H40003 Preglaucoma, unspecified, bilateral: Secondary | ICD-10-CM

## 2020-05-28 MED ORDER — BEVACIZUMAB CHEMO INJECTION 1.25MG/0.05ML SYRINGE FOR KALEIDOSCOPE
1.2500 mg | INTRAVITREAL | Status: AC | PRN
  Administered 2020-05-28: 1.25 mg via INTRAVITREAL

## 2020-06-14 ENCOUNTER — Ambulatory Visit: Payer: Medicare Other | Admitting: Neurology

## 2020-06-16 ENCOUNTER — Other Ambulatory Visit: Payer: Self-pay | Admitting: Internal Medicine

## 2020-06-16 DIAGNOSIS — I10 Essential (primary) hypertension: Secondary | ICD-10-CM

## 2020-06-19 ENCOUNTER — Telehealth: Payer: Self-pay | Admitting: Internal Medicine

## 2020-06-19 NOTE — Telephone Encounter (Signed)
Patient called to confirm when she can get her Prolia shot and to schedule appointment. Please call patient at [phone]

## 2020-06-19 NOTE — Telephone Encounter (Signed)
Can we please advise on this?   Thank you!

## 2020-06-21 NOTE — Progress Notes (Signed)
[facility] Note  06/25/2020     CHIEF COMPLAINT Patient presents for Retina Follow Up   HISTORY OF PRESENT ILLNESS: Janice Small is a 86 y.o. female who presents to the clinic today for:   HPI    Retina Follow Up    Patient presents with  CRVO/BRVO.  In left eye.  This started weeks ago.  Severity is moderate.  Duration of weeks.  Since onset it is stable.  I, the attending physician,  performed the HPI with the patient and updated documentation appropriately.          Comments    Pt states vision is about the same OU.  Pt denies eye pain or discomfort and denies any new or worsening floaters or fol OU.       Last edited by Bernarda Caffey, MD on 06/25/2020  9:46 AM. (History)    pt states vision is stable, no problems with injections   Referring physician: Isaac Bliss, Rayford Halsted, MD [place],  [place] [address]  HISTORICAL INFORMATION:   Selected notes from the MEDICAL RECORD NUMBER Referred by Dr. Jerline Pain for eval of BRVO OS LEE: 10.12.2021 Ocular Hx- Glc suspect PMH-    CURRENT MEDICATIONS: No current outpatient medications on file. (Ophthalmic Drugs)   No current facility-administered medications for this visit. (Ophthalmic Drugs)   Current Outpatient Medications (Other)  Medication Sig  . hydrochlorothiazide (HYDRODIURIL) 25 MG tablet Take 1 tablet (25 mg total) by mouth daily. (Patient not taking: Reported on 05/28/2020)  . lisinopril (ZESTRIL) 40 MG tablet TAKE 1 TABLET BY MOUTH  DAILY  . metroNIDAZOLE (METROGEL) 0.75 % gel Apply 1 application topically at bedtime.  (Patient not taking: Reported on 05/28/2020)  . SYNTHROID 75 MCG tablet TAKE 1 TABLET BY MOUTH  DAILY (Patient not taking: Reported on 05/28/2020)   No current facility-administered medications for this visit. (Other)      REVIEW OF SYSTEMS: ROS    Positive for: Endocrine, Cardiovascular, Eyes   Negative for: Constitutional,  Gastrointestinal, Neurological, Skin, Genitourinary, Musculoskeletal, HENT, Respiratory, Psychiatric, Allergic/Imm, Heme/Lymph   Last edited by Doneen Poisson on 06/25/2020  9:02 AM. (History)       ALLERGIES Allergies  Allergen Reactions  . Codeine Phosphate     REACTION: unspecified    PAST MEDICAL HISTORY Past Medical History:  Diagnosis Date  . BREAST BIOPSY, HX OF 11/27/2006  . COLONIC POLYPS, HX OF 11/27/2006  . HEMORRHOIDS 01/17/2010  . HYPERTENSION 11/27/2006  . HYPOTHYROIDISM 11/27/2006  . MENOPAUSAL SYNDROME 11/27/2006   Past Surgical History:  Procedure Laterality Date  . ABDOMINAL HYSTERECTOMY    . APPENDECTOMY    . BREAST EXCISIONAL BIOPSY Right 1987  . BREAST SURGERY     bx  . TONSILLECTOMY      FAMILY HISTORY Family History  Problem Relation Age of Onset  . Heart disease Father     SOCIAL HISTORY Social History   Tobacco Use  . Smoking status: Never Smoker  . Smokeless tobacco: Never Used  Substance Use Topics  . Alcohol use: Yes    Comment: rarely  . Drug use: No         OPHTHALMIC EXAM:  Base Eye Exam    Visual Acuity (Snellen - Linear)      Right Left   Dist cc 20/20 -2 20/30 -2   Dist ph cc  NI   Correction: Glasses       Tonometry (Tonopen, 9:06  AM)      Right Left   Pressure 16 14       Pupils      Dark Light Shape React APD   Right 3 2 Round Brisk 0   Left 3 2 Round Brisk 0       Visual Fields      Left Right    Full Full       Extraocular Movement      Right Left    Full Full       Neuro/Psych    Oriented x3: Yes   Mood/Affect: Normal       Dilation    Both eyes: 1.0% Mydriacyl, 2.5% Phenylephrine @ 9:06 AM        Slit Lamp and Fundus Exam    External Exam      Right Left   External  Ecchymosis lateral canthes        Slit Lamp Exam      Right Left   Lids/Lashes Dermatochalasis - upper lid Dermatochalasis - upper lid, mild Meibomian gland dysfunction   Conjunctiva/Sclera White and quiet White and  quiet   Cornea 2-3+ inferior Punctate epithelial erosions, well healed temporal cataract wounds 1-2+ inferior Punctate epithelial erosions, well healed temporal cataract wounds, arcus   Anterior Chamber Deep and quiet Deep and quiet   Iris Round and dilated Round and dilated   Lens 3 piece PC IOL in good position with open PC 3 piece PC IOL in good position, 1+PCO (non-central)   Vitreous Vitreous syneresis Vitreous syneresis, Posterior vitreous detachment       Fundus Exam      Right Left   Disc Pink and Sharp, Compact mild Pallor, Sharp rim, temporal PPA, Compact   C/D Ratio 0.3 0.5   Macula Flat, Blunted foveal reflex, mild Drusen, RPE mottling and clumping Blunted foveal reflex, scattered IRH/DBH -- improved, focal edema temporal macula -- improved, Drusen   Vessels attenuated, Tortuous attenuated, Tortuous, severe attenuation of vessels superior macula   Periphery Attached, No heme  Attached, mild reticular degeneration        Refraction    Wearing Rx      Sphere Cylinder Axis Add   Right -1.00 +0.50 027 +2.50   Left -0.75 +0.75 083 +2.50   Type: progressive          IMAGING AND PROCEDURES  Imaging and Procedures for 06/25/2020  OCT, Retina - OU - Both Eyes       Right Eye Quality was good. Central Foveal Thickness: 275. Progression has been stable. Findings include normal foveal contour, no IRF, no SRF, retinal drusen .   Left Eye Quality was good. Central Foveal Thickness: 287. Progression has improved. Findings include normal foveal contour, intraretinal fluid, intraretinal hyper-reflective material, retinal drusen , no SRF (Mild interval improvement in IRF temporal macula).   Notes *Images captured and stored on drive  Diagnosis / Impression:  OD: non-exu ARMD  OS: Mild interval improvement in IRF temporal macula  Clinical management:  See below  Abbreviations: NFP - Normal foveal profile. CME - cystoid macular edema. PED - pigment epithelial detachment.  IRF - intraretinal fluid. SRF - subretinal fluid. EZ - ellipsoid zone. ERM - epiretinal membrane. ORA - outer retinal atrophy. ORT - outer retinal tubulation. SRHM - subretinal hyper-reflective material. IRHM - intraretinal hyper-reflective material        Intravitreal Injection, Pharmacologic Agent - OS - Left Eye       Time Out 06/25/2020.  9:20 AM. Confirmed correct patient, procedure, site, and patient consented.   Anesthesia Topical anesthesia was used. Anesthetic medications included Lidocaine 2%, Proparacaine 0.5%.   Procedure Preparation included 5% betadine to ocular surface, eyelid speculum. A (32g ) needle was used.   Injection:  1.25 mg Bevacizumab (AVASTIN) 1.25mg /0.68mL SOLN   NDC: 69485-462-70, Lot: [id number], Expiration date: 07/05/2020   Route: Intravitreal, Site: Left Eye, Waste: 0.05 mL  Post-op Post injection exam found visual acuity of at least counting fingers. The patient tolerated the procedure well. There were no complications. The patient received written and verbal post procedure care education. Post injection medications were not given.                 ASSESSMENT/PLAN:    ICD-10-CM   1. Branch retinal vein occlusion of left eye with macular edema  H34.8320 Intravitreal Injection, Pharmacologic Agent - OS - Left Eye    Bevacizumab (AVASTIN) SOLN 1.25 mg  2. Essential hypertension  I10   3. Retinal edema  H35.81 OCT, Retina - OU - Both Eyes  4. Hypertensive retinopathy of both eyes  H35.033   5. Early dry stage nonexudative age-related macular degeneration of both eyes  H35.3131   6. Pseudophakia, both eyes  Z96.1   7. Glaucoma suspect of both eyes  H40.003     1,2. BRVO w/ CME OS             - S/P IVA OS #1 (10.18.21), #2 (11.15.21) - BCVA 20/30-2 -- stable - OCT shows Mild interval improvement in IRF temporal macula - recommend IVA OS #3 today, 12.13.21 - pt wishes to proceed - RBA of procedure discussed, questions answered - Avastin  informed consent obtained and signed, 10.18.21 (OS) - see procedure note - F/U 4 weeks -- DFE/OCT/possible injection   3,4. Hypertensive retinopathy OU - discussed importance of tight BP control - monitor  5. Age related macular degeneration, non-exudative, both eyes  - The incidence, anatomy, and pathology of dry AMD, risk of progression, and the AREDS and AREDS 2 study including smoking risks discussed with patient.  - Recommend amsler grid monitoring  - f/u 3 months, DFE, OCT  6. Pseudophakia OU  - s/p CE/IOL  - IOL in good position, doing well  - monitor  7. Glaucoma Suspect  - IOP 16,14  - under the expert care of Dr. Jerline Pain  Ophthalmic Meds Ordered this visit:  Meds ordered this encounter  Medications  . Bevacizumab (AVASTIN) SOLN 1.25 mg       Return in about 4 weeks (around 07/23/2020) for f/u BRVO OS, DFE, OCT.  There are no Patient Instructions on file for this visit.  This document serves as a record of services personally performed by Gardiner Sleeper, MD, PhD. It was created on their behalf by Leeann Must, [place], an ophthalmic technician. The creation of this record is the provider's dictation and/or activities during the visit.    Electronically signed by: Leeann Must, COA 12.09.2021 9:49 AM   This document serves as a record of services personally performed by Gardiner Sleeper, MD, PhD. It was created on their behalf by San Jetty. Owens Shark, OA an ophthalmic technician. The creation of this record is the provider's dictation and/or activities during the visit.    Electronically signed by: San Jetty. Owens Shark, [place] 12.13.2021 9:49 AM  Gardiner Sleeper, M.D., Ph.D. Diseases & Surgery of the Retina and Vitreous [facility] 06/25/2020   I have reviewed the  above documentation for accuracy and completeness, and I agree with the above. Gardiner Sleeper, M.D., Ph.D. 06/25/20 9:49 AM   Abbreviations: M myopia (nearsighted); A astigmatism; H  hyperopia (farsighted); P presbyopia; Mrx spectacle prescription;  CTL contact lenses; OD right eye; OS left eye; OU both eyes  XT exotropia; ET esotropia; PEK punctate epithelial keratitis; PEE punctate epithelial erosions; DES dry eye syndrome; MGD meibomian gland dysfunction; ATs artificial tears; PFAT's preservative free artificial tears; [place] nuclear sclerotic cataract; PSC posterior subcapsular cataract; ERM epi-retinal membrane; PVD posterior vitreous detachment; RD retinal detachment; DM diabetes mellitus; DR diabetic retinopathy; NPDR non-proliferative diabetic retinopathy; PDR proliferative diabetic retinopathy; CSME clinically significant macular edema; DME diabetic macular edema; dbh dot blot hemorrhages; CWS cotton wool spot; POAG primary open angle glaucoma; C/D cup-to-disc ratio; HVF humphrey visual field; GVF goldmann visual field; OCT optical coherence tomography; IOP intraocular pressure; BRVO Branch retinal vein occlusion; CRVO central retinal vein occlusion; CRAO central retinal artery occlusion; BRAO branch retinal artery occlusion; RT retinal tear; SB scleral buckle; PPV pars plana vitrectomy; VH Vitreous hemorrhage; PRP panretinal laser photocoagulation; IVK intravitreal kenalog; VMT vitreomacular traction; MH Macular hole;  NVD neovascularization of the disc; NVE neovascularization elsewhere; AREDS age related eye disease study; ARMD age related macular degeneration; POAG primary open angle glaucoma; EBMD epithelial/anterior basement membrane dystrophy; ACIOL anterior chamber intraocular lens; IOL intraocular lens; PCIOL posterior chamber intraocular lens; Phaco/IOL phacoemulsification with intraocular lens placement; [place] photorefractive keratectomy; LASIK laser assisted in situ keratomileusis; HTN hypertension; DM diabetes mellitus; COPD chronic obstructive pulmonary disease

## 2020-06-21 NOTE — Telephone Encounter (Signed)
Pt is not due until Feb

## 2020-06-25 ENCOUNTER — Other Ambulatory Visit: Payer: Self-pay

## 2020-06-25 ENCOUNTER — Encounter (INDEPENDENT_AMBULATORY_CARE_PROVIDER_SITE_OTHER): Payer: Self-pay | Admitting: Ophthalmology

## 2020-06-25 ENCOUNTER — Ambulatory Visit (INDEPENDENT_AMBULATORY_CARE_PROVIDER_SITE_OTHER): Payer: Medicare Other | Admitting: Ophthalmology

## 2020-06-25 DIAGNOSIS — H34832 Tributary (branch) retinal vein occlusion, left eye, with macular edema: Secondary | ICD-10-CM

## 2020-06-25 DIAGNOSIS — H40003 Preglaucoma, unspecified, bilateral: Secondary | ICD-10-CM

## 2020-06-25 DIAGNOSIS — H3581 Retinal edema: Secondary | ICD-10-CM | POA: Diagnosis not present

## 2020-06-25 DIAGNOSIS — I10 Essential (primary) hypertension: Secondary | ICD-10-CM | POA: Diagnosis not present

## 2020-06-25 DIAGNOSIS — Z961 Presence of intraocular lens: Secondary | ICD-10-CM

## 2020-06-25 DIAGNOSIS — H353131 Nonexudative age-related macular degeneration, bilateral, early dry stage: Secondary | ICD-10-CM

## 2020-06-25 DIAGNOSIS — H35033 Hypertensive retinopathy, bilateral: Secondary | ICD-10-CM | POA: Diagnosis not present

## 2020-06-25 MED ORDER — BEVACIZUMAB CHEMO INJECTION 1.25MG/0.05ML SYRINGE FOR KALEIDOSCOPE
1.2500 mg | INTRAVITREAL | Status: AC | PRN
  Administered 2020-06-25: 1.25 mg via INTRAVITREAL

## 2020-06-28 ENCOUNTER — Ambulatory Visit: Payer: Medicare Other | Admitting: Internal Medicine

## 2020-07-10 ENCOUNTER — Ambulatory Visit (INDEPENDENT_AMBULATORY_CARE_PROVIDER_SITE_OTHER): Payer: Medicare Other | Admitting: Internal Medicine

## 2020-07-10 ENCOUNTER — Other Ambulatory Visit: Payer: Self-pay

## 2020-07-10 ENCOUNTER — Encounter: Payer: Self-pay | Admitting: Internal Medicine

## 2020-07-10 VITALS — BP 160/110 | HR 73 | Ht 60.0 in | Wt 149.4 lb

## 2020-07-10 DIAGNOSIS — E039 Hypothyroidism, unspecified: Secondary | ICD-10-CM | POA: Diagnosis not present

## 2020-07-10 DIAGNOSIS — M818 Other osteoporosis without current pathological fracture: Secondary | ICD-10-CM

## 2020-07-10 DIAGNOSIS — E559 Vitamin D deficiency, unspecified: Secondary | ICD-10-CM

## 2020-07-10 DIAGNOSIS — E210 Primary hyperparathyroidism: Secondary | ICD-10-CM | POA: Diagnosis not present

## 2020-07-10 LAB — T4, FREE: Free T4: 1.15 ng/dL (ref 0.60–1.60)

## 2020-07-10 LAB — TSH: TSH: 2.56 u[IU]/mL (ref 0.35–4.50)

## 2020-07-10 NOTE — Patient Instructions (Addendum)
Please stop at the lab. ° °Continue vitamin D 2000 units daily. ° °Continue Levothyroxine 75 mcg daily. ° °Take the thyroid hormone every day, with water, at least 30 minutes before breakfast, separated by at least 4 hours from: °- acid reflux medications °- calcium °- iron °- multivitamins ° °Please call and schedule bone density scan at the [facility]: [phone]. ° °Please come back for a follow-up appointment in 1 year ° °

## 2020-07-10 NOTE — Progress Notes (Addendum)
Patient ID: Janice Small, female   DOB: 01-Sep-1927, 86 y.o.   MRN: [id number]   This visit occurred during the SARS-CoV-2 public health emergency.  Safety protocols were in place, including screening questions prior to the visit, additional usage of staff PPE, and extensive cleaning of exam room while observing appropriate contact time as indicated for disinfecting solutions.   HPI  Janice Small is a 86 y.o.-year-old female, initially referred by her PCP, Dr. Ardyth Harps, returning for follow-up for hypercalcemia/hyperparathyroidism, vitamin D insufficiency, and hypothyroidism.  Last visit 8 months ago.  Since last OV, she fell on the steps leading to her kitchen >> no fractures.  Hypercalcemia  -For many years, at least since 2008.    At the end of 2019 she was also found to have a high PTH was referred to endocrinology.  I reviewed her pertinent labs Lab Results  Component Value Date   PTH 69 (H) 05/12/2019   PTH Comment 05/12/2019   PTH 32 12/03/2018   PTH 45 09/23/2018   PTH 92 (H) 06/14/2018   Lab Results  Component Value Date   CALCIUM 10.8 (H) 03/29/2020   CALCIUM 10.7 (H) 11/09/2019   CALCIUM 10.5 (H) 05/12/2019   CALCIUM 11.3 (H) 12/03/2018   CALCIUM 11.0 (H) 12/03/2018   CALCIUM 11.6 (H) 09/23/2018   CALCIUM 10.6 (H) 06/14/2018   CALCIUM 10.7 (H) 06/04/2018   CALCIUM 10.6 (H) 10/12/2017   CALCIUM 10.4 10/08/2016   CALCIUM 11.0 (H) 10/01/2015   CALCIUM 10.8 (H) 09/29/2014   CALCIUM 10.7 (H) 06/17/2013   CALCIUM 11.3 (H) 06/16/2012   CALCIUM 10.4 06/16/2011   CALCIUM 11.0 (H) 06/12/2010   CALCIUM 11.0 (H) 06/04/2009   CALCIUM 10.7 (H) 05/31/2008   CALCIUM 10.9 (H) 04/14/2007   Not on calcium supplements.  Reviewed previous investigations: Thyroid ultrasound (08/20/2018): 0.7 x 0.5 x 0.6 nodule at the left inferior thyroid pole, possibly a parathyroid adenoma  Technetium sestamibi parathyroid scan (08/20/2018): Suspicious for left inferior parathyroid  adenoma  She would prefer to avoid surgery.  Osteoporosis:  Reviewed the most recent DXA scan report: 06/29/2018 ([facility]) Lumbar spine L1-L4 Femoral neck (FN) 33% distal radius  ultra distal radius  T-score   +1.1 RFN: -2.3 LFN: -1.9  -3.8  -4.5   She has a history of an ankle fracture several years ago.  She is on: - Prolia: 12/24/2018, 08/04/2019, 02/09/2020  She was going to [employer] - 3x a week - weight bearing exercises, however, she stopped during the coronavirus.  She has a h/o scoliosis. Has back pain.  No history of kidney stones.  No history of CKD.  Latest BUN/Cr: Lab Results  Component Value Date   BUN 29 (H) 03/29/2020   BUN 29 (H) 11/09/2019   CREATININE 0.93 (H) 03/29/2020   CREATININE 0.88 11/09/2019   She is now back on HCTZ since 03/2020. Prev. On Amlodipine >> developed LE edema.  She has a history of vitamin D insufficiency: Lab Results  Component Value Date   VD25OH 58.20 11/09/2019   VD25OH 48.47 05/12/2019   VD25OH 32.98 12/03/2018   VD25OH 23.46 (L) 09/23/2018  She continues on 2000 units vitamin D daily.  She denies a family history of hypercalcemia, pituitary tumors, thyroid cancer, or osteoporosis.  Hypothyroidism: -Controlled  Pt is on levothyroxine 75 mcg daily, taken: - in am - fasting - at least 30 min from b'fast - no calcium - no iron - no multivitamins - no PPIs - not on Biotin  Reviewed  her TSH levels: Lab Results  Component Value Date   TSH 1.65 11/09/2019   TSH 1.47 12/03/2018   TSH 2.30 09/23/2018   TSH 2.15 10/12/2017   TSH 2.54 10/08/2016   TSH 1.93 10/01/2015   TSH 1.69 09/29/2014   TSH 2.46 06/17/2013   TSH 1.21 06/16/2012   TSH 2.09 06/16/2011   TSH 1.84 06/12/2010   TSH 1.82 06/04/2009   TSH 1.85 05/31/2008   TSH 2.11 04/14/2007   Pt denies: - feeling nodules in neck - hoarseness - dysphagia - choking - SOB with lying down  She takes B12, vitamin C, magnesium.  She also has a  history of HTN.  She started IO injections.  ROS: Constitutional: no weight gain/no weight loss, no fatigue, no subjective hyperthermia, no subjective hypothermia Eyes: no blurry vision, no xerophthalmia ENT: no sore throat, + see HPI Cardiovascular: no CP/no SOB/no palpitations/no leg swelling Respiratory: no cough/no SOB/no wheezing Gastrointestinal: no N/no V/no D/no C/no acid reflux Musculoskeletal: + muscle aches/+ joint aches Skin: no rashes, no hair loss Neurological: no tremors/no numbness/no tingling/no dizziness  I reviewed pt's medications, allergies, PMH, social hx, family hx, and changes were documented in the history of present illness. Otherwise, unchanged from my initial visit note.  Past Medical History:  Diagnosis Date  . BREAST BIOPSY, HX OF 11/27/2006  . COLONIC POLYPS, HX OF 11/27/2006  . HEMORRHOIDS 01/17/2010  . HYPERTENSION 11/27/2006  . HYPOTHYROIDISM 11/27/2006  . MENOPAUSAL SYNDROME 11/27/2006   Past Surgical History:  Procedure Laterality Date  . ABDOMINAL HYSTERECTOMY    . APPENDECTOMY    . BREAST EXCISIONAL BIOPSY Right 1987  . BREAST SURGERY     bx  . TONSILLECTOMY     Social History   Socioeconomic History  . Marital status: Widowed -husband died 16    Spouse name: Not on file  . Number of children: 3  . Years of education: Not on file  . Highest education level: Not on file  Occupational History  .  Retired  Scientific laboratory technician  . Financial resource strain: Not on file  . Food insecurity:    Worry: Not on file    Inability: Not on file  . Transportation needs:    Medical: Not on file    Non-medical: Not on file  Tobacco Use  . Smoking status: Never Smoker  . Smokeless tobacco: Never Used  Substance and Sexual Activity  . Alcohol use:  No      . Drug use: No   Current Outpatient Medications on File Prior to Visit  Medication Sig Dispense Refill  . hydrochlorothiazide (HYDRODIURIL) 25 MG tablet Take 1 tablet (25 mg total) by mouth  daily. (Patient not taking: Reported on 05/28/2020) 90 tablet 1  . lisinopril (ZESTRIL) 40 MG tablet TAKE 1 TABLET BY MOUTH  DAILY 90 tablet 1  . metroNIDAZOLE (METROGEL) 0.75 % gel Apply 1 application topically at bedtime.  (Patient not taking: Reported on 05/28/2020)    . SYNTHROID 75 MCG tablet TAKE 1 TABLET BY MOUTH  DAILY (Patient not taking: Reported on 05/28/2020) 90 tablet 3   No current facility-administered medications on file prior to visit.   Allergies  Allergen Reactions  . Codeine Phosphate     REACTION: unspecified   Family History  Problem Relation Age of Onset  . Heart disease Father   Also, diabetes and HTN and son.  PE: BP (!) 160/110   Pulse 73   Ht 5' (1.524 m)   Wt 149  lb 6.4 oz (67.8 kg)   SpO2 97%   BMI 29.18 kg/m  Wt Readings from Last 3 Encounters:  07/10/20 149 lb 6.4 oz (67.8 kg)  03/29/20 147 lb (66.7 kg)  03/16/20 146 lb 3.2 oz (66.3 kg)   Constitutional: normal weight, in NAD Eyes: PERRLA, EOMI, no exophthalmos ENT: moist mucous membranes, no thyromegaly, no cervical lymphadenopathy Cardiovascular: RRR, No MRG, no LE edema Respiratory: CTA B Gastrointestinal: abdomen soft, NT, ND, BS+ Musculoskeletal: + deformities (scoliosis, Heberden and Bouchard nodules on the hands), strength intact in all 4 Skin: moist, warm, no rashes Neurological: + tremor with outstretched hands, DTR normal in all 4  Assessment: 1. Hypercalcemia/hyperparathyroidism  2. Hypothyroidism  3.  Vitamin D insufficiency  4.  Osteoporosis  Plan: Patient with several instances of elevated calcium, with the highest being at 11.6.  The corresponding intact PTH was also high, at 92.  However, after normalizing her vitamin D level, this decreased to 32, which was still inappropriately high in the setting of a calcium of 11.3.  At last visit, calcium was mildly low at 110.7, and then, on 03/29/2020 at 10.8. -She does have osteoporosis is a possible complication for  hyperparathyroidism.  This is more pronounced at the level of the 33% distal radius and ultra distal radius and she also has a history of ankle fracture.  She denies abdominal pain, depression, bone pain, kidney stones.  Previous imaging tests pointing towards a left inferior parathyroid adenoma.  Per her preference and also due to age, we are following without surgery. -We will check her vitamin D at next OV -However, we will go ahead and check an ionized calcium level today, since she was started on HCTZ 3 months ago.  We did discuss that if the calcium is higher, we will need to switch her from HCTZ to furosemide.  If the calcium is approximately the same, if possible, I would still suggest a change in therapy, but in that case, we can wait until the next appointment with PCP. -I will see her back in a year  2. Hypothyroidism - latest thyroid labs reviewed with pt >> normal: Lab Results  Component Value Date   TSH 1.65 11/09/2019   - she continues on LT4 75 mcg daily - pt feels good on this dose. - we discussed about taking the thyroid hormone every day, with water, >30 minutes before breakfast, separated by >4 hours from acid reflux medications, calcium, iron, multivitamins. Pt. is taking it correctly. - will check thyroid tests today: TSH and fT4 - If labs are abnormal, she will need to return for repeat TFTs in 1.5 months  3.  Vitamin D insufficiency -She had a low vitamin D level in 09/2018 after which we started 2000 units vitamin D daily -Latest vitamin D level was normal at last visit, in 10/2019 -We will not recheck the level today  4. OP -Likely postmenopausal/age-related + due to hyperparathyroidism -Reviewed her bone density results from 2019: Significant osteoporosis at the radius level.  This reflects the quality of her trabecular bone and this can be used as a surrogate marker for BMD of the spine. The spine appears to have a higher bone density most likely due to calcium  accumulation in the setting of osteoarthritis. -We started Prolia in 12/2018 and she had 3 injections so far, last on 02/09/2020 -She tolerates this well, without or hip pain, or jaw pain -We can continue with Prolia for now, up to 10 years -We discussed  that Prolia can also result in her calcium levels -She is not due for another DXA scan.  We will order this today and I will be in touch with her about the results -I will see her back in a year  Component     Latest Ref Rng & Units 07/10/2020  TSH     0.35 - 4.50 uIU/mL 2.56  T4,Free(Direct)     0.60 - 1.60 ng/dL 1.15  Calcium Ionized     4.8 - 5.6 mg/dL 5.8 (H)  Ionized calcium is slightly high.  Will advise her to stay well-hydrated.  Thyroid tests are normal. As we discussed, we will stop HCTZ and start furosemide 20 mg daily and she has an appointment with PCP in few days to check on her blood pressure.  Philemon Kingdom, MD PhD [facility]

## 2020-07-11 LAB — CALCIUM, IONIZED: Calcium, Ion: 5.8 mg/dL — ABNORMAL HIGH (ref 4.8–5.6)

## 2020-07-11 MED ORDER — FUROSEMIDE 20 MG PO TABS: 20.0000 mg | ORAL_TABLET | Freq: Every day | ORAL | 3 refills | Status: DC

## 2020-07-11 NOTE — Addendum Note (Signed)
Addended by: Carlus Pavlov on: 07/11/2020 01:31 PM   Modules accepted: Orders

## 2020-07-18 ENCOUNTER — Other Ambulatory Visit: Payer: Self-pay

## 2020-07-18 ENCOUNTER — Encounter: Payer: Self-pay | Admitting: Internal Medicine

## 2020-07-18 ENCOUNTER — Ambulatory Visit: Payer: Medicare Other | Admitting: Internal Medicine

## 2020-07-18 VITALS — BP 190/90 | HR 80 | Temp 98.1°F | Wt 148.7 lb

## 2020-07-18 DIAGNOSIS — I10 Essential (primary) hypertension: Secondary | ICD-10-CM | POA: Diagnosis not present

## 2020-07-18 DIAGNOSIS — E210 Primary hyperparathyroidism: Secondary | ICD-10-CM

## 2020-07-18 DIAGNOSIS — M818 Other osteoporosis without current pathological fracture: Secondary | ICD-10-CM

## 2020-07-18 DIAGNOSIS — E039 Hypothyroidism, unspecified: Secondary | ICD-10-CM

## 2020-07-18 NOTE — Progress Notes (Signed)
Established Patient Office Visit     This visit occurred during the SARS-CoV-2 public health emergency.  Safety protocols were in place, including screening questions prior to the visit, additional usage of staff PPE, and extensive cleaning of exam room while observing appropriate contact time as indicated for disinfecting solutions.    CC/Reason for Visit: Blood pressure follow-up  HPI: Janice Small is a 87 y.o. female who is coming in today for the above mentioned reasons. Past Medical History is significant for: Hypertension, hypothyroidism, osteoporosis as well as hyperparathyroidism with intermittent hypercalcemia.  She is followed by endocrinology for this.  I have been seeing her frequently for blood pressure management.  Blood pressure is always significantly elevated while in the office, however her ambulatory blood pressure measurements are well within range with a maximum of 130/80.  She is currently on lisinopril 40 mg daily and furosemide 20 mg daily.  She was taken off hydrochlorothiazide and placed on furosemide instead by endocrinology due to concerns with hypercalcemia.  Since I last saw her she had significant vision changes with floaters, went to see a retina specialist and has been diagnosed with retinal vein occlusion and hypertensive retinopathy.  She has been getting "eye shots per report".  She is very concerned about her elevated blood pressure readings when she visits any medical facility.   Past Medical/Surgical History: Past Medical History:  Diagnosis Date  . BREAST BIOPSY, HX OF 11/27/2006  . COLONIC POLYPS, HX OF 11/27/2006  . HEMORRHOIDS 01/17/2010  . HYPERTENSION 11/27/2006  . HYPOTHYROIDISM 11/27/2006  . MENOPAUSAL SYNDROME 11/27/2006    Past Surgical History:  Procedure Laterality Date  . ABDOMINAL HYSTERECTOMY    . APPENDECTOMY    . BREAST EXCISIONAL BIOPSY Right 1987  . BREAST SURGERY     bx  . TONSILLECTOMY      Social History:  reports  that she has never smoked. She has never used smokeless tobacco. She reports current alcohol use. She reports that she does not use drugs.  Allergies: Allergies  Allergen Reactions  . Codeine Phosphate     REACTION: unspecified    Family History:  Family History  Problem Relation Age of Onset  . Heart disease Father      Current Outpatient Medications:  .  furosemide (LASIX) 20 MG tablet, Take 1 tablet (20 mg total) by mouth daily., Disp: 90 tablet, Rfl: 3 .  lisinopril (ZESTRIL) 40 MG tablet, TAKE 1 TABLET BY MOUTH  DAILY, Disp: 90 tablet, Rfl: 1 .  metroNIDAZOLE (METROGEL) 0.75 % gel, Apply 1 application topically daily as needed., Disp: , Rfl:  .  SYNTHROID 75 MCG tablet, TAKE 1 TABLET BY MOUTH  DAILY, Disp: 90 tablet, Rfl: 3  Review of Systems:  Constitutional: Denies fever, chills, diaphoresis, appetite change and fatigue.  HEENT: Denies photophobia, eye pain, redness, hearing loss, ear pain, congestion, sore throat, rhinorrhea, sneezing, mouth sores, trouble swallowing, neck pain, neck stiffness and tinnitus.   Respiratory: Denies SOB, DOE, cough, chest tightness,  and wheezing.   Cardiovascular: Denies chest pain, palpitations and leg swelling.  Gastrointestinal: Denies nausea, vomiting, abdominal pain, diarrhea, constipation, blood in stool and abdominal distention.  Genitourinary: Denies dysuria, urgency, frequency, hematuria, flank pain and difficulty urinating.  Endocrine: Denies: hot or cold intolerance, sweats, changes in hair or nails, polyuria, polydipsia. Musculoskeletal: Denies myalgias, back pain, joint swelling, arthralgias and gait problem.  Skin: Denies pallor, rash and wound.  Neurological: Denies dizziness, seizures, syncope, weakness, light-headedness, numbness and headaches.  Hematological: Denies adenopathy. Easy bruising, personal or family bleeding history  Psychiatric/Behavioral: Denies suicidal ideation, mood changes, confusion, nervousness, sleep  disturbance and agitation    Physical Exam: Vitals:   07/18/20 1054  BP: (!) 190/90  Pulse: 80  Temp: 98.1 F (36.7 C)  TempSrc: Oral  SpO2: 97%  Weight: 148 lb 11.2 oz (67.4 kg)    Body mass index is 29.04 kg/m.   Constitutional: NAD, calm, comfortable Eyes: PERRL, lids and conjunctivae normal ENMT: Mucous membranes are moist.  Respiratory: clear to auscultation bilaterally, no wheezing, no crackles. Normal respiratory effort. No accessory muscle use.  Cardiovascular: Regular rate and rhythm, no murmurs / rubs / gallops. No extremity edema.  Neurologic: Grossly intact and nonfocal Psychiatric: Normal judgment and insight. Alert and oriented x 3. Normal mood.    Impression and Plan:  Other osteoporosis without current pathological fracture Primary hyperparathyroidism (HCC) -Followed by endocrinology stop  Acquired hypothyroidism -Followed by endocrine, most recent TSH was normal at 2.560 in December 2021, she is on levothyroxine 75 mcg daily.  Essential hypertension -As usual, in office measurements are elevated.  It is 190/90 today. -However her ambulatory measurements are well within range at around 120/70 to 130/80. -For now no blood pressure medication changes will be made.  When she returns in 2 to 3 months I have asked her to bring both her ambulatory measurements and her blood pressure cuff to make sure that it is giving her accurate measurements.  She has been diagnosed with a significant element of whitecoat hypertension in the past.   Patient Instructions  -Nice seeing you today!!  -Schedule follow up in 3 months. Bring in your blood pressure measurements and your machine.     Chaya Jan, MD [facility]

## 2020-07-18 NOTE — Patient Instructions (Signed)
-  Nice seeing you today!!  -Schedule follow up in 3 months. Bring in your blood pressure measurements and your machine.

## 2020-07-19 NOTE — Progress Notes (Signed)
[facility] Note  07/23/2020     CHIEF COMPLAINT Patient presents for Retina Follow Up   HISTORY OF PRESENT ILLNESS: Janice Small is a 87 y.o. female who presents to the clinic today for:   HPI    Retina Follow Up    Patient presents with  CRVO/BRVO.  In left eye.  This started months ago.  Severity is moderate.  Duration of 4 weeks.  Since onset it is stable.  I, the attending physician,  performed the HPI with the patient and updated documentation appropriately.          Comments    87 y/o female pt here for 4 wk f/u for BRVO OS.  No change in [place] OU noticed.  Denies pain, FOL.  Rarely sees small floaters OS.  No gtts.       Last edited by Bernarda Caffey, MD on 07/23/2020 11:37 AM. (History)    pt states vision stable    Referring physician: Isaac Bliss, Rayford Halsted, MD [place],  [place] [address]  HISTORICAL INFORMATION:   Selected notes from the MEDICAL RECORD NUMBER Referred by Dr. Jerline Pain for eval of BRVO OS LEE: 10.12.2021 Ocular Hx- Glc suspect PMH-    CURRENT MEDICATIONS: No current outpatient medications on file. (Ophthalmic Drugs)   No current facility-administered medications for this visit. (Ophthalmic Drugs)   Current Outpatient Medications (Other)  Medication Sig   furosemide (LASIX) 20 MG tablet Take 1 tablet (20 mg total) by mouth daily.   lisinopril (ZESTRIL) 40 MG tablet TAKE 1 TABLET BY MOUTH  DAILY   metroNIDAZOLE (METROGEL) 0.75 % gel Apply 1 application topically daily as needed.   SYNTHROID 75 MCG tablet TAKE 1 TABLET BY MOUTH  DAILY   No current facility-administered medications for this visit. (Other)      REVIEW OF SYSTEMS: ROS    Positive for: Musculoskeletal, Eyes   Negative for: Constitutional, Gastrointestinal, Neurological, Skin, Genitourinary, HENT, Endocrine, Cardiovascular, Respiratory, Psychiatric, Allergic/Imm, Heme/Lymph   Last edited by Matthew Folks, COA on  07/23/2020  9:46 AM. (History)       ALLERGIES Allergies  Allergen Reactions   Codeine Phosphate     REACTION: unspecified    PAST MEDICAL HISTORY Past Medical History:  Diagnosis Date   BREAST BIOPSY, HX OF 11/27/2006   COLONIC POLYPS, HX OF 11/27/2006   HEMORRHOIDS 01/17/2010   HYPERTENSION 11/27/2006   Hypertensive retinopathy    OU   HYPOTHYROIDISM 11/27/2006   Macular degeneration    Dry OU   MENOPAUSAL SYNDROME 11/27/2006   Past Surgical History:  Procedure Laterality Date   ABDOMINAL HYSTERECTOMY     APPENDECTOMY     BREAST EXCISIONAL BIOPSY Right 1987   BREAST SURGERY     bx   CATARACT EXTRACTION Bilateral    EYE SURGERY Bilateral    Cat Sx OU   TONSILLECTOMY      FAMILY HISTORY Family History  Problem Relation Age of Onset   Heart disease Father     SOCIAL HISTORY Social History   Tobacco Use   Smoking status: Never Smoker   Smokeless tobacco: Never Used  Substance Use Topics   Alcohol use: Yes    Comment: rarely   Drug use: No         OPHTHALMIC EXAM:  Base Eye Exam    Visual Acuity (Snellen - Linear)      Right Left   Dist cc 20/20 -2 20/30 -2  Dist ph cc  NI   Correction: Glasses       Tonometry (Tonopen, 9:48 AM)      Right Left   Pressure 15 15       Pupils      Dark Light Shape React APD   Right 3 2 Round Brisk None   Left 3 2 Round Brisk None       Visual Fields (Counting fingers)      Left Right    Full Full       Extraocular Movement      Right Left    Full, Ortho Full, Ortho       Neuro/Psych    Oriented x3: Yes   Mood/Affect: Normal       Dilation    Both eyes: 1.0% Mydriacyl, 2.5% Phenylephrine @ 9:48 AM        Slit Lamp and Fundus Exam    External Exam      Right Left   External  Ecchymosis lateral canthes        Slit Lamp Exam      Right Left   Lids/Lashes Dermatochalasis - upper lid Dermatochalasis - upper lid, mild Meibomian gland dysfunction   Conjunctiva/Sclera White  and quiet White and quiet   Cornea 2-3+ inferior Punctate epithelial erosions, well healed temporal cataract wounds 1-2+ inferior Punctate epithelial erosions, well healed temporal cataract wounds, arcus   Anterior Chamber Deep and quiet Deep and quiet   Iris Round and dilated Round and dilated   Lens 3 piece PC IOL in good position with open PC 3 piece PC IOL in good position, 1+PCO (non-central)   Vitreous Vitreous syneresis Vitreous syneresis, Posterior vitreous detachment       Fundus Exam      Right Left   Disc Pink and Sharp, Compact mild Pallor, Sharp rim, temporal PPA, Compact   C/D Ratio 0.3 0.5   Macula Flat, Blunted foveal reflex, mild Drusen, RPE mottling and clumping, no heme Blunted foveal reflex, scattered IRH/DBH -- improved, focal edema temporal macula -- persistent -- minimal improvement, Drusen   Vessels attenuated, Tortuous attenuated, Tortuous, severe attenuation of vessels superior macula   Periphery Attached, No heme  Attached, mild reticular degeneration          IMAGING AND PROCEDURES  Imaging and Procedures for 07/23/2020  OCT, Retina - OU - Both Eyes       Right Eye Quality was good. Central Foveal Thickness: 269. Progression has been stable. Findings include normal foveal contour, no IRF, no SRF, retinal drusen .   Left Eye Quality was good. Central Foveal Thickness: 295. Progression has been stable. Findings include normal foveal contour, intraretinal fluid, intraretinal hyper-reflective material, retinal drusen , no SRF (persistent IRF temporal macula -- minimal improvement).   Notes *Images captured and stored on drive  Diagnosis / Impression:  OD: non-exu ARMD w/ fine drusen OS: BRVO w/ CME -- persistent IRF temporal macula -- minimal improvement  Clinical management:  See below  Abbreviations: NFP - Normal foveal profile. CME - cystoid macular edema. PED - pigment epithelial detachment. IRF - intraretinal fluid. SRF - subretinal fluid. EZ -  ellipsoid zone. ERM - epiretinal membrane. ORA - outer retinal atrophy. ORT - outer retinal tubulation. SRHM - subretinal hyper-reflective material. IRHM - intraretinal hyper-reflective material        Intravitreal Injection, Pharmacologic Agent - OS - Left Eye       Time Out 07/23/2020. 10:29 AM. Confirmed correct patient, procedure,  site, and patient consented.   Anesthesia Topical anesthesia was used. Anesthetic medications included Lidocaine 2%, Proparacaine 0.5%.   Procedure Preparation included 5% betadine to ocular surface, eyelid speculum. A supplied (32g ) needle was used.   Injection:  1.25 mg Bevacizumab (AVASTIN) 1.25mg /0.49mL SOLN   NDC: 42353-614-43, Lot: [id number] , Expiration date: 08/22/2020   Route: Intravitreal, Site: Left Eye, Waste: 0 mL  Post-op Post injection exam found visual acuity of at least counting fingers. The patient tolerated the procedure well. There were no complications. The patient received written and verbal post procedure care education. Post injection medications were not given.                 ASSESSMENT/PLAN:    ICD-10-CM   1. Branch retinal vein occlusion of left eye with macular edema  H34.8320 Intravitreal Injection, Pharmacologic Agent - OS - Left Eye    Bevacizumab (AVASTIN) SOLN 1.25 mg  2. Essential hypertension  I10   3. Retinal edema  H35.81 OCT, Retina - OU - Both Eyes  4. Hypertensive retinopathy of both eyes  H35.033   5. Early dry stage nonexudative age-related macular degeneration of both eyes  H35.3131   6. Pseudophakia, both eyes  Z96.1   7. Glaucoma suspect of both eyes  H40.003     1,2. BRVO w/ CME OS             - S/P IVA OS #1 (10.18.21), #2 (11.15.21), #3 (12.13.21) - BCVA 20/30 -- stable - OCT shows persistent IRF temporal macula -- minimal improvement -- ?IVA resistance - discussed possible switch in medication - recommend IVA OS #4 today, 01.10.22  - pt wishes to proceed - RBA of procedure discussed,  questions answered - Avastin informed consent obtained and signed, 10.18.21 (OS) - see procedure note - F/U 4 weeks -- DFE/OCT/possible injection   3,4. Hypertensive retinopathy OU - discussed importance of tight BP control - monitor  5. Age related macular degeneration, non-exudative, both eyes  - The incidence, anatomy, and pathology of dry AMD, risk of progression, and the AREDS and AREDS 2 study including smoking risks discussed with patient.  - Recommend amsler grid monitoring  - f/u 3 months, DFE, OCT  6. Pseudophakia OU  - s/p CE/IOL  - IOL in good position, doing well  - monitor  7. Glaucoma Suspect  - IOP 15 OU  - under the expert care of Dr. 10.20.21  Ophthalmic Meds Ordered this visit:  Meds ordered this encounter  Medications   Bevacizumab (AVASTIN) SOLN 1.25 mg       Return in about 4 weeks (around 08/20/2020) for f/u BRVO OS, DFE, OCT.  There are no Patient Instructions on file for this visit.  This document serves as a record of services personally performed by 10/18/2020, MD, PhD. It was created on their behalf by Karie Chimera, COA, an ophthalmic technician. The creation of this record is the provider's dictation and/or activities during the visit.    Electronically signed by: Herby Abraham, COA @TODAY @ 11:38 AM  Herby Abraham, M.D., Ph.D. Diseases & Surgery of the Retina and Vitreous [facility] 07/23/2020    I have reviewed the above documentation for accuracy and completeness, and I agree with the above. [facility], M.D., Ph.D. 07/23/20 11:39 AM   Abbreviations: M myopia (nearsighted); A astigmatism; H hyperopia (farsighted); P presbyopia; Mrx spectacle prescription;  CTL contact lenses; OD right eye; OS left eye; OU both eyes  XT exotropia; ET esotropia; PEK punctate epithelial keratitis; PEE punctate epithelial erosions; DES dry eye syndrome; MGD meibomian gland dysfunction; ATs artificial tears; PFAT's  preservative free artificial tears; [place] nuclear sclerotic cataract; PSC posterior subcapsular cataract; ERM epi-retinal membrane; PVD posterior vitreous detachment; RD retinal detachment; DM diabetes mellitus; DR diabetic retinopathy; NPDR non-proliferative diabetic retinopathy; PDR proliferative diabetic retinopathy; CSME clinically significant macular edema; DME diabetic macular edema; dbh dot blot hemorrhages; CWS cotton wool spot; POAG primary open angle glaucoma; C/D cup-to-disc ratio; HVF humphrey visual field; GVF goldmann visual field; OCT optical coherence tomography; IOP intraocular pressure; BRVO Branch retinal vein occlusion; CRVO central retinal vein occlusion; CRAO central retinal artery occlusion; BRAO branch retinal artery occlusion; RT retinal tear; SB scleral buckle; PPV pars plana vitrectomy; VH Vitreous hemorrhage; PRP panretinal laser photocoagulation; IVK intravitreal kenalog; VMT vitreomacular traction; MH Macular hole;  NVD neovascularization of the disc; NVE neovascularization elsewhere; AREDS age related eye disease study; ARMD age related macular degeneration; POAG primary open angle glaucoma; EBMD epithelial/anterior basement membrane dystrophy; ACIOL anterior chamber intraocular lens; IOL intraocular lens; PCIOL posterior chamber intraocular lens; Phaco/IOL phacoemulsification with intraocular lens placement; [place] photorefractive keratectomy; LASIK laser assisted in situ keratomileusis; HTN hypertension; DM diabetes mellitus; COPD chronic obstructive pulmonary disease

## 2020-07-23 ENCOUNTER — Ambulatory Visit (INDEPENDENT_AMBULATORY_CARE_PROVIDER_SITE_OTHER): Payer: Medicare Other | Admitting: Ophthalmology

## 2020-07-23 ENCOUNTER — Encounter (INDEPENDENT_AMBULATORY_CARE_PROVIDER_SITE_OTHER): Payer: Self-pay | Admitting: Ophthalmology

## 2020-07-23 ENCOUNTER — Other Ambulatory Visit: Payer: Self-pay

## 2020-07-23 ENCOUNTER — Other Ambulatory Visit: Payer: Self-pay | Admitting: Internal Medicine

## 2020-07-23 DIAGNOSIS — H3581 Retinal edema: Secondary | ICD-10-CM

## 2020-07-23 DIAGNOSIS — H353131 Nonexudative age-related macular degeneration, bilateral, early dry stage: Secondary | ICD-10-CM

## 2020-07-23 DIAGNOSIS — I10 Essential (primary) hypertension: Secondary | ICD-10-CM

## 2020-07-23 DIAGNOSIS — H35033 Hypertensive retinopathy, bilateral: Secondary | ICD-10-CM

## 2020-07-23 DIAGNOSIS — H40003 Preglaucoma, unspecified, bilateral: Secondary | ICD-10-CM

## 2020-07-23 DIAGNOSIS — Z961 Presence of intraocular lens: Secondary | ICD-10-CM

## 2020-07-23 DIAGNOSIS — H34832 Tributary (branch) retinal vein occlusion, left eye, with macular edema: Secondary | ICD-10-CM | POA: Diagnosis not present

## 2020-07-23 MED ORDER — BEVACIZUMAB CHEMO INJECTION 1.25MG/0.05ML SYRINGE FOR KALEIDOSCOPE
1.2500 mg | INTRAVITREAL | Status: AC | PRN
  Administered 2020-07-23: 1.25 mg via INTRAVITREAL

## 2020-08-14 NOTE — Progress Notes (Signed)
[facility] Note  08/20/2020     CHIEF COMPLAINT Patient presents for Retina Follow Up   HISTORY OF PRESENT ILLNESS: Janice Small is a 87 y.o. female who presents to the clinic today for:   HPI    Retina Follow Up    Patient presents with  CRVO/BRVO.  In left eye.  This started months ago.  Severity is moderate.  Duration of 4 weeks.  Since onset it is stable.  I, the attending physician,  performed the HPI with the patient and updated documentation appropriately.          Comments    87 y/o female pt here for 4 wk f/u for BRVO OS.  Hard to tell if [place] OU has changed any.  Denies pain, FOL, floaters.  No gtts.       Last edited by Bernarda Caffey, MD on 08/20/2020 10:26 AM. (History)    pt states she has a hard time reading the newspaper bc the print is so small, other than that, she thinks her vision is okay   Referring physician: Isaac Bliss, Rayford Halsted, MD [place],  [place] [address]  HISTORICAL INFORMATION:   Selected notes from the [place] Referred by Dr. Jerline Pain for eval of BRVO OS LEE: 10.12.2021 Ocular Hx- Glc suspect PMH-    CURRENT MEDICATIONS: No current outpatient medications on file. (Ophthalmic Drugs)   No current facility-administered medications for this visit. (Ophthalmic Drugs)   Current Outpatient Medications (Other)  Medication Sig  . furosemide (LASIX) 20 MG tablet Take 1 tablet (20 mg total) by mouth daily.  Marland Kitchen lisinopril (ZESTRIL) 40 MG tablet TAKE 1 TABLET BY MOUTH  DAILY  . metroNIDAZOLE (METROGEL) 0.75 % gel Apply 1 application topically daily as needed.  Marland Kitchen SYNTHROID 75 MCG tablet TAKE 1 TABLET BY MOUTH  DAILY   No current facility-administered medications for this visit. (Other)      REVIEW OF SYSTEMS: ROS    Positive for: Gastrointestinal, Musculoskeletal, Eyes   Negative for: Constitutional, Neurological, Skin, Genitourinary, HENT, Endocrine, Cardiovascular,  Respiratory, Psychiatric, Allergic/Imm, Heme/Lymph   Last edited by Matthew Folks, COA on 08/20/2020  9:40 AM. (History)       ALLERGIES Allergies  Allergen Reactions  . Codeine Phosphate     REACTION: unspecified    PAST MEDICAL HISTORY Past Medical History:  Diagnosis Date  . BREAST BIOPSY, HX OF 11/27/2006  . COLONIC POLYPS, HX OF 11/27/2006  . HEMORRHOIDS 01/17/2010  . HYPERTENSION 11/27/2006  . Hypertensive retinopathy    OU  . HYPOTHYROIDISM 11/27/2006  . Macular degeneration    Dry OU  . MENOPAUSAL SYNDROME 11/27/2006   Past Surgical History:  Procedure Laterality Date  . ABDOMINAL HYSTERECTOMY    . APPENDECTOMY    . BREAST EXCISIONAL BIOPSY Right 1987  . BREAST SURGERY     bx  . CATARACT EXTRACTION Bilateral   . EYE SURGERY Bilateral    Cat Sx OU  . TONSILLECTOMY      FAMILY HISTORY Family History  Problem Relation Age of Onset  . Heart disease Father     SOCIAL HISTORY Social History   Tobacco Use  . Smoking status: Never Smoker  . Smokeless tobacco: Never Used  Substance Use Topics  . Alcohol use: Yes    Comment: rarely  . Drug use: No         OPHTHALMIC EXAM:  Base Eye Exam    Visual Acuity (  Snellen - Linear)      Right Left   Dist cc 20/20 - 20/30 -   Dist ph cc  NI   Correction: Glasses       Tonometry (Tonopen, 9:42 AM)      Right Left   Pressure 14 15       Pupils      Dark Light Shape React APD   Right 3 2 Round Brisk None   Left 3 2 Round Brisk None       Visual Fields (Counting fingers)      Left Right    Full Full       Extraocular Movement      Right Left    Full, Ortho Full, Ortho       Neuro/Psych    Oriented x3: Yes   Mood/Affect: Normal       Dilation    Both eyes: 1.0% Mydriacyl, 2.5% Phenylephrine @ 9:42 AM        Slit Lamp and Fundus Exam    External Exam      Right Left   External  Ecchymosis lateral canthes        Slit Lamp Exam      Right Left   Lids/Lashes Dermatochalasis - upper lid  Dermatochalasis - upper lid, mild Meibomian gland dysfunction   Conjunctiva/Sclera White and quiet White and quiet   Cornea 2-3+ inferior Punctate epithelial erosions, well healed temporal cataract wounds, arcus 2-3+ inferior Punctate epithelial erosions, well healed temporal cataract wounds, arcus   Anterior Chamber Deep and quiet Deep and quiet   Iris Round and dilated Round and dilated   Lens 3 piece PC IOL in good position with open PC 3 piece PC IOL in good position, 1+PCO (non-central)   Vitreous Vitreous syneresis Vitreous syneresis, Posterior vitreous detachment       Fundus Exam      Right Left   Disc Pink and Sharp, Compact mild Pallor, Sharp rim, temporal PPA, Compact   C/D Ratio 0.3 0.2   Macula Flat, Blunted foveal reflex, mild Drusen, RPE mottling and clumping, no heme Blunted foveal reflex, scattered IRH/DBH -- improved, good targets for focal laser, focal edema temporal macula -- persistent -- minimal improvement, Drusen   Vessels attenuated, Tortuous attenuated, Tortuous, severe attenuation of vessels superior macula   Periphery Attached, No heme  Attached, mild reticular degeneration          IMAGING AND PROCEDURES  Imaging and Procedures for 08/20/2020  OCT, Retina - OU - Both Eyes       Right Eye Quality was good. Central Foveal Thickness: 270. Progression has been stable. Findings include normal foveal contour, no IRF, no SRF, retinal drusen .   Left Eye Quality was good. Central Foveal Thickness: 291. Progression has been stable. Findings include normal foveal contour, intraretinal fluid, intraretinal hyper-reflective material, retinal drusen , no SRF (persistent IRF temporal macula -- minimal improvement).   Notes *Images captured and stored on drive  Diagnosis / Impression:  OD: non-exu ARMD w/ fine drusen OS: BRVO w/ CME -- persistent IRF temporal macula -- minimal improvement  Clinical management:  See below  Abbreviations: NFP - Normal foveal  profile. CME - cystoid macular edema. PED - pigment epithelial detachment. IRF - intraretinal fluid. SRF - subretinal fluid. EZ - ellipsoid zone. ERM - epiretinal membrane. ORA - outer retinal atrophy. ORT - outer retinal tubulation. SRHM - subretinal hyper-reflective material. IRHM - intraretinal hyper-reflective material  Intravitreal Injection, Pharmacologic Agent - OS - Left Eye       Time Out 08/20/2020. 9:53 AM. Confirmed correct patient, procedure, site, and patient consented.   Anesthesia Topical anesthesia was used. Anesthetic medications included Lidocaine 2%, Proparacaine 0.5%.   Procedure Preparation included 5% betadine to ocular surface, eyelid speculum. A supplied (32g ) needle was used.   Injection:  1.25 mg Bevacizumab (AVASTIN) 1.25mg /0.65mL SOLN   NDC: 10272-536-64, Lot: [id number] , Expiration date: 09/19/2020   Route: Intravitreal, Site: Left Eye, Waste: 0 mL  Post-op Post injection exam found visual acuity of at least counting fingers. The patient tolerated the procedure well. There were no complications. The patient received written and verbal post procedure care education. Post injection medications were not given.                 ASSESSMENT/PLAN:    ICD-10-CM   1. Branch retinal vein occlusion of left eye with macular edema  H34.8320 Intravitreal Injection, Pharmacologic Agent - OS - Left Eye    Bevacizumab (AVASTIN) SOLN 1.25 mg  2. Essential hypertension  I10   3. Retinal edema  H35.81 OCT, Retina - OU - Both Eyes  4. Hypertensive retinopathy of both eyes  H35.033   5. Early dry stage nonexudative age-related macular degeneration of both eyes  H35.3131   6. Pseudophakia, both eyes  Z96.1   7. Glaucoma suspect of both eyes  H40.003     1,2. BRVO w/ CME OS             - S/P IVA OS #1 (10.18.21), #2 (11.15.21), #3 (12.13.21), #4 (01.10.22) - BCVA 20/30 -- stable - OCT shows persistent IRF temporal macula -- minimal improvement -- ?IVA  resistance - discussed possible switch in medication and possible focal laser - recommend IVA OS #5 today, 02.07.22  - pt wishes to proceed - RBA of procedure discussed, questions answered - Avastin informed consent obtained and signed, 10.18.21 (OS) - see procedure note - F/U next Wednesday -- DFE/OCT/focal laser OS   3,4. Hypertensive retinopathy OU - discussed importance of tight BP control - monitor  5. Age related macular degeneration, non-exudative, both eyes  - The incidence, anatomy, and pathology of dry AMD, risk of progression, and the AREDS and AREDS 2 study including smoking risks discussed with patient.  - Recommend amsler grid monitoring  - f/u 3 months, DFE, OCT  6. Pseudophakia OU  - s/p CE/IOL  - IOL in good position, doing well  - monitor  7. Glaucoma Suspect  - IOP 14,15 OU  - under the expert care of Dr. Tuesday  Ophthalmic Meds Ordered this visit:  Meds ordered this encounter  Medications  . Bevacizumab (AVASTIN) SOLN 1.25 mg       Return in about 9 days (around 08/29/2020) for f/u BRVO OS, DFE, OCT, focal laser OS.  There are no Patient Instructions on file for this visit.  This document serves as a record of services personally performed by 09/11/2020, MD, PhD. It was created on their behalf by Gardiner Sleeper, [place], an ophthalmic technician. The creation of this record is the provider's dictation and/or activities during the visit.    Electronically signed by: [address], [place] 02.01.2022 10:27 AM   This document serves as a record of services personally performed by 15.01.2022, MD, PhD. It was created on their behalf by Gardiner Sleeper. San Jetty, OA an ophthalmic technician. The creation of this record is the provider's dictation and/or activities during  the visit.    Electronically signed by: San Jetty. Marguerita Merles 02.07.2022 10:27 AM  Gardiner Sleeper, M.D., Ph.D. Diseases & Surgery of the Retina and [place] 08/20/2020   I have reviewed the above documentation for accuracy and completeness, and I agree with the above. Gardiner Sleeper, M.D., Ph.D. 08/20/20 10:27 AM   Abbreviations: M myopia (nearsighted); A astigmatism; H hyperopia (farsighted); P presbyopia; Mrx spectacle prescription;  CTL contact lenses; OD right eye; OS left eye; OU both eyes  XT exotropia; ET esotropia; PEK punctate epithelial keratitis; PEE punctate epithelial erosions; DES dry eye syndrome; MGD meibomian gland dysfunction; ATs artificial tears; PFAT's preservative free artificial tears; [place] nuclear sclerotic cataract; PSC posterior subcapsular cataract; ERM epi-retinal membrane; PVD posterior vitreous detachment; RD retinal detachment; DM diabetes mellitus; DR diabetic retinopathy; NPDR non-proliferative diabetic retinopathy; PDR proliferative diabetic retinopathy; CSME clinically significant macular edema; DME diabetic macular edema; dbh dot blot hemorrhages; CWS cotton wool spot; POAG primary open angle glaucoma; C/D cup-to-disc ratio; HVF humphrey visual field; GVF goldmann visual field; OCT optical coherence tomography; IOP intraocular pressure; BRVO Branch retinal vein occlusion; CRVO central retinal vein occlusion; CRAO central retinal artery occlusion; BRAO branch retinal artery occlusion; RT retinal tear; SB scleral buckle; PPV pars plana vitrectomy; VH Vitreous hemorrhage; PRP panretinal laser photocoagulation; IVK intravitreal kenalog; VMT vitreomacular traction; MH Macular hole;  NVD neovascularization of the disc; NVE neovascularization elsewhere; AREDS age related eye disease study; ARMD age related macular degeneration; POAG primary open angle glaucoma; EBMD epithelial/anterior basement membrane dystrophy; ACIOL anterior chamber intraocular lens; IOL intraocular lens; PCIOL posterior chamber intraocular lens; Phaco/IOL phacoemulsification with intraocular lens placement; [place] photorefractive keratectomy; LASIK laser  assisted in situ keratomileusis; HTN hypertension; DM diabetes mellitus; COPD chronic obstructive pulmonary disease

## 2020-08-15 ENCOUNTER — Telehealth: Payer: Self-pay | Admitting: Internal Medicine

## 2020-08-15 NOTE — Telephone Encounter (Signed)
Pt called stating she needs to schedule an appointment for her prolia shot. Notified pt we do not have a person in office to do these shots and will reach out to her when we find someone.  Ph#:  [phone] or cell [phone]

## 2020-08-20 ENCOUNTER — Ambulatory Visit (INDEPENDENT_AMBULATORY_CARE_PROVIDER_SITE_OTHER): Payer: Medicare Other | Admitting: Ophthalmology

## 2020-08-20 ENCOUNTER — Other Ambulatory Visit: Payer: Self-pay

## 2020-08-20 ENCOUNTER — Encounter (INDEPENDENT_AMBULATORY_CARE_PROVIDER_SITE_OTHER): Payer: Self-pay | Admitting: Ophthalmology

## 2020-08-20 DIAGNOSIS — H3581 Retinal edema: Secondary | ICD-10-CM

## 2020-08-20 DIAGNOSIS — H35033 Hypertensive retinopathy, bilateral: Secondary | ICD-10-CM | POA: Diagnosis not present

## 2020-08-20 DIAGNOSIS — Z961 Presence of intraocular lens: Secondary | ICD-10-CM

## 2020-08-20 DIAGNOSIS — H40003 Preglaucoma, unspecified, bilateral: Secondary | ICD-10-CM

## 2020-08-20 DIAGNOSIS — H34832 Tributary (branch) retinal vein occlusion, left eye, with macular edema: Secondary | ICD-10-CM

## 2020-08-20 DIAGNOSIS — H353131 Nonexudative age-related macular degeneration, bilateral, early dry stage: Secondary | ICD-10-CM

## 2020-08-20 DIAGNOSIS — I10 Essential (primary) hypertension: Secondary | ICD-10-CM

## 2020-08-20 MED ORDER — BEVACIZUMAB CHEMO INJECTION 1.25MG/0.05ML SYRINGE FOR KALEIDOSCOPE
1.2500 mg | INTRAVITREAL | Status: AC | PRN
  Administered 2020-08-20: 1.25 mg via INTRAVITREAL

## 2020-08-21 NOTE — Telephone Encounter (Signed)
Has there been any word on who's taking over the Prolia scheduling?

## 2020-08-24 ENCOUNTER — Telehealth: Payer: Self-pay

## 2020-08-24 NOTE — Telephone Encounter (Signed)
Patient has been scheduled for Porlia injection

## 2020-08-24 NOTE — Telephone Encounter (Signed)
T, please see below. Ty, C

## 2020-08-24 NOTE — Telephone Encounter (Signed)
PA not required for prolia at this time. Pt can be scheduled at her convenience.

## 2020-08-25 NOTE — Telephone Encounter (Signed)
Did you get this scheduled already or should it be passed to admin to schedule?

## 2020-08-27 NOTE — Progress Notes (Signed)
[facility] Note  08/29/2020     CHIEF COMPLAINT Patient presents for Retina Follow Up   HISTORY OF PRESENT ILLNESS: Janice Small is a 87 y.o. female who presents to the clinic today for:   HPI    Retina Follow Up    Patient presents with  CRVO/BRVO.  In left eye.  Duration of 9 days.  Since onset it is stable.  I, the attending physician,  performed the HPI with the patient and updated documentation appropriately.          Comments    9 day follow up BRVO OS- Vision stable OU.  She has trouble with the newspaper but no change since last visit.        Last edited by Bernarda Caffey, MD on 08/29/2020 11:11 AM. (History)    pt here for focal laser  Referring physician: Isaac Bliss, Rayford Halsted, MD [place],  [place] [address]  HISTORICAL INFORMATION:   Selected notes from the MEDICAL RECORD NUMBER Referred by Dr. Jerline Pain for eval of BRVO OS LEE: 10.12.2021 Ocular Hx- Glc suspect PMH-    CURRENT MEDICATIONS: Current Outpatient Medications (Ophthalmic Drugs)  Medication Sig  . prednisoLONE acetate (PRED FORTE) 1 % ophthalmic suspension Place 1 drop into the left eye 4 (four) times daily for 7 days.   No current facility-administered medications for this visit. (Ophthalmic Drugs)   Current Outpatient Medications (Other)  Medication Sig  . furosemide (LASIX) 20 MG tablet Take 1 tablet (20 mg total) by mouth daily.  Marland Kitchen lisinopril (ZESTRIL) 40 MG tablet TAKE 1 TABLET BY MOUTH  DAILY  . metroNIDAZOLE (METROGEL) 0.75 % gel Apply 1 application topically daily as needed.  Marland Kitchen SYNTHROID 75 MCG tablet TAKE 1 TABLET BY MOUTH  DAILY   No current facility-administered medications for this visit. (Other)      REVIEW OF SYSTEMS: ROS    Positive for: Gastrointestinal, Musculoskeletal, Eyes   Negative for: Constitutional, Neurological, Skin, Genitourinary, HENT, Endocrine, Cardiovascular, Respiratory, Psychiatric, Allergic/Imm,  Heme/Lymph   Last edited by Leonie Douglas, COA on 08/29/2020 10:00 AM. (History)       ALLERGIES Allergies  Allergen Reactions  . Codeine Phosphate     REACTION: unspecified    PAST MEDICAL HISTORY Past Medical History:  Diagnosis Date  . BREAST BIOPSY, HX OF 11/27/2006  . COLONIC POLYPS, HX OF 11/27/2006  . HEMORRHOIDS 01/17/2010  . HYPERTENSION 11/27/2006  . Hypertensive retinopathy    OU  . HYPOTHYROIDISM 11/27/2006  . Macular degeneration    Dry OU  . MENOPAUSAL SYNDROME 11/27/2006   Past Surgical History:  Procedure Laterality Date  . ABDOMINAL HYSTERECTOMY    . APPENDECTOMY    . BREAST EXCISIONAL BIOPSY Right 1987  . BREAST SURGERY     bx  . CATARACT EXTRACTION Bilateral   . EYE SURGERY Bilateral    Cat Sx OU  . TONSILLECTOMY      FAMILY HISTORY Family History  Problem Relation Age of Onset  . Heart disease Father     SOCIAL HISTORY Social History   Tobacco Use  . Smoking status: Never Smoker  . Smokeless tobacco: Never Used  Substance Use Topics  . Alcohol use: Yes    Comment: rarely  . Drug use: No         OPHTHALMIC EXAM:  Base Eye Exam    Visual Acuity (Snellen - Linear)      Right Left   Dist cc 20/20 -  2 20/30 -2   Dist ph cc  NI   Correction: Glasses       Tonometry (Tonopen, 10:05 AM)      Right Left   Pressure 16 15       Pupils      Dark Light Shape React APD   Right 3 2 Round Brisk None   Left 3 2 Round Brisk None       Visual Fields (Counting fingers)      Left Right    Full Full       Extraocular Movement      Right Left    Full Full       Neuro/Psych    Oriented x3: Yes   Mood/Affect: Normal       Dilation    Both eyes: 1.0% Mydriacyl, 2.5% Phenylephrine @ 10:05 AM        Slit Lamp and Fundus Exam    External Exam      Right Left   External  Ecchymosis lateral canthes        Slit Lamp Exam      Right Left   Lids/Lashes Dermatochalasis - upper lid Dermatochalasis - upper lid, mild Meibomian gland  dysfunction   Conjunctiva/Sclera White and quiet White and quiet   Cornea 2-3+ inferior Punctate epithelial erosions, well healed temporal cataract wounds, arcus 2-3+ inferior Punctate epithelial erosions, well healed temporal cataract wounds, arcus   Anterior Chamber Deep and quiet Deep and quiet   Iris Round and dilated Round and dilated   Lens 3 piece PC IOL in good position with open PC 3 piece PC IOL in good position, 1+PCO (non-central)   Vitreous Vitreous syneresis Vitreous syneresis, Posterior vitreous detachment       Fundus Exam      Right Left   Disc Pink and Sharp, Compact mild Pallor, Sharp rim, temporal PPA, Compact   C/D Ratio 0.3 0.2   Macula Flat, Blunted foveal reflex, mild Drusen, RPE mottling and clumping, no heme Blunted foveal reflex, scattered IRH/DBH -- improved, good targets for focal laser, focal edema temporal macula -- persistent -- minimal improvement, Drusen   Vessels attenuated, Tortuous attenuated, Tortuous, severe attenuation of vessels superior macula   Periphery Attached, No heme  Attached, mild reticular degeneration          IMAGING AND PROCEDURES  Imaging and Procedures for 08/29/2020  OCT, Retina - OU - Both Eyes       Right Eye Quality was good. Central Foveal Thickness: 270. Progression has been stable. Findings include normal foveal contour, no IRF, no SRF, retinal drusen .   Left Eye Quality was good. Central Foveal Thickness: 288. Progression has improved. Findings include normal foveal contour, intraretinal fluid, intraretinal hyper-reflective material, retinal drusen , no SRF (Mild improvement in IRF temporal macula).   Notes *Images captured and stored on drive  Diagnosis / Impression:  OD: non-exu ARMD w/ fine drusen OS: BRVO w/ CME -- Mild improvement in IRF temporal macula  Clinical management:  See below  Abbreviations: NFP - Normal foveal profile. CME - cystoid macular edema. PED - pigment epithelial detachment. IRF -  intraretinal fluid. SRF - subretinal fluid. EZ - ellipsoid zone. ERM - epiretinal membrane. ORA - outer retinal atrophy. ORT - outer retinal tubulation. SRHM - subretinal hyper-reflective material. IRHM - intraretinal hyper-reflective material        Focal Laser - OS - Left Eye       LASER PROCEDURE NOTE  Diagnosis:  BRVO w/ macular edema, LEFT EYE  Procedure:  Focal laser photocoagulation using slit lamp laser, LEFT EYE  Anesthesia:  Topical  Surgeon: Bernarda Caffey, MD, PhD   Informed consent obtained, operative eye marked, and time out performed prior to initiation of laser.   Lumenis [id number] Focal/Grid laser Power: 110-120 mW Duration: 50 msec  Spot size: 100 microns  # spots:  69 spots placed to focal edema / heme temporal to fovea  Complications: None.  RTC: Mar 7 or later -- DFE/OCT, possible injxn  Patient tolerated the procedure well and received written and verbal post-procedure care information/education.                   ASSESSMENT/PLAN:    ICD-10-CM   1. Branch retinal vein occlusion of left eye with macular edema  H34.8320 Focal Laser - OS - Left Eye  2. Essential hypertension  I10   3. Retinal edema  H35.81 OCT, Retina - OU - Both Eyes  4. Hypertensive retinopathy of both eyes  H35.033   5. Early dry stage nonexudative age-related macular degeneration of both eyes  H35.3131   6. Pseudophakia, both eyes  Z96.1   7. Glaucoma suspect of both eyes  H40.003     1,2. BRVO w/ CME OS             - S/P IVA OS #1 (10.18.21), #2 (11.15.21), #3 (12.13.21), #4 (01.10.22), #5 (02.07.22) - BCVA 20/30 -- stable - OCT shows mild improvement in IRF temporal macula -- ?IVA resistance - discussed possible switch in medication and possible focal laser - recommend focal laser OS today, 02.16.22 - pt wishes to proceed - RBA of procedure discussed, questions answered - informed consent obtained and signed - see procedure note - start PF QID OS x 7d -  Avastin informed consent obtained and signed, 10.18.21 (OS) - F/U March 7 or later, DFE, OCT, possible injection   3,4. Hypertensive retinopathy OU - discussed importance of tight BP control - monitor  5. Age related macular degeneration, non-exudative, both eyes  - The incidence, anatomy, and pathology of dry AMD, risk of progression, and the AREDS and AREDS 2 study including smoking risks discussed with patient.  - Recommend amsler grid monitoring  - f/u in 3 months, DFE, OCT  6. Pseudophakia OU  - s/p CE/IOL  - IOL in good position, doing well  - monitor  7. Glaucoma Suspect  - IOP 14,15 OU  - under the expert care of Dr. Jerline Pain  Ophthalmic Meds Ordered this visit:  Meds ordered this encounter  Medications  . prednisoLONE acetate (PRED FORTE) 1 % ophthalmic suspension    Sig: Place 1 drop into the left eye 4 (four) times daily for 7 days.    Dispense:  10 mL    Refill:  0       Return for f/u march 7 or later, BRVO OS, DFE, OCT.  There are no Patient Instructions on file for this visit.  This document serves as a record of services personally performed by Gardiner Sleeper, MD, PhD. It was created on their behalf by Roselee Nova, COMT. The creation of this record is the provider's dictation and/or activities during the visit.  Electronically signed by: Roselee Nova, COMT 08/29/20 11:28 AM   This document serves as a record of services personally performed by Gardiner Sleeper, MD, PhD. It was created on their behalf by San Jetty. Owens Shark, OA an ophthalmic technician. The creation of this record  is the provider's dictation and/or activities during the visit.    Electronically signed by: San Jetty. Owens Shark, [place] 02.16.2022 11:28 AM  Gardiner Sleeper, M.D., Ph.D. Diseases & Surgery of the Retina and Vitreous [facility]  I have reviewed the above documentation for accuracy and completeness, and I agree with the above. Gardiner Sleeper, M.D., Ph.D. 08/29/20  11:28 AM  Abbreviations: M myopia (nearsighted); A astigmatism; H hyperopia (farsighted); P presbyopia; Mrx spectacle prescription;  CTL contact lenses; OD right eye; OS left eye; OU both eyes  XT exotropia; ET esotropia; PEK punctate epithelial keratitis; PEE punctate epithelial erosions; DES dry eye syndrome; MGD meibomian gland dysfunction; ATs artificial tears; PFAT's preservative free artificial tears; [place] nuclear sclerotic cataract; PSC posterior subcapsular cataract; ERM epi-retinal membrane; PVD posterior vitreous detachment; RD retinal detachment; DM diabetes mellitus; DR diabetic retinopathy; NPDR non-proliferative diabetic retinopathy; PDR proliferative diabetic retinopathy; CSME clinically significant macular edema; DME diabetic macular edema; dbh dot blot hemorrhages; CWS cotton wool spot; POAG primary open angle glaucoma; C/D cup-to-disc ratio; HVF humphrey visual field; GVF goldmann visual field; OCT optical coherence tomography; IOP intraocular pressure; BRVO Branch retinal vein occlusion; CRVO central retinal vein occlusion; CRAO central retinal artery occlusion; BRAO branch retinal artery occlusion; RT retinal tear; SB scleral buckle; PPV pars plana vitrectomy; VH Vitreous hemorrhage; PRP panretinal laser photocoagulation; IVK intravitreal kenalog; VMT vitreomacular traction; MH Macular hole;  NVD neovascularization of the disc; NVE neovascularization elsewhere; AREDS age related eye disease study; ARMD age related macular degeneration; POAG primary open angle glaucoma; EBMD epithelial/anterior basement membrane dystrophy; ACIOL anterior chamber intraocular lens; IOL intraocular lens; PCIOL posterior chamber intraocular lens; Phaco/IOL phacoemulsification with intraocular lens placement; [place] photorefractive keratectomy; LASIK laser assisted in situ keratomileusis; HTN hypertension; DM diabetes mellitus; COPD chronic obstructive pulmonary disease

## 2020-08-29 ENCOUNTER — Other Ambulatory Visit: Payer: Self-pay

## 2020-08-29 ENCOUNTER — Ambulatory Visit (INDEPENDENT_AMBULATORY_CARE_PROVIDER_SITE_OTHER): Payer: Medicare Other | Admitting: Ophthalmology

## 2020-08-29 ENCOUNTER — Encounter (INDEPENDENT_AMBULATORY_CARE_PROVIDER_SITE_OTHER): Payer: Self-pay | Admitting: Ophthalmology

## 2020-08-29 DIAGNOSIS — Z961 Presence of intraocular lens: Secondary | ICD-10-CM

## 2020-08-29 DIAGNOSIS — H34832 Tributary (branch) retinal vein occlusion, left eye, with macular edema: Secondary | ICD-10-CM

## 2020-08-29 DIAGNOSIS — H35033 Hypertensive retinopathy, bilateral: Secondary | ICD-10-CM | POA: Diagnosis not present

## 2020-08-29 DIAGNOSIS — I10 Essential (primary) hypertension: Secondary | ICD-10-CM | POA: Diagnosis not present

## 2020-08-29 DIAGNOSIS — H353131 Nonexudative age-related macular degeneration, bilateral, early dry stage: Secondary | ICD-10-CM

## 2020-08-29 DIAGNOSIS — H40003 Preglaucoma, unspecified, bilateral: Secondary | ICD-10-CM

## 2020-08-29 DIAGNOSIS — H3581 Retinal edema: Secondary | ICD-10-CM

## 2020-08-29 MED ORDER — PREDNISOLONE ACETATE 1 % OP SUSP: 1.0000 [drp] | Freq: Four times a day (QID) | OPHTHALMIC | 0 refills | Status: AC

## 2020-08-31 ENCOUNTER — Ambulatory Visit (INDEPENDENT_AMBULATORY_CARE_PROVIDER_SITE_OTHER): Payer: Medicare Other

## 2020-08-31 ENCOUNTER — Other Ambulatory Visit: Payer: Self-pay

## 2020-08-31 DIAGNOSIS — M818 Other osteoporosis without current pathological fracture: Secondary | ICD-10-CM

## 2020-08-31 MED ORDER — DENOSUMAB 60 MG/ML ~~LOC~~ SOSY
60.0000 mg | PREFILLED_SYRINGE | Freq: Once | SUBCUTANEOUS | Status: AC
  Administered 2020-08-31: 60 mg via SUBCUTANEOUS

## 2020-08-31 NOTE — Progress Notes (Signed)
Prolia injection administered. Pt tolerated well 

## 2020-09-13 NOTE — Progress Notes (Signed)
[facility] Note  09/17/2020     CHIEF COMPLAINT Patient presents for Retina Follow Up   HISTORY OF PRESENT ILLNESS: Janice Small is a 87 y.o. female who presents to the clinic today for:   HPI    Retina Follow Up    Patient presents with  CRVO/BRVO.  In left eye.  This started 3 weeks ago.  I, the attending physician,  performed the HPI with the patient and updated documentation appropriately.          Comments    Patient here for 3 weeks retina follow up for post op laser/BRVO OS. Patient states vision about the same. No eye pain.        Last edited by Bernarda Caffey, MD on 09/17/2020  8:57 PM. (History)      Referring physician: Isaac Bliss, Rayford Halsted, MD [place],  [place] [address]  HISTORICAL INFORMATION:   Selected notes from the MEDICAL RECORD NUMBER Referred by Dr. Jerline Pain for eval of BRVO OS LEE: 10.12.2021 Ocular Hx- Glc suspect   CURRENT MEDICATIONS: No current outpatient medications on file. (Ophthalmic Drugs)   No current facility-administered medications for this visit. (Ophthalmic Drugs)   Current Outpatient Medications (Other)  Medication Sig  . furosemide (LASIX) 20 MG tablet Take 1 tablet (20 mg total) by mouth daily.  Marland Kitchen lisinopril (ZESTRIL) 40 MG tablet TAKE 1 TABLET BY MOUTH  DAILY  . metroNIDAZOLE (METROGEL) 0.75 % gel Apply 1 application topically daily as needed.  Marland Kitchen SYNTHROID 75 MCG tablet TAKE 1 TABLET BY MOUTH  DAILY   No current facility-administered medications for this visit. (Other)      REVIEW OF SYSTEMS: ROS    Positive for: Gastrointestinal, Musculoskeletal, Eyes   Negative for: Constitutional, Neurological, Skin, Genitourinary, HENT, Endocrine, Cardiovascular, Respiratory, Psychiatric, Allergic/Imm, Heme/Lymph   Last edited by Theodore Demark, COA on 09/17/2020  9:56 AM. (History)       ALLERGIES Allergies  Allergen Reactions  . Codeine Phosphate     REACTION:  unspecified    PAST MEDICAL HISTORY Past Medical History:  Diagnosis Date  . BREAST BIOPSY, HX OF 11/27/2006  . COLONIC POLYPS, HX OF 11/27/2006  . HEMORRHOIDS 01/17/2010  . HYPERTENSION 11/27/2006  . Hypertensive retinopathy    OU  . HYPOTHYROIDISM 11/27/2006  . Macular degeneration    Dry OU  . MENOPAUSAL SYNDROME 11/27/2006   Past Surgical History:  Procedure Laterality Date  . ABDOMINAL HYSTERECTOMY    . APPENDECTOMY    . BREAST EXCISIONAL BIOPSY Right 1987  . BREAST SURGERY     bx  . CATARACT EXTRACTION Bilateral   . EYE SURGERY Bilateral    Cat Sx OU  . TONSILLECTOMY      FAMILY HISTORY Family History  Problem Relation Age of Onset  . Heart disease Father     SOCIAL HISTORY Social History   Tobacco Use  . Smoking status: Never Smoker  . Smokeless tobacco: Never Used  Substance Use Topics  . Alcohol use: Yes    Comment: rarely  . Drug use: No         OPHTHALMIC EXAM:  Base Eye Exam    Visual Acuity (Snellen - Linear)      Right Left   Dist cc 20/20 -2 20/30 +1   Dist ph cc  NI   Correction: Glasses       Tonometry (Tonopen, 9:53 AM)      Right Left  Pressure 22 22       Pupils      Dark Light Shape React APD   Right 3 2 Round Brisk None   Left 3 2 Round Brisk None       Visual Fields (Counting fingers)      Left Right    Full Full       Extraocular Movement      Right Left    Full Full       Neuro/Psych    Oriented x3: Yes   Mood/Affect: Normal       Dilation    Both eyes: 1.0% Mydriacyl, 2.5% Phenylephrine @ 9:53 AM        Slit Lamp and Fundus Exam    Slit Lamp Exam      Right Left   Lids/Lashes Dermatochalasis - upper lid Dermatochalasis - upper lid, mild Meibomian gland dysfunction   Conjunctiva/Sclera White and quiet White and quiet   Cornea 2-3+ inferior Punctate epithelial erosions, well healed temporal cataract wounds, arcus 2-3+ inferior Punctate epithelial erosions, well healed temporal cataract wounds, arcus    Anterior Chamber Deep and quiet Deep and quiet   Iris Round and dilated Round and dilated   Lens 3 piece PC IOL in good position with open PC 3 piece PC IOL in good position, 1+PCO (non-central)   Vitreous Vitreous syneresis Vitreous syneresis, Posterior vitreous detachment       Fundus Exam      Right Left   Disc Pink and Sharp, Compact mild Pallor, Sharp rim, temporal PPA, Compact   C/D Ratio 0.3 0.2   Macula Flat, Blunted foveal reflex, mild Drusen, RPE mottling and clumping, no heme Blunted foveal reflex, scattered IRH/DBH -- improved, early focal laser changes, focal edema temporal macula -- persistent, Drusen   Vessels attenuated, Tortuous attenuated, Tortuous, severe attenuation of vessels superior macula   Periphery Attached, No heme  Attached, mild reticular degeneration        Refraction    Wearing Rx      Sphere Cylinder Axis Add   Right -1.00 +0.50 027 +2.50   Left -0.75 +0.75 083 +2.50   Type: progressive          IMAGING AND PROCEDURES  Imaging and Procedures for 09/17/2020  OCT, Retina - OU - Both Eyes       Right Eye Quality was good. Central Foveal Thickness: 270. Progression has been stable. Findings include normal foveal contour, no IRF, no SRF, retinal drusen .   Left Eye Quality was good. Central Foveal Thickness: 288. Progression has been stable. Findings include normal foveal contour, intraretinal fluid, intraretinal hyper-reflective material, retinal drusen , no SRF (Peristent IRF/IRHM temporal macula; new ORA from focal laser.).   Notes *Images captured and stored on drive  Diagnosis / Impression:  OD: non-exu ARMD w/ fine drusen OS: BRVO w/ CME -- Mild improvement in IRF temporal macula  Clinical management:  See below  Abbreviations: NFP - Normal foveal profile. CME - cystoid macular edema. PED - pigment epithelial detachment. IRF - intraretinal fluid. SRF - subretinal fluid. EZ - ellipsoid zone. ERM - epiretinal membrane. ORA - outer retinal  atrophy. ORT - outer retinal tubulation. SRHM - subretinal hyper-reflective material. IRHM - intraretinal hyper-reflective material        Intravitreal Injection, Pharmacologic Agent - OS - Left Eye       Time Out 09/17/2020. 11:02 AM. Confirmed correct patient, procedure, site, and patient consented.   Anesthesia Topical anesthesia was  used. Anesthetic medications included Lidocaine 2%, Proparacaine 0.5%.   Procedure Preparation included 5% betadine to ocular surface, eyelid speculum. A supplied (32g ) needle was used.   Injection:  1.25 mg Bevacizumab (AVASTIN) 1.25mg /0.61mL SOLN   NDC: 40981-191-47, Lot: [id number] , Expiration date: 11/05/2020   Route: Intravitreal, Site: Left Eye, Waste: 0 mL  Post-op Post injection exam found visual acuity of at least counting fingers. The patient tolerated the procedure well. There were no complications. The patient received written and verbal post procedure care education. Post injection medications were not given.                 ASSESSMENT/PLAN:    ICD-10-CM   1. Branch retinal vein occlusion of left eye with macular edema  H34.8320 Intravitreal Injection, Pharmacologic Agent - OS - Left Eye    Bevacizumab (AVASTIN) SOLN 1.25 mg  2. Essential hypertension  I10   3. Retinal edema  H35.81 OCT, Retina - OU - Both Eyes  4. Hypertensive retinopathy of both eyes  H35.033   5. Early dry stage nonexudative age-related macular degeneration of both eyes  H35.3131   6. Pseudophakia, both eyes  Z96.1   7. Glaucoma suspect of both eyes  H40.003     1,2. BRVO w/ CME OS             - S/P IVA OS #1 (10.18.21), #2 (11.15.21), #3 (12.13.21), #4 (01.10.22), #5 (02.07.22)             - S/P focal laser OS (02.16.22) - BCVA 20/30 -- stable - OCT shows peristent IRF/IRHM temporal macula; new ORA from focal laser. - recommend IVA OS #6 today, 03.07.22) - pt wishes to proceed - RBA of procedure discussed, questions answered - informed consent  obtained and signed - see procedure note - Avastin informed consent obtained and signed, 10.18.21 (OS) - F/U 4 weeks, DFE, OCT, possible injection   3,4. Hypertensive retinopathy OU - discussed importance of tight BP control - monitor  5. Age related macular degeneration, non-exudative, both eyes  - The incidence, anatomy, and pathology of dry AMD, risk of progression, and the AREDS and AREDS 2 study including smoking risks discussed with patient.  - Recommend amsler grid monitoring  6. Pseudophakia OU  - s/p CE/IOL  - IOL in good position, doing well  - monitor  7. Glaucoma Suspect  - IOP 22 OU  - under the expert care of Dr. 10.31.21  Ophthalmic Meds Ordered this visit:  Meds ordered this encounter  Medications  . Bevacizumab (AVASTIN) SOLN 1.25 mg       Return in about 4 weeks (around 10/15/2020) for 4 wk f/u for BRVO w/CME OS w/DFE/OCT/likely inj..  There are no Patient Instructions on file for this visit.  This document serves as a record of services personally performed by 17/10/2020, MD, PhD. It was created on their behalf by Gardiner Sleeper, [place], an ophthalmic technician. The creation of this record is the provider's dictation and/or activities during the visit.    Electronically signed by: [address], COA @TODAY @ 9:01 PM   This document serves as a record of services personally performed by Leeann Must, MD, PhD. It was created on their behalf by Gardiner Sleeper, COT an ophthalmic technician. The creation of this record is the provider's dictation and/or activities during the visit.    Electronically signed by: Estill Bakes, COT 3.7.22 @ 9:01 PM  16.7.22, M.D., Ph.D. Diseases & Surgery of the Retina  and Vitreous [facility] 09/17/2020   I have reviewed the above documentation for accuracy and completeness, and I agree with the above. Gardiner Sleeper, M.D., Ph.D. 09/17/20 9:01 PM  Abbreviations: M myopia (nearsighted); A  astigmatism; H hyperopia (farsighted); P presbyopia; Mrx spectacle prescription;  CTL contact lenses; OD right eye; OS left eye; OU both eyes  XT exotropia; ET esotropia; PEK punctate epithelial keratitis; PEE punctate epithelial erosions; DES dry eye syndrome; MGD meibomian gland dysfunction; ATs artificial tears; PFAT's preservative free artificial tears; [place] nuclear sclerotic cataract; PSC posterior subcapsular cataract; ERM epi-retinal membrane; PVD posterior vitreous detachment; RD retinal detachment; DM diabetes mellitus; DR diabetic retinopathy; NPDR non-proliferative diabetic retinopathy; PDR proliferative diabetic retinopathy; CSME clinically significant macular edema; DME diabetic macular edema; dbh dot blot hemorrhages; CWS cotton wool spot; POAG primary open angle glaucoma; C/D cup-to-disc ratio; HVF humphrey visual field; GVF goldmann visual field; OCT optical coherence tomography; IOP intraocular pressure; BRVO Branch retinal vein occlusion; CRVO central retinal vein occlusion; CRAO central retinal artery occlusion; BRAO branch retinal artery occlusion; RT retinal tear; SB scleral buckle; PPV pars plana vitrectomy; VH Vitreous hemorrhage; PRP panretinal laser photocoagulation; IVK intravitreal kenalog; VMT vitreomacular traction; MH Macular hole;  NVD neovascularization of the disc; NVE neovascularization elsewhere; AREDS age related eye disease study; ARMD age related macular degeneration; POAG primary open angle glaucoma; EBMD epithelial/anterior basement membrane dystrophy; ACIOL anterior chamber intraocular lens; IOL intraocular lens; PCIOL posterior chamber intraocular lens; Phaco/IOL phacoemulsification with intraocular lens placement; [place] photorefractive keratectomy; LASIK laser assisted in situ keratomileusis; HTN hypertension; DM diabetes mellitus; COPD chronic obstructive pulmonary disease

## 2020-09-17 ENCOUNTER — Encounter (INDEPENDENT_AMBULATORY_CARE_PROVIDER_SITE_OTHER): Payer: Self-pay | Admitting: Ophthalmology

## 2020-09-17 ENCOUNTER — Other Ambulatory Visit: Payer: Self-pay

## 2020-09-17 ENCOUNTER — Ambulatory Visit (INDEPENDENT_AMBULATORY_CARE_PROVIDER_SITE_OTHER): Payer: Medicare Other | Admitting: Ophthalmology

## 2020-09-17 DIAGNOSIS — H40003 Preglaucoma, unspecified, bilateral: Secondary | ICD-10-CM

## 2020-09-17 DIAGNOSIS — H3581 Retinal edema: Secondary | ICD-10-CM

## 2020-09-17 DIAGNOSIS — I10 Essential (primary) hypertension: Secondary | ICD-10-CM

## 2020-09-17 DIAGNOSIS — H34832 Tributary (branch) retinal vein occlusion, left eye, with macular edema: Secondary | ICD-10-CM | POA: Diagnosis not present

## 2020-09-17 DIAGNOSIS — Z961 Presence of intraocular lens: Secondary | ICD-10-CM

## 2020-09-17 DIAGNOSIS — H35033 Hypertensive retinopathy, bilateral: Secondary | ICD-10-CM

## 2020-09-17 DIAGNOSIS — H353131 Nonexudative age-related macular degeneration, bilateral, early dry stage: Secondary | ICD-10-CM

## 2020-09-17 MED ORDER — BEVACIZUMAB CHEMO INJECTION 1.25MG/0.05ML SYRINGE FOR KALEIDOSCOPE
1.2500 mg | INTRAVITREAL | Status: AC | PRN
  Administered 2020-09-17: 1.25 mg via INTRAVITREAL

## 2020-10-11 NOTE — Progress Notes (Signed)
[facility] Note  10/15/2020     CHIEF COMPLAINT Patient presents for Retina Follow Up   HISTORY OF PRESENT ILLNESS: Janice Small is a 87 y.o. female who presents to the clinic today for:   HPI    Retina Follow Up    Patient presents with  CRVO/BRVO.  In left eye.  This started weeks ago.  Severity is moderate.  Duration of weeks.  Since onset it is stable.  I, the attending physician,  performed the HPI with the patient and updated documentation appropriately.          Comments    Pt states vision is the same OU.  Pt denies eye pain or discomfort and denies any new or worsening floaters or fol OU.       Last edited by Bernarda Caffey, MD on 10/15/2020  1:03 PM. (History)      Referring physician: Isaac Bliss, Rayford Halsted, MD [place],  [place] [id number]  HISTORICAL INFORMATION:   Selected notes from the MEDICAL RECORD NUMBER Referred by Dr. Jerline Pain for eval of BRVO OS LEE: 10.12.2021 Ocular Hx- Glc suspect   CURRENT MEDICATIONS: No current outpatient medications on file. (Ophthalmic Drugs)   No current facility-administered medications for this visit. (Ophthalmic Drugs)   Current Outpatient Medications (Other)  Medication Sig  . furosemide (LASIX) 20 MG tablet Take 1 tablet (20 mg total) by mouth daily.  Marland Kitchen lisinopril (ZESTRIL) 40 MG tablet TAKE 1 TABLET BY MOUTH  DAILY  . metroNIDAZOLE (METROGEL) 0.75 % gel Apply 1 application topically daily as needed.  Marland Kitchen SYNTHROID 75 MCG tablet TAKE 1 TABLET BY MOUTH  DAILY   No current facility-administered medications for this visit. (Other)      REVIEW OF SYSTEMS: ROS    Positive for: Gastrointestinal, Musculoskeletal, Eyes   Negative for: Constitutional, Neurological, Skin, Genitourinary, HENT, Endocrine, Cardiovascular, Respiratory, Psychiatric, Allergic/Imm, Heme/Lymph   Last edited by Doneen Poisson on 10/15/2020  9:37 AM. (History)       ALLERGIES Allergies   Allergen Reactions  . Codeine Phosphate     REACTION: unspecified    PAST MEDICAL HISTORY Past Medical History:  Diagnosis Date  . BREAST BIOPSY, HX OF 11/27/2006  . COLONIC POLYPS, HX OF 11/27/2006  . HEMORRHOIDS 01/17/2010  . HYPERTENSION 11/27/2006  . Hypertensive retinopathy    OU  . HYPOTHYROIDISM 11/27/2006  . Macular degeneration    Dry OU  . MENOPAUSAL SYNDROME 11/27/2006   Past Surgical History:  Procedure Laterality Date  . ABDOMINAL HYSTERECTOMY    . APPENDECTOMY    . BREAST EXCISIONAL BIOPSY Right 1987  . BREAST SURGERY     bx  . CATARACT EXTRACTION Bilateral   . EYE SURGERY Bilateral    Cat Sx OU  . TONSILLECTOMY      FAMILY HISTORY Family History  Problem Relation Age of Onset  . Heart disease Father     SOCIAL HISTORY Social History   Tobacco Use  . Smoking status: Never Smoker  . Smokeless tobacco: Never Used  Substance Use Topics  . Alcohol use: Yes    Comment: rarely  . Drug use: No         OPHTHALMIC EXAM:  Base Eye Exam    Visual Acuity (Snellen - Linear)      Right Left   Dist cc 20/20 -2 20/30 -1   Dist ph cc  NI   Correction: Glasses  Tonometry (Tonopen, 9:45 AM)      Right Left   Pressure 17 19       Pupils      Dark Light Shape React APD   Right 3 2 Round Brisk 0   Left 3 2 Round Brisk 0       Visual Fields      Left Right    Full Full       Extraocular Movement      Right Left    Full Full       Neuro/Psych    Oriented x3: Yes   Mood/Affect: Normal       Dilation    Both eyes: 1.0% Mydriacyl, 2.5% Phenylephrine @ 9:45 AM        Slit Lamp and Fundus Exam    Slit Lamp Exam      Right Left   Lids/Lashes Dermatochalasis - upper lid Dermatochalasis - upper lid, mild Meibomian gland dysfunction   Conjunctiva/Sclera White and quiet White and quiet   Cornea 2-3+ inferior Punctate epithelial erosions, well healed temporal cataract wounds, arcus 2-3+ inferior Punctate epithelial erosions, well healed  temporal cataract wounds, arcus   Anterior Chamber Deep and quiet Deep and quiet   Iris Round and dilated Round and dilated   Lens 3 piece PC IOL in good position with open PC 3 piece PC IOL in good position, 1+PCO (non-central)   Vitreous Vitreous syneresis, Posterior vitreous detachment Vitreous syneresis, Posterior vitreous detachment       Fundus Exam      Right Left   Disc Pink and Sharp, Compact mild Pallor, Sharp rim, temporal PPA, Compact   C/D Ratio 0.3 0.2   Macula Flat, Blunted foveal reflex, mild Drusen, RPE mottling and clumping, no heme Blunted foveal reflex, scattered IRH/DBH -- improved, +laser changes, focal edema temporal macula -- persistent, Drusen, +focal exudates temporal mac   Vessels attenuated, Tortuous attenuated, Tortuous, severe attenuation of vessels superior macula   Periphery Attached, No heme  Attached, mild reticular degeneration        Refraction    Wearing Rx      Sphere Cylinder Axis Add   Right -1.00 +0.50 027 +2.50   Left -0.75 +0.75 083 +2.50   Type: progressive          IMAGING AND PROCEDURES  Imaging and Procedures for 10/15/2020  OCT, Retina - OU - Both Eyes       Right Eye Quality was good. Central Foveal Thickness: 271. Progression has been stable. Findings include normal foveal contour, no IRF, no SRF, retinal drusen .   Left Eye Quality was good. Central Foveal Thickness: 284. Progression has improved. Findings include normal foveal contour, intraretinal fluid, intraretinal hyper-reflective material, retinal drusen , no SRF (Mild interval improvement in IRF, +IRHM).   Notes *Images captured and stored on drive  Diagnosis / Impression:  OD: non-exu ARMD w/ fine drusen OS: BRVO w/ CME -- Mild interval improvement in IRF, +IRHM  Clinical management:  See below  Abbreviations: NFP - Normal foveal profile. CME - cystoid macular edema. PED - pigment epithelial detachment. IRF - intraretinal fluid. SRF - subretinal fluid. EZ -  ellipsoid zone. ERM - epiretinal membrane. ORA - outer retinal atrophy. ORT - outer retinal tubulation. SRHM - subretinal hyper-reflective material. IRHM - intraretinal hyper-reflective material        Intravitreal Injection, Pharmacologic Agent - OS - Left Eye       Time Out 10/15/2020. 10:07 AM. Confirmed correct  patient, procedure, site, and patient consented.   Anesthesia Topical anesthesia was used. Anesthetic medications included Lidocaine 2%, Proparacaine 0.5%.   Procedure Preparation included 5% betadine to ocular surface, eyelid speculum. A supplied (32g ) needle was used.   Injection:  1.25 mg Bevacizumab (AVASTIN) 1.2m/0.05mL SOLN   NDC: 537106-269-48 Lot: [id number], Expiration date: 11/20/2020   Route: Intravitreal, Site: Left Eye, Waste: 0.05 mL  Post-op Post injection exam found visual acuity of at least counting fingers. The patient tolerated the procedure well. There were no complications. The patient received written and verbal post procedure care education. Post injection medications were not given.                 ASSESSMENT/PLAN:    ICD-10-CM   1. Branch retinal vein occlusion of left eye with macular edema  H34.8320 Intravitreal Injection, Pharmacologic Agent - OS - Left Eye    Bevacizumab (AVASTIN) SOLN 1.25 mg  2. Essential hypertension  I10   3. Retinal edema  H35.81 OCT, Retina - OU - Both Eyes  4. Hypertensive retinopathy of both eyes  H35.033   5. Early dry stage nonexudative age-related macular degeneration of both eyes  H35.3131   6. Pseudophakia, both eyes  Z96.1   7. Glaucoma suspect of both eyes  H40.003     1,2. BRVO w/ CME OS             - S/P IVA OS #1 (10.18.21), #2 (11.15.21), #3 (12.13.21), #4 (01.10.22), #5 (02.07.22), #6 (03.07.22)             - S/P focal laser OS (02.16.22) - BCVA 20/30 -- stable - OCT shows peristent IRF/IRHM temporal macula; mild ORA from focal laser. - recommend IVA OS #7 today, (04.04.22) - pt wishes to  proceed - RBA of procedure discussed, questions answered - informed consent obtained and signed - see procedure note - Avastin informed consent obtained and signed, 10.18.21 (OS) - F/U 4 weeks, DFE, OCT, possible injection   3,4. Hypertensive retinopathy OU - discussed importance of tight BP control - monitor  5. Age related macular degeneration, non-exudative, both eyes  - The incidence, anatomy, and pathology of dry AMD, risk of progression, and the AREDS and AREDS 2 study including smoking risks discussed with patient.  - Recommend amsler grid monitoring  6. Pseudophakia OU  - s/p CE/IOL  - IOL in good position, doing well  - monitor  7. Glaucoma Suspect  - IOP 22 OU  - under the expert care of Dr. PJerline Pain Ophthalmic Meds Ordered this visit:  Meds ordered this encounter  Medications  . Bevacizumab (AVASTIN) SOLN 1.25 mg      Return in about 4 weeks (around 11/12/2020) for f/u BRVO OS, DFE, OCT.  There are no Patient Instructions on file for this visit.  This document serves as a record of services personally performed by BGardiner Sleeper MD, PhD. It was created on their behalf by ALeeann Must CGrinnell an ophthalmic technician. The creation of this record is the provider's dictation and/or activities during the visit.    Electronically signed by: ALeeann Must COA _0 @ 1:08 PM  BGardiner Sleeper M.D., Ph.D. Diseases & Surgery of the Retina and [place]04/10/2020   I have reviewed the above documentation for accuracy and completeness, and I agree with the above. BGardiner Sleeper M.D., Ph.D. 10/15/20 1:08 PM   Abbreviations: M myopia (nearsighted); A astigmatism; H hyperopia (farsighted); P presbyopia; Mrx  spectacle prescription;  CTL contact lenses; OD right eye; OS left eye; OU both eyes  XT exotropia; ET esotropia; PEK punctate epithelial keratitis; PEE punctate epithelial erosions; DES dry eye syndrome; MGD meibomian gland  dysfunction; ATs artificial tears; PFAT's preservative free artificial tears; [place] nuclear sclerotic cataract; PSC posterior subcapsular cataract; ERM epi-retinal membrane; PVD posterior vitreous detachment; RD retinal detachment; DM diabetes mellitus; DR diabetic retinopathy; NPDR non-proliferative diabetic retinopathy; PDR proliferative diabetic retinopathy; CSME clinically significant macular edema; DME diabetic macular edema; dbh dot blot hemorrhages; CWS cotton wool spot; POAG primary open angle glaucoma; C/D cup-to-disc ratio; HVF humphrey visual field; GVF goldmann visual field; OCT optical coherence tomography; IOP intraocular pressure; BRVO Branch retinal vein occlusion; CRVO central retinal vein occlusion; CRAO central retinal artery occlusion; BRAO branch retinal artery occlusion; RT retinal tear; SB scleral buckle; PPV pars plana vitrectomy; VH Vitreous hemorrhage; PRP panretinal laser photocoagulation; IVK intravitreal kenalog; VMT vitreomacular traction; MH Macular hole;  NVD neovascularization of the disc; NVE neovascularization elsewhere; AREDS age related eye disease study; ARMD age related macular degeneration; POAG primary open angle glaucoma; EBMD epithelial/anterior basement membrane dystrophy; ACIOL anterior chamber intraocular lens; IOL intraocular lens; PCIOL posterior chamber intraocular lens; Phaco/IOL phacoemulsification with intraocular lens placement; [place] photorefractive keratectomy; LASIK laser assisted in situ keratomileusis; HTN hypertension; DM diabetes mellitus; COPD chronic obstructive pulmonary disease

## 2020-10-15 ENCOUNTER — Other Ambulatory Visit: Payer: Self-pay

## 2020-10-15 ENCOUNTER — Ambulatory Visit (INDEPENDENT_AMBULATORY_CARE_PROVIDER_SITE_OTHER): Payer: Medicare Other | Admitting: Ophthalmology

## 2020-10-15 ENCOUNTER — Encounter (INDEPENDENT_AMBULATORY_CARE_PROVIDER_SITE_OTHER): Payer: Self-pay | Admitting: Ophthalmology

## 2020-10-15 DIAGNOSIS — H34832 Tributary (branch) retinal vein occlusion, left eye, with macular edema: Secondary | ICD-10-CM | POA: Diagnosis not present

## 2020-10-15 DIAGNOSIS — Z961 Presence of intraocular lens: Secondary | ICD-10-CM

## 2020-10-15 DIAGNOSIS — H35033 Hypertensive retinopathy, bilateral: Secondary | ICD-10-CM

## 2020-10-15 DIAGNOSIS — H3581 Retinal edema: Secondary | ICD-10-CM

## 2020-10-15 DIAGNOSIS — H40003 Preglaucoma, unspecified, bilateral: Secondary | ICD-10-CM

## 2020-10-15 DIAGNOSIS — I10 Essential (primary) hypertension: Secondary | ICD-10-CM | POA: Diagnosis not present

## 2020-10-15 DIAGNOSIS — H353131 Nonexudative age-related macular degeneration, bilateral, early dry stage: Secondary | ICD-10-CM

## 2020-10-15 MED ORDER — BEVACIZUMAB CHEMO INJECTION 1.25MG/0.05ML SYRINGE FOR KALEIDOSCOPE
1.2500 mg | INTRAVITREAL | Status: AC | PRN
  Administered 2020-10-15: 1.25 mg via INTRAVITREAL

## 2020-10-16 ENCOUNTER — Other Ambulatory Visit: Payer: Self-pay

## 2020-10-17 ENCOUNTER — Encounter: Payer: Self-pay | Admitting: Internal Medicine

## 2020-10-17 ENCOUNTER — Ambulatory Visit: Payer: Medicare Other | Admitting: Internal Medicine

## 2020-10-17 VITALS — BP 210/100 | HR 46 | Temp 98.1°F | Wt 150.7 lb

## 2020-10-17 DIAGNOSIS — I10 Essential (primary) hypertension: Secondary | ICD-10-CM

## 2020-10-17 MED ORDER — CLONIDINE HCL 0.1 MG PO TABS: 0.1000 mg | ORAL_TABLET | Freq: Two times a day (BID) | ORAL | 1 refills | Status: DC

## 2020-10-17 NOTE — Progress Notes (Signed)
Established Patient Office Visit     This visit occurred during the SARS-CoV-2 public health emergency.  Safety protocols were in place, including screening questions prior to the visit, additional usage of staff PPE, and extensive cleaning of exam room while observing appropriate contact time as indicated for disinfecting solutions.    CC/Reason for Visit: Blood pressure follow-up  HPI: Janice Small is a 87 y.o. female who is coming in today for the above mentioned reasons. Past Medical History is significant for: Uncontrolled hypertension, hypothyroidism, osteoporosis, hyperparathyroidism with hypercalcemia followed by endocrinology.  She was also recently diagnosed with hypertensive retinopathy and central retinal vein occlusion of the left eye.  She has been seeing a retina specialist frequently for this.  We have been having difficulty getting her blood pressure under control.  In office blood pressure today is 210/100.  She brings in her ambulatory measurements.  At home her systolics are high 606T to 016W with diastolics in the upper 10X to upper 90s.  She always has somewhat of a whitecoat hypertension element.  She is currently on lisinopril 40 mg and furosemide 20 mg.  She was taken off hydrochlorothiazide by endocrinologist due to her hypercalcemia.  I am hesitant to start her on rate controlling medications such as beta blockers due to her heart rate which is usually in the upper 40s to lower 50s (46 today).  She was tried on low-dose amlodipine but was unable to tolerate due to significant peripheral edema.  She denies chest pain, shortness of breath with or without exertion, headache, focal neurologic deficits.   Past Medical/Surgical History: Past Medical History:  Diagnosis Date  . BREAST BIOPSY, HX OF 11/27/2006  . COLONIC POLYPS, HX OF 11/27/2006  . HEMORRHOIDS 01/17/2010  . HYPERTENSION 11/27/2006  . Hypertensive retinopathy    OU  . HYPOTHYROIDISM 11/27/2006  . Macular  degeneration    Dry OU  . MENOPAUSAL SYNDROME 11/27/2006    Past Surgical History:  Procedure Laterality Date  . ABDOMINAL HYSTERECTOMY    . APPENDECTOMY    . BREAST EXCISIONAL BIOPSY Right 1987  . BREAST SURGERY     bx  . CATARACT EXTRACTION Bilateral   . EYE SURGERY Bilateral    Cat Sx OU  . TONSILLECTOMY      Social History:  reports that she has never smoked. She has never used smokeless tobacco. She reports current alcohol use. She reports that she does not use drugs.  Allergies: Allergies  Allergen Reactions  . Codeine Phosphate     REACTION: unspecified    Family History:  Family History  Problem Relation Age of Onset  . Heart disease Father      Current Outpatient Medications:  .  cloNIDine (CATAPRES) 0.1 MG tablet, Take 1 tablet (0.1 mg total) by mouth 2 (two) times daily., Disp: 180 tablet, Rfl: 1 .  furosemide (LASIX) 20 MG tablet, Take 1 tablet (20 mg total) by mouth daily., Disp: 90 tablet, Rfl: 3 .  lisinopril (ZESTRIL) 40 MG tablet, TAKE 1 TABLET BY MOUTH  DAILY, Disp: 90 tablet, Rfl: 1 .  metroNIDAZOLE (METROGEL) 0.75 % gel, Apply 1 application topically daily as needed., Disp: , Rfl:  .  SYNTHROID 75 MCG tablet, TAKE 1 TABLET BY MOUTH  DAILY, Disp: 90 tablet, Rfl: 3  Review of Systems:  Constitutional: Denies fever, chills, diaphoresis, appetite change and fatigue.  HEENT: Denies photophobia, eye pain, redness, hearing loss, ear pain, congestion, sore throat, rhinorrhea, sneezing, mouth sores, trouble swallowing,  neck pain, neck stiffness and tinnitus.   Respiratory: Denies SOB, DOE, cough, chest tightness,  and wheezing.   Cardiovascular: Denies chest pain, palpitations and leg swelling.  Gastrointestinal: Denies nausea, vomiting, abdominal pain, diarrhea, constipation, blood in stool and abdominal distention.  Genitourinary: Denies dysuria, urgency, frequency, hematuria, flank pain and difficulty urinating.  Endocrine: Denies: hot or cold  intolerance, sweats, changes in hair or nails, polyuria, polydipsia. Musculoskeletal: Denies myalgias, back pain, joint swelling, arthralgias and gait problem.  Skin: Denies pallor, rash and wound.  Neurological: Denies dizziness, seizures, syncope, weakness, light-headedness, numbness and headaches.  Hematological: Denies adenopathy. Easy bruising, personal or family bleeding history  Psychiatric/Behavioral: Denies suicidal ideation, mood changes, confusion, nervousness, sleep disturbance and agitation    Physical Exam: Vitals:   10/17/20 1055  BP: (!) 210/100  Pulse: (!) 46  Temp: 98.1 F (36.7 C)  TempSrc: Oral  SpO2: 94%  Weight: 150 lb 11.2 oz (68.4 kg)    Body mass index is 29.43 kg/m.   Constitutional: NAD, calm, comfortable Eyes: PERRL, lids and conjunctivae normal, wears corrective lenses ENMT: Mucous membranes are moist.  Respiratory: clear to auscultation bilaterally, no wheezing, no crackles. Normal respiratory effort. No accessory muscle use.  Cardiovascular: Regular rate and rhythm, no murmurs / rubs / gallops. No extremity edema.  Neurologic: Grossly intact and nonfocal Psychiatric: Normal judgment and insight. Alert and oriented x 3. Normal mood.    Impression and Plan:  Essential hypertension  -Add clonidine 0.1 mg twice daily to lisinopril 40 mg and Lasix 20 mg daily. -She will come back in 8 weeks for continued blood pressure monitoring, she will do ambulatory blood pressure measurements. -Consider referral to hypertensive clinic if blood pressure cannot be controlled with titration of clonidine.  Time spent: 30 minutes discussing with and examining patient, reviewing medication history and research of the literature for best options for hypertension management.    Patient Instructions  -Nice seeing you today!!  -Start clonidine 0.1 mg twice daily.  -Schedule follow up in 6-8 weeks for your blood pressure.     [name],  MD [facility]

## 2020-10-17 NOTE — Patient Instructions (Signed)
-  Nice seeing you today!!  -Start clonidine 0.1 mg twice daily.  -Schedule follow up in 6-8 weeks for your blood pressure.

## 2020-11-04 ENCOUNTER — Other Ambulatory Visit: Payer: Self-pay | Admitting: Internal Medicine

## 2020-11-04 DIAGNOSIS — I10 Essential (primary) hypertension: Secondary | ICD-10-CM

## 2020-11-07 ENCOUNTER — Telehealth: Payer: Self-pay | Admitting: Internal Medicine

## 2020-11-07 NOTE — Telephone Encounter (Signed)
Pt call and stated she would like a call back to talk about her persecription .

## 2020-11-07 NOTE — Progress Notes (Signed)
[facility] Note  11/12/2020     CHIEF COMPLAINT Patient presents for Retina Follow Up   HISTORY OF PRESENT ILLNESS: Janice Small is a 87 y.o. female who presents to the clinic today for:   HPI    Retina Follow Up    Patient presents with  CRVO/BRVO.  In left eye.  This started 4 weeks ago.  Since onset it is stable.  I, the attending physician,  performed the HPI with the patient and updated documentation appropriately.          Comments    Pt here for 4 week exu ret follow up BRVO OS. Pt states vision is about the same, can tell no difference. No ocular pain or discomfort.        Last edited by Bernarda Caffey, MD on 11/12/2020 12:24 PM. (History)    pt states vision is the same  Referring physician: Isaac Bliss, Rayford Halsted, MD [place],  [place] [address]  HISTORICAL INFORMATION:   Selected notes from the MEDICAL RECORD NUMBER Referred by Dr. Jerline Pain for eval of BRVO OS LEE: 10.12.2021 Ocular Hx- Glc suspect   CURRENT MEDICATIONS: No current outpatient medications on file. (Ophthalmic Drugs)   No current facility-administered medications for this visit. (Ophthalmic Drugs)   Current Outpatient Medications (Other)  Medication Sig  . cloNIDine (CATAPRES) 0.1 MG tablet Take 1 tablet (0.1 mg total) by mouth 2 (two) times daily.  . furosemide (LASIX) 20 MG tablet Take 1 tablet (20 mg total) by mouth daily.  Marland Kitchen lisinopril (ZESTRIL) 40 MG tablet TAKE 1 TABLET BY MOUTH  DAILY  . metroNIDAZOLE (METROGEL) 0.75 % gel Apply 1 application topically daily as needed.  Marland Kitchen SYNTHROID 75 MCG tablet TAKE 1 TABLET BY MOUTH  DAILY   No current facility-administered medications for this visit. (Other)      REVIEW OF SYSTEMS: ROS    Positive for: Gastrointestinal, Musculoskeletal, Eyes   Negative for: Constitutional, Neurological, Skin, Genitourinary, HENT, Endocrine, Cardiovascular, Respiratory, Psychiatric, Allergic/Imm, Heme/Lymph    Last edited by Kingsley Spittle, COT on 11/12/2020  9:34 AM. (History)       ALLERGIES Allergies  Allergen Reactions  . Codeine Phosphate     REACTION: unspecified    PAST MEDICAL HISTORY Past Medical History:  Diagnosis Date  . BREAST BIOPSY, HX OF 11/27/2006  . COLONIC POLYPS, HX OF 11/27/2006  . HEMORRHOIDS 01/17/2010  . HYPERTENSION 11/27/2006  . Hypertensive retinopathy    OU  . HYPOTHYROIDISM 11/27/2006  . Macular degeneration    Dry OU  . MENOPAUSAL SYNDROME 11/27/2006   Past Surgical History:  Procedure Laterality Date  . ABDOMINAL HYSTERECTOMY    . APPENDECTOMY    . BREAST EXCISIONAL BIOPSY Right 1987  . BREAST SURGERY     bx  . CATARACT EXTRACTION Bilateral   . EYE SURGERY Bilateral    Cat Sx OU  . TONSILLECTOMY      FAMILY HISTORY Family History  Problem Relation Age of Onset  . Heart disease Father     SOCIAL HISTORY Social History   Tobacco Use  . Smoking status: Never Smoker  . Smokeless tobacco: Never Used  Substance Use Topics  . Alcohol use: Yes    Comment: rarely  . Drug use: No         OPHTHALMIC EXAM:  Base Eye Exam    Visual Acuity (Snellen - Linear)      Right Left   Dist  cc 20/25 +1 20/40 +1   Dist ph cc NI 20/30   Correction: Glasses       Tonometry (Tonopen, 9:43 AM)      Right Left   Pressure 14 14       Pupils      Dark Light Shape React APD   Right 3 2 Round Brisk None   Left 3 2 Round Brisk None       Visual Fields (Counting fingers)      Left Right    Full Full       Extraocular Movement      Right Left    Full, Ortho Full, Ortho       Neuro/Psych    Oriented x3: Yes   Mood/Affect: Normal       Dilation    Both eyes: 1.0% Mydriacyl, 2.5% Phenylephrine @ 12:25 PM        Slit Lamp and Fundus Exam    Slit Lamp Exam      Right Left   Lids/Lashes Dermatochalasis - upper lid Dermatochalasis - upper lid, mild Meibomian gland dysfunction   Conjunctiva/Sclera White and quiet White and quiet    Cornea 2-3+ inferior Punctate epithelial erosions, well healed temporal cataract wounds, arcus 2-3+ inferior Punctate epithelial erosions, well healed temporal cataract wounds, arcus   Anterior Chamber Deep and quiet Deep and quiet   Iris Round and dilated Round and dilated   Lens 3 piece PC IOL in good position with open PC 3 piece PC IOL in good position, 1+PCO (non-central)   Vitreous Vitreous syneresis, Posterior vitreous detachment Vitreous syneresis, Posterior vitreous detachment       Fundus Exam      Right Left   Disc Pink and Sharp, Compact mild Pallor, Sharp rim, temporal PPA, Compact   C/D Ratio 0.3 0.2   Macula Flat, Blunted foveal reflex, mild Drusen, RPE mottling and clumping, no heme Blunted foveal reflex, scattered IRH/DBH -- improved, +laser changes, focal edema temporal macula -- persistent, Drusen, +focal exudates temporal mac   Vessels attenuated, Tortuous attenuated, Tortuous, severe attenuation of vessels superior macula   Periphery Attached, No heme  Attached, mild reticular degeneration        Refraction    Wearing Rx      Sphere Cylinder Axis Add   Right -1.00 +0.50 027 +2.50   Left -0.75 +0.75 083 +2.50   Type: progressive          IMAGING AND PROCEDURES  Imaging and Procedures for 11/12/2020  OCT, Retina - OU - Both Eyes       Right Eye Quality was good. Central Foveal Thickness: 269. Progression has been stable. Findings include normal foveal contour, no IRF, no SRF, retinal drusen .   Left Eye Quality was good. Central Foveal Thickness: 279. Progression has improved. Findings include normal foveal contour, intraretinal fluid, intraretinal hyper-reflective material, retinal drusen , no SRF (interval improvement in temporal IRF / IRHM).   Notes *Images captured and stored on drive  Diagnosis / Impression:  OD: non-exu ARMD w/ fine drusen OS: BRVO w/ CME -- interval improvement in temporal IRF / IRHM  Clinical management:  See  below  Abbreviations: NFP - Normal foveal profile. CME - cystoid macular edema. PED - pigment epithelial detachment. IRF - intraretinal fluid. SRF - subretinal fluid. EZ - ellipsoid zone. ERM - epiretinal membrane. ORA - outer retinal atrophy. ORT - outer retinal tubulation. SRHM - subretinal hyper-reflective material. IRHM - intraretinal hyper-reflective material  Intravitreal Injection, Pharmacologic Agent - OS - Left Eye       Time Out 11/12/2020. 10:43 AM. Confirmed correct patient, procedure, site, and patient consented.   Anesthesia Topical anesthesia was used. Anesthetic medications included Lidocaine 2%, Proparacaine 0.5%.   Procedure Preparation included 5% betadine to ocular surface, eyelid speculum. A supplied (32g ) needle was used.   Injection:  1.25 mg Bevacizumab (AVASTIN) 1.58m/0.05mL SOLN   NDC: 513086-578-46 Lot:: [id number] Expiration date: 12/02/2020   Route: Intravitreal, Site: Left Eye, Waste: 0.05 mL  Post-op Post injection exam found visual acuity of at least counting fingers. The patient tolerated the procedure well. There were no complications. The patient received written and verbal post procedure care education. Post injection medications were not given.                 ASSESSMENT/PLAN:    ICD-10-CM   1. Branch retinal vein occlusion of left eye with macular edema  H34.8320 Intravitreal Injection, Pharmacologic Agent - OS - Left Eye    Bevacizumab (AVASTIN) SOLN 1.25 mg  2. Essential hypertension  I10   3. Retinal edema  H35.81 OCT, Retina - OU - Both Eyes  4. Hypertensive retinopathy of both eyes  H35.033   5. Early dry stage nonexudative age-related macular degeneration of both eyes  H35.3131   6. Pseudophakia, both eyes  Z96.1   7. Glaucoma suspect of both eyes  H40.003     1,2. BRVO w/ CME OS             - S/P IVA OS #1 (10.18.21), #2 (11.15.21), #3 (12.13.21), #4 (01.10.22), #5 (02.07.22), #6 (03.07.22), #7 (04.04.22)             -  S/P focal laser OS (02.16.22) - BCVA 20/30 -- stable - OCT shows interval improvement in IRF/IRHM temporal macula; mild ORA from focal laser. - recommend IVA OS #8 today, (05.02.22) - pt wishes to proceed - RBA of procedure discussed, questions answered - informed consent obtained and signed - see procedure note - Avastin informed consent obtained and signed, 10.18.21 (OS) - F/U 4 weeks, DFE, OCT, possible injection   3,4. Hypertensive retinopathy OU - discussed importance of tight BP control - monitor  5. Age related macular degeneration, non-exudative, both eyes  - The incidence, anatomy, and pathology of dry AMD, risk of progression, and the AREDS and AREDS 2 study including smoking risks discussed with patient.  - Recommend amsler grid monitoring  6. Pseudophakia OU  - s/p CE/IOL  - IOL in good position, doing well  - monitor  7. Glaucoma Suspect  - IOP 14 OU  - under the expert care of Dr. PJerline Pain Ophthalmic Meds Ordered this visit:  Meds ordered this encounter  Medications  . Bevacizumab (AVASTIN) SOLN 1.25 mg      Return in about 4 weeks (around 12/10/2020) for f/u BRVO OS, DFE, OCT.  There are no Patient Instructions on file for this visit.  This document serves as a record of services personally performed by BGardiner Sleeper MD, PhD. It was created on their behalf by ALeeann Must CGilliam an ophthalmic technician. The creation of this record is the provider's dictation and/or activities during the visit.    Electronically signed by: ALeeann Must COA _0 @ 12:41 PM  This document serves as a record of services personally performed by BGardiner Sleeper MD, PhD. It was created on their behalf by ASan Jetty BOwens Shark OA an ophthalmic technician. The creation of this  record is the provider's dictation and/or activities during the visit.    Electronically signed by: San Jetty. Owens Shark, [place] 05.02.2022 12:41 PM  Gardiner Sleeper, M.D., Ph.D. Diseases & Surgery of the Retina  and [facility] 11/12/2020   I have reviewed the above documentation for accuracy and completeness, and I agree with the above. Gardiner Sleeper, M.D., Ph.D. 11/12/20 12:41 PM  Abbreviations: M myopia (nearsighted); A astigmatism; H hyperopia (farsighted); P presbyopia; Mrx spectacle prescription;  CTL contact lenses; OD right eye; OS left eye; OU both eyes  XT exotropia; ET esotropia; PEK punctate epithelial keratitis; PEE punctate epithelial erosions; DES dry eye syndrome; MGD meibomian gland dysfunction; ATs artificial tears; PFAT's preservative free artificial tears; [place] nuclear sclerotic cataract; PSC posterior subcapsular cataract; ERM epi-retinal membrane; PVD posterior vitreous detachment; RD retinal detachment; DM diabetes mellitus; DR diabetic retinopathy; NPDR non-proliferative diabetic retinopathy; PDR proliferative diabetic retinopathy; CSME clinically significant macular edema; DME diabetic macular edema; dbh dot blot hemorrhages; CWS cotton wool spot; POAG primary open angle glaucoma; C/D cup-to-disc ratio; HVF humphrey visual field; GVF goldmann visual field; OCT optical coherence tomography; IOP intraocular pressure; BRVO Branch retinal vein occlusion; CRVO central retinal vein occlusion; CRAO central retinal artery occlusion; BRAO branch retinal artery occlusion; RT retinal tear; SB scleral buckle; PPV pars plana vitrectomy; VH Vitreous hemorrhage; PRP panretinal laser photocoagulation; IVK intravitreal kenalog; VMT vitreomacular traction; MH Macular hole;  NVD neovascularization of the disc; NVE neovascularization elsewhere; AREDS age related eye disease study; ARMD age related macular degeneration; POAG primary open angle glaucoma; EBMD epithelial/anterior basement membrane dystrophy; ACIOL anterior chamber intraocular lens; IOL intraocular lens; PCIOL posterior chamber intraocular lens; Phaco/IOL phacoemulsification with intraocular lens placement; [place]  photorefractive keratectomy; LASIK laser assisted in situ keratomileusis; HTN hypertension; DM diabetes mellitus; COPD chronic obstructive pulmonary disease

## 2020-11-08 NOTE — Telephone Encounter (Signed)
Spoke with patient and Lisinopril refill has been sent to [facility].

## 2020-11-12 ENCOUNTER — Encounter (INDEPENDENT_AMBULATORY_CARE_PROVIDER_SITE_OTHER): Payer: Self-pay | Admitting: Ophthalmology

## 2020-11-12 ENCOUNTER — Ambulatory Visit (INDEPENDENT_AMBULATORY_CARE_PROVIDER_SITE_OTHER): Payer: Medicare Other | Admitting: Ophthalmology

## 2020-11-12 ENCOUNTER — Other Ambulatory Visit: Payer: Self-pay

## 2020-11-12 DIAGNOSIS — Z961 Presence of intraocular lens: Secondary | ICD-10-CM

## 2020-11-12 DIAGNOSIS — H40003 Preglaucoma, unspecified, bilateral: Secondary | ICD-10-CM

## 2020-11-12 DIAGNOSIS — H34832 Tributary (branch) retinal vein occlusion, left eye, with macular edema: Secondary | ICD-10-CM | POA: Diagnosis not present

## 2020-11-12 DIAGNOSIS — H35033 Hypertensive retinopathy, bilateral: Secondary | ICD-10-CM | POA: Diagnosis not present

## 2020-11-12 DIAGNOSIS — H353131 Nonexudative age-related macular degeneration, bilateral, early dry stage: Secondary | ICD-10-CM

## 2020-11-12 DIAGNOSIS — H3581 Retinal edema: Secondary | ICD-10-CM

## 2020-11-12 DIAGNOSIS — I10 Essential (primary) hypertension: Secondary | ICD-10-CM | POA: Diagnosis not present

## 2020-11-12 MED ORDER — BEVACIZUMAB CHEMO INJECTION 1.25MG/0.05ML SYRINGE FOR KALEIDOSCOPE
1.2500 mg | INTRAVITREAL | Status: AC | PRN
  Administered 2020-11-12: 1.25 mg via INTRAVITREAL

## 2020-11-28 ENCOUNTER — Ambulatory Visit: Payer: Medicare Other | Admitting: Internal Medicine

## 2020-11-28 ENCOUNTER — Other Ambulatory Visit: Payer: Self-pay

## 2020-11-28 ENCOUNTER — Encounter: Payer: Self-pay | Admitting: Internal Medicine

## 2020-11-28 VITALS — BP 200/100 | HR 76 | Temp 97.9°F | Wt 149.9 lb

## 2020-11-28 DIAGNOSIS — I10 Essential (primary) hypertension: Secondary | ICD-10-CM | POA: Diagnosis not present

## 2020-11-28 NOTE — Progress Notes (Signed)
Established Patient Office Visit     This visit occurred during the SARS-CoV-2 public health emergency.  Safety protocols were in place, including screening questions prior to the visit, additional usage of staff PPE, and extensive cleaning of exam room while observing appropriate contact time as indicated for disinfecting solutions.    CC/Reason for Visit: Blood pressure follow-up  HPI: Janice Small is a 87 y.o. female who is coming in today for the above mentioned reasons. Past Medical History is significant for: Uncontrolled hypertension, hypothyroidism, osteoporosis, hyperparathyroidism with hypercalcemia followed by endocrinology.  She was also recently diagnosed with hypertensive retinopathy and central retinal vein occlusion of the left eye.  She has been seeing a retina specialist frequently for this.  She is currently on lisinopril 40 mg daily, Lasix 20 mg daily and the most recent addition 6 weeks ago of clonidine 0.1 mg twice daily.  We have not added beta-blocker as her usual heart rate is in the 40s to 50s, she tried amlodipine and it needed to be discontinued due to significant peripheral edema.  She does have an element of whitecoat syndrome.  Her in office blood pressure is usually around 200/100 (as it is today).  She denies headache, chest pain, shortness of breath, focal neurologic deficit.  I will insert her ambulatory measurements below:         Past Medical/Surgical History: Past Medical History:  Diagnosis Date  . BREAST BIOPSY, HX OF 11/27/2006  . COLONIC POLYPS, HX OF 11/27/2006  . HEMORRHOIDS 01/17/2010  . HYPERTENSION 11/27/2006  . Hypertensive retinopathy    OU  . HYPOTHYROIDISM 11/27/2006  . Macular degeneration    Dry OU  . MENOPAUSAL SYNDROME 11/27/2006    Past Surgical History:  Procedure Laterality Date  . ABDOMINAL HYSTERECTOMY    . APPENDECTOMY    . BREAST EXCISIONAL BIOPSY Right 1987  . BREAST SURGERY     bx  . CATARACT EXTRACTION  Bilateral   . EYE SURGERY Bilateral    Cat Sx OU  . TONSILLECTOMY      Social History:  reports that she has never smoked. She has never used smokeless tobacco. She reports current alcohol use. She reports that she does not use drugs.  Allergies: Allergies  Allergen Reactions  . Codeine Phosphate     REACTION: unspecified    Family History:  Family History  Problem Relation Age of Onset  . Heart disease Father      Current Outpatient Medications:  .  cloNIDine (CATAPRES) 0.1 MG tablet, Take 1 tablet (0.1 mg total) by mouth 2 (two) times daily., Disp: 180 tablet, Rfl: 1 .  furosemide (LASIX) 20 MG tablet, Take 1 tablet (20 mg total) by mouth daily., Disp: 90 tablet, Rfl: 3 .  lisinopril (ZESTRIL) 40 MG tablet, TAKE 1 TABLET BY MOUTH  DAILY, Disp: 90 tablet, Rfl: 1 .  metroNIDAZOLE (METROGEL) 0.75 % gel, Apply 1 application topically daily as needed., Disp: , Rfl:  .  SYNTHROID 75 MCG tablet, TAKE 1 TABLET BY MOUTH  DAILY, Disp: 90 tablet, Rfl: 3  Review of Systems:  Constitutional: Denies fever, chills, diaphoresis, appetite change and fatigue.  HEENT: Denies photophobia, eye pain, redness, hearing loss, ear pain, congestion, sore throat, rhinorrhea, sneezing, mouth sores, trouble swallowing, neck pain, neck stiffness and tinnitus.   Respiratory: Denies SOB, DOE, cough, chest tightness,  and wheezing.   Cardiovascular: Denies chest pain, palpitations and leg swelling.  Gastrointestinal: Denies nausea, vomiting, abdominal pain, diarrhea, constipation, blood  in stool and abdominal distention.  Genitourinary: Denies dysuria, urgency, frequency, hematuria, flank pain and difficulty urinating.  Endocrine: Denies: hot or cold intolerance, sweats, changes in hair or nails, polyuria, polydipsia. Musculoskeletal: Denies myalgias, back pain, joint swelling, arthralgias and gait problem.  Skin: Denies pallor, rash and wound.  Neurological: Denies dizziness, seizures, syncope, weakness,  light-headedness, numbness and headaches.  Hematological: Denies adenopathy. Easy bruising, personal or family bleeding history  Psychiatric/Behavioral: Denies suicidal ideation, mood changes, confusion, nervousness, sleep disturbance and agitation    Physical Exam: Vitals:   11/28/20 1018  BP: (!) 200/100  Pulse: 76  Temp: 97.9 F (36.6 C)  TempSrc: Oral  SpO2: 95%  Weight: 149 lb 14.4 oz (68 kg)    Body mass index is 29.28 kg/m.   Constitutional: NAD, calm, comfortable Eyes: PERRL, lids and conjunctivae normal, wears corrective lenses ENMT: Mucous membranes are moist.  Neck: normal, supple, no masses, no thyromegaly Respiratory: clear to auscultation bilaterally, no wheezing, no crackles. Normal respiratory effort. No accessory muscle use.  Cardiovascular: Regular rate and rhythm, no murmurs / rubs / gallops. No extremity edema. Neurologic: Grossly intact and nonfocal Psychiatric: Normal judgment and insight. Alert and oriented x 3. Normal mood.    Impression and Plan:  Essential hypertension -Significant element of whitecoat syndrome. -I will have her return in 2 weeks with her blood pressure machine to make sure her ambulatory measurements are accurate. -Continue current medications without change until then. -She has been apprised of endorgan damage symptoms of headache, chest pain, shortness of breath and neurologic deficits.  She knows to proceed to the ER if any of these occur.    Patient Instructions  -Nice seeing you today!!  -Schedule follow up in 2 weeks. Please remember to bring in your BP machine and your home BP measurements.     [name], MD [facility]

## 2020-11-28 NOTE — Patient Instructions (Signed)
-  Nice seeing you today!!  -Schedule follow up in 2 weeks. Please remember to bring in your BP machine and your home BP measurements.

## 2020-12-07 NOTE — Progress Notes (Signed)
[facility] Note  12/11/2020     CHIEF COMPLAINT Patient presents for Retina Follow Up   HISTORY OF PRESENT ILLNESS: Janice Small is a 87 y.o. female who presents to the clinic today for:   HPI    Retina Follow Up    Patient presents with  CRVO/BRVO.  In left eye.  This started 4 weeks ago.  Since onset it is stable.  I, the attending physician,  performed the HPI with the patient and updated documentation appropriately.          Comments    Pt here for 4wk exu ret follow up BRVO OS. Pt states vision is about the same, doesn't think there are any changes. No ocular pain or discomfort.        Last edited by Bernarda Caffey, MD on 12/11/2020  1:03 PM. (History)    Vision is doing well except for reading.   Eye feel irritated OS, may use AT's prn.   Referring physician: Shawnie Dapper, DO [address] [place],  [place] [address]  HISTORICAL INFORMATION:   Selected notes from the MEDICAL RECORD NUMBER Referred by Dr. Jerline Pain for eval of BRVO OS LEE: 10.12.2021 Ocular Hx- Glc suspect   CURRENT MEDICATIONS: No current outpatient medications on file. (Ophthalmic Drugs)   No current facility-administered medications for this visit. (Ophthalmic Drugs)   Current Outpatient Medications (Other)  Medication Sig  . cloNIDine (CATAPRES) 0.1 MG tablet Take 1 tablet (0.1 mg total) by mouth 2 (two) times daily.  . furosemide (LASIX) 20 MG tablet Take 1 tablet (20 mg total) by mouth daily.  Marland Kitchen lisinopril (ZESTRIL) 40 MG tablet TAKE 1 TABLET BY MOUTH  DAILY  . metroNIDAZOLE (METROGEL) 0.75 % gel Apply 1 application topically daily as needed.  Marland Kitchen SYNTHROID 75 MCG tablet TAKE 1 TABLET BY MOUTH  DAILY   No current facility-administered medications for this visit. (Other)      REVIEW OF SYSTEMS: ROS    Positive for: Gastrointestinal, Musculoskeletal, Eyes   Negative for: Constitutional, Neurological, Skin, Genitourinary, HENT, Endocrine, Cardiovascular,  Respiratory, Psychiatric, Allergic/Imm, Heme/Lymph   Last edited by Kingsley Spittle, COT on 12/11/2020  9:21 AM. (History)       ALLERGIES Allergies  Allergen Reactions  . Codeine Phosphate     REACTION: unspecified    PAST MEDICAL HISTORY Past Medical History:  Diagnosis Date  . BREAST BIOPSY, HX OF 11/27/2006  . COLONIC POLYPS, HX OF 11/27/2006  . HEMORRHOIDS 01/17/2010  . HYPERTENSION 11/27/2006  . Hypertensive retinopathy    OU  . HYPOTHYROIDISM 11/27/2006  . Macular degeneration    Dry OU  . MENOPAUSAL SYNDROME 11/27/2006   Past Surgical History:  Procedure Laterality Date  . ABDOMINAL HYSTERECTOMY    . APPENDECTOMY    . BREAST EXCISIONAL BIOPSY Right 1987  . BREAST SURGERY     bx  . CATARACT EXTRACTION Bilateral   . EYE SURGERY Bilateral    Cat Sx OU  . TONSILLECTOMY      FAMILY HISTORY Family History  Problem Relation Age of Onset  . Heart disease Father     SOCIAL HISTORY Social History   Tobacco Use  . Smoking status: Never Smoker  . Smokeless tobacco: Never Used  Substance Use Topics  . Alcohol use: Yes    Comment: rarely  . Drug use: No         OPHTHALMIC EXAM:  Base Eye Exam    Visual Acuity (  Snellen - Linear)      Right Left   Dist cc 20/20 -1 20/40 +1   Dist ph cc  20/30 -1   Correction: Glasses       Tonometry (Tonopen, 9:29 AM)      Right Left   Pressure 17 16       Pupils      Dark Light Shape React APD   Right 3 2 Round Brisk None   Left 3 2 Round Brisk None       Visual Fields (Counting fingers)      Left Right    Full Full       Extraocular Movement      Right Left    Full, Ortho Full, Ortho       Neuro/Psych    Oriented x3: Yes   Mood/Affect: Normal       Dilation    Both eyes: 1.0% Mydriacyl, 2.5% Phenylephrine @ 9:29 AM        Slit Lamp and Fundus Exam    Slit Lamp Exam      Right Left   Lids/Lashes Dermatochalasis - upper lid Dermatochalasis - upper lid, mild Meibomian gland dysfunction    Conjunctiva/Sclera White and quiet White and quiet   Cornea 2-3+ inferior Punctate epithelial erosions, well healed temporal cataract wounds, arcus 2-3+ inferior Punctate epithelial erosions, well healed temporal cataract wounds, arcus   Anterior Chamber Deep and quiet Deep and quiet   Iris Round and dilated Round and dilated   Lens 3 piece PC IOL in good position with open PC 3 piece PC IOL in good position, 1+PCO (non-central)   Vitreous Vitreous syneresis, Posterior vitreous detachment Vitreous syneresis, Posterior vitreous detachment       Fundus Exam      Right Left   Disc Pink and Sharp, Compact mild Pallor, Sharp rim, temporal PPA, Compact   C/D Ratio 0.3 0.2   Macula Flat, Blunted foveal reflex, mild Drusen, RPE mottling and clumping, no heme Blunted foveal reflex, scattered IRH/DBH -- persistent, +laser changes, focal edema temporal macula -- persistent, Drusen, +focal exudates temporal mac   Vessels attenuated, Tortuous attenuated, Tortuous, severe attenuation of vessels superior macula   Periphery Attached, No heme  Attached, mild reticular degeneration        Refraction    Wearing Rx      Sphere Cylinder Axis Add   Right -1.00 +0.50 027 +2.50   Left -0.75 +0.75 083 +2.50   Type: progressive       Wearing Rx #2      Sphere Cylinder Axis Add   Right -1.00 +0.50 027 +2.50   Left -0.75 +0.75 083 +2.50   Type: progressive          IMAGING AND PROCEDURES  Imaging and Procedures for 12/11/2020  OCT, Retina - OU - Both Eyes       Right Eye Quality was good. Central Foveal Thickness: 269. Progression has been stable. Findings include normal foveal contour, no IRF, no SRF, retinal drusen .   Left Eye Quality was good. Central Foveal Thickness: 281. Progression has been stable. Findings include normal foveal contour, intraretinal fluid, intraretinal hyper-reflective material, retinal drusen , no SRF (Persistent temporal IRF ).   Notes *Images captured and stored on  drive  Diagnosis / Impression:  OD: non-exu ARMD w/ fine drusen OS: BRVO w/ CME -- Persistent temporal IRF   Clinical management:  See below  Abbreviations: NFP - Normal foveal profile. CME -  cystoid macular edema. PED - pigment epithelial detachment. IRF - intraretinal fluid. SRF - subretinal fluid. EZ - ellipsoid zone. ERM - epiretinal membrane. ORA - outer retinal atrophy. ORT - outer retinal tubulation. SRHM - subretinal hyper-reflective material. IRHM - intraretinal hyper-reflective material        Intravitreal Injection, Pharmacologic Agent - OS - Left Eye       Time Out 12/11/2020. 10:29 AM. Confirmed correct patient, procedure, site, and patient consented.   Anesthesia Topical anesthesia was used. Anesthetic medications included Lidocaine 2%, Proparacaine 0.5%.   Procedure Preparation included 5% betadine to ocular surface, eyelid speculum. A (32g ) needle was used.   Injection:  2 mg aflibercept Alfonse Flavors) SOLN   NDC: 81157-262-03, Lot: [id number], Expiration date: 07/12/2021   Route: Intravitreal, Site: Left Eye, Waste: 0.05 mL  Post-op Post injection exam found visual acuity of at least counting fingers. The patient tolerated the procedure well. There were no complications. The patient received written and verbal post procedure care education. Post injection medications were not given.   Notes **SAMPLE MEDICATION ADMINISTERED**                ASSESSMENT/PLAN:    ICD-10-CM   1. Branch retinal vein occlusion of left eye with macular edema  H34.8320 Intravitreal Injection, Pharmacologic Agent - OS - Left Eye    aflibercept (EYLEA) SOLN 2 mg  2. Retinal edema  H35.81 OCT, Retina - OU - Both Eyes  3. Essential hypertension  I10   4. Hypertensive retinopathy of both eyes  H35.033   5. Early dry stage nonexudative age-related macular degeneration of both eyes  H35.3131   6. Pseudophakia, both eyes  Z96.1   7. Glaucoma suspect of both eyes  H40.003     1,2.  BRVO w/ CME OS             - S/P IVA OS #1 (10.18.21), #2 (11.15.21), #3 (12.13.21), #4 (01.10.22), #5 (02.07.22), #6 (03.07.22), #7 (04.04.22), #8 (5.2.22)             - S/P focal laser OS (02.16.22) - BCVA 20/30 -- stable - OCT shows persistent temporal IRF; mild ORA from focal laser. -- apparent resistance to IVA - discussed switching injection medication to [facility]  - recommend IVE sample #1 today OS, 05.31.22 - pt wishes to proceed - RBA of procedure discussed, questions answered - informed consent obtained and signed - see procedure note - Avastin informed consent obtained and signed, 10.18.21 (OS) - Eylea informed consent obtained and signed, 05.31.22 - Eylea4U paperwork completed 05.31.22 - F/U 4 weeks, DFE, OCT, possible injection   3,4. Hypertensive retinopathy OU - discussed importance of tight BP control - monitor  5. Age related macular degeneration, non-exudative, both eyes  - The incidence, anatomy, and pathology of dry AMD, risk of progression, and the AREDS and AREDS 2 study including smoking risks discussed with patient.  - Recommend amsler grid monitoring  6. Pseudophakia OU  - s/p CE/IOL  - IOL in good position, doing well  - monitor  7. Glaucoma Suspect  - IOP 14 OU  - under the expert care of Dr. Jerline Pain  Ophthalmic Meds Ordered this visit:  Meds ordered this encounter  Medications  . aflibercept (EYLEA) SOLN 2 mg      Return in about 4 weeks (around 01/08/2021) for DFE, OCT, possible treatment.  There are no Patient Instructions on file for this visit.  This document serves as a record of services personally performed by  Gardiner Sleeper, MD, PhD. It was created on their behalf by Estill Bakes, [place] an ophthalmic technician. The creation of this record is the provider's dictation and/or activities during the visit.    Electronically signed by: Estill Bakes, COT 5.27.22 @ 1:07 PM   This document serves as a record of services personally performed by  Gardiner Sleeper, MD, PhD. It was created on their behalf by San Jetty. Owens Shark, OA an ophthalmic technician. The creation of this record is the provider's dictation and/or activities during the visit.    Electronically signed by: San Jetty. [place], [place] 05.31.2022 1:07 PM  Gardiner Sleeper, M.D., Ph.D. Diseases & Surgery of the Retina and Vitreous [facility]  I have reviewed the above documentation for accuracy and completeness, and I agree with the above. Gardiner Sleeper, M.D., Ph.D. 12/11/20 1:07 PM  Abbreviations: M myopia (nearsighted); A astigmatism; H hyperopia (farsighted); P presbyopia; Mrx spectacle prescription;  CTL contact lenses; OD right eye; OS left eye; OU both eyes  XT exotropia; ET esotropia; PEK punctate epithelial keratitis; PEE punctate epithelial erosions; DES dry eye syndrome; MGD meibomian gland dysfunction; ATs artificial tears; PFAT's preservative free artificial tears; [place] nuclear sclerotic cataract; PSC posterior subcapsular cataract; ERM epi-retinal membrane; PVD posterior vitreous detachment; RD retinal detachment; DM diabetes mellitus; DR diabetic retinopathy; NPDR non-proliferative diabetic retinopathy; PDR proliferative diabetic retinopathy; CSME clinically significant macular edema; DME diabetic macular edema; dbh dot blot hemorrhages; CWS cotton wool spot; POAG primary open angle glaucoma; C/D cup-to-disc ratio; HVF humphrey visual field; GVF goldmann visual field; OCT optical coherence tomography; IOP intraocular pressure; BRVO Branch retinal vein occlusion; CRVO central retinal vein occlusion; CRAO central retinal artery occlusion; BRAO branch retinal artery occlusion; RT retinal tear; SB scleral buckle; PPV pars plana vitrectomy; VH Vitreous hemorrhage; PRP panretinal laser photocoagulation; IVK intravitreal kenalog; VMT vitreomacular traction; MH Macular hole;  NVD neovascularization of the disc; NVE neovascularization elsewhere; AREDS age related eye  disease study; ARMD age related macular degeneration; POAG primary open angle glaucoma; EBMD epithelial/anterior basement membrane dystrophy; ACIOL anterior chamber intraocular lens; IOL intraocular lens; PCIOL posterior chamber intraocular lens; Phaco/IOL phacoemulsification with intraocular lens placement; [place] photorefractive keratectomy; LASIK laser assisted in situ keratomileusis; HTN hypertension; DM diabetes mellitus; COPD chronic obstructive pulmonary disease

## 2020-12-11 ENCOUNTER — Ambulatory Visit (INDEPENDENT_AMBULATORY_CARE_PROVIDER_SITE_OTHER): Payer: Medicare Other | Admitting: Ophthalmology

## 2020-12-11 ENCOUNTER — Other Ambulatory Visit: Payer: Self-pay

## 2020-12-11 ENCOUNTER — Encounter (INDEPENDENT_AMBULATORY_CARE_PROVIDER_SITE_OTHER): Payer: Self-pay | Admitting: Ophthalmology

## 2020-12-11 DIAGNOSIS — H3581 Retinal edema: Secondary | ICD-10-CM | POA: Diagnosis not present

## 2020-12-11 DIAGNOSIS — I10 Essential (primary) hypertension: Secondary | ICD-10-CM

## 2020-12-11 DIAGNOSIS — H353131 Nonexudative age-related macular degeneration, bilateral, early dry stage: Secondary | ICD-10-CM

## 2020-12-11 DIAGNOSIS — H40003 Preglaucoma, unspecified, bilateral: Secondary | ICD-10-CM

## 2020-12-11 DIAGNOSIS — H34832 Tributary (branch) retinal vein occlusion, left eye, with macular edema: Secondary | ICD-10-CM | POA: Diagnosis not present

## 2020-12-11 DIAGNOSIS — H35033 Hypertensive retinopathy, bilateral: Secondary | ICD-10-CM

## 2020-12-11 DIAGNOSIS — Z961 Presence of intraocular lens: Secondary | ICD-10-CM

## 2020-12-11 MED ORDER — AFLIBERCEPT 2MG/0.05ML IZ SOLN FOR KALEIDOSCOPE
2.0000 mg | INTRAVITREAL | Status: AC | PRN
  Administered 2020-12-11: 2 mg via INTRAVITREAL

## 2020-12-12 ENCOUNTER — Encounter: Payer: Self-pay | Admitting: Internal Medicine

## 2020-12-12 ENCOUNTER — Ambulatory Visit: Payer: Medicare Other | Admitting: Internal Medicine

## 2020-12-12 VITALS — BP 180/100 | HR 78 | Temp 98.1°F | Wt 149.4 lb

## 2020-12-12 DIAGNOSIS — I10 Essential (primary) hypertension: Secondary | ICD-10-CM | POA: Diagnosis not present

## 2020-12-12 NOTE — Progress Notes (Signed)
Established Patient Office Visit     This visit occurred during the SARS-CoV-2 public health emergency.  Safety protocols were in place, including screening questions prior to the visit, additional usage of staff PPE, and extensive cleaning of exam room while observing appropriate contact time as indicated for disinfecting solutions.    CC/Reason for Visit: Follow-up blood pressure  HPI: Janice Small is a 87 y.o. female who is coming in today for the above mentioned reasons. Past Medical History is significant for: Hypertension with hypertensive retinopathy, hypothyroidism, osteoporosis and hyperparathyroidism.  She is here today for blood pressure follow-up.  Her current medications include lisinopril 40 mg daily, furosemide 20 mg daily and clonidine 0.1 mg twice daily.  She had to be taken off amlodipine due to significant peripheral edema, she usually is bradycardic and because of this we have not attempted a beta-blocker.  She has been known to have an element of whitecoat syndrome.  At her in office visit 2 weeks ago her blood pressure was 200/100 with fairly normal home blood pressure measurements (see prior note for details).  I asked her to bring in her blood pressure cuff today to make sure that it is accurate.  In office measurement today is 180/100 manually, with her home blood pressure cuff it is 185/99.  I will insert a picture of her ambulatory measurements below:    She feels well, she has no chest pain, shortness of breath, headache, focal neurologic deficit.  She continues to follow routinely with her retina specialist.   Past Medical/Surgical History: Past Medical History:  Diagnosis Date  . BREAST BIOPSY, HX OF 11/27/2006  . COLONIC POLYPS, HX OF 11/27/2006  . HEMORRHOIDS 01/17/2010  . HYPERTENSION 11/27/2006  . Hypertensive retinopathy    OU  . HYPOTHYROIDISM 11/27/2006  . Macular degeneration    Dry OU  . MENOPAUSAL SYNDROME 11/27/2006    Past Surgical  History:  Procedure Laterality Date  . ABDOMINAL HYSTERECTOMY    . APPENDECTOMY    . BREAST EXCISIONAL BIOPSY Right 1987  . BREAST SURGERY     bx  . CATARACT EXTRACTION Bilateral   . EYE SURGERY Bilateral    Cat Sx OU  . TONSILLECTOMY      Social History:  reports that she has never smoked. She has never used smokeless tobacco. She reports current alcohol use. She reports that she does not use drugs.  Allergies: Allergies  Allergen Reactions  . Codeine Phosphate     REACTION: unspecified    Family History:  Family History  Problem Relation Age of Onset  . Heart disease Father      Current Outpatient Medications:  .  cloNIDine (CATAPRES) 0.1 MG tablet, Take 1 tablet (0.1 mg total) by mouth 2 (two) times daily., Disp: 180 tablet, Rfl: 1 .  furosemide (LASIX) 20 MG tablet, Take 1 tablet (20 mg total) by mouth daily., Disp: 90 tablet, Rfl: 3 .  lisinopril (ZESTRIL) 40 MG tablet, TAKE 1 TABLET BY MOUTH  DAILY, Disp: 90 tablet, Rfl: 1 .  metroNIDAZOLE (METROGEL) 0.75 % gel, Apply 1 application topically daily as needed., Disp: , Rfl:  .  SYNTHROID 75 MCG tablet, TAKE 1 TABLET BY MOUTH  DAILY, Disp: 90 tablet, Rfl: 3  Review of Systems:  Constitutional: Denies fever, chills, diaphoresis, appetite change and fatigue.  HEENT: Denies photophobia, eye pain, redness, hearing loss, ear pain, congestion, sore throat, rhinorrhea, sneezing, mouth sores, trouble swallowing, neck pain, neck stiffness and tinnitus.  Respiratory: Denies SOB, DOE, cough, chest tightness,  and wheezing.   Cardiovascular: Denies chest pain, palpitations and leg swelling.  Gastrointestinal: Denies nausea, vomiting, abdominal pain, diarrhea, constipation, blood in stool and abdominal distention.  Genitourinary: Denies dysuria, urgency, frequency, hematuria, flank pain and difficulty urinating.  Endocrine: Denies: hot or cold intolerance, sweats, changes in hair or nails, polyuria, polydipsia. Musculoskeletal:  Denies myalgias, back pain, joint swelling, arthralgias and gait problem.  Skin: Denies pallor, rash and wound.  Neurological: Denies dizziness, seizures, syncope, weakness, light-headedness, numbness and headaches.  Hematological: Denies adenopathy. Easy bruising, personal or family bleeding history  Psychiatric/Behavioral: Denies suicidal ideation, mood changes, confusion, nervousness, sleep disturbance and agitation    Physical Exam: Vitals:   12/12/20 0946  BP: (!) 180/100  Pulse: 78  Temp: 98.1 F (36.7 C)  TempSrc: Oral  SpO2: 97%  Weight: 149 lb 6.4 oz (67.8 kg)    Body mass index is 29.18 kg/m.   Constitutional: NAD, calm, comfortable Eyes: PERRL, lids and conjunctivae normal, wears corrective lenses ENMT: Mucous membranes are moist.  Respiratory: clear to auscultation bilaterally, no wheezing, no crackles. Normal respiratory effort. No accessory muscle use.  Cardiovascular: Regular rate and rhythm, no murmurs / rubs / gallops. No extremity edema.  Neurologic: Grossly intact and nonfocal Psychiatric: Normal judgment and insight. Alert and oriented x 3. Normal mood.    Impression and Plan:  Essential hypertension White coat syndrome with diagnosis of hypertension  -Her ambulatory cuff appears to be accurate. -She definitely has an element of whitecoat syndrome. -With her current ambulatory measurements, I do not believe we need to adjust antihypertensive regimen today. -Follow-up in 3 to 4 months.  Time spent: 31 minutes reviewing chart, interviewing patient and son, examining patient and formulating plan of care.     [name], MD [facility]

## 2021-01-07 NOTE — Progress Notes (Signed)
[facility] Note  01/08/2021     CHIEF COMPLAINT Patient presents for Retina Follow Up   HISTORY OF PRESENT ILLNESS: Janice Small is a 87 y.o. female who presents to the clinic today for:  HPI     Retina Follow Up   Patient presents with  CRVO/BRVO.  In left eye.  This started 4 weeks ago.  I, the attending physician,  performed the HPI with the patient and updated documentation appropriately.        Comments   Patient here for 4 weeks retina follow up for BRVO OS. Patient states vision not too bad. No eye pain.       Last edited by Bernarda Caffey, MD on 01/08/2021 11:57 AM.     Referring physician: Isaac Bliss, Rayford Halsted, MD [place],  [place] [address]  HISTORICAL INFORMATION:   Selected notes from the MEDICAL RECORD NUMBER Referred by Dr. Jerline Pain for eval of BRVO OS LEE: 10.12.2021 Ocular Hx- Glc suspect   CURRENT MEDICATIONS: No current outpatient medications on file. (Ophthalmic Drugs)   No current facility-administered medications for this visit. (Ophthalmic Drugs)   Current Outpatient Medications (Other)  Medication Sig   cloNIDine (CATAPRES) 0.1 MG tablet Take 1 tablet (0.1 mg total) by mouth 2 (two) times daily.   furosemide (LASIX) 20 MG tablet Take 1 tablet (20 mg total) by mouth daily.   lisinopril (ZESTRIL) 40 MG tablet TAKE 1 TABLET BY MOUTH  DAILY   metroNIDAZOLE (METROGEL) 0.75 % gel Apply 1 application topically daily as needed.   SYNTHROID 75 MCG tablet TAKE 1 TABLET BY MOUTH  DAILY   No current facility-administered medications for this visit. (Other)      REVIEW OF SYSTEMS: ROS   Positive for: Gastrointestinal, Musculoskeletal, Eyes Negative for: Constitutional, Neurological, Skin, Genitourinary, HENT, Endocrine, Cardiovascular, Respiratory, Psychiatric, Allergic/Imm, Heme/Lymph Last edited by Theodore Demark, COA on 01/08/2021  9:23 AM.        ALLERGIES Allergies  Allergen  Reactions   Codeine Phosphate     REACTION: unspecified    PAST MEDICAL HISTORY Past Medical History:  Diagnosis Date   BREAST BIOPSY, HX OF 11/27/2006   COLONIC POLYPS, HX OF 11/27/2006   HEMORRHOIDS 01/17/2010   HYPERTENSION 11/27/2006   Hypertensive retinopathy    OU   HYPOTHYROIDISM 11/27/2006   Macular degeneration    Dry OU   MENOPAUSAL SYNDROME 11/27/2006   Past Surgical History:  Procedure Laterality Date   ABDOMINAL HYSTERECTOMY     APPENDECTOMY     BREAST EXCISIONAL BIOPSY Right 1987   BREAST SURGERY     bx   CATARACT EXTRACTION Bilateral    EYE SURGERY Bilateral    Cat Sx OU   TONSILLECTOMY      FAMILY HISTORY Family History  Problem Relation Age of Onset   Heart disease Father     SOCIAL HISTORY Social History   Tobacco Use   Smoking status: Never   Smokeless tobacco: Never  Substance Use Topics   Alcohol use: Yes    Comment: rarely   Drug use: No         OPHTHALMIC EXAM:  Base Eye Exam     Visual Acuity (Snellen - Linear)       Right Left   Dist cc 20/25 -2 20/40 -1   Dist ph cc NI 20/30 -1    Correction: Glasses         Tonometry (Tonopen, 9:20 AM)  Right Left   Pressure 17 17         Pupils       Dark Light Shape React APD   Right 3 2 Round Brisk None   Left 3 2 Round Brisk None         Visual Fields (Counting fingers)       Left Right    Full Full         Extraocular Movement       Right Left    Full, Ortho Full, Ortho         Neuro/Psych     Oriented x3: Yes   Mood/Affect: Normal         Dilation     Both eyes: 1.0% Mydriacyl, 2.5% Phenylephrine @ 9:20 AM           Slit Lamp and Fundus Exam     Slit Lamp Exam       Right Left   Lids/Lashes Dermatochalasis - upper lid Dermatochalasis - upper lid, mild Meibomian gland dysfunction   Conjunctiva/Sclera White and quiet White and quiet   Cornea 2-3+ inferior Punctate epithelial erosions, well healed temporal cataract wounds, arcus  2-3+ inferior Punctate epithelial erosions, well healed temporal cataract wounds, arcus   Anterior Chamber Deep and quiet Deep and quiet   Iris Round and dilated Round and dilated   Lens 3 piece PC IOL in good position with open PC 3 piece PC IOL in good position, 1+PCO (non-central)   Vitreous Vitreous syneresis, Posterior vitreous detachment Vitreous syneresis, Posterior vitreous detachment         Fundus Exam       Right Left   Disc Pink and Sharp, Compact mild Pallor, Sharp rim, temporal PPA, Compact   C/D Ratio 0.3 0.2   Macula Flat, Blunted foveal reflex, mild Drusen, RPE mottling and clumping, no heme Blunted foveal reflex, scattered IRH/DBH -- improved, +laser changes, focal edema temporal macula -- improved, Drusen, +focal exudates temporal mac   Vessels attenuated, Tortuous attenuated, Tortuous, severe attenuation of vessels superior macula   Periphery Attached, No heme  Attached, mild reticular degeneration           Refraction     Wearing Rx       Sphere Cylinder Axis Add   Right -1.00 +0.50 027 +2.50   Left -0.75 +0.75 083 +2.50    Type: progressive         Wearing Rx #2       Sphere Cylinder Axis Add   Right -1.00 +0.50 027 +2.50   Left -0.75 +0.75 083 +2.50    Type: progressive            IMAGING AND PROCEDURES  Imaging and Procedures for 01/08/2021  OCT, Retina - OU - Both Eyes       Right Eye Quality was good. Central Foveal Thickness: 226. Progression has been stable. Findings include normal foveal contour, no IRF, no SRF, retinal drusen .   Left Eye Quality was good. Central Foveal Thickness: 273. Progression has improved. Findings include normal foveal contour, intraretinal fluid, intraretinal hyper-reflective material, retinal drusen , no SRF (Interval improvement in temporal IRF / IRHM).   Notes *Images captured and stored on drive  Diagnosis / Impression:  OD: non-exu ARMD w/ fine drusen OS: BRVO w/ CME -- Interval improvement  in temporal IRF / IRHM  Clinical management:  See below  Abbreviations: NFP - Normal foveal profile. CME - cystoid macular edema. PED -  pigment epithelial detachment. IRF - intraretinal fluid. SRF - subretinal fluid. EZ - ellipsoid zone. ERM - epiretinal membrane. ORA - outer retinal atrophy. ORT - outer retinal tubulation. SRHM - subretinal hyper-reflective material. IRHM - intraretinal hyper-reflective material      Intravitreal Injection, Pharmacologic Agent - OS - Left Eye       Time Out 01/08/2021. 9:55 AM. Confirmed correct patient, procedure, site, and patient consented.   Anesthesia Topical anesthesia was used. Anesthetic medications included Lidocaine 2%, Proparacaine 0.5%.   Procedure Preparation included 5% betadine to ocular surface, eyelid speculum. A (32g ) needle was used.   Injection: 2 mg aflibercept 2 MG/0.05ML   Route: Intravitreal, Site: Left Eye   NDC: [id number], Lot: [id number], Expiration date: 11/10/2021, Waste: 0.05 mL   Post-op Post injection exam found visual acuity of at least counting fingers. The patient tolerated the procedure well. There were no complications. The patient received written and verbal post procedure care education. Post injection medications were not given.               ASSESSMENT/PLAN:    ICD-10-CM   1. Branch retinal vein occlusion of left eye with macular edema  H34.8320 Intravitreal Injection, Pharmacologic Agent - OS - Left Eye    aflibercept (EYLEA) SOLN 2 mg    2. Retinal edema  H35.81 OCT, Retina - OU - Both Eyes    3. Essential hypertension  I10     4. Hypertensive retinopathy of both eyes  H35.033     5. Early dry stage nonexudative age-related macular degeneration of both eyes  H35.3131     6. Pseudophakia, both eyes  Z96.1     7. Glaucoma suspect of both eyes  H40.003       1,2. BRVO w/ CME OS             - S/P IVA OS #1 (10.18.21), #2 (11.15.21), #3 (12.13.21), #4 (01.10.22), #5 (02.07.22), #6  (03.07.22), #7 (04.04.22), #8 (5.2.22) -- IVA resistance  - S/P IVE OS #1 (05.31.22) -- sample             - S/P focal laser OS (02.16.22) - BCVA 20/30 -- stable - OCT shows interval improvement in temporal IRF / IRHM -- good initial response to IVE - recommend IVE #2 today OS, [phone] - pt wishes to proceed - RBA of procedure discussed, questions answered - informed consent obtained and signed - see procedure note - Avastin informed consent obtained and signed, 10.18.21 (OS) - Eylea informed consent obtained and signed, 05.31.22 - Eylea4U paperwork completed 05.31.22 - F/U 4 weeks, DFE, OCT, possible injection   3,4. Hypertensive retinopathy OU - discussed importance of tight BP control - monitor  5. Age related macular degeneration, non-exudative, both eyes  - The incidence, anatomy, and pathology of dry AMD, risk of progression, and the AREDS and AREDS 2 study including smoking risks discussed with patient.  - Recommend amsler grid monitoring  6. Pseudophakia OU  - s/p CE/IOL  - IOL in good position, doing well  - monitor  7. Glaucoma Suspect  - IOP 17 OU  - under the expert care of Dr. Jerline Pain  Ophthalmic Meds Ordered this visit:  Meds ordered this encounter  Medications   aflibercept (EYLEA) SOLN 2 mg       Return in about 4 weeks (around 02/05/2021) for f/u BRVO OS, DFE, OCT.  There are no Patient Instructions on file for this visit.  This document serves as a record  of services personally performed by Gardiner Sleeper, MD, PhD. It was created on their behalf by Estill Bakes, [place] an ophthalmic technician. The creation of this record is the provider's dictation and/or activities during the visit.    Electronically signed by: Estill Bakes, COT 6.27.22 @ 12:03 PM   This document serves as a record of services personally performed by Gardiner Sleeper, MD, PhD. It was created on their behalf by San Jetty. Owens Shark, OA an ophthalmic technician. The creation of this record  is the provider's dictation and/or activities during the visit.    Electronically signed by: San Jetty. Marguerita Merles 06.28.2022 12:03 PM   Gardiner Sleeper, M.D., Ph.D. Diseases & Surgery of the Retina and Vitreous [facility]  I have reviewed the above documentation for accuracy and completeness, and I agree with the above. Gardiner Sleeper, M.D., Ph.D. 01/08/21 12:03 PM   Abbreviations: M myopia (nearsighted); A astigmatism; H hyperopia (farsighted); P presbyopia; Mrx spectacle prescription;  CTL contact lenses; OD right eye; OS left eye; OU both eyes  XT exotropia; ET esotropia; PEK punctate epithelial keratitis; PEE punctate epithelial erosions; DES dry eye syndrome; MGD meibomian gland dysfunction; ATs artificial tears; PFAT's preservative free artificial tears; [place] nuclear sclerotic cataract; PSC posterior subcapsular cataract; ERM epi-retinal membrane; PVD posterior vitreous detachment; RD retinal detachment; DM diabetes mellitus; DR diabetic retinopathy; NPDR non-proliferative diabetic retinopathy; PDR proliferative diabetic retinopathy; CSME clinically significant macular edema; DME diabetic macular edema; dbh dot blot hemorrhages; CWS cotton wool spot; POAG primary open angle glaucoma; C/D cup-to-disc ratio; HVF humphrey visual field; GVF goldmann visual field; OCT optical coherence tomography; IOP intraocular pressure; BRVO Branch retinal vein occlusion; CRVO central retinal vein occlusion; CRAO central retinal artery occlusion; BRAO branch retinal artery occlusion; RT retinal tear; SB scleral buckle; PPV pars plana vitrectomy; VH Vitreous hemorrhage; PRP panretinal laser photocoagulation; IVK intravitreal kenalog; VMT vitreomacular traction; MH Macular hole;  NVD neovascularization of the disc; NVE neovascularization elsewhere; AREDS age related eye disease study; ARMD age related macular degeneration; POAG primary open angle glaucoma; EBMD epithelial/anterior basement  membrane dystrophy; ACIOL anterior chamber intraocular lens; IOL intraocular lens; PCIOL posterior chamber intraocular lens; Phaco/IOL phacoemulsification with intraocular lens placement; [place] photorefractive keratectomy; LASIK laser assisted in situ keratomileusis; HTN hypertension; DM diabetes mellitus; COPD chronic obstructive pulmonary disease

## 2021-01-08 ENCOUNTER — Ambulatory Visit (INDEPENDENT_AMBULATORY_CARE_PROVIDER_SITE_OTHER): Payer: Medicare Other | Admitting: Ophthalmology

## 2021-01-08 ENCOUNTER — Other Ambulatory Visit: Payer: Self-pay | Admitting: *Deleted

## 2021-01-08 ENCOUNTER — Encounter (INDEPENDENT_AMBULATORY_CARE_PROVIDER_SITE_OTHER): Payer: Self-pay | Admitting: Ophthalmology

## 2021-01-08 ENCOUNTER — Other Ambulatory Visit: Payer: Self-pay

## 2021-01-08 DIAGNOSIS — I10 Essential (primary) hypertension: Secondary | ICD-10-CM

## 2021-01-08 DIAGNOSIS — H3581 Retinal edema: Secondary | ICD-10-CM

## 2021-01-08 DIAGNOSIS — H34832 Tributary (branch) retinal vein occlusion, left eye, with macular edema: Secondary | ICD-10-CM

## 2021-01-08 DIAGNOSIS — Z961 Presence of intraocular lens: Secondary | ICD-10-CM

## 2021-01-08 DIAGNOSIS — H35033 Hypertensive retinopathy, bilateral: Secondary | ICD-10-CM | POA: Diagnosis not present

## 2021-01-08 DIAGNOSIS — H40003 Preglaucoma, unspecified, bilateral: Secondary | ICD-10-CM

## 2021-01-08 DIAGNOSIS — H353131 Nonexudative age-related macular degeneration, bilateral, early dry stage: Secondary | ICD-10-CM

## 2021-01-08 MED ORDER — AFLIBERCEPT 2MG/0.05ML IZ SOLN FOR KALEIDOSCOPE
2.0000 mg | INTRAVITREAL | Status: AC | PRN
  Administered 2021-01-08: 2 mg via INTRAVITREAL

## 2021-01-08 MED ORDER — CLONIDINE HCL 0.1 MG PO TABS: 0.1000 mg | ORAL_TABLET | Freq: Two times a day (BID) | ORAL | 1 refills | Status: DC

## 2021-01-15 ENCOUNTER — Telehealth: Payer: Self-pay

## 2021-01-15 NOTE — Telephone Encounter (Signed)
Prolia VOB initiated via [email or web address]  Last OV: 07/10/20 Next OV:  Last Prolia inj: 08/31/20 Next Prolia inj DUE: 03/01/21

## 2021-01-16 NOTE — Telephone Encounter (Signed)
Pt ready for scheduling on or after 03/01/21  Out-of-pocket cost due at time of visit: $$280  Primary: [employer] Medicare Prolia co-insurance: 20% ($255) Admin fee co-insurance: 20% ($25)  Secondary: n/a Prolia co-insurance:  Admin fee co-insurance:   Deductible: $290 of $290  Prior Auth: n/a PA# Valid:

## 2021-02-05 ENCOUNTER — Ambulatory Visit (INDEPENDENT_AMBULATORY_CARE_PROVIDER_SITE_OTHER): Payer: Medicare Other | Admitting: Ophthalmology

## 2021-02-05 ENCOUNTER — Encounter (INDEPENDENT_AMBULATORY_CARE_PROVIDER_SITE_OTHER): Payer: Self-pay | Admitting: Ophthalmology

## 2021-02-05 ENCOUNTER — Other Ambulatory Visit: Payer: Self-pay

## 2021-02-05 DIAGNOSIS — H35033 Hypertensive retinopathy, bilateral: Secondary | ICD-10-CM

## 2021-02-05 DIAGNOSIS — H40003 Preglaucoma, unspecified, bilateral: Secondary | ICD-10-CM

## 2021-02-05 DIAGNOSIS — Z961 Presence of intraocular lens: Secondary | ICD-10-CM

## 2021-02-05 DIAGNOSIS — I10 Essential (primary) hypertension: Secondary | ICD-10-CM | POA: Diagnosis not present

## 2021-02-05 DIAGNOSIS — H34832 Tributary (branch) retinal vein occlusion, left eye, with macular edema: Secondary | ICD-10-CM

## 2021-02-05 DIAGNOSIS — H3581 Retinal edema: Secondary | ICD-10-CM

## 2021-02-05 DIAGNOSIS — H353131 Nonexudative age-related macular degeneration, bilateral, early dry stage: Secondary | ICD-10-CM

## 2021-02-05 MED ORDER — AFLIBERCEPT 2MG/0.05ML IZ SOLN FOR KALEIDOSCOPE
2.0000 mg | INTRAVITREAL | Status: AC | PRN
  Administered 2021-02-05: 2 mg via INTRAVITREAL

## 2021-02-05 NOTE — Progress Notes (Addendum)
[facility] Note  02/05/2021     CHIEF COMPLAINT Patient presents for Retina Follow Up   HISTORY OF PRESENT ILLNESS: Janice Small is a 87 y.o. female who presents to the clinic today for:  HPI     Retina Follow Up   Patient presents with  CRVO/BRVO.  In left eye.  This started 4 weeks ago.  I, the attending physician,  performed the HPI with the patient and updated documentation appropriately.        Comments   Patient here for 4 week retina follow up for BRVO OS. Patient states vision doesn't notice too much difference until reads newspaper-small print. No eye pain.       Last edited by Bernarda Caffey, MD on 02/05/2021 12:28 PM.    Pt not noticing as many floaters as before.  Referring physician: Isaac Bliss, Rayford Halsted, MD [place],  [place] [id number]  HISTORICAL INFORMATION:   Selected notes from the MEDICAL RECORD NUMBER Referred by Dr. Jerline Pain for eval of BRVO OS LEE: 10.12.2021 Ocular Hx- Glc suspect   CURRENT MEDICATIONS: No current outpatient medications on file. (Ophthalmic Drugs)   No current facility-administered medications for this visit. (Ophthalmic Drugs)   Current Outpatient Medications (Other)  Medication Sig   cloNIDine (CATAPRES) 0.1 MG tablet Take 1 tablet (0.1 mg total) by mouth 2 (two) times daily.   furosemide (LASIX) 20 MG tablet Take 1 tablet (20 mg total) by mouth daily.   lisinopril (ZESTRIL) 40 MG tablet TAKE 1 TABLET BY MOUTH  DAILY   metroNIDAZOLE (METROGEL) 0.75 % gel Apply 1 application topically daily as needed.   SYNTHROID 75 MCG tablet TAKE 1 TABLET BY MOUTH  DAILY   No current facility-administered medications for this visit. (Other)      REVIEW OF SYSTEMS: ROS   Positive for: Gastrointestinal, Musculoskeletal, Eyes Negative for: Constitutional, Neurological, Skin, Genitourinary, HENT, Endocrine, Cardiovascular, Respiratory, Psychiatric, Allergic/Imm, Heme/Lymph Last  edited by Theodore Demark, COA on 02/05/2021  9:47 AM.         ALLERGIES Allergies  Allergen Reactions   Codeine Phosphate     REACTION: unspecified    PAST MEDICAL HISTORY Past Medical History:  Diagnosis Date   BREAST BIOPSY, HX OF 11/27/2006   COLONIC POLYPS, HX OF 11/27/2006   HEMORRHOIDS 01/17/2010   HYPERTENSION 11/27/2006   Hypertensive retinopathy    OU   HYPOTHYROIDISM 11/27/2006   Macular degeneration    Dry OU   MENOPAUSAL SYNDROME 11/27/2006   Past Surgical History:  Procedure Laterality Date   ABDOMINAL HYSTERECTOMY     APPENDECTOMY     BREAST EXCISIONAL BIOPSY Right 1987   BREAST SURGERY     bx   CATARACT EXTRACTION Bilateral    EYE SURGERY Bilateral    Cat Sx OU   TONSILLECTOMY      FAMILY HISTORY Family History  Problem Relation Age of Onset   Heart disease Father     SOCIAL HISTORY Social History   Tobacco Use   Smoking status: Never   Smokeless tobacco: Never  Substance Use Topics   Alcohol use: Yes    Comment: rarely   Drug use: No         OPHTHALMIC EXAM:  Base Eye Exam     Visual Acuity (Snellen - Linear)       Right Left   Dist cc 20/25 -2 20/40 +1   Dist ph cc NI NI  Correction: Glasses         Tonometry (Tonopen, 9:44 AM)       Right Left   Pressure 15 17         Pupils       Dark Light Shape React APD   Right 3 2 Round Brisk None   Left 3 2 Round Brisk None         Visual Fields (Counting fingers)       Left Right    Full Full         Extraocular Movement       Right Left    Full, Ortho Full, Ortho         Neuro/Psych     Oriented x3: Yes   Mood/Affect: Normal         Dilation     Both eyes: 1.0% Mydriacyl, 2.5% Phenylephrine @ 9:44 AM           Slit Lamp and Fundus Exam     Slit Lamp Exam       Right Left   Lids/Lashes Dermatochalasis - upper lid Dermatochalasis - upper lid, mild Meibomian gland dysfunction   Conjunctiva/Sclera White and quiet White and quiet    Cornea 2-3+ inferior Punctate epithelial erosions, well healed temporal cataract wounds, arcus 2-3+ inferior Punctate epithelial erosions, well healed temporal cataract wounds, arcus   Anterior Chamber Deep and quiet Deep and quiet   Iris Round and dilated Round and dilated   Lens 3 piece PC IOL in good position with open PC 3 piece PC IOL in good position, 1+PCO (non-central)   Vitreous Vitreous syneresis, Posterior vitreous detachment Vitreous syneresis, Posterior vitreous detachment         Fundus Exam       Right Left   Disc Pink and Sharp, Compact mild Pallor, Sharp rim, temporal PPA, Compact   C/D Ratio 0.3 0.2   Macula Flat, Blunted foveal reflex, mild Drusen, RPE mottling and clumping, no heme Blunted foveal reflex, scattered IRH/DBH -- improved, +laser changes, focal edema temporal macula -- improved, Drusen, +focal exudates temporal mac   Vessels attenuated, Tortuous attenuated, Tortuous, severe attenuation of vessels superior macula   Periphery Attached, No heme  Attached, mild reticular degeneration           Refraction     Wearing Rx       Sphere Cylinder Axis Add   Right -1.00 +0.50 027 +2.50   Left -0.75 +0.75 083 +2.50    Type: progressive         Wearing Rx #2       Sphere Cylinder Axis Add   Right -1.00 +0.50 027 +2.50   Left -0.75 +0.75 083 +2.50    Type: progressive            IMAGING AND PROCEDURES  Imaging and Procedures for 02/05/2021  OCT, Retina - OU - Both Eyes       Right Eye Quality was good. Central Foveal Thickness: 267. Progression has been stable. Findings include normal foveal contour, no IRF, no SRF, retinal drusen .   Left Eye Quality was good. Central Foveal Thickness: 271. Progression has improved. Findings include normal foveal contour, intraretinal fluid, intraretinal hyper-reflective material, retinal drusen , no SRF (Persistent IRF temporal macula--slightly improved).   Notes *Images captured and stored on  drive  Diagnosis / Impression:  OD: non-exu ARMD w/ fine drusen OS: Persistent IRF temporal macula--slightly improved  Clinical management:  See below  Abbreviations:  NFP - Normal foveal profile. CME - cystoid macular edema. PED - pigment epithelial detachment. IRF - intraretinal fluid. SRF - subretinal fluid. EZ - ellipsoid zone. ERM - epiretinal membrane. ORA - outer retinal atrophy. ORT - outer retinal tubulation. SRHM - subretinal hyper-reflective material. IRHM - intraretinal hyper-reflective material      Intravitreal Injection, Pharmacologic Agent - OS - Left Eye       Time Out 02/05/2021. 10:20 AM. Confirmed correct patient, procedure, site, and patient consented.   Anesthesia Topical anesthesia was used. Anesthetic medications included Lidocaine 2%, Proparacaine 0.5%.   Procedure Preparation included 5% betadine to ocular surface, eyelid speculum. A (32g ) needle was used.   Injection: 2 mg aflibercept 2 MG/0.05ML   Route: Intravitreal, Site: Left Eye   NDC: A3590391, Lot: [id number], Expiration date: 03/11/2021, Waste: 0.05 mL   Post-op Post injection exam found visual acuity of at least counting fingers. The patient tolerated the procedure well. There were no complications. The patient received written and verbal post procedure care education. Post injection medications were not given.            ASSESSMENT/PLAN:    ICD-10-CM   1. Branch retinal vein occlusion of left eye with macular edema  H34.8320 Intravitreal Injection, Pharmacologic Agent - OS - Left Eye    aflibercept (EYLEA) SOLN 2 mg    2. Retinal edema  H35.81 OCT, Retina - OU - Both Eyes    3. Essential hypertension  I10     4. Hypertensive retinopathy of both eyes  H35.033     5. Early dry stage nonexudative age-related macular degeneration of both eyes  H35.3131     6. Pseudophakia, both eyes  Z96.1     7. Glaucoma suspect of both eyes  H40.003      1,2. BRVO w/ CME OS             - S/P  IVA OS #1 (10.18.21), #2 (11.15.21), #3 (12.13.21), #4 (01.10.22), #5 (02.07.22), #6 (03.07.22), #7 (04.04.22), #8 (5.2.22) -- IVA resistance  - S/P IVE OS #1 (05.31.22) -- sample, #2 (6.28.22)             - S/P focal laser OS (02.16.22) - BCVA down to 20/40 from 20/30 - OCT shows persistent IRF temporal macula--slightly improved - recommend IVE #3 today OS, 7.26.22 - pt wishes to proceed - RBA of procedure discussed, questions answered - informed consent obtained and signed - see procedure note - Avastin informed consent obtained and signed, 10.18.21 (OS) - Eylea informed consent obtained and signed, 05.31.22 - Eylea4U paperwork completed 05.31.22 - F/U 4 weeks, DFE, OCT, possible injection   3,4. Hypertensive retinopathy OU - discussed importance of tight BP control - monitor  5. Age related macular degeneration, non-exudative, both eyes  - The incidence, anatomy, and pathology of dry AMD, risk of progression, and the AREDS and AREDS 2 study including smoking risks discussed with patient.  - Recommend amsler grid monitoring  6. Pseudophakia OU  - s/p CE/IOL  - IOL in good position, doing well  - monitor  7. Glaucoma Suspect  - IOP 15,17  - under the expert care of Dr. Jerline Pain  Ophthalmic Meds Ordered this visit:  Meds ordered this encounter  Medications   aflibercept (EYLEA) SOLN 2 mg      Return in about 4 weeks (around 03/05/2021) for 4 wk f/u for BRVO w/CME OS w/DFE/OCT/likely inj,.  There are no Patient Instructions on file for this visit.  This document serves as a record of services personally performed by Gardiner Sleeper, MD, PhD. It was created on their behalf by Estill Bakes, COT an ophthalmic technician. The creation of this record is the provider's dictation and/or activities during the visit.    Electronically signed by: Estill Bakes, COT 7.26.22 @ 1:03 PM   Gardiner Sleeper, M.D., Ph.D. Diseases & Surgery of the Retina and [place] 7.26.22  I have reviewed the above documentation for accuracy and completeness, and I agree with the above. Gardiner Sleeper, M.D., Ph.D. 02/05/21 1:03 PM   Abbreviations: M myopia (nearsighted); A astigmatism; H hyperopia (farsighted); P presbyopia; Mrx spectacle prescription;  CTL contact lenses; OD right eye; OS left eye; OU both eyes  XT exotropia; ET esotropia; PEK punctate epithelial keratitis; PEE punctate epithelial erosions; DES dry eye syndrome; MGD meibomian gland dysfunction; ATs artificial tears; PFAT's preservative free artificial tears; [place] nuclear sclerotic cataract; PSC posterior subcapsular cataract; ERM epi-retinal membrane; PVD posterior vitreous detachment; RD retinal detachment; DM diabetes mellitus; DR diabetic retinopathy; NPDR non-proliferative diabetic retinopathy; PDR proliferative diabetic retinopathy; CSME clinically significant macular edema; DME diabetic macular edema; dbh dot blot hemorrhages; CWS cotton wool spot; POAG primary open angle glaucoma; C/D cup-to-disc ratio; HVF humphrey visual field; GVF goldmann visual field; OCT optical coherence tomography; IOP intraocular pressure; BRVO Branch retinal vein occlusion; CRVO central retinal vein occlusion; CRAO central retinal artery occlusion; BRAO branch retinal artery occlusion; RT retinal tear; SB scleral buckle; PPV pars plana vitrectomy; VH Vitreous hemorrhage; PRP panretinal laser photocoagulation; IVK intravitreal kenalog; VMT vitreomacular traction; MH Macular hole;  NVD neovascularization of the disc; NVE neovascularization elsewhere; AREDS age related eye disease study; ARMD age related macular degeneration; POAG primary open angle glaucoma; EBMD epithelial/anterior basement membrane dystrophy; ACIOL anterior chamber intraocular lens; IOL intraocular lens; PCIOL posterior chamber intraocular lens; Phaco/IOL phacoemulsification with intraocular lens placement; [place] photorefractive keratectomy; LASIK  laser assisted in situ keratomileusis; HTN hypertension; DM diabetes mellitus; COPD chronic obstructive pulmonary disease

## 2021-03-01 ENCOUNTER — Other Ambulatory Visit: Payer: Self-pay

## 2021-03-01 ENCOUNTER — Ambulatory Visit: Payer: Medicare Other

## 2021-03-01 DIAGNOSIS — M818 Other osteoporosis without current pathological fracture: Secondary | ICD-10-CM

## 2021-03-01 MED ORDER — DENOSUMAB 60 MG/ML ~~LOC~~ SOSY
60.0000 mg | PREFILLED_SYRINGE | Freq: Once | SUBCUTANEOUS | Status: AC
  Administered 2021-03-01: 60 mg via SUBCUTANEOUS

## 2021-03-01 NOTE — Progress Notes (Signed)
[facility] Note  03/05/2021     CHIEF COMPLAINT Patient presents for Retina Follow Up   HISTORY OF PRESENT ILLNESS: Janice Small is a 87 y.o. female who presents to the clinic today for:  HPI     Retina Follow Up   Patient presents with  CRVO/BRVO.  In left eye.  Duration of 4 weeks.  Since onset it is stable.  I, the attending physician,  performed the HPI with the patient and updated documentation appropriately.        Comments   Pt here for 4 wk ret f/u BRVO OS. Pt states vision is about the same, no ocular pain or discomfort.      Last edited by Bernarda Caffey, MD on 03/05/2021 12:22 PM.    Patient hasn't noticed any changes in vision.     Referring physician: Isaac Bliss, Rayford Halsted, MD [place],  [place] [address]  HISTORICAL INFORMATION:   Selected notes from the MEDICAL RECORD NUMBER Referred by Dr. Jerline Pain for eval of BRVO OS LEE: 10.12.2021 Ocular Hx- Glc suspect   CURRENT MEDICATIONS: No current outpatient medications on file. (Ophthalmic Drugs)   No current facility-administered medications for this visit. (Ophthalmic Drugs)   Current Outpatient Medications (Other)  Medication Sig   cloNIDine (CATAPRES) 0.1 MG tablet Take 1 tablet (0.1 mg total) by mouth 2 (two) times daily.   furosemide (LASIX) 20 MG tablet Take 1 tablet (20 mg total) by mouth daily.   lisinopril (ZESTRIL) 40 MG tablet TAKE 1 TABLET BY MOUTH  DAILY   metroNIDAZOLE (METROGEL) 0.75 % gel Apply 1 application topically daily as needed.   SYNTHROID 75 MCG tablet TAKE 1 TABLET BY MOUTH  DAILY   No current facility-administered medications for this visit. (Other)   REVIEW OF SYSTEMS: ROS   Positive for: Gastrointestinal, Musculoskeletal, Eyes Negative for: Constitutional, Neurological, Skin, Genitourinary, HENT, Endocrine, Cardiovascular, Respiratory, Psychiatric, Allergic/Imm, Heme/Lymph Last edited by Kingsley Spittle, COT on  03/05/2021  9:56 AM.     ALLERGIES Allergies  Allergen Reactions   Codeine Phosphate     REACTION: unspecified    PAST MEDICAL HISTORY Past Medical History:  Diagnosis Date   BREAST BIOPSY, HX OF 11/27/2006   COLONIC POLYPS, HX OF 11/27/2006   HEMORRHOIDS 01/17/2010   HYPERTENSION 11/27/2006   Hypertensive retinopathy    OU   HYPOTHYROIDISM 11/27/2006   Macular degeneration    Dry OU   MENOPAUSAL SYNDROME 11/27/2006   Past Surgical History:  Procedure Laterality Date   ABDOMINAL HYSTERECTOMY     APPENDECTOMY     BREAST EXCISIONAL BIOPSY Right 1987   BREAST SURGERY     bx   CATARACT EXTRACTION Bilateral    EYE SURGERY Bilateral    Cat Sx OU   TONSILLECTOMY      FAMILY HISTORY Family History  Problem Relation Age of Onset   Heart disease Father     SOCIAL HISTORY Social History   Tobacco Use   Smoking status: Never   Smokeless tobacco: Never  Substance Use Topics   Alcohol use: Yes    Comment: rarely   Drug use: No         OPHTHALMIC EXAM:  Base Eye Exam     Visual Acuity (Snellen - Linear)       Right Left   Dist cc 20/25 20/40 -1   Dist ph cc 20/25 +1 NI    Correction: Glasses  Tonometry (Tonopen, 10:04 AM)       Right Left   Pressure 16 16         Pupils       Dark Light Shape React APD   Right 3 2 Round Brisk None   Left 3 2 Round Brisk None         Visual Fields (Counting fingers)       Left Right    Full Full         Extraocular Movement       Right Left    Full, Ortho Full, Ortho         Neuro/Psych     Oriented x3: Yes   Mood/Affect: Normal         Dilation     Both eyes: 1.0% Mydriacyl, 2.5% Phenylephrine @ 10:04 AM           Slit Lamp and Fundus Exam     Slit Lamp Exam       Right Left   Lids/Lashes Dermatochalasis - upper lid Dermatochalasis - upper lid, mild Meibomian gland dysfunction   Conjunctiva/Sclera White and quiet White and quiet   Cornea 2-3+ inferior Punctate epithelial  erosions, well healed temporal cataract wounds, arcus 2-3+ inferior Punctate epithelial erosions, well healed temporal cataract wounds, arcus   Anterior Chamber Deep and quiet Deep and quiet   Iris Round and dilated Round and dilated   Lens 3 piece PC IOL in good position with open PC 3 piece PC IOL in good position, 1+PCO (non-central)   Vitreous Vitreous syneresis, Posterior vitreous detachment Vitreous syneresis, Posterior vitreous detachment         Fundus Exam       Right Left   Disc Pink and Sharp, Compact mild Pallor, Sharp rim, temporal PPA, Compact   C/D Ratio 0.3 0.2   Macula Flat, Blunted foveal reflex, mild Drusen, RPE mottling and clumping, no heme Blunted foveal reflex, scattered IRH/DBH -- improved, +laser changes, focal edema temporal macula -- improved, Drusen, +focal exudates temporal mac   Vessels attenuated, Tortuous attenuated, Tortuous, severe attenuation of vessels superior macula   Periphery Attached, No heme  Attached, mild reticular degeneration           Refraction     Wearing Rx       Sphere Cylinder Axis Add   Right -1.00 +0.50 027 +2.50   Left -0.75 +0.75 083 +2.50    Type: progressive            IMAGING AND PROCEDURES  Imaging and Procedures for 03/05/2021  OCT, Retina - OU - Both Eyes       Right Eye Quality was good. Central Foveal Thickness: 269. Progression has been stable. Findings include normal foveal contour, no IRF, no SRF, retinal drusen .   Left Eye Quality was good. Central Foveal Thickness: 275. Progression has been stable. Findings include normal foveal contour, intraretinal fluid, intraretinal hyper-reflective material, retinal drusen , no SRF (Persistent IRF temporal macula).   Notes *Images captured and stored on drive  Diagnosis / Impression:  OD: non-exu ARMD w/ fine drusen OS: BRVO -- Persistent IRF temporal macula  Clinical management:  See below  Abbreviations: NFP - Normal foveal profile. CME - cystoid  macular edema. PED - pigment epithelial detachment. IRF - intraretinal fluid. SRF - subretinal fluid. EZ - ellipsoid zone. ERM - epiretinal membrane. ORA - outer retinal atrophy. ORT - outer retinal tubulation. SRHM - subretinal hyper-reflective material. IRHM - intraretinal  hyper-reflective material      Intravitreal Injection, Pharmacologic Agent - OS - Left Eye       Time Out 03/05/2021. 11:00 AM. Confirmed correct patient, procedure, site, and patient consented.   Anesthesia Topical anesthesia was used. Anesthetic medications included Lidocaine 2%, Proparacaine 0.5%.   Procedure Preparation included 5% betadine to ocular surface, eyelid speculum. A (32 g) needle was used.   Injection: 2 mg aflibercept 2 MG/0.05ML   Route: Intravitreal, Site: Left Eye   NDC: [id number], Lot: [id number], Expiration date: 12/11/2021, Waste: 0.05 mL   Post-op Post injection exam found visual acuity of at least counting fingers. The patient tolerated the procedure well. There were no complications. The patient received written and verbal post procedure care education. Post injection medications were not given.            ASSESSMENT/PLAN:    ICD-10-CM   1. Branch retinal vein occlusion of left eye with macular edema  H34.8320 Intravitreal Injection, Pharmacologic Agent - OS - Left Eye    aflibercept (EYLEA) SOLN 2 mg    2. Retinal edema  H35.81 OCT, Retina - OU - Both Eyes    3. Essential hypertension  I10     4. Hypertensive retinopathy of both eyes  H35.033     5. Early dry stage nonexudative age-related macular degeneration of both eyes  H35.3131     6. Pseudophakia, both eyes  Z96.1     7. Glaucoma suspect of both eyes  H40.003      1,2. BRVO w/ CME OS             - S/P IVA OS #1 (10.18.21), #2 (11.15.21), #3 (12.13.21), #4 (01.10.22), #5 (02.07.22), #6 (03.07.22), #7 (04.04.22), #8 (5.2.22) -- IVA resistance  - S/P IVE OS #1 (05.31.22) -- sample, #2 (06.28.22), #3 (07.26.22)              - S/P focal laser OS (02.16.22) - BCVA stable at 20/40 - OCT shows persistent IRF temporal macula - recommend IVE #4 today OS, 08.23.22 - pt wishes to proceed - RBA of procedure discussed, questions answered - informed consent obtained and signed - see procedure note - Avastin informed consent obtained and signed, 10.18.21 (OS) - Eylea informed consent obtained and signed, 05.31.22 - Eylea4U paperwork completed 05.31.22 - F/U 4 weeks, DFE, OCT, possible injection   3,4. Hypertensive retinopathy OU - discussed importance of tight BP control - monitor  5. Age related macular degeneration, non-exudative, both eyes  - The incidence, anatomy, and pathology of dry AMD, risk of progression, and the AREDS and AREDS 2 study including smoking risks discussed with patient.  - Recommend amsler grid monitoring  6. Pseudophakia OU  - s/p CE/IOL  - IOL in good position, doing well  - monitor  7. Glaucoma Suspect  - IOP 16,16  - under the expert care of Dr. Jerline Pain  Ophthalmic Meds Ordered this visit:  Meds ordered this encounter  Medications   aflibercept (EYLEA) SOLN 2 mg       Return in about 4 weeks (around 04/02/2021) for DFE, OCT, possible injection.  There are no Patient Instructions on file for this visit.  This document serves as a record of services personally performed by Gardiner Sleeper, MD, PhD. It was created on their behalf by San Jetty. Owens Shark, OA an ophthalmic technician. The creation of this record is the provider's dictation and/or activities during the visit.    Electronically signed by: San Jetty. Owens Shark, OA  08.19.2022 12:25 PM  This document serves as a record of services personally performed by Gardiner Sleeper, MD, PhD. It was created on their behalf by Leonie Douglas, an ophthalmic technician. The creation of this record is the provider's dictation and/or activities during the visit.    Electronically signed by: Leonie Douglas COA, 03/05/21  12:25 PM   Gardiner Sleeper, M.D., Ph.D. Diseases & Surgery of the Retina and Vitreous [facility]  I have reviewed the above documentation for accuracy and completeness, and I agree with the above. Gardiner Sleeper, M.D., Ph.D. 03/05/21 12:40 PM   Abbreviations: M myopia (nearsighted); A astigmatism; H hyperopia (farsighted); P presbyopia; Mrx spectacle prescription;  CTL contact lenses; OD right eye; OS left eye; OU both eyes  XT exotropia; ET esotropia; PEK punctate epithelial keratitis; PEE punctate epithelial erosions; DES dry eye syndrome; MGD meibomian gland dysfunction; ATs artificial tears; PFAT's preservative free artificial tears; [place] nuclear sclerotic cataract; PSC posterior subcapsular cataract; ERM epi-retinal membrane; PVD posterior vitreous detachment; RD retinal detachment; DM diabetes mellitus; DR diabetic retinopathy; NPDR non-proliferative diabetic retinopathy; PDR proliferative diabetic retinopathy; CSME clinically significant macular edema; DME diabetic macular edema; dbh dot blot hemorrhages; CWS cotton wool spot; POAG primary open angle glaucoma; C/D cup-to-disc ratio; HVF humphrey visual field; GVF goldmann visual field; OCT optical coherence tomography; IOP intraocular pressure; BRVO Branch retinal vein occlusion; CRVO central retinal vein occlusion; CRAO central retinal artery occlusion; BRAO branch retinal artery occlusion; RT retinal tear; SB scleral buckle; PPV pars plana vitrectomy; VH Vitreous hemorrhage; PRP panretinal laser photocoagulation; IVK intravitreal kenalog; VMT vitreomacular traction; MH Macular hole;  NVD neovascularization of the disc; NVE neovascularization elsewhere; AREDS age related eye disease study; ARMD age related macular degeneration; POAG primary open angle glaucoma; EBMD epithelial/anterior basement membrane dystrophy; ACIOL anterior chamber intraocular lens; IOL intraocular lens; PCIOL posterior chamber intraocular lens; Phaco/IOL  phacoemulsification with intraocular lens placement; [place] photorefractive keratectomy; LASIK laser assisted in situ keratomileusis; HTN hypertension; DM diabetes mellitus; COPD chronic obstructive pulmonary disease

## 2021-03-01 NOTE — Progress Notes (Signed)
Administered Prolia injection in patients right arm. Patient tolerated well.

## 2021-03-04 NOTE — Telephone Encounter (Signed)
Pt received Prolia inj 03/01/21 Next Prolia inj due 09/02/21

## 2021-03-05 ENCOUNTER — Encounter (INDEPENDENT_AMBULATORY_CARE_PROVIDER_SITE_OTHER): Payer: Self-pay | Admitting: Ophthalmology

## 2021-03-05 ENCOUNTER — Ambulatory Visit (INDEPENDENT_AMBULATORY_CARE_PROVIDER_SITE_OTHER): Payer: Medicare Other | Admitting: Ophthalmology

## 2021-03-05 ENCOUNTER — Other Ambulatory Visit: Payer: Self-pay

## 2021-03-05 DIAGNOSIS — H35033 Hypertensive retinopathy, bilateral: Secondary | ICD-10-CM | POA: Diagnosis not present

## 2021-03-05 DIAGNOSIS — I10 Essential (primary) hypertension: Secondary | ICD-10-CM | POA: Diagnosis not present

## 2021-03-05 DIAGNOSIS — H3581 Retinal edema: Secondary | ICD-10-CM

## 2021-03-05 DIAGNOSIS — H353131 Nonexudative age-related macular degeneration, bilateral, early dry stage: Secondary | ICD-10-CM

## 2021-03-05 DIAGNOSIS — H34832 Tributary (branch) retinal vein occlusion, left eye, with macular edema: Secondary | ICD-10-CM | POA: Diagnosis not present

## 2021-03-05 DIAGNOSIS — Z961 Presence of intraocular lens: Secondary | ICD-10-CM

## 2021-03-05 DIAGNOSIS — H40003 Preglaucoma, unspecified, bilateral: Secondary | ICD-10-CM

## 2021-03-05 MED ORDER — AFLIBERCEPT 2MG/0.05ML IZ SOLN FOR KALEIDOSCOPE
2.0000 mg | INTRAVITREAL | Status: AC | PRN
  Administered 2021-03-05: 2 mg via INTRAVITREAL

## 2021-03-08 ENCOUNTER — Other Ambulatory Visit: Payer: Self-pay | Admitting: Internal Medicine

## 2021-03-08 DIAGNOSIS — E039 Hypothyroidism, unspecified: Secondary | ICD-10-CM

## 2021-03-08 DIAGNOSIS — I10 Essential (primary) hypertension: Secondary | ICD-10-CM

## 2021-03-13 ENCOUNTER — Other Ambulatory Visit: Payer: Self-pay

## 2021-03-14 ENCOUNTER — Ambulatory Visit (INDEPENDENT_AMBULATORY_CARE_PROVIDER_SITE_OTHER)
Admission: RE | Admit: 2021-03-14 | Discharge: 2021-03-14 | Disposition: A | Discharge status: Home / Self Care | Payer: Medicare Other | Source: Ambulatory Visit | Attending: Internal Medicine | Admitting: Internal Medicine

## 2021-03-14 ENCOUNTER — Ambulatory Visit: Payer: Medicare Other | Admitting: Internal Medicine

## 2021-03-14 ENCOUNTER — Encounter: Payer: Self-pay | Admitting: Internal Medicine

## 2021-03-14 VITALS — BP 180/100 | HR 70 | Temp 98.0°F | Wt 148.5 lb

## 2021-03-14 DIAGNOSIS — Z23 Encounter for immunization: Secondary | ICD-10-CM

## 2021-03-14 DIAGNOSIS — I10 Essential (primary) hypertension: Secondary | ICD-10-CM

## 2021-03-14 DIAGNOSIS — M419 Scoliosis, unspecified: Secondary | ICD-10-CM | POA: Diagnosis not present

## 2021-03-14 DIAGNOSIS — G8929 Other chronic pain: Secondary | ICD-10-CM

## 2021-03-14 DIAGNOSIS — M545 Low back pain, unspecified: Secondary | ICD-10-CM

## 2021-03-14 NOTE — Progress Notes (Signed)
Established Patient Office Visit     This visit occurred during the SARS-CoV-2 public health emergency.  Safety protocols were in place, including screening questions prior to the visit, additional usage of staff PPE, and extensive cleaning of exam room while observing appropriate contact time as indicated for disinfecting solutions.    CC/Reason for Visit: Discuss chronic conditions  HPI: Janice Small is a 87 y.o. female who is coming in today for the above mentioned reasons. Past Medical History is significant for: Hypertension with a significant whitecoat syndrome element, osteoporosis, vitamin D deficiency, hypothyroidism.  She is here today mainly to follow-up on her blood pressure.  She brings in her ambulatory blood pressure measurements as follow:  128/75 132/75 128/76 112/73 130/85  As usual her in office measurements are very elevated to the tune of 180/100 today.  In the past we have checked the accuracy of her home blood pressure monitor.  She is also requesting x-rays of her spine.  She has been visiting a chiropractor for years that is requesting them.  She tells me she has a history of scoliosis.  She is not interested in an orthopedic referral.   Past Medical/Surgical History: Past Medical History:  Diagnosis Date   BREAST BIOPSY, HX OF 11/27/2006   COLONIC POLYPS, HX OF 11/27/2006   HEMORRHOIDS 01/17/2010   HYPERTENSION 11/27/2006   Hypertensive retinopathy    OU   HYPOTHYROIDISM 11/27/2006   Macular degeneration    Dry OU   MENOPAUSAL SYNDROME 11/27/2006    Past Surgical History:  Procedure Laterality Date   ABDOMINAL HYSTERECTOMY     APPENDECTOMY     BREAST EXCISIONAL BIOPSY Right 1987   BREAST SURGERY     bx   CATARACT EXTRACTION Bilateral    EYE SURGERY Bilateral    Cat Sx OU   TONSILLECTOMY      Social History:  reports that she has never smoked. She has never used smokeless tobacco. She reports current alcohol use. She reports that she does  not use drugs.  Allergies: Allergies  Allergen Reactions   Codeine Phosphate     REACTION: unspecified    Family History:  Family History  Problem Relation Age of Onset   Heart disease Father      Current Outpatient Medications:    cloNIDine (CATAPRES) 0.1 MG tablet, Take 1 tablet (0.1 mg total) by mouth 2 (two) times daily., Disp: 180 tablet, Rfl: 1   furosemide (LASIX) 20 MG tablet, TAKE 1 TABLET BY MOUTH  DAILY, Disp: 90 tablet, Rfl: 0   lisinopril (ZESTRIL) 40 MG tablet, TAKE 1 TABLET BY MOUTH  DAILY, Disp: 90 tablet, Rfl: 0   metroNIDAZOLE (METROGEL) 0.75 % gel, Apply 1 application topically daily as needed., Disp: , Rfl:    SYNTHROID 75 MCG tablet, TAKE 1 TABLET BY MOUTH  DAILY, Disp: 90 tablet, Rfl: 0  Review of Systems:  Constitutional: Denies fever, chills, diaphoresis, appetite change and fatigue.  HEENT: Denies photophobia, eye pain, redness, hearing loss, ear pain, congestion, sore throat, rhinorrhea, sneezing, mouth sores, trouble swallowing, neck pain, neck stiffness and tinnitus.   Respiratory: Denies SOB, DOE, cough, chest tightness,  and wheezing.   Cardiovascular: Denies chest pain, palpitations and leg swelling.  Gastrointestinal: Denies nausea, vomiting, abdominal pain, diarrhea, constipation, blood in stool and abdominal distention.  Genitourinary: Denies dysuria, urgency, frequency, hematuria, flank pain and difficulty urinating.  Endocrine: Denies: hot or cold intolerance, sweats, changes in hair or nails, polyuria, polydipsia. Musculoskeletal: Positive for  myalgias, back pain, joint swelling, arthralgias and gait problem.  Skin: Denies pallor, rash and wound.  Neurological: Denies dizziness, seizures, syncope, weakness, light-headedness, numbness and headaches.  Hematological: Denies adenopathy. Easy bruising, personal or family bleeding history  Psychiatric/Behavioral: Denies suicidal ideation, mood changes, confusion, nervousness, sleep disturbance and  agitation    Physical Exam: Vitals:   03/14/21 1002  BP: (!) 180/100  Pulse: 70  Temp: 98 F (36.7 C)  TempSrc: Oral  SpO2: 97%  Weight: 148 lb 8 oz (67.4 kg)    Body mass index is 29 kg/m.   Constitutional: NAD, calm, comfortable, ambulates with a cane Eyes: PERRL, lids and conjunctivae normal, wears corrective lenses ENMT: Mucous membranes are moist.  Respiratory: clear to auscultation bilaterally, no wheezing, no crackles. Normal respiratory effort. No accessory muscle use.  Cardiovascular: Regular rate and rhythm, no murmurs / rubs / gallops. No extremity edema.  Psychiatric: Normal judgment and insight. Alert and oriented x 3. Normal mood.    Impression and Plan:  Essential hypertension -She has a very well-documented element of whitecoat syndrome. -She is currently on furosemide 20 mg daily, lisinopril 40 mg daily and clonidine 0.1 mg twice daily. -Despite very elevated in office blood pressure measurement, home measurements are well controlled.  Scoliosis, unspecified scoliosis type, unspecified spinal region  Chronic bilateral low back pain without sciatica - Plan: DG Cervical Spine Complete, DG THORACOLUMABAR SPINE per patient request, she is adamant on obtaining these even though I am not sure that the results will be useful. -She has declined orthopedic referral today.  Time spent: 31 minutes reviewing chart, interviewing patient and son, examining patient and formulating plan of care.     [name], MD [facility] at [facility]

## 2021-03-14 NOTE — Addendum Note (Signed)
Addended by: Westley Hummer B on: 03/14/2021 04:28 PM   Modules accepted: Orders

## 2021-03-25 ENCOUNTER — Other Ambulatory Visit: Payer: Self-pay

## 2021-03-26 ENCOUNTER — Ambulatory Visit: Payer: Medicare Other | Admitting: Internal Medicine

## 2021-03-26 ENCOUNTER — Encounter: Payer: Self-pay | Admitting: Internal Medicine

## 2021-03-26 VITALS — BP 160/90 | HR 84 | Temp 98.1°F

## 2021-03-26 DIAGNOSIS — E039 Hypothyroidism, unspecified: Secondary | ICD-10-CM | POA: Diagnosis not present

## 2021-03-26 DIAGNOSIS — K130 Diseases of lips: Secondary | ICD-10-CM | POA: Diagnosis not present

## 2021-03-26 DIAGNOSIS — I10 Essential (primary) hypertension: Secondary | ICD-10-CM | POA: Diagnosis not present

## 2021-03-26 MED ORDER — LEVOTHYROXINE SODIUM 75 MCG PO TABS
75.0000 ug | ORAL_TABLET | Freq: Every day | ORAL | 1 refills | Status: DC
Start: 2021-03-26 — End: 2021-07-18

## 2021-03-26 MED ORDER — METRONIDAZOLE 0.75 % EX GEL: 1.0000 "application " | Freq: Every day | CUTANEOUS | 1 refills | Status: DC | PRN

## 2021-03-26 MED ORDER — LISINOPRIL 40 MG PO TABS: 40.0000 mg | ORAL_TABLET | Freq: Every day | ORAL | 1 refills | Status: DC

## 2021-03-26 MED ORDER — FUROSEMIDE 20 MG PO TABS: 20.0000 mg | ORAL_TABLET | Freq: Every day | ORAL | 1 refills | Status: DC

## 2021-03-26 NOTE — Addendum Note (Signed)
Addended by: Westley Hummer B on: 03/26/2021 03:29 PM   Modules accepted: Orders

## 2021-03-26 NOTE — Progress Notes (Signed)
Established Patient Office Visit     This visit occurred during the SARS-CoV-2 public health emergency.  Safety protocols were in place, including screening questions prior to the visit, additional usage of staff PPE, and extensive cleaning of exam room while observing appropriate contact time as indicated for disinfecting solutions.    CC/Reason for Visit: Chapped lips  HPI: Janice Small is a 87 y.o. female who is coming in today for the above mentioned reasons. Past Medical History is significant for: Hypertension with a well documented component of whitecoat syndrome.  She was started on clonidine a few months ago.  She is here today because she believes she is having an allergic reaction to it.  She describes dry lips that are chapped, she has also noticed increased postnasal drip and runny nose.   Past Medical/Surgical History: Past Medical History:  Diagnosis Date   BREAST BIOPSY, HX OF 11/27/2006   COLONIC POLYPS, HX OF 11/27/2006   HEMORRHOIDS 01/17/2010   HYPERTENSION 11/27/2006   Hypertensive retinopathy    OU   HYPOTHYROIDISM 11/27/2006   Macular degeneration    Dry OU   MENOPAUSAL SYNDROME 11/27/2006    Past Surgical History:  Procedure Laterality Date   ABDOMINAL HYSTERECTOMY     APPENDECTOMY     BREAST EXCISIONAL BIOPSY Right 1987   BREAST SURGERY     bx   CATARACT EXTRACTION Bilateral    EYE SURGERY Bilateral    Cat Sx OU   TONSILLECTOMY      Social History:  reports that she has never smoked. She has never used smokeless tobacco. She reports current alcohol use. She reports that she does not use drugs.  Allergies: Allergies  Allergen Reactions   Codeine Phosphate     REACTION: unspecified    Family History:  Family History  Problem Relation Age of Onset   Heart disease Father      Current Outpatient Medications:    cloNIDine (CATAPRES) 0.1 MG tablet, Take 1 tablet (0.1 mg total) by mouth 2 (two) times daily., Disp: 180 tablet, Rfl: 1    furosemide (LASIX) 20 MG tablet, TAKE 1 TABLET BY MOUTH  DAILY, Disp: 90 tablet, Rfl: 0   lisinopril (ZESTRIL) 40 MG tablet, TAKE 1 TABLET BY MOUTH  DAILY, Disp: 90 tablet, Rfl: 0   metroNIDAZOLE (METROGEL) 0.75 % gel, Apply 1 application topically daily as needed., Disp: , Rfl:    SYNTHROID 75 MCG tablet, TAKE 1 TABLET BY MOUTH  DAILY, Disp: 90 tablet, Rfl: 0  Review of Systems:  Constitutional: Denies fever, chills, diaphoresis, appetite change and fatigue.  HEENT: Denies photophobia, eye pain, redness, hearing loss, ear pain,  sore throat,  sneezing, mouth sores, trouble swallowing, neck pain, neck stiffness and tinnitus.   Respiratory: Denies SOB, DOE, cough, chest tightness,  and wheezing.   Cardiovascular: Denies chest pain, palpitations and leg swelling.  Gastrointestinal: Denies nausea, vomiting, abdominal pain, diarrhea, constipation, blood in stool and abdominal distention.  Genitourinary: Denies dysuria, urgency, frequency, hematuria, flank pain and difficulty urinating.  Endocrine: Denies: hot or cold intolerance, sweats, changes in hair or nails, polyuria, polydipsia. Musculoskeletal: Denies myalgias, back pain, joint swelling, arthralgias and gait problem.  Skin: Denies pallor, rash and wound.  Neurological: Denies dizziness, seizures, syncope, weakness, light-headedness, numbness and headaches.  Hematological: Denies adenopathy. Easy bruising, personal or family bleeding history  Psychiatric/Behavioral: Denies suicidal ideation, mood changes, confusion, nervousness, sleep disturbance and agitation    Physical Exam: Vitals:   03/26/21 1423  BP: (!) 160/90  Pulse: 84  Temp: 98.1 F (36.7 C)  TempSrc: Oral  SpO2: 98%    There is no height or weight on file to calculate BMI.   Constitutional: NAD, calm, comfortable Eyes: PERRL, lids and conjunctivae normal, wears corrective lenses ENMT: Mucous membranes are Psychiatric: Normal judgment and insight. Alert and oriented  x 3. Normal mood.    Impression and Plan:  Chapped lips -While certainly clonidine can contribute to dry mouth, I believe she likely has postnasal drip and nasal congestion leading to mouth breathing at night that is exacerbating her dry chapped lips. -Instead of discontinuing clonidine, as we have had such a hard time finding an appropriate blood pressure medication for her, I would suggest using Chapstick and Vaseline as well as frequent sips of water. -She will notify me if no improvement.  Time spent: 22 minutes reviewing chart, interviewing patient and son, examining patient and formulating plan of     [name] Isaac Bliss, MD [facility] at [facility]

## 2021-03-27 ENCOUNTER — Telehealth: Payer: Self-pay | Admitting: Internal Medicine

## 2021-03-27 NOTE — Telephone Encounter (Signed)
PT is calling to request the complete report of her last imaging that was done on Sept 1st. When it was mailed to her she only got the 1st page but is missing the other 3 pages. PT would like to get this again but with all 4 pages mail to her. She only saw something about her neck but nothing about her spine. Please advise.

## 2021-03-28 NOTE — Telephone Encounter (Signed)
Spoke with patient and explained that 1 result was not complete.  I also explained that orders was on some pages and not needed.  I will mail a copy of both results per patient request.

## 2021-04-01 NOTE — Progress Notes (Signed)
[facility] Note  04/02/2021     CHIEF COMPLAINT Patient presents for Retina Follow Up   HISTORY OF PRESENT ILLNESS: Janice Small is a 87 y.o. female who presents to the clinic today for:  HPI     Retina Follow Up   Patient presents with  CRVO/BRVO.  In left eye.  This started 4 weeks ago.  I, the attending physician,  performed the HPI with the patient and updated documentation appropriately.        Comments   Patient here for 4 weeks for retina follow up for BRVO OS. Patient states vision cant tell much difference. OD is still a little red in the corner. Uses AT's doesn't help. No eye pain.      Last edited by Bernarda Caffey, MD on 04/03/2021  4:54 PM.    Pt states in her day to day life she cannot tell if her vision is any better, but reading the eye chart today she noticed that her left eye vision has improved   Referring physician: No referring provider defined for this encounter.  HISTORICAL INFORMATION:   Selected notes from the MEDICAL RECORD NUMBER Referred by Dr. Jerline Pain for eval of BRVO OS LEE: 10.12.2021 Ocular Hx- Glc suspect   CURRENT MEDICATIONS: No current outpatient medications on file. (Ophthalmic Drugs)   No current facility-administered medications for this visit. (Ophthalmic Drugs)   Current Outpatient Medications (Other)  Medication Sig   cloNIDine (CATAPRES) 0.1 MG tablet TAKE 1 TABLET BY MOUTH 2 TIMES DAILY.   furosemide (LASIX) 20 MG tablet Take 1 tablet (20 mg total) by mouth daily.   levothyroxine (SYNTHROID) 75 MCG tablet Take 1 tablet (75 mcg total) by mouth daily.   lisinopril (ZESTRIL) 40 MG tablet Take 1 tablet (40 mg total) by mouth daily.   metroNIDAZOLE (METROGEL) 0.75 % gel Apply 1 application topically daily as needed.   No current facility-administered medications for this visit. (Other)   REVIEW OF SYSTEMS: ROS   Positive for: Gastrointestinal, Musculoskeletal, Eyes Negative for:  Constitutional, Neurological, Skin, Genitourinary, HENT, Endocrine, Cardiovascular, Respiratory, Psychiatric, Allergic/Imm, Heme/Lymph Last edited by Theodore Demark, COA on 04/02/2021  9:55 AM.      ALLERGIES Allergies  Allergen Reactions   Codeine Phosphate     REACTION: unspecified    PAST MEDICAL HISTORY Past Medical History:  Diagnosis Date   BREAST BIOPSY, HX OF 11/27/2006   COLONIC POLYPS, HX OF 11/27/2006   HEMORRHOIDS 01/17/2010   HYPERTENSION 11/27/2006   Hypertensive retinopathy    OU   HYPOTHYROIDISM 11/27/2006   Macular degeneration    Dry OU   MENOPAUSAL SYNDROME 11/27/2006   Past Surgical History:  Procedure Laterality Date   ABDOMINAL HYSTERECTOMY     APPENDECTOMY     BREAST EXCISIONAL BIOPSY Right 1987   BREAST SURGERY     bx   CATARACT EXTRACTION Bilateral    EYE SURGERY Bilateral    Cat Sx OU   TONSILLECTOMY      FAMILY HISTORY Family History  Problem Relation Age of Onset   Heart disease Father     SOCIAL HISTORY Social History   Tobacco Use   Smoking status: Never   Smokeless tobacco: Never  Substance Use Topics   Alcohol use: Yes    Comment: rarely   Drug use: No         OPHTHALMIC EXAM:  Base Eye Exam     Visual Acuity (Snellen - Linear)  Right Left   Dist cc 20/20 20/40 -1   Dist ph cc  20/25 -2    Correction: Glasses         Tonometry (Tonopen, 9:51 AM)       Right Left   Pressure 19 17         Pupils       Dark Light Shape React APD   Right 3 2 Round Brisk None   Left 3 2 Round Brisk None         Visual Fields (Counting fingers)       Left Right     Full         Extraocular Movement       Right Left    Full, Ortho Full, Ortho         Neuro/Psych     Oriented x3: Yes   Mood/Affect: Normal         Dilation     Both eyes: 1.0% Mydriacyl, 2.5% Phenylephrine @ 9:51 AM           Slit Lamp and Fundus Exam     Slit Lamp Exam       Right Left   Lids/Lashes Dermatochalasis  - upper lid Dermatochalasis - upper lid, mild Meibomian gland dysfunction   Conjunctiva/Sclera White and quiet White and quiet   Cornea 1-2+ fine Punctate epithelial erosions, well healed temporal cataract wounds, arcus, early band K nasal and temporal 2-3+ inferior Punctate epithelial erosions, well healed temporal cataract wounds, arcus   Anterior Chamber Deep and quiet Deep and quiet   Iris Round and dilated Round and dilated   Lens 3 piece PC IOL in good position with open PC 3 piece PC IOL in good position, 1+PCO (non-central)   Vitreous Vitreous syneresis, Posterior vitreous detachment Vitreous syneresis, Posterior vitreous detachment         Fundus Exam       Right Left   Disc Pink and Sharp, Compact mild Pallor, Sharp rim, temporal PPA, Compact   C/D Ratio 0.3 0.2   Macula Flat, Blunted foveal reflex, mild Drusen, RPE mottling and clumping, no heme Blunted foveal reflex, scattered IRH/DBH -- improved, +laser changes, focal edema temporal macula -- improved, Drusen, +focal exudates temporal mac   Vessels attenuated, Tortuous attenuated, Tortuous, severe attenuation of vessels superior macula   Periphery Attached, No heme  Attached, mild reticular degeneration           Refraction     Wearing Rx       Sphere Cylinder Axis Add   Right -1.00 +0.50 027 +2.50   Left -0.75 +0.75 083 +2.50    Type: progressive            IMAGING AND PROCEDURES  Imaging and Procedures for 04/02/2021  OCT, Retina - OU - Both Eyes       Right Eye Quality was good. Central Foveal Thickness: 271. Progression has been stable. Findings include normal foveal contour, no IRF, no SRF, retinal drusen .   Left Eye Quality was good. Central Foveal Thickness: 269. Progression has improved. Findings include normal foveal contour, intraretinal fluid, intraretinal hyper-reflective material, retinal drusen , no SRF (Interval improvement in IRF/IRHM temporal macula).   Notes *Images captured and  stored on drive  Diagnosis / Impression:  OD: non-exu ARMD w/ fine drusen OS: BRVO -- Interval improvement in IRF/IRHM temporal macula  Clinical management:  See below  Abbreviations: NFP - Normal foveal profile. CME - cystoid macular edema.  PED - pigment epithelial detachment. IRF - intraretinal fluid. SRF - subretinal fluid. EZ - ellipsoid zone. ERM - epiretinal membrane. ORA - outer retinal atrophy. ORT - outer retinal tubulation. SRHM - subretinal hyper-reflective material. IRHM - intraretinal hyper-reflective material      Intravitreal Injection, Pharmacologic Agent - OS - Left Eye       Time Out 04/02/2021. 10:32 AM. Confirmed correct patient, procedure, site, and patient consented.   Anesthesia Topical anesthesia was used. Anesthetic medications included Lidocaine 2%, Proparacaine 0.5%.   Procedure Preparation included 5% betadine to ocular surface, eyelid speculum. A (32 g) needle was used.   Injection: 2 mg aflibercept 2 MG/0.05ML   Route: Intravitreal, Site: Left Eye   NDC: [id number], Lot: [id number], Expiration date: 01/10/2022, Waste: 0.05 mL   Post-op Post injection exam found visual acuity of at least counting fingers. The patient tolerated the procedure well. There were no complications. The patient received written and verbal post procedure care education. Post injection medications were not given.            ASSESSMENT/PLAN:    ICD-10-CM   1. Branch retinal vein occlusion of left eye with macular edema  H34.8320 Intravitreal Injection, Pharmacologic Agent - OS - Left Eye    aflibercept (EYLEA) SOLN 2 mg    2. Retinal edema  H35.81 OCT, Retina - OU - Both Eyes    3. Essential hypertension  I10     4. Hypertensive retinopathy of both eyes  H35.033     5. Early dry stage nonexudative age-related macular degeneration of both eyes  H35.3131     6. Pseudophakia, both eyes  Z96.1     7. Glaucoma suspect of both eyes  H40.003      1,2. BRVO w/ CME  OS             - S/P IVA OS #1 (10.18.21), #2 (11.15.21), #3 (12.13.21), #4 (01.10.22), #5 (02.07.22), #6 (03.07.22), #7 (04.04.22), #8 (5.2.22) -- IVA resistance  - S/P IVE OS #1 (05.31.22) -- sample, #2 (06.28.22), #3 (07.26.22), #4 (8.23.22)             - S/P focal laser OS (02.16.22) - BCVA stable at 20/40 - OCT shows interval improvement in IRF/IRHM temporal macula at 4 wks - recommend IVE #5 today OS, 9.20.22 - pt wishes to proceed - RBA of procedure discussed, questions answered - informed consent obtained and signed - see procedure note - Avastin informed consent obtained and signed, 10.18.21 (OS) - Eylea informed consent obtained and signed, 05.31.22 - Eylea4U paperwork completed 05.31.22 - F/U 4 weeks, DFE, OCT, possible injection   3,4. Hypertensive retinopathy OU - discussed importance of tight BP control - monitor  5. Age related macular degeneration, non-exudative, both eyes  - The incidence, anatomy, and pathology of dry AMD, risk of progression, and the AREDS and AREDS 2 study including smoking risks discussed with patient.  - Recommend amsler grid monitoring  6. Pseudophakia OU  - s/p CE/IOL  - IOL in good position, doing well  - monitor  7. Glaucoma Suspect  - IOP 19,17  - under the expert care of Dr. Jerline Pain  Ophthalmic Meds Ordered this visit:  Meds ordered this encounter  Medications   aflibercept (EYLEA) SOLN 2 mg      Return in about 4 weeks (around 04/30/2021) for f/u BRVO OS, DFE, OCT.  There are no Patient Instructions on file for this visit.  This document serves as a record of services  personally performed by Gardiner Sleeper, MD, PhD. It was created on their behalf by Estill Bakes, [place] an ophthalmic technician. The creation of this record is the provider's dictation and/or activities during the visit.    Electronically signed by: Estill Bakes, COT 9.19.22 @ 4:58 PM   This document serves as a record of services personally performed by Gardiner Sleeper, MD, PhD. It was created on their behalf by San Jetty. Owens Shark, OA an ophthalmic technician. The creation of this record is the provider's dictation and/or activities during the visit.    Electronically signed by: San Jetty. Owens Shark, [place] 09.20.2022 4:58 PM   Gardiner Sleeper, M.D., Ph.D. Diseases & Surgery of the Retina and Vitreous [facility]  I have reviewed the above documentation for accuracy and completeness, and I agree with the above. Gardiner Sleeper, M.D., Ph.D. 04/03/21 4:58 PM   Abbreviations: M myopia (nearsighted); A astigmatism; H hyperopia (farsighted); P presbyopia; Mrx spectacle prescription;  CTL contact lenses; OD right eye; OS left eye; OU both eyes  XT exotropia; ET esotropia; PEK punctate epithelial keratitis; PEE punctate epithelial erosions; DES dry eye syndrome; MGD meibomian gland dysfunction; ATs artificial tears; PFAT's preservative free artificial tears; [place] nuclear sclerotic cataract; PSC posterior subcapsular cataract; ERM epi-retinal membrane; PVD posterior vitreous detachment; RD retinal detachment; DM diabetes mellitus; DR diabetic retinopathy; NPDR non-proliferative diabetic retinopathy; PDR proliferative diabetic retinopathy; CSME clinically significant macular edema; DME diabetic macular edema; dbh dot blot hemorrhages; CWS cotton wool spot; POAG primary open angle glaucoma; C/D cup-to-disc ratio; HVF humphrey visual field; GVF goldmann visual field; OCT optical coherence tomography; IOP intraocular pressure; BRVO Branch retinal vein occlusion; CRVO central retinal vein occlusion; CRAO central retinal artery occlusion; BRAO branch retinal artery occlusion; RT retinal tear; SB scleral buckle; PPV pars plana vitrectomy; VH Vitreous hemorrhage; PRP panretinal laser photocoagulation; IVK intravitreal kenalog; VMT vitreomacular traction; MH Macular hole;  NVD neovascularization of the disc; NVE neovascularization elsewhere; AREDS age related eye  disease study; ARMD age related macular degeneration; POAG primary open angle glaucoma; EBMD epithelial/anterior basement membrane dystrophy; ACIOL anterior chamber intraocular lens; IOL intraocular lens; PCIOL posterior chamber intraocular lens; Phaco/IOL phacoemulsification with intraocular lens placement; [place] photorefractive keratectomy; LASIK laser assisted in situ keratomileusis; HTN hypertension; DM diabetes mellitus; COPD chronic obstructive pulmonary disease

## 2021-04-02 ENCOUNTER — Encounter (INDEPENDENT_AMBULATORY_CARE_PROVIDER_SITE_OTHER): Payer: Self-pay | Admitting: Ophthalmology

## 2021-04-02 ENCOUNTER — Other Ambulatory Visit: Payer: Self-pay | Admitting: Internal Medicine

## 2021-04-02 ENCOUNTER — Ambulatory Visit (INDEPENDENT_AMBULATORY_CARE_PROVIDER_SITE_OTHER): Payer: Medicare Other | Admitting: Ophthalmology

## 2021-04-02 ENCOUNTER — Other Ambulatory Visit: Payer: Self-pay

## 2021-04-02 DIAGNOSIS — I10 Essential (primary) hypertension: Secondary | ICD-10-CM | POA: Diagnosis not present

## 2021-04-02 DIAGNOSIS — H35033 Hypertensive retinopathy, bilateral: Secondary | ICD-10-CM

## 2021-04-02 DIAGNOSIS — H353131 Nonexudative age-related macular degeneration, bilateral, early dry stage: Secondary | ICD-10-CM | POA: Diagnosis not present

## 2021-04-02 DIAGNOSIS — Z961 Presence of intraocular lens: Secondary | ICD-10-CM

## 2021-04-02 DIAGNOSIS — H34832 Tributary (branch) retinal vein occlusion, left eye, with macular edema: Secondary | ICD-10-CM

## 2021-04-02 DIAGNOSIS — H3581 Retinal edema: Secondary | ICD-10-CM

## 2021-04-02 DIAGNOSIS — H40003 Preglaucoma, unspecified, bilateral: Secondary | ICD-10-CM

## 2021-04-03 ENCOUNTER — Encounter (INDEPENDENT_AMBULATORY_CARE_PROVIDER_SITE_OTHER): Payer: Self-pay | Admitting: Ophthalmology

## 2021-04-03 MED ORDER — AFLIBERCEPT 2MG/0.05ML IZ SOLN FOR KALEIDOSCOPE
2.0000 mg | INTRAVITREAL | Status: AC | PRN
  Administered 2021-04-02: 2 mg via INTRAVITREAL

## 2021-04-03 NOTE — Telephone Encounter (Signed)
Patient was advised on 03/28/21 that it can take up to 7 business days to get results in the mail.  If she would like to come pick up a copy it will be available .

## 2021-04-03 NOTE — Telephone Encounter (Signed)
PT would like to get the status on the copy of both results to see if they have been mailed yet. Please advise.

## 2021-04-22 NOTE — Progress Notes (Signed)
[facility] Note  05/02/2021     CHIEF COMPLAINT Patient presents for Retina Follow Up   HISTORY OF PRESENT ILLNESS: Janice Small is a 87 y.o. female who presents to the clinic today for:  HPI     Retina Follow Up   Patient presents with  CRVO/BRVO.  In left eye.  This started 4 weeks ago.  I, the attending physician,  performed the HPI with the patient and updated documentation appropriately.        Comments   Patient here for 4 weeks retina follow up for BRVO OS. Patient states vision fairly well. No eye pain.       Last edited by Bernarda Caffey, MD on 05/02/2021 10:00 AM.    Pt states no changes in vision  Referring physician: Isaac Bliss, Rayford Halsted, [place] [address]  HISTORICAL INFORMATION:   Selected notes from the MEDICAL RECORD NUMBER Referred by Dr. Jerline Pain for eval of BRVO OS LEE: 10.12.2021 Ocular Hx- Glc suspect   CURRENT MEDICATIONS: No current outpatient medications on file. (Ophthalmic Drugs)   No current facility-administered medications for this visit. (Ophthalmic Drugs)   Current Outpatient Medications (Other)  Medication Sig   cloNIDine (CATAPRES) 0.1 MG tablet TAKE 1 TABLET BY MOUTH 2 TIMES DAILY.   furosemide (LASIX) 20 MG tablet Take 1 tablet (20 mg total) by mouth daily.   levothyroxine (SYNTHROID) 75 MCG tablet Take 1 tablet (75 mcg total) by mouth daily.   lisinopril (ZESTRIL) 40 MG tablet Take 1 tablet (40 mg total) by mouth daily.   metroNIDAZOLE (METROGEL) 0.75 % gel Apply 1 application topically daily as needed.   No current facility-administered medications for this visit. (Other)   REVIEW OF SYSTEMS: ROS   Positive for: Gastrointestinal, Musculoskeletal, Eyes Negative for: Constitutional, Neurological, Skin, Genitourinary, HENT, Endocrine, Cardiovascular, Respiratory, Psychiatric, Allergic/Imm, Heme/Lymph Last edited by Theodore Demark, COA on 05/02/2021  9:02  AM.    ALLERGIES Allergies  Allergen Reactions   Codeine Phosphate     REACTION: unspecified   PAST MEDICAL HISTORY Past Medical History:  Diagnosis Date   BREAST BIOPSY, HX OF 11/27/2006   COLONIC POLYPS, HX OF 11/27/2006   HEMORRHOIDS 01/17/2010   HYPERTENSION 11/27/2006   Hypertensive retinopathy    OU   HYPOTHYROIDISM 11/27/2006   Macular degeneration    Dry OU   MENOPAUSAL SYNDROME 11/27/2006   Past Surgical History:  Procedure Laterality Date   ABDOMINAL HYSTERECTOMY     APPENDECTOMY     BREAST EXCISIONAL BIOPSY Right 1987   BREAST SURGERY     bx   CATARACT EXTRACTION Bilateral    EYE SURGERY Bilateral    Cat Sx OU   TONSILLECTOMY     FAMILY HISTORY Family History  Problem Relation Age of Onset   Heart disease Father    SOCIAL HISTORY Social History   Tobacco Use   Smoking status: Never   Smokeless tobacco: Never  Substance Use Topics   Alcohol use: Yes    Comment: rarely   Drug use: No       OPHTHALMIC EXAM: Base Eye Exam     Visual Acuity (Snellen - Linear)       Right Left   Dist cc 20/20 -2 20/40   Dist ph cc  20/25 -1    Correction: Glasses         Tonometry (Tonopen, 9:00 AM)       Right Left  Pressure 15 14         Pupils       Dark Light Shape React APD   Right 3 2 Round Brisk None   Left 3 2 Round Brisk None         Visual Fields (Counting fingers)       Left Right    Full Full         Extraocular Movement       Right Left    Full, Ortho Full, Ortho         Neuro/Psych     Oriented x3: Yes   Mood/Affect: Normal         Dilation     Both eyes: 1.0% Mydriacyl, 2.5% Phenylephrine @ 9:00 AM           Slit Lamp and Fundus Exam     Slit Lamp Exam       Right Left   Lids/Lashes Dermatochalasis - upper lid Dermatochalasis - upper lid, mild Meibomian gland dysfunction   Conjunctiva/Sclera White and quiet White and quiet   Cornea 1-2+ fine Punctate epithelial erosions, well healed temporal  cataract wounds, arcus, early band K nasal and temporal 2-3+ inferior Punctate epithelial erosions, well healed temporal cataract wounds, arcus   Anterior Chamber Deep and quiet Deep and quiet   Iris Round and dilated Round and dilated   Lens 3 piece PC IOL in good position with open PC 3 piece PC IOL in good position, 1+PCO (non-central)   Vitreous Vitreous syneresis, Posterior vitreous detachment Vitreous syneresis, Posterior vitreous detachment         Fundus Exam       Right Left   Disc Pink and Sharp, Compact mild Pallor, Sharp rim, temporal PPA, Compact   C/D Ratio 0.3 0.2   Macula Flat, Blunted foveal reflex, mild Drusen, RPE mottling and clumping, no heme Blunted foveal reflex, scattered IRH/DBH -- improved, +laser changes, focal edema temporal macula -- persistent, slightly increased, Drusen, +focal exudates temporal mac   Vessels attenuated, Tortuous Tortuous, severe attenuation of vessels superior macula   Periphery Attached, No heme  Attached, mild reticular degeneration           Refraction     Wearing Rx       Sphere Cylinder Axis Add   Right -1.00 +0.50 027 +2.50   Left -0.75 +0.75 083 +2.50    Type: progressive           IMAGING AND PROCEDURES  Imaging and Procedures for 05/02/2021  OCT, Retina - OU - Both Eyes       Right Eye Quality was good. Central Foveal Thickness: 270. Progression has been stable. Findings include normal foveal contour, no IRF, no SRF, retinal drusen .   Left Eye Quality was good. Central Foveal Thickness: 274. Progression has worsened. Findings include normal foveal contour, intraretinal fluid, intraretinal hyper-reflective material, retinal drusen , no SRF (Interval increase in IRF/IRHM temporal macula).   Notes *Images captured and stored on drive  Diagnosis / Impression:  OD: non-exu ARMD w/ fine drusen OS: BRVO -- Interval increase in IRF/IRHM temporal macula  Clinical management:  See below  Abbreviations: NFP -  Normal foveal profile. CME - cystoid macular edema. PED - pigment epithelial detachment. IRF - intraretinal fluid. SRF - subretinal fluid. EZ - ellipsoid zone. ERM - epiretinal membrane. ORA - outer retinal atrophy. ORT - outer retinal tubulation. SRHM - subretinal hyper-reflective material. IRHM - intraretinal hyper-reflective material  Intravitreal Injection, Pharmacologic Agent - OS - Left Eye       Time Out 05/02/2021. 10:27 AM. Confirmed correct patient, procedure, site, and patient consented.   Anesthesia Topical anesthesia was used. Anesthetic medications included Lidocaine 2%, Proparacaine 0.5%.   Procedure Preparation included 5% betadine to ocular surface, eyelid speculum. A supplied needle was used.   Injection: 2 mg aflibercept 2 MG/0.05ML   Route: Intravitreal, Site: Left Eye   NDC: [id number], Lot: [id number], Expiration date: 03/12/2022, Waste: 0.05 mL   Post-op Post injection exam found visual acuity of at least counting fingers. The patient tolerated the procedure well. There were no complications. The patient received written and verbal post procedure care education.             ASSESSMENT/PLAN:   ICD-10-CM   1. Branch retinal vein occlusion of left eye with macular edema  H34.8320 Intravitreal Injection, Pharmacologic Agent - OS - Left Eye    aflibercept (EYLEA) SOLN 2 mg    2. Retinal edema  H35.81 OCT, Retina - OU - Both Eyes    3. Essential hypertension  I10     4. Hypertensive retinopathy of both eyes  H35.033     5. Early dry stage nonexudative age-related macular degeneration of both eyes  H35.3131     6. Pseudophakia, both eyes  Z96.1     7. Glaucoma suspect of both eyes  H40.003      1,2. BRVO w/ CME OS             - S/P IVA OS #1 (10.18.21), #2 (11.15.21), #3 (12.13.21), #4 (01.10.22), #5 (02.07.22), #6 (03.07.22), #7 (04.04.22), #8 (5.2.22) -- IVA resistance  - S/P IVE OS #1 (05.31.22) -- sample, #2 (06.28.22), #3 (07.26.22), #4  (08.23.22), #5 (09.20.22)             - S/P focal laser OS (02.16.22) - BCVA stable at 20/25 - OCT shows interval increase in IRF/IRHM temporal macula at 4 wks - recommend IVE #6 today OS, 10.20.22 - pt wishes to proceed - RBA of procedure discussed, questions answered - informed consent obtained and signed - see procedure note - Avastin informed consent obtained and signed, 10.18.21 (OS) - Eylea informed consent obtained and signed, 05.31.22 - Eylea4U paperwork completed 05.31.22 - F/U 4 weeks, DFE, OCT, possible injection   3,4. Hypertensive retinopathy OU - discussed importance of tight BP control - monitor  5. Age related macular degeneration, non-exudative, both eyes  - The incidence, anatomy, and pathology of dry AMD, risk of progression, and the AREDS and AREDS 2 study including smoking risks discussed with patient.  - Recommend amsler grid monitoring  6. Pseudophakia OU  - s/p CE/IOL  - IOL in good position, doing well  - monitor  7. Glaucoma Suspect  - IOP 19,17  - under the expert care of Dr. Jerline Pain  Ophthalmic Meds Ordered this visit:  Meds ordered this encounter  Medications   aflibercept (EYLEA) SOLN 2 mg     Return for 4 weeks for , DFE, OCT, possible injection.  There are no Patient Instructions on file for this visit.  This document serves as a record of services personally performed by Gardiner Sleeper, MD, PhD. It was created on their behalf by San Jetty. Owens Shark, OA an ophthalmic technician. The creation of this record is the provider's dictation and/or activities during the visit.    Electronically signed by: San Jetty. [place], OA 10.10.2022 11:48 PM  This document serves as a  record of services personally performed by Gardiner Sleeper, MD, PhD. It was created on their behalf by Leonie Douglas, an ophthalmic technician. The creation of this record is the provider's dictation and/or activities during the visit.    Electronically signed by: Leonie Douglas COA,  05/02/21  11:48 PM  Gardiner Sleeper, M.D., Ph.D. Diseases & Surgery of the Retina and [place] 05/02/2021  I have reviewed the above documentation for accuracy and completeness, and I agree with the above. Gardiner Sleeper, M.D., Ph.D. 05/02/21 11:49 PM  Abbreviations: M myopia (nearsighted); A astigmatism; H hyperopia (farsighted); P presbyopia; Mrx spectacle prescription;  CTL contact lenses; OD right eye; OS left eye; OU both eyes  XT exotropia; ET esotropia; PEK punctate epithelial keratitis; PEE punctate epithelial erosions; DES dry eye syndrome; MGD meibomian gland dysfunction; ATs artificial tears; PFAT's preservative free artificial tears; [place] nuclear sclerotic cataract; PSC posterior subcapsular cataract; ERM epi-retinal membrane; PVD posterior vitreous detachment; RD retinal detachment; DM diabetes mellitus; DR diabetic retinopathy; NPDR non-proliferative diabetic retinopathy; PDR proliferative diabetic retinopathy; CSME clinically significant macular edema; DME diabetic macular edema; dbh dot blot hemorrhages; CWS cotton wool spot; POAG primary open angle glaucoma; C/D cup-to-disc ratio; HVF humphrey visual field; GVF goldmann visual field; OCT optical coherence tomography; IOP intraocular pressure; BRVO Branch retinal vein occlusion; CRVO central retinal vein occlusion; CRAO central retinal artery occlusion; BRAO branch retinal artery occlusion; RT retinal tear; SB scleral buckle; PPV pars plana vitrectomy; VH Vitreous hemorrhage; PRP panretinal laser photocoagulation; IVK intravitreal kenalog; VMT vitreomacular traction; MH Macular hole;  NVD neovascularization of the disc; NVE neovascularization elsewhere; AREDS age related eye disease study; ARMD age related macular degeneration; POAG primary open angle glaucoma; EBMD epithelial/anterior basement membrane dystrophy; ACIOL anterior chamber intraocular lens; IOL intraocular lens; PCIOL posterior chamber  intraocular lens; Phaco/IOL phacoemulsification with intraocular lens placement; [place] photorefractive keratectomy; LASIK laser assisted in situ keratomileusis; HTN hypertension; DM diabetes mellitus; COPD chronic obstructive pulmonary disease

## 2021-05-02 ENCOUNTER — Ambulatory Visit (INDEPENDENT_AMBULATORY_CARE_PROVIDER_SITE_OTHER): Payer: Medicare Other | Admitting: Ophthalmology

## 2021-05-02 ENCOUNTER — Encounter (INDEPENDENT_AMBULATORY_CARE_PROVIDER_SITE_OTHER): Payer: Self-pay | Admitting: Ophthalmology

## 2021-05-02 ENCOUNTER — Other Ambulatory Visit: Payer: Self-pay

## 2021-05-02 DIAGNOSIS — H34832 Tributary (branch) retinal vein occlusion, left eye, with macular edema: Secondary | ICD-10-CM | POA: Diagnosis not present

## 2021-05-02 DIAGNOSIS — H35033 Hypertensive retinopathy, bilateral: Secondary | ICD-10-CM

## 2021-05-02 DIAGNOSIS — Z961 Presence of intraocular lens: Secondary | ICD-10-CM

## 2021-05-02 DIAGNOSIS — I10 Essential (primary) hypertension: Secondary | ICD-10-CM | POA: Diagnosis not present

## 2021-05-02 DIAGNOSIS — H353131 Nonexudative age-related macular degeneration, bilateral, early dry stage: Secondary | ICD-10-CM

## 2021-05-02 DIAGNOSIS — H40003 Preglaucoma, unspecified, bilateral: Secondary | ICD-10-CM

## 2021-05-02 DIAGNOSIS — H3581 Retinal edema: Secondary | ICD-10-CM

## 2021-05-02 MED ORDER — AFLIBERCEPT 2MG/0.05ML IZ SOLN FOR KALEIDOSCOPE
2.0000 mg | INTRAVITREAL | Status: AC | PRN
  Administered 2021-05-02: 2 mg via INTRAVITREAL

## 2021-05-28 NOTE — Progress Notes (Signed)
[facility] Note  05/30/2021     CHIEF COMPLAINT Patient presents for Retina Follow Up   HISTORY OF PRESENT ILLNESS: Janice Small is a 87 y.o. female who presents to the clinic today for:  HPI     Retina Follow Up   Patient presents with  CRVO/BRVO.  In left eye.  Duration of 4 weeks.  Since onset it is stable.  I, the attending physician,  performed the HPI with the patient and updated documentation appropriately.        Comments   4 week follow up BRVO OS-  Denies any changes in vision OU.        Last edited by Bernarda Caffey, MD on 05/30/2021 10:02 AM.     Pt states vision stable.  Referring physician: Isaac Bliss, Rayford Halsted, MD [place],  [place] [id number]  HISTORICAL INFORMATION:   Selected notes from the MEDICAL RECORD NUMBER Referred by Dr. Jerline Pain for eval of BRVO OS LEE: 10.12.2021 Ocular Hx- Glc suspect   CURRENT MEDICATIONS: No current outpatient medications on file. (Ophthalmic Drugs)   No current facility-administered medications for this visit. (Ophthalmic Drugs)   Current Outpatient Medications (Other)  Medication Sig   cloNIDine (CATAPRES) 0.1 MG tablet TAKE 1 TABLET BY MOUTH 2 TIMES DAILY.   furosemide (LASIX) 20 MG tablet Take 1 tablet (20 mg total) by mouth daily.   levothyroxine (SYNTHROID) 75 MCG tablet Take 1 tablet (75 mcg total) by mouth daily.   lisinopril (ZESTRIL) 40 MG tablet Take 1 tablet (40 mg total) by mouth daily.   metroNIDAZOLE (METROGEL) 0.75 % gel Apply 1 application topically daily as needed.   No current facility-administered medications for this visit. (Other)   REVIEW OF SYSTEMS: ROS   Positive for: Gastrointestinal, Musculoskeletal, Eyes Negative for: Constitutional, Neurological, Skin, Genitourinary, HENT, Endocrine, Cardiovascular, Respiratory, Psychiatric, Allergic/Imm, Heme/Lymph Last edited by Leonie Douglas, COA on 05/30/2021  8:48 AM.      ALLERGIES Allergies  Allergen Reactions   Codeine Phosphate     REACTION: unspecified   PAST MEDICAL HISTORY Past Medical History:  Diagnosis Date   BREAST BIOPSY, HX OF 11/27/2006   COLONIC POLYPS, HX OF 11/27/2006   HEMORRHOIDS 01/17/2010   HYPERTENSION 11/27/2006   Hypertensive retinopathy    OU   HYPOTHYROIDISM 11/27/2006   Macular degeneration    Dry OU   MENOPAUSAL SYNDROME 11/27/2006   Past Surgical History:  Procedure Laterality Date   ABDOMINAL HYSTERECTOMY     APPENDECTOMY     BREAST EXCISIONAL BIOPSY Right 1987   BREAST SURGERY     bx   CATARACT EXTRACTION Bilateral    EYE SURGERY Bilateral    Cat Sx OU   TONSILLECTOMY     FAMILY HISTORY Family History  Problem Relation Age of Onset   Heart disease Father    SOCIAL HISTORY Social History   Tobacco Use   Smoking status: Never   Smokeless tobacco: Never  Substance Use Topics   Alcohol use: Yes    Comment: rarely   Drug use: No       OPHTHALMIC EXAM: Base Eye Exam     Visual Acuity (Snellen - Linear)       Right Left   Dist cc 20/20- 20/40-   Dist ph cc  20/30    Correction: Glasses  OS- pt had difficulties with PH        Tonometry (Tonopen, 8:59 AM)  Right Left   Pressure 19 16         Pupils       Dark Light Shape React APD   Right 3 2 Round Minimal None   Left 3 2 Round Minimal None         Visual Fields (Counting fingers)       Left Right    Full Full         Extraocular Movement       Right Left    Full Full         Neuro/Psych     Oriented x3: Yes   Mood/Affect: Normal         Dilation     Both eyes: 1.0% Mydriacyl, 2.5% Phenylephrine @ 8:59 AM           Slit Lamp and Fundus Exam     Slit Lamp Exam       Right Left   Lids/Lashes Dermatochalasis - upper lid Dermatochalasis - upper lid, mild Meibomian gland dysfunction   Conjunctiva/Sclera White and quiet White and quiet   Cornea 1-2+ fine Punctate epithelial erosions, well healed  temporal cataract wounds, arcus, early band K nasal and temporal 2-3+ inferior Punctate epithelial erosions, well healed temporal cataract wounds, arcus   Anterior Chamber Deep and quiet Deep and quiet   Iris Round and dilated Round and dilated   Lens 3 piece PC IOL in good position with open PC 3 piece PC IOL in good position, 1+PCO (non-central)   Anterior Vitreous Vitreous syneresis, Posterior vitreous detachment Vitreous syneresis, Posterior vitreous detachment         Fundus Exam       Right Left   Disc Pink and Sharp, Compact mild Pallor, Sharp rim, temporal PPA, Compact   C/D Ratio 0.3 0.2   Macula Flat, Blunted foveal reflex, mild Drusen, RPE mottling and clumping, no heme or edema Blunted foveal reflex, scattered IRH/DBH -- persistent, +laser changes, focal edema temporal macula -- persistent, Drusen, +focal exudates temporal mac - improved   Vessels attenuated, Tortuous Tortuous, severe attenuation of vessels superior macula   Periphery Attached, No heme  Attached, mild reticular degeneration           Refraction     Wearing Rx       Sphere Cylinder Axis Add   Right -1.00 +0.50 027 +2.50   Left -0.75 +0.75 083 +2.50    Type: progressive         Manifest Refraction       Sphere Cylinder Axis Dist VA   Right       Left -0.50 +1.00 150 20/30           IMAGING AND PROCEDURES  Imaging and Procedures for 05/30/2021  OCT, Retina - OU - Both Eyes       Right Eye Quality was good. Central Foveal Thickness: 270. Progression has been stable. Findings include normal foveal contour, no IRF, no SRF, retinal drusen .   Left Eye Quality was good. Central Foveal Thickness: 268. Progression has been stable. Findings include normal foveal contour, intraretinal fluid, intraretinal hyper-reflective material, retinal drusen , no SRF (Persistent IRF/IRHM temporal macula -- slightly improved).   Notes *Images captured and stored on drive  Diagnosis / Impression:  OD:  non-exu ARMD w/ fine drusen OS: BRVO -- Persistent IRF/IRHM temporal macula -- slightly improved  Clinical management:  See below  Abbreviations: NFP - Normal foveal profile. CME - cystoid macular edema. PED -  pigment epithelial detachment. IRF - intraretinal fluid. SRF - subretinal fluid. EZ - ellipsoid zone. ERM - epiretinal membrane. ORA - outer retinal atrophy. ORT - outer retinal tubulation. SRHM - subretinal hyper-reflective material. IRHM - intraretinal hyper-reflective material      Intravitreal Injection, Pharmacologic Agent - OS - Left Eye       Time Out 05/30/2021. 9:34 AM. Confirmed correct patient, procedure, site, and patient consented.   Anesthesia Topical anesthesia was used. Anesthetic medications included Lidocaine 2%, Proparacaine 0.5%.   Procedure Preparation included 5% betadine to ocular surface, eyelid speculum. A (32g) needle was used.   Injection: 2 mg aflibercept 2 MG/0.05ML   Route: Intravitreal, Site: Left Eye   NDC: [id number], Lot: [id number], Expiration date: 03/14/2022, Waste: 0.05 mL   Post-op Post injection exam found visual acuity of at least counting fingers. The patient tolerated the procedure well. There were no complications. The patient received written and verbal post procedure care education.              ASSESSMENT/PLAN:   ICD-10-CM   1. Branch retinal vein occlusion of left eye with macular edema  H34.8320 Intravitreal Injection, Pharmacologic Agent - OS - Left Eye    aflibercept (EYLEA) SOLN 2 mg    2. Retinal edema  H35.81 OCT, Retina - OU - Both Eyes    3. Essential hypertension  I10     4. Hypertensive retinopathy of both eyes  H35.033     5. Early dry stage nonexudative age-related macular degeneration of both eyes  H35.3131     6. Pseudophakia, both eyes  Z96.1     7. Glaucoma suspect of both eyes  H40.003      1,2. BRVO w/ CME OS             - S/P IVA OS #1 (10.18.21), #2 (11.15.21), #3 (12.13.21), #4  (01.10.22), #5 (02.07.22), #6 (03.07.22), #7 (04.04.22), #8 (05.02.22) -- IVA resistance  - S/P IVE OS #1 (05.31.22) -- sample, #2 (06.28.22), #3 (07.26.22), #4 (08.23.22), #5 (09.20.22), #6 (10.20.22)             - S/P focal laser OS (02.16.22) - BCVA decreased to 20/30 from 20/25 - OCT shows persistent IRF/IRHM temporal macula at 4 wks - recommend IVE OS #7 today, 11.17.22 - pt wishes to proceed - RBA of procedure discussed, questions answered - informed consent obtained and signed - see procedure note - Avastin informed consent obtained and signed, 10.18.21 (OS) - Eylea informed consent obtained and signed, 05.31.22 - Eylea4U paperwork completed 05.31.22 - F/U 4 weeks, DFE, OCT, possible injection   3,4. Hypertensive retinopathy OU - discussed importance of tight BP control - monitor  5. Age related macular degeneration, non-exudative, both eyes  - The incidence, anatomy, and pathology of dry AMD, risk of progression, and the AREDS and AREDS 2 study including smoking risks discussed with patient.  - Recommend amsler grid monitoring  6. Pseudophakia OU  - s/p CE/IOL  - IOL in good position, doing well  - monitor  7. Glaucoma Suspect  - IOP 19,17  - under the expert care of Dr. Jerline Pain  Ophthalmic Meds Ordered this visit:  Meds ordered this encounter  Medications   aflibercept (EYLEA) SOLN 2 mg     Return in about 4 weeks (around 06/27/2021) for f/u BRVO OS, DFE, OCT.  There are no Patient Instructions on file for this visit.  This document serves as a record of services personally performed by Sharyon Cable.  Coralyn Pear, MD, PhD. It was created on their behalf by San Jetty. Owens Shark, OA an ophthalmic technician. The creation of this record is the provider's dictation and/or activities during the visit.    Electronically signed by: San Jetty. Owens Shark, [place] 11.15.2022 10:04 AM   Gardiner Sleeper, M.D., Ph.D. Diseases & Surgery of the Retina and Vitreous [facility]  I have reviewed the above documentation for accuracy and completeness, and I agree with the above. Gardiner Sleeper, M.D., Ph.D. 05/30/21 10:04 AM   Abbreviations: M myopia (nearsighted); A astigmatism; H hyperopia (farsighted); P presbyopia; Mrx spectacle prescription;  CTL contact lenses; OD right eye; OS left eye; OU both eyes  XT exotropia; ET esotropia; PEK punctate epithelial keratitis; PEE punctate epithelial erosions; DES dry eye syndrome; MGD meibomian gland dysfunction; ATs artificial tears; PFAT's preservative free artificial tears; [place] nuclear sclerotic cataract; PSC posterior subcapsular cataract; ERM epi-retinal membrane; PVD posterior vitreous detachment; RD retinal detachment; DM diabetes mellitus; DR diabetic retinopathy; NPDR non-proliferative diabetic retinopathy; PDR proliferative diabetic retinopathy; CSME clinically significant macular edema; DME diabetic macular edema; dbh dot blot hemorrhages; CWS cotton wool spot; POAG primary open angle glaucoma; C/D cup-to-disc ratio; HVF humphrey visual field; GVF goldmann visual field; OCT optical coherence tomography; IOP intraocular pressure; BRVO Branch retinal vein occlusion; CRVO central retinal vein occlusion; CRAO central retinal artery occlusion; BRAO branch retinal artery occlusion; RT retinal tear; SB scleral buckle; PPV pars plana vitrectomy; VH Vitreous hemorrhage; PRP panretinal laser photocoagulation; IVK intravitreal kenalog; VMT vitreomacular traction; MH Macular hole;  NVD neovascularization of the disc; NVE neovascularization elsewhere; AREDS age related eye disease study; ARMD age related macular degeneration; POAG primary open angle glaucoma; EBMD epithelial/anterior basement membrane dystrophy; ACIOL anterior chamber intraocular lens; IOL intraocular lens; PCIOL posterior chamber intraocular lens; Phaco/IOL phacoemulsification with intraocular lens placement; [place] photorefractive keratectomy; LASIK laser assisted in situ  keratomileusis; HTN hypertension; DM diabetes mellitus; COPD chronic obstructive pulmonary disease

## 2021-05-29 ENCOUNTER — Telehealth: Payer: Self-pay | Admitting: Internal Medicine

## 2021-05-29 NOTE — Telephone Encounter (Signed)
Spoke with patient.

## 2021-05-29 NOTE — Telephone Encounter (Signed)
Patient called because she received a call from [facility] that her  had been sent in, but patient states she has not been to the office and is confused on why it was sent. Patient would like a callback to discuss.     Good callback number is [phone]       Please advise

## 2021-05-30 ENCOUNTER — Other Ambulatory Visit: Payer: Self-pay

## 2021-05-30 ENCOUNTER — Ambulatory Visit (INDEPENDENT_AMBULATORY_CARE_PROVIDER_SITE_OTHER): Payer: Medicare Other | Admitting: Ophthalmology

## 2021-05-30 ENCOUNTER — Encounter (INDEPENDENT_AMBULATORY_CARE_PROVIDER_SITE_OTHER): Payer: Self-pay | Admitting: Ophthalmology

## 2021-05-30 DIAGNOSIS — H34832 Tributary (branch) retinal vein occlusion, left eye, with macular edema: Secondary | ICD-10-CM

## 2021-05-30 DIAGNOSIS — H35033 Hypertensive retinopathy, bilateral: Secondary | ICD-10-CM

## 2021-05-30 DIAGNOSIS — H3581 Retinal edema: Secondary | ICD-10-CM

## 2021-05-30 DIAGNOSIS — H353131 Nonexudative age-related macular degeneration, bilateral, early dry stage: Secondary | ICD-10-CM

## 2021-05-30 DIAGNOSIS — H40003 Preglaucoma, unspecified, bilateral: Secondary | ICD-10-CM

## 2021-05-30 DIAGNOSIS — Z961 Presence of intraocular lens: Secondary | ICD-10-CM

## 2021-05-30 DIAGNOSIS — I10 Essential (primary) hypertension: Secondary | ICD-10-CM

## 2021-05-30 MED ORDER — AFLIBERCEPT 2MG/0.05ML IZ SOLN FOR KALEIDOSCOPE
2.0000 mg | INTRAVITREAL | Status: AC | PRN
  Administered 2021-05-30: 10:00:00 2 mg via INTRAVITREAL

## 2021-06-19 ENCOUNTER — Encounter: Payer: Self-pay | Admitting: Family Medicine

## 2021-06-19 ENCOUNTER — Ambulatory Visit (INDEPENDENT_AMBULATORY_CARE_PROVIDER_SITE_OTHER): Payer: Medicare Other | Admitting: Family Medicine

## 2021-06-19 VITALS — BP 212/105 | HR 85 | Temp 98.1°F | Ht 60.0 in | Wt 151.1 lb

## 2021-06-19 DIAGNOSIS — I10 Essential (primary) hypertension: Secondary | ICD-10-CM | POA: Diagnosis not present

## 2021-06-19 DIAGNOSIS — H9120 Sudden idiopathic hearing loss, unspecified ear: Secondary | ICD-10-CM | POA: Diagnosis not present

## 2021-06-19 MED ORDER — PREDNISONE 20 MG PO TABS: ORAL_TABLET | ORAL | 0 refills | Status: DC

## 2021-06-19 MED ORDER — AMLODIPINE BESYLATE 2.5 MG PO TABS: 2.5000 mg | ORAL_TABLET | Freq: Every day | ORAL | 2 refills | Status: DC

## 2021-06-19 NOTE — Progress Notes (Signed)
Established Patient Office Visit  Subjective:  Patient ID: Janice Small, female    DOB: 1928-04-15  Age: 87 y.o. MRN: [id number]  CC:  Chief Complaint  Patient presents with   Ear Fullness    Patient complains of ear fullness, x4 days,     HPI Janice Small presents for about 4-day history of sensation of "right ear fullness ".  On further questioning she really does not describe any fullness as much as decreased hearing which came on acutely last Friday right ear.  She states that she was on the phone listening with her right ear and could not hear anything and thought initially that her phone was malfunctioning.  She then realized that she could hear very well with the left ear.  She has not had any nasal congestion.  No tinnitus.  No vertigo.  No ear drainage.  No ear pain.  No history of hearing difficulties previously.  No rashes.  No facial weakness.  No headaches.  Patient has history of hypertension.  She is currently on clonidine, furosemide, lisinopril.  She has had very high readings several times here including initially here today 180/108.  Repeat left arm seated after rest 212/105  She states she is compliant with medications.  She had been on amlodipine and states her blood pressure was much better controlled but apparently she had some edema at the 10 mg dose.  Is not clear if she took lower doses of amlodipine.  She tries to watch her salt intake closely.  No alcohol use.  Past Medical History:  Diagnosis Date   BREAST BIOPSY, HX OF 11/27/2006   COLONIC POLYPS, HX OF 11/27/2006   HEMORRHOIDS 01/17/2010   HYPERTENSION 11/27/2006   Hypertensive retinopathy    OU   HYPOTHYROIDISM 11/27/2006   Macular degeneration    Dry OU   MENOPAUSAL SYNDROME 11/27/2006    Past Surgical History:  Procedure Laterality Date   ABDOMINAL HYSTERECTOMY     APPENDECTOMY     BREAST EXCISIONAL BIOPSY Right 1987   BREAST SURGERY     bx   CATARACT EXTRACTION Bilateral    EYE SURGERY  Bilateral    Cat Sx OU   TONSILLECTOMY      Family History  Problem Relation Age of Onset   Heart disease Father     Social History   Socioeconomic History   Marital status: Widowed    Spouse name: Not on file   Number of children: Not on file   Years of education: Not on file   Highest education level: Not on file  Occupational History   Not on file  Tobacco Use   Smoking status: Never   Smokeless tobacco: Never  Substance and Sexual Activity   Alcohol use: Yes    Comment: rarely   Drug use: No   Sexual activity: Not on file  Other Topics Concern   Not on file  Social History Narrative   Not on file   Social Determinants of Health   Financial Resource Strain: Not on file  Food Insecurity: Not on file  Transportation Needs: Not on file  Physical Activity: Not on file  Stress: Not on file  Social Connections: Not on file  Intimate Partner Violence: Not on file    Outpatient Medications Prior to Visit  Medication Sig Dispense Refill   cloNIDine (CATAPRES) 0.1 MG tablet TAKE 1 TABLET BY MOUTH 2 TIMES DAILY. 180 tablet 1   furosemide (LASIX) 20 MG tablet Take 1 tablet (  20 mg total) by mouth daily. 90 tablet 1   levothyroxine (SYNTHROID) 75 MCG tablet Take 1 tablet (75 mcg total) by mouth daily. 90 tablet 1   lisinopril (ZESTRIL) 40 MG tablet Take 1 tablet (40 mg total) by mouth daily. 90 tablet 1   metroNIDAZOLE (METROGEL) 0.75 % gel Apply 1 application topically daily as needed. 45 g 1   No facility-administered medications prior to visit.    Allergies  Allergen Reactions   Codeine Phosphate     REACTION: unspecified    ROS Review of Systems  Constitutional:  Negative for chills, fatigue and fever.  HENT:  Positive for hearing loss. Negative for congestion, ear discharge and ear pain.   Eyes:  Negative for visual disturbance.  Respiratory:  Negative for cough, chest tightness, shortness of breath and wheezing.   Cardiovascular:  Negative for chest pain,  palpitations and leg swelling.  Neurological:  Negative for dizziness, seizures, syncope, weakness, light-headedness and headaches.     Objective:    Physical Exam Constitutional:      Appearance: She is well-developed.  HENT:     Head:     Comments: No significant cerumen in either canal.  Eardrums no acute changes.  No obvious effusion.  No erythema.  No bulging. Eyes:     Pupils: Pupils are equal, round, and reactive to light.  Neck:     Thyroid: No thyromegaly.     Vascular: No JVD.  Cardiovascular:     Rate and Rhythm: Normal rate and regular rhythm.     Heart sounds:    No gallop.  Pulmonary:     Effort: Pulmonary effort is normal. No respiratory distress.     Breath sounds: Normal breath sounds. No wheezing or rales.  Musculoskeletal:     Cervical back: Neck supple.     Right lower leg: No edema.     Left lower leg: No edema.  Neurological:     General: No focal deficit present.     Mental Status: She is alert.     Cranial Nerves: No cranial nerve deficit.     Comments: He has significantly impaired hearing on the right side with tuning fork compared to the left.    BP (!) 212/105 (BP Location: Left Arm, Cuff Size: Normal)   Pulse 85   Temp 98.1 F (36.7 C) (Oral)   Ht 5' (1.524 m)   Wt 151 lb 1.6 oz (68.5 kg)   SpO2 97%   BMI 29.51 kg/m  Wt Readings from Last 3 Encounters:  06/19/21 151 lb 1.6 oz (68.5 kg)  03/14/21 148 lb 8 oz (67.4 kg)  12/12/20 149 lb 6.4 oz (67.8 kg)     Health Maintenance Due  Topic Date Due   Zoster Vaccines- Shingrix (1 of 2) Never done   COVID-19 Vaccine (5 - Booster for Pfizer series) 03/10/2021   TETANUS/TDAP  06/15/2021    There are no preventive care reminders to display for this patient.  Lab Results  Component Value Date   TSH 2.56 07/10/2020   Lab Results  Component Value Date   WBC 4.9 12/03/2018   HGB 14.2 12/03/2018   HCT 41.6 12/03/2018   MCV 97.1 12/03/2018   PLT 141.0 (L) 12/03/2018   Lab Results   Component Value Date   NA 141 03/29/2020   K 3.5 03/29/2020   CO2 31 03/29/2020   GLUCOSE 107 (H) 03/29/2020   BUN 29 (H) 03/29/2020   CREATININE 0.93 (H) 03/29/2020  BILITOT 0.6 03/29/2020   ALKPHOS 68 12/03/2018   AST 7 (L) 03/29/2020   ALT 11 03/29/2020   PROT 6.4 03/29/2020   ALBUMIN 4.7 12/03/2018   CALCIUM 10.8 (H) 03/29/2020   GFR 60.37 11/09/2019   Lab Results  Component Value Date   CHOL 202 (H) 12/03/2018   Lab Results  Component Value Date   HDL 97.60 12/03/2018   Lab Results  Component Value Date   LDLCALC 86 12/03/2018   Lab Results  Component Value Date   TRIG 93.0 12/03/2018   Lab Results  Component Value Date   CHOLHDL 2 12/03/2018   No results found for: HGBA1C    Assessment & Plan:   #1 sudden right-sided hearing loss.  Nonfocal exam.  Concern is sudden sensorineural hearing loss on the right.  We discussed significance of this and the fact that this may not come back even with treatment.  She does not have any history of diabetes or prior intolerance with prednisone.  We recommend starting prednisone 60 mg daily for 10 days and set up ENT referral.  Discussed potential side effects from prednisone including hyperglycemia.  We are setting up ENT referral to try to get her into soon as possible  #2 severe hypertension.  Question of whitecoat syndrome previously.  She has recently had concerns that even her home readings are up.  She previously had good results with blood pressure control with amlodipine but apparently had edema at the 10 mg dose.  We recommend starting 2.5 mg amlodipine and continue her lisinopril, furosemide, and clonidine.  She has follow-up with primary in a few weeks to reassess.  Continue to watch sodium intake closely.    Follow-up: No follow-ups on file.    Carolann Littler, MD

## 2021-06-19 NOTE — Patient Instructions (Signed)
Start back the Amlodipine 2.5 mg once daily  Start the Prednisone promptly  I will put in ENT referral.    Set up follow up with Dr Lemmie Evens in a few weeks.

## 2021-06-24 ENCOUNTER — Telehealth: Payer: Self-pay

## 2021-06-24 NOTE — Telephone Encounter (Signed)
Patient called back into the office and wanted to know if they could do a TOC to Dr. Elease Hashimoto. Are you guys okay with this TOC?

## 2021-06-24 NOTE — Telephone Encounter (Signed)
Patient called into the office asking to speak with practice administrator about changing providers. Called patient back and left a VM for patient to call back.

## 2021-06-25 NOTE — Progress Notes (Signed)
[facility] Note  06/27/2021     CHIEF COMPLAINT Patient presents for Retina Follow Up   HISTORY OF PRESENT ILLNESS: Janice Small is a 87 y.o. female who presents to the clinic today for:  HPI     Retina Follow Up   Patient presents with  CRVO/BRVO.  In left eye.  Severity is moderate.  Duration of 4 weeks.  Since onset it is stable.  I, the attending physician,  performed the HPI with the patient and updated documentation appropriately.        Comments   Pt here for 4 wk ret f/u for BRVO OS. Pt states no vision changes or issues.       Last edited by Bernarda Caffey, MD on 06/27/2021  9:03 AM.    Pt states vision stable, she is on po Prednisone for hearing loss   Referring physician: Isaac Bliss, Rayford Halsted, MD [place],  [place] [address]  HISTORICAL INFORMATION:   Selected notes from the MEDICAL RECORD NUMBER Referred by Dr. Jerline Pain for eval of BRVO OS LEE: 10.12.2021 Ocular Hx- Glc suspect   CURRENT MEDICATIONS: No current outpatient medications on file. (Ophthalmic Drugs)   No current facility-administered medications for this visit. (Ophthalmic Drugs)   Current Outpatient Medications (Other)  Medication Sig   amLODipine (NORVASC) 2.5 MG tablet Take 1 tablet (2.5 mg total) by mouth daily.   cloNIDine (CATAPRES) 0.1 MG tablet TAKE 1 TABLET BY MOUTH 2 TIMES DAILY.   furosemide (LASIX) 20 MG tablet Take 1 tablet (20 mg total) by mouth daily.   levothyroxine (SYNTHROID) 75 MCG tablet Take 1 tablet (75 mcg total) by mouth daily.   lisinopril (ZESTRIL) 40 MG tablet Take 1 tablet (40 mg total) by mouth daily.   metroNIDAZOLE (METROGEL) 0.75 % gel Apply 1 application topically daily as needed.   predniSONE (DELTASONE) 20 MG tablet Take three tablets by mouth once daily for 10 days (for sudden sensorineural hearing loss)   No current facility-administered medications for this visit. (Other)   REVIEW OF  SYSTEMS: ROS   Positive for: Gastrointestinal, Musculoskeletal, Eyes Negative for: Constitutional, Neurological, Skin, Genitourinary, HENT, Endocrine, Cardiovascular, Respiratory, Psychiatric, Allergic/Imm, Heme/Lymph Last edited by Kingsley Spittle, COT on 06/27/2021  8:35 AM.      ALLERGIES Allergies  Allergen Reactions   Codeine Phosphate     REACTION: unspecified   PAST MEDICAL HISTORY Past Medical History:  Diagnosis Date   BREAST BIOPSY, HX OF 11/27/2006   COLONIC POLYPS, HX OF 11/27/2006   HEMORRHOIDS 01/17/2010   HYPERTENSION 11/27/2006   Hypertensive retinopathy    OU   HYPOTHYROIDISM 11/27/2006   Macular degeneration    Dry OU   MENOPAUSAL SYNDROME 11/27/2006   Past Surgical History:  Procedure Laterality Date   ABDOMINAL HYSTERECTOMY     APPENDECTOMY     BREAST EXCISIONAL BIOPSY Right 1987   BREAST SURGERY     bx   CATARACT EXTRACTION Bilateral    EYE SURGERY Bilateral    Cat Sx OU   TONSILLECTOMY     FAMILY HISTORY Family History  Problem Relation Age of Onset   Heart disease Father    SOCIAL HISTORY Social History   Tobacco Use   Smoking status: Never   Smokeless tobacco: Never  Substance Use Topics   Alcohol use: Yes    Comment: rarely   Drug use: No       OPHTHALMIC EXAM: Base Eye Exam  Visual Acuity (Snellen - Linear)       Right Left   Dist cc 20/20 -2 20/50 -2   Dist ph cc  20/30 -1    Correction: Glasses         Tonometry (Tonopen, 8:42 AM)       Right Left   Pressure 17 16         Pupils       Dark Light Shape React APD   Right 3 2 Round Minimal None   Left 3 2 Round Minimal None         Visual Fields (Counting fingers)       Left Right    Full Full         Extraocular Movement       Right Left    Full, Ortho Full, Ortho         Neuro/Psych     Oriented x3: Yes   Mood/Affect: Normal         Dilation     Both eyes: 1.0% Mydriacyl, 2.5% Phenylephrine @ 8:43 AM           Slit Lamp  and Fundus Exam     Slit Lamp Exam       Right Left   Lids/Lashes Dermatochalasis - upper lid Dermatochalasis - upper lid, mild Meibomian gland dysfunction   Conjunctiva/Sclera White and quiet White and quiet   Cornea 1-2+ fine Punctate epithelial erosions, well healed temporal cataract wounds, arcus, early band K nasal and temporal 2-3+ inferior Punctate epithelial erosions, well healed temporal cataract wounds, arcus   Anterior Chamber Deep and quiet Deep and quiet   Iris Round and dilated Round and dilated   Lens 3 piece PC IOL in good position with open PC 3 piece PC IOL in good position, 1+PCO (non-central)   Anterior Vitreous Vitreous syneresis, Posterior vitreous detachment Vitreous syneresis, Posterior vitreous detachment         Fundus Exam       Right Left   Disc Pink and Sharp, Compact mild Pallor, Sharp rim, temporal PPA, Compact   C/D Ratio 0.3 0.2   Macula Flat, Blunted foveal reflex, mild Drusen, RPE mottling and clumping, no heme or edema Blunted foveal reflex, focal IRH temporal macula with cystic changes and exudates improved, +laser changes, Drusen   Vessels attenuated, Tortuous Tortuous, severe attenuation of vessels superior macula   Periphery Attached, No heme  Attached, mild reticular degeneration           Refraction     Wearing Rx       Sphere Cylinder Axis Add   Right -1.00 +0.50 027 +2.50   Left -0.75 +0.75 083 +2.50    Type: progressive           IMAGING AND PROCEDURES  Imaging and Procedures for 06/27/2021  OCT, Retina - OU - Both Eyes       Right Eye Quality was good. Central Foveal Thickness: 271. Progression has been stable. Findings include normal foveal contour, no IRF, no SRF, retinal drusen .   Left Eye Quality was good. Central Foveal Thickness: 266. Progression has improved. Findings include normal foveal contour, intraretinal fluid, intraretinal hyper-reflective material, retinal drusen , no SRF (Persistent IRF/IRHM  temporal macula -- slightly improved).   Notes *Images captured and stored on drive  Diagnosis / Impression:  OD: non-exu ARMD w/ fine drusen OS: BRVO -- Persistent IRF/IRHM temporal macula -- slightly improved  Clinical management:  See below  Abbreviations: NFP - Normal foveal profile. CME - cystoid macular edema. PED - pigment epithelial detachment. IRF - intraretinal fluid. SRF - subretinal fluid. EZ - ellipsoid zone. ERM - epiretinal membrane. ORA - outer retinal atrophy. ORT - outer retinal tubulation. SRHM - subretinal hyper-reflective material. IRHM - intraretinal hyper-reflective material      Intravitreal Injection, Pharmacologic Agent - OS - Left Eye       Time Out 06/27/2021. 9:08 AM. Confirmed correct patient, procedure, site, and patient consented.   Anesthesia Topical anesthesia was used. Anesthetic medications included Proparacaine 0.5%, Lidocaine 2%.   Procedure Preparation included 5% betadine to ocular surface, eyelid speculum. A (32g) needle was used.   Injection: 2 mg aflibercept 2 MG/0.05ML   Route: Intravitreal, Site: Left Eye   NDC: A3590391, Lot: [id number], Expiration date: 03/13/2022, Waste: 0.05 mL   Post-op Post injection exam found visual acuity of at least counting fingers. The patient tolerated the procedure well. There were no complications. The patient received written and verbal post procedure care education. Post injection medications were not given.            ASSESSMENT/PLAN:   ICD-10-CM   1. Branch retinal vein occlusion of left eye with macular edema  H34.8320 OCT, Retina - OU - Both Eyes    Intravitreal Injection, Pharmacologic Agent - OS - Left Eye    aflibercept (EYLEA) SOLN 2 mg    2. Essential hypertension  I10     3. Hypertensive retinopathy of both eyes  H35.033     4. Early dry stage nonexudative age-related macular degeneration of both eyes  H35.3131     5. Pseudophakia, both eyes  Z96.1     6. Glaucoma  suspect of both eyes  H40.003      1. BRVO w/ CME OS             - S/P IVA OS #1 (10.18.21), #2 (11.15.21), #3 (12.13.21), #4 (01.10.22), #5 (02.07.22), #6 (03.07.22), #7 (04.04.22), #8 (05.02.22) -- IVA resistance  - S/P IVE OS #1 (05.31.22) -- sample, #2 (06.28.22), #3 (07.26.22), #4 (08.23.22), #5 (09.20.22), #6 (10.20.22), #7 (10.18.22)             - S/P focal laser OS (02.16.22) - BCVA stable at 20/30  - OCT shows persistent IRF/IRHM temporal macula -- slightly improved at 4 wks - discussed slow and steady progress of improvement and possibility of adding topical PF/Prolensa - recommend IVE OS #8 today, 12.15.22 w/ f/u in 4 wks - pt wishes to proceed - RBA of procedure discussed, questions answered - informed consent obtained and signed - see procedure note - Avastin informed consent obtained and signed, 10.18.21 (OS) - Eylea informed consent obtained and signed, 05.31.22 - [email or web address] paperwork completed 05.31.22 - F/U 4 weeks, DFE, OCT, possible injection   2,3. Hypertensive retinopathy OU - discussed importance of tight BP control - monitor  4. Age related macular degeneration, non-exudative, both eyes  - The incidence, anatomy, and pathology of dry AMD, risk of progression, and the AREDS and AREDS 2 study including smoking risks discussed with patient.  - Recommend amsler grid monitoring  5. Pseudophakia OU  - s/p CE/IOL  - IOL in good position, doing well  - monitor  6. Glaucoma Suspect  - IOP 17,16  - under the expert care of Dr. Jerline Pain  Ophthalmic Meds Ordered this visit:  Meds ordered this encounter  Medications   aflibercept (EYLEA) SOLN 2 mg      Return in  about 4 weeks (around 07/25/2021) for f/u BRVO OS, DFE, OCT.  There are no Patient Instructions on file for this visit.  This document serves as a record of services personally performed by Gardiner Sleeper, MD, PhD. It was created on their behalf by San Jetty. Owens Shark, OA an ophthalmic technician. The creation  of this record is the provider's dictation and/or activities during the visit.    Electronically signed by: San Jetty. Owens Shark, [place] 12.13.2022 10:35 AM  Gardiner Sleeper, M.D., Ph.D. Diseases & Surgery of the Retina and Vitreous [facility]  I have reviewed the above documentation for accuracy and completeness, and I agree with the above. Gardiner Sleeper, M.D., Ph.D. 06/27/21 10:35 AM   Abbreviations: M myopia (nearsighted); A astigmatism; H hyperopia (farsighted); P presbyopia; Mrx spectacle prescription;  CTL contact lenses; OD right eye; OS left eye; OU both eyes  XT exotropia; ET esotropia; PEK punctate epithelial keratitis; PEE punctate epithelial erosions; DES dry eye syndrome; MGD meibomian gland dysfunction; ATs artificial tears; PFAT's preservative free artificial tears; [place] nuclear sclerotic cataract; PSC posterior subcapsular cataract; ERM epi-retinal membrane; PVD posterior vitreous detachment; RD retinal detachment; DM diabetes mellitus; DR diabetic retinopathy; NPDR non-proliferative diabetic retinopathy; PDR proliferative diabetic retinopathy; CSME clinically significant macular edema; DME diabetic macular edema; dbh dot blot hemorrhages; CWS cotton wool spot; POAG primary open angle glaucoma; C/D cup-to-disc ratio; HVF humphrey visual field; GVF goldmann visual field; OCT optical coherence tomography; IOP intraocular pressure; BRVO Branch retinal vein occlusion; CRVO central retinal vein occlusion; CRAO central retinal artery occlusion; BRAO branch retinal artery occlusion; RT retinal tear; SB scleral buckle; PPV pars plana vitrectomy; VH Vitreous hemorrhage; PRP panretinal laser photocoagulation; IVK intravitreal kenalog; VMT vitreomacular traction; MH Macular hole;  NVD neovascularization of the disc; NVE neovascularization elsewhere; AREDS age related eye disease study; ARMD age related macular degeneration; POAG primary open angle glaucoma; EBMD epithelial/anterior  basement membrane dystrophy; ACIOL anterior chamber intraocular lens; IOL intraocular lens; PCIOL posterior chamber intraocular lens; Phaco/IOL phacoemulsification with intraocular lens placement; [place] photorefractive keratectomy; LASIK laser assisted in situ keratomileusis; HTN hypertension; DM diabetes mellitus; COPD chronic obstructive pulmonary disease

## 2021-06-25 NOTE — Telephone Encounter (Signed)
Called pt to discuss the below.

## 2021-06-26 DIAGNOSIS — H9193 Unspecified hearing loss, bilateral: Secondary | ICD-10-CM | POA: Insufficient documentation

## 2021-06-26 DIAGNOSIS — H9121 Sudden idiopathic hearing loss, right ear: Secondary | ICD-10-CM | POA: Insufficient documentation

## 2021-06-27 ENCOUNTER — Encounter (INDEPENDENT_AMBULATORY_CARE_PROVIDER_SITE_OTHER): Payer: Self-pay | Admitting: Ophthalmology

## 2021-06-27 ENCOUNTER — Ambulatory Visit (INDEPENDENT_AMBULATORY_CARE_PROVIDER_SITE_OTHER): Payer: Medicare Other | Admitting: Ophthalmology

## 2021-06-27 ENCOUNTER — Other Ambulatory Visit: Payer: Self-pay

## 2021-06-27 DIAGNOSIS — H353131 Nonexudative age-related macular degeneration, bilateral, early dry stage: Secondary | ICD-10-CM | POA: Diagnosis not present

## 2021-06-27 DIAGNOSIS — H40003 Preglaucoma, unspecified, bilateral: Secondary | ICD-10-CM

## 2021-06-27 DIAGNOSIS — I10 Essential (primary) hypertension: Secondary | ICD-10-CM

## 2021-06-27 DIAGNOSIS — Z961 Presence of intraocular lens: Secondary | ICD-10-CM

## 2021-06-27 DIAGNOSIS — H34832 Tributary (branch) retinal vein occlusion, left eye, with macular edema: Secondary | ICD-10-CM

## 2021-06-27 DIAGNOSIS — H35033 Hypertensive retinopathy, bilateral: Secondary | ICD-10-CM | POA: Diagnosis not present

## 2021-06-27 DIAGNOSIS — H3581 Retinal edema: Secondary | ICD-10-CM

## 2021-06-27 MED ORDER — AFLIBERCEPT 2MG/0.05ML IZ SOLN FOR KALEIDOSCOPE
2.0000 mg | INTRAVITREAL | Status: AC | PRN
  Administered 2021-06-27: 2 mg via INTRAVITREAL

## 2021-07-09 ENCOUNTER — Other Ambulatory Visit: Payer: Self-pay

## 2021-07-09 ENCOUNTER — Encounter (HOSPITAL_BASED_OUTPATIENT_CLINIC_OR_DEPARTMENT_OTHER): Payer: Self-pay | Admitting: Nurse Practitioner

## 2021-07-09 ENCOUNTER — Ambulatory Visit (INDEPENDENT_AMBULATORY_CARE_PROVIDER_SITE_OTHER): Payer: Medicare Other | Admitting: Nurse Practitioner

## 2021-07-09 VITALS — BP 130/82 | HR 82 | Ht 60.0 in | Wt 145.0 lb

## 2021-07-09 DIAGNOSIS — E039 Hypothyroidism, unspecified: Secondary | ICD-10-CM | POA: Diagnosis not present

## 2021-07-09 DIAGNOSIS — E210 Primary hyperparathyroidism: Secondary | ICD-10-CM

## 2021-07-09 DIAGNOSIS — H9120 Sudden idiopathic hearing loss, unspecified ear: Secondary | ICD-10-CM

## 2021-07-09 DIAGNOSIS — I10 Essential (primary) hypertension: Secondary | ICD-10-CM

## 2021-07-09 DIAGNOSIS — Z0000 Encounter for general adult medical examination without abnormal findings: Secondary | ICD-10-CM

## 2021-07-09 MED ORDER — AMLODIPINE BESYLATE 2.5 MG PO TABS: 2.5000 mg | ORAL_TABLET | Freq: Every day | ORAL | 1 refills | Status: DC

## 2021-07-09 NOTE — Assessment & Plan Note (Signed)
Managed by endocrinology. No concerning signs or symptoms present today.

## 2021-07-09 NOTE — Patient Instructions (Addendum)
Thank you for choosing [place] [facility] for your Primary Care needs. I am excited for the opportunity to partner with you to meet your health care goals. It was a pleasure meeting you today!  Recommendations from today's visit: We will plan to see you back in about 4 weeks to check on your blood pressures and get lab work.  I would like you to check your blood pressure in the morning and in the evening daily so we can make sure it is staying where it needs to and we don't have it going too low.  Your eye doctor appointment is scheduled for 07/15/2021, but it looks like you are not due to go back until 07/25/2021 based on what the doctor said. It may have been accidentally booked too [place] so I would call and make sure they want to see you on the second.   Information on diet, exercise, and health maintenance recommendations are listed below. This is information to help you be sure you are on track for optimal health and monitoring.   Please look over this and let us know if you have any questions or if you have completed any of the health maintenance outside of [place] so that we can be sure your records are up to date.  ___________________________________________________________ About Me: I am an Adult-Geriatric Nurse Practitioner with a background in caring for patients for more than 20 years with a strong intensive care background. I provide primary care and sports medicine services to patients age 59 and older within this office. My education had a strong focus on caring for the older adult population, which I am passionate about. I am also the director of the APP Fellowship with [facility].   My desire is to provide you with the best service through preventive medicine and supportive care. I consider you a part of the medical team and value your input. I work diligently to ensure that you are heard and your needs are met in a safe and effective manner. I want you  to feel comfortable with me as your provider and want you to know that your health concerns are important to me.  For your information, our office hours are: Monday, Tuesday, and Thursday 8:00 AM - 5:00 PM Wednesday and Friday 8:00 AM - 12:00 PM.   In my time away from the office I am teaching new APP's within the system and am unavailable, but my partner, Dr. Burnard Bunting is in the office for emergent needs.   If you have questions or concerns, please call our office at [phone] or send [place] a MyChart message and we will respond as quickly as possible.  ____________________________________________________________ MyChart:  For all urgent or time sensitive needs we ask that you please call the office to avoid delays. Our number is [phone]. MyChart is not constantly monitored and due to the large volume of messages a day, replies may take up to 72 business hours.  MyChart Policy: MyChart allows for you to see your visit notes, after visit summary, provider recommendations, lab and tests results, make an appointment, request refills, and contact your provider or the office for non-urgent questions or concerns. Providers are seeing patients during normal business hours and do not have built in time to review MyChart messages.  We ask that you allow a minimum of 3 business days for responses to [employer]. For this reason, please do not send urgent requests through [place]. Please call the office at  [phone]. New and ongoing conditions may require a visit. We have virtual and in person visit available for your convenience.  Complex MyChart concerns may require a visit. Your provider may request you schedule a virtual or in person visit to ensure we are providing the best care possible. MyChart messages sent after 11:00 AM on Friday will not be received by the provider until Monday morning.    Lab and Test Results: You will receive your lab and test results on MyChart as soon as they  are completed and results have been sent by the lab or testing facility. Due to this service, you will receive your results BEFORE your provider.  I review lab and tests results each morning prior to seeing patients. Some results require collaboration with other providers to ensure you are receiving the most appropriate care. For this reason, we ask that you please allow a minimum of 3-5 business days from the time the ALL results have been received for your provider to receive and review lab and test results and contact you about these.  Most lab and test result comments from the provider will be sent through [place]. Your provider may recommend changes to the plan of care, follow-up visits, repeat testing, ask questions, or request an office visit to discuss these results. You may reply directly to this message or call the office at [phone] to provide information for the provider or set up an appointment. In some instances, you will be called with test results and recommendations. Please let us know if this is preferred and we will make note of this in your chart to provide this for you.    If you have not heard a response to your lab or test results in 5 business days from all results returning to [place], please call the office to let us know. We ask that you please avoid calling prior to this time unless there is an emergent concern. Due to high call volumes, this can delay the resulting process.  After Hours: For all non-emergency after hours needs, please call the office at [phone] and select the option to reach the on-call provider service. On-call services are shared between multiple [place] offices and therefore it will not be possible to speak directly with your provider. On-call providers may provide medical advice and recommendations, but are unable to provide refills for maintenance medications.  For all emergency or urgent medical needs after normal business hours, we recommend  that you seek care at the closest Urgent Care or Emergency Department to ensure appropriate treatment in a timely manner.  [facility] has a 24 hour emergency room located on the ground floor for your convenience.   Urgent Concerns During the Business Day Providers are seeing patients from 8AM to [place] with a busy schedule and are most often not able to respond to non-urgent calls until the end of the day or the next business day. If you should have URGENT concerns during the day, please call and speak to the nurse or schedule a same day appointment so that we can address your concern without delay.   Thank you, again, for choosing me as your health care partner. I appreciate your trust and look forward to learning more about you.   Worthy Keeler, DNP, AGNP-c ___________________________________________________________  Health Maintenance Recommendations Screening Testing Mammogram Every 1 -2 years based on history and risk factors Starting at age 75 Pap Smear Ages 21-39 every 3 years Ages 45-65 every 5 years  with HPV testing More frequent testing may be required based on results and history Colon Cancer Screening Every 1-10 years based on test performed, risk factors, and history Starting at age 70 Bone Density Screening Every 2-10 years based on history Starting at age 48 for women Recommendations for men differ based on medication usage, history, and risk factors AAA Screening One time ultrasound Men 78-74 years old who have every smoked Lung Cancer Screening Low Dose Lung CT every 12 months Age 70-80 years with a 30 pack-year smoking history who still smoke or who have quit within the last 15 years  Screening Labs Routine  Labs: Complete Blood Count (CBC), Complete Metabolic Panel (CMP), Cholesterol (Lipid Panel) Every 6-12 months based on history and medications May be recommended more frequently based on current conditions or previous  results Hemoglobin A1c Lab Every 3-12 months based on history and previous results Starting at age 107 or earlier with diagnosis of diabetes, high cholesterol, BMI >26, and/or risk factors Frequent monitoring for patients with diabetes to ensure blood sugar control Thyroid Panel (TSH w/ T3 & T4) Every 6 months based on history, symptoms, and risk factors May be repeated more often if on medication HIV One time testing for all patients 56 and older May be repeated more frequently for patients with increased risk factors or exposure Hepatitis C One time testing for all patients 63 and older May be repeated more frequently for patients with increased risk factors or exposure Gonorrhea, Chlamydia Every 12 months for all sexually active persons 13-24 years Additional monitoring may be recommended for those who are considered high risk or who have symptoms PSA Men 39-91 years old with risk factors Additional screening may be recommended from age 80-69 based on risk factors, symptoms, and history  Vaccine Recommendations Tetanus Booster All adults every 10 years Flu Vaccine All patients 6 months and older every year COVID Vaccine All patients 12 years and older Initial dosing with booster May recommend additional booster based on age and health history HPV Vaccine 2 doses all patients age 11-26 Dosing may be considered for patients over 26 Shingles Vaccine (Shingrix) 2 doses all adults 23 years and older Pneumonia (Pneumovax 23) All adults 49 years and older May recommend earlier dosing based on health history Pneumonia (Prevnar 29) All adults 75 years and older Dosed 1 year after Pneumovax 23  Additional Screening, Testing, and Vaccinations may be recommended on an individualized basis based on family history, health history, risk factors, and/or exposure.  __________________________________________________________  Diet Recommendations for All Patients  I recommend that all  patients maintain a diet low in saturated fats, carbohydrates, and cholesterol. While this can be challenging at first, it is not impossible and small changes can make big differences.  Things to try: Decreasing the amount of soda, sweet tea, and/or juice to one or less per day and replace with water While water is always the first choice, if you do not like water you may consider adding a water additive without sugar to improve the taste other sugar free drinks Replace potatoes with a brightly colored vegetable at dinner Use healthy oils, such as canola oil or olive oil, instead of butter or hard margarine Limit your bread intake to two pieces or less a day Replace regular pasta with low carb pasta options Bake, broil, or grill foods instead of frying Monitor portion sizes  Eat smaller, more frequent meals throughout the day instead of large meals  An important thing to remember is, if  you love foods that are not great for your health, you don't have to give them up completely. Instead, allow these foods to be a reward when you have done well. Allowing yourself to still have special treats every once in a while is a nice way to tell yourself thank you for working hard to keep yourself healthy.   Also remember that every day is a new day. If you have a bad day and "fall off the wagon", you can still climb right back up and keep moving along on your journey!  We have resources available to help you!  Some websites that may be helpful include: www.http://carter.biz/  Www.[employer]Well[employer].com _____________________________________________________________  Activity Recommendations for All Patients  I recommend that all adults get at least 20 minutes of moderate physical activity that elevates your heart rate at least 5 days out of the week.  Some examples include: Walking or jogging at a pace that allows you to carry on a conversation Cycling (stationary bike or outdoors) Water aerobics Yoga Weight  lifting Dancing If physical limitations prevent you from putting stress on your joints, exercise in a pool or seated in a chair are excellent options.  Do determine your MAXIMUM heart rate for activity: YOUR AGE - 220 = MAX HeartRate   Remember! Do not push yourself too hard.  Start slowly and build up your pace, speed, weight, time in exercise, etc.  Allow your body to rest between exercise and get good sleep. You will need more water than normal when you are exerting yourself. Do not wait until you are thirsty to drink. Drink with a purpose of getting in at least 8, 8 ounce glasses of water a day plus more depending on how much you exercise and sweat.    If you begin to develop dizziness, chest pain, abdominal pain, jaw pain, shortness of breath, headache, vision changes, lightheadedness, or other concerning symptoms, stop the activity and allow your body to rest. If your symptoms are severe, seek emergency evaluation immediately. If your symptoms are concerning, but not severe, please let us know so that we can recommend further evaluation.

## 2021-07-09 NOTE — Assessment & Plan Note (Signed)
Blood pressure well controlled at 130/82 today. Patient does have at home list of blood pressure readings which show a steady decline since starting amlodipine on 06/21/2021.  Most recent at home blood pressure readings 104/58. Significant improvement in blood pressure since restart of amlodipine 2.5 mg with no significant side effects or lower extremity edema noted. She does have a few reported blood pressures below 629 systolic first thing in the morning however she is asymptomatic with these. Recommend continuing current medications and we will plan to have her check her blood pressure first thing in the morning and right before bed daily to evaluate if we need to continue twice daily dosing of clonidine or if this can be discontinued at the nighttime dose to help reduce the risk of hypotension. Strongly encourage patient to notify the office of any symptoms she experiences with lower blood pressures or if her blood pressure readings are less than 476 systolic and stay that way despite getting up and moving around. We will plan to follow-up in about 4 weeks and obtain labs at that time and evaluate whether dosing changes of medication need to be made.

## 2021-07-09 NOTE — Progress Notes (Addendum)
**Note De-Identified Janice Obfuscation** [name], DNP, AGNP-c Primary Care & Sports Medicine [address]   [place], [place] [address] [phone]  New patient visit   Patient: Janice Small   DOB: 06-21-1928   87 y.o. Female  MRN: [id number] Visit Date: 07/09/2021  Patient Care Team: Avalin Briley, Coralee Pesa, NP as PCP - General (Nurse Practitioner)  Today's healthcare provider: [name], NP   Chief Complaint  Patient presents with   Establish Care   Subjective    HPI  Janice Small is a pleasant 87 y.o. female who presents today as a new patient to establish care.  She presents today with her son Janice Small.   Kesha endorses a longstanding history of hypertension for which she is currently managed with amlodipine, clonidine, and lisinopril.  She is also taking furosemide which is managed by her endocrinologist.  She tells me today she has had some difficulty controlling her blood pressures and this is one of her greatest concerns today.   She was historically on amlodipine however this medication was stopped by her previous provider when she began to experience lower extremity edema.  She reports the edema stopped however her blood pressures became out of control once stopping the medication. She subsequently developed macular edema with a retinal vein occlusion and reports the retina specialist informed her this was directly related to a sudden significant increase in BP. She feels this was directly related to stopping the amlodipine.  She did see a provider recently for an acute visit and at that time was restarted on her amlodipine at 2.5 mg when her blood pressure readings were found to be 180/108 and 212/105. Since restarting the medication her blood pressures have been very well controlled at home.  She brings in a list of her blood pressure readings daily since starting on the medication which show a steady decrease with time. She denies headaches, weakness, dizziness, chest  pain, palpitations, lower extremity edema. She does endorse first thing in the morning when she takes her blood pressure that she has had some readings in the upper 90s over upper 50s.  She has no symptoms of lightheadedness, dizziness, fainting, weakness present.  She reports that these blood pressures increased up to above 100 fairly quickly after getting up and moving around without any symptoms.  She was recently seen for sudden onset of sensorineural hearing loss of unknown etiology.  That provider started her on a steroid taper and she was seen with audiology and otolaryngology for evaluation. She tells me after 3 to 4 days of prednisone use that her symptoms completely resolved and her hearing returned to normal.  She denies any recurrent symptoms since that time.  Past Medical History:  Diagnosis Date   BREAST BIOPSY, HX OF 11/27/2006   COLONIC POLYPS, HX OF 11/27/2006   HEMORRHOIDS 01/17/2010   HYPERTENSION 11/27/2006   Hypertensive retinopathy    OU   HYPOTHYROIDISM 11/27/2006   Macular degeneration    Dry OU   MENOPAUSAL SYNDROME 11/27/2006   Past Surgical History:  Procedure Laterality Date   ABDOMINAL HYSTERECTOMY     APPENDECTOMY     BREAST EXCISIONAL BIOPSY Right 1987   BREAST SURGERY     bx   CATARACT EXTRACTION Bilateral    EYE SURGERY Bilateral    Cat Sx OU   TONSILLECTOMY     Family Status  Relation Name Status   Mother  Deceased   Father  Deceased   Family History  Problem  Relation Age of Onset   Heart disease Father    Social History   Socioeconomic History   Marital status: Widowed    Spouse name: Not on file   Number of children: Not on file   Years of education: Not on file   Highest education level: Not on file  Occupational History   Not on file  Tobacco Use   Smoking status: Never   Smokeless tobacco: Never  Substance and Sexual Activity   Alcohol use: Yes    Comment: rarely   Drug use: No   Sexual activity: Not on file  Other Topics  Concern   Not on file  Social History Narrative   Not on file   Social Determinants of Health   Financial Resource Strain: Not on file  Food Insecurity: Not on file  Transportation Needs: Not on file  Physical Activity: Not on file  Stress: Not on file  Social Connections: Not on file   Outpatient Medications Prior to Visit  Medication Sig   cloNIDine (CATAPRES) 0.1 MG tablet TAKE 1 TABLET BY MOUTH 2 TIMES DAILY.   furosemide (LASIX) 20 MG tablet Take 1 tablet (20 mg total) by mouth daily.   levothyroxine (SYNTHROID) 75 MCG tablet Take 1 tablet (75 mcg total) by mouth daily.   lisinopril (ZESTRIL) 40 MG tablet Take 1 tablet (40 mg total) by mouth daily.   metroNIDAZOLE (METROGEL) 0.75 % gel Apply 1 application topically daily as needed.   [DISCONTINUED] amLODipine (NORVASC) 2.5 MG tablet Take 1 tablet (2.5 mg total) by mouth daily.   [DISCONTINUED] predniSONE (DELTASONE) 20 MG tablet Take three tablets by mouth once daily for 10 days (for sudden sensorineural hearing loss)   No facility-administered medications prior to visit.   Allergies  Allergen Reactions   Codeine Phosphate     REACTION: unspecified    Immunization History  Administered Date(s) Administered   Fluad Quad(high Dose 65+) 05/12/2019, 03/29/2020, 03/14/2021   Influenza Split 06/16/2011, 06/16/2012   Influenza Whole 04/14/2007, 04/20/2008, 07/03/2010   Influenza, High Dose Seasonal PF 05/31/2014, 04/02/2015, 04/11/2016, 04/10/2017, 06/04/2018   Influenza,inj,Quad PF,6+ Mos 04/01/2013   PFIZER Comirnaty(Gray Top)Covid-19 Tri-Sucrose Vaccine 01/13/2021   PFIZER(Purple Top)SARS-COV-2 Vaccination 09/11/2019, 10/11/2019, 05/09/2020   Pneumococcal Conjugate-13 06/17/2013   Pneumococcal Polysaccharide-23 06/16/2011   Tdap 06/16/2011    Health Maintenance  Topic Date Due   Zoster Vaccines- Shingrix (1 of 2) Never done   COVID-19 Vaccine (5 - Booster for Pfizer series) 03/10/2021   TETANUS/TDAP  06/15/2021    Pneumonia Vaccine 43+ Years old  Completed   INFLUENZA VACCINE  Completed   DEXA SCAN  Completed   HPV VACCINES  Aged Out    Patient Care Team: Annalina Needles, Coralee Pesa, NP as PCP - General (Nurse Practitioner)  Review of Systems All review of systems negative except what is listed in the HPI   Objective    BP 130/82    Pulse 82    Ht 5' (1.524 m)    Wt 145 lb (65.8 kg)    SpO2 98%    BMI 28.32 kg/m  Physical Exam Vitals and nursing note reviewed.  Constitutional:      General: She is not in acute distress.    Appearance: Normal appearance.  Eyes:     Extraocular Movements: Extraocular movements intact.     Conjunctiva/sclera: Conjunctivae normal.     Pupils: Pupils are equal, round, and reactive to light.     Funduscopic exam:    Right eye:  Red reflex present.        Left eye: Red reflex present. Neck:     Vascular: No carotid bruit.  Cardiovascular:     Rate and Rhythm: Normal rate and regular rhythm.     Pulses: Normal pulses.     Heart sounds: Normal heart sounds. No murmur heard. Pulmonary:     Effort: Pulmonary effort is normal.     Breath sounds: Normal breath sounds. No wheezing.  Abdominal:     General: Bowel sounds are normal.     Palpations: Abdomen is soft.  Musculoskeletal:        General: Normal range of motion.     Cervical back: Normal range of motion.     Right lower leg: No edema.     Left lower leg: No edema.  Lymphadenopathy:     Cervical: No cervical adenopathy.  Skin:    General: Skin is warm and dry.     Capillary Refill: Capillary refill takes less than 2 seconds.  Neurological:     General: No focal deficit present.     Mental Status: She is alert and oriented to person, place, and time.     Cranial Nerves: No cranial nerve deficit.     Motor: No weakness.  Psychiatric:        Mood and Affect: Mood normal.        Behavior: Behavior normal.        Thought Content: Thought content normal.        Judgment: Judgment normal.    Depression  Screen PHQ 2/9 Scores 07/09/2021 03/14/2021 02/03/2020 12/21/2018  PHQ - 2 Score 0 0 0 0  PHQ- 9 Score 0 0 0 0   No results found for any visits on 07/09/21.  Assessment & Plan      Problem List Items Addressed This Visit     Hypothyroidism    Managed by endocrinology. No concerning signs or symptoms present today.      Essential hypertension - Primary    Blood pressure well controlled at 130/82 today. Patient does have at home list of blood pressure readings which show a steady decline since starting amlodipine on 06/21/2021.  Most recent at home blood pressure readings 104/58. Significant improvement in blood pressure since restart of amlodipine 2.5 mg with no significant side effects or lower extremity edema noted. She does have a few reported blood pressures below 244 systolic first thing in the morning however she is asymptomatic with these. Recommend continuing current medications and we will plan to have her check her blood pressure first thing in the morning and right before bed daily to evaluate if we need to continue twice daily dosing of clonidine or if this can be discontinued at the nighttime dose to help reduce the risk of hypotension. Strongly encourage patient to notify the office of any symptoms she experiences with lower blood pressures or if her blood pressure readings are less than 010 systolic and stay that way despite getting up and moving around. We will plan to follow-up in about 4 weeks and obtain labs at that time and evaluate whether dosing changes of medication need to be made.      Relevant Medications   amLODipine (NORVASC) 2.5 MG tablet   Primary hyperparathyroidism ([place])    Managed by endocrinology. No concerning signs or symptoms present today.      Sudden right hearing loss    Sensorineural hearing loss has resolved with use of steroid taper and restart  of amlodipine.. She does have 1 more follow-up with the otolaryngologist however she has no concerning  findings present now. No clear etiology present- consider possible acute infectious source versus occlusion due to uncontrolled hypertension.  We will continue to monitor and inform patient that if symptoms return at any level to let us know immediately.  At that time we can always can consider MRI and reevaluation.      Other Visit Diagnoses     Encounter for medical examination to establish care            Return in about 4 weeks (around 08/06/2021) for HTN and labs.      Ketan Renz, Coralee Pesa, NP, DNP, AGNP-C [facility] at [place]

## 2021-07-09 NOTE — Assessment & Plan Note (Signed)
Sensorineural hearing loss has resolved with use of steroid taper and restart of amlodipine.. She does have 1 more follow-up with the otolaryngologist however she has no concerning findings present now. No clear etiology present- consider possible acute infectious source versus occlusion due to uncontrolled hypertension.  We will continue to monitor and inform patient that if symptoms return at any level to let us know immediately.  At that time we can always can consider MRI and reevaluation.

## 2021-07-11 ENCOUNTER — Ambulatory Visit: Payer: Medicare Other | Admitting: Internal Medicine

## 2021-07-15 ENCOUNTER — Encounter (INDEPENDENT_AMBULATORY_CARE_PROVIDER_SITE_OTHER): Payer: Medicare Other | Admitting: Ophthalmology

## 2021-07-15 ENCOUNTER — Ambulatory Visit: Payer: Medicare Other | Admitting: Internal Medicine

## 2021-07-18 ENCOUNTER — Other Ambulatory Visit (HOSPITAL_BASED_OUTPATIENT_CLINIC_OR_DEPARTMENT_OTHER): Payer: Self-pay

## 2021-07-18 ENCOUNTER — Ambulatory Visit: Payer: Medicare Other | Admitting: Internal Medicine

## 2021-07-18 DIAGNOSIS — E039 Hypothyroidism, unspecified: Secondary | ICD-10-CM

## 2021-07-18 DIAGNOSIS — I10 Essential (primary) hypertension: Secondary | ICD-10-CM

## 2021-07-18 MED ORDER — LEVOTHYROXINE SODIUM 75 MCG PO TABS: 75.0000 ug | ORAL_TABLET | Freq: Every day | ORAL | 1 refills | Status: DC

## 2021-07-18 MED ORDER — CLONIDINE HCL 0.1 MG PO TABS: 0.1000 mg | ORAL_TABLET | Freq: Two times a day (BID) | ORAL | 1 refills | Status: DC

## 2021-07-18 MED ORDER — AMLODIPINE BESYLATE 2.5 MG PO TABS
2.5000 mg | ORAL_TABLET | Freq: Every day | ORAL | 1 refills | Status: DC
Start: 2021-07-18 — End: 2021-11-30

## 2021-07-18 MED ORDER — FUROSEMIDE 20 MG PO TABS: 20.0000 mg | ORAL_TABLET | Freq: Every day | ORAL | 1 refills | Status: DC

## 2021-07-23 NOTE — Progress Notes (Signed)
[facility] Note  07/25/2021     CHIEF COMPLAINT Patient presents for Retina Follow Up    HISTORY OF PRESENT ILLNESS: Janice Small is a 88 y.o. female who presents to the clinic today for:  HPI     Retina Follow Up   Patient presents with  CRVO/BRVO.  In left eye.  I, the attending physician,  performed the HPI with the patient and updated documentation appropriately.        Comments   Patient states vision the same OU.       Last edited by Bernarda Caffey, MD on 07/25/2021  9:30 AM.     Pt states vision is stable, she has switched PCP's and started taking a low dose of amlodipine for blood pressure, pt was taking prednisone last month   Referring physician: Isaac Bliss, Rayford Halsted, MD [place],  [place] [address]  HISTORICAL INFORMATION:   Selected notes from the MEDICAL RECORD NUMBER Referred by Dr. Jerline Pain for eval of BRVO OS LEE: 10.12.2021 Ocular Hx- Glc suspect   CURRENT MEDICATIONS: No current outpatient medications on file. (Ophthalmic Drugs)   No current facility-administered medications for this visit. (Ophthalmic Drugs)   Current Outpatient Medications (Other)  Medication Sig   amLODipine (NORVASC) 2.5 MG tablet Take 1 tablet (2.5 mg total) by mouth daily.   cloNIDine (CATAPRES) 0.1 MG tablet Take 1 tablet (0.1 mg total) by mouth 2 (two) times daily.   furosemide (LASIX) 20 MG tablet Take 1 tablet (20 mg total) by mouth daily.   levothyroxine (SYNTHROID) 75 MCG tablet Take 1 tablet (75 mcg total) by mouth daily.   lisinopril (ZESTRIL) 40 MG tablet Take 1 tablet (40 mg total) by mouth daily.   metroNIDAZOLE (METROGEL) 0.75 % gel Apply 1 application topically daily as needed.   No current facility-administered medications for this visit. (Other)   REVIEW OF SYSTEMS: ROS   Positive for: Gastrointestinal, Musculoskeletal, Eyes Negative for: Constitutional, Neurological, Skin, Genitourinary, HENT,  Endocrine, Cardiovascular, Respiratory, Psychiatric, Allergic/Imm, Heme/Lymph Last edited by Roselee Nova D, COT on 07/25/2021  9:03 AM.     ALLERGIES Allergies  Allergen Reactions   Codeine Phosphate     REACTION: unspecified   PAST MEDICAL HISTORY Past Medical History:  Diagnosis Date   BREAST BIOPSY, HX OF 11/27/2006   COLONIC POLYPS, HX OF 11/27/2006   HEMORRHOIDS 01/17/2010   HYPERTENSION 11/27/2006   Hypertensive retinopathy    OU   HYPOTHYROIDISM 11/27/2006   Macular degeneration    Dry OU   MENOPAUSAL SYNDROME 11/27/2006   Past Surgical History:  Procedure Laterality Date   ABDOMINAL HYSTERECTOMY     APPENDECTOMY     BREAST EXCISIONAL BIOPSY Right 1987   BREAST SURGERY     bx   CATARACT EXTRACTION Bilateral    EYE SURGERY Bilateral    Cat Sx OU   TONSILLECTOMY     FAMILY HISTORY Family History  Problem Relation Age of Onset   Heart disease Father    SOCIAL HISTORY Social History   Tobacco Use   Smoking status: Never   Smokeless tobacco: Never  Substance Use Topics   Alcohol use: Yes    Comment: rarely   Drug use: No       OPHTHALMIC EXAM: Base Eye Exam     Visual Acuity (Snellen - Linear)       Right Left   Dist cc 20/25 20/40 +2   Dist ph cc NI 20/25 -  1    Correction: Glasses         Tonometry (Tonopen, 9:03 AM)       Right Left   Pressure 18 19         Pupils       Dark Light Shape React APD   Right 3 2 Round Minimal None   Left 3 2 Round Minimal None         Visual Fields (Counting fingers)       Left Right    Full Full         Extraocular Movement       Right Left    Full, Ortho Full, Ortho         Neuro/Psych     Oriented x3: Yes   Mood/Affect: Normal         Dilation     Both eyes: 1.0% Mydriacyl, 2.5% Phenylephrine @ 9:03 AM           Slit Lamp and Fundus Exam     Slit Lamp Exam       Right Left   Lids/Lashes Dermatochalasis - upper lid Dermatochalasis - upper lid, mild Meibomian gland  dysfunction   Conjunctiva/Sclera White and quiet White and quiet   Cornea 1-2+ fine Punctate epithelial erosions, well healed temporal cataract wounds, arcus, early band K nasal and temporal 2-3+ inferior Punctate epithelial erosions, well healed temporal cataract wounds, arcus   Anterior Chamber Deep and quiet Deep and quiet   Iris Round and dilated Round and dilated   Lens 3 piece PC IOL in good position with open PC 3 piece PC IOL in good position, 1+PCO (non-central)   Anterior Vitreous Vitreous syneresis, Posterior vitreous detachment Vitreous syneresis, Posterior vitreous detachment         Fundus Exam       Right Left   Disc Pink and Sharp, Compact mild Pallor, Sharp rim, temporal PPA, Compact   C/D Ratio 0.3 0.2   Macula Flat, Blunted foveal reflex, mild Drusen, RPE mottling and clumping, no heme or edema Blunted foveal reflex, focal IRH and exudates temporal macula -- slightly improved, cystic changes and edema -- slightly increased, +laser changes, Drusen   Vessels attenuated, Tortuous Tortuous, severe attenuation of vessels superior macula   Periphery Attached, No heme  Attached, mild reticular degeneration           IMAGING AND PROCEDURES  Imaging and Procedures for 07/25/2021  OCT, Retina - OU - Both Eyes       Right Eye Quality was good. Central Foveal Thickness: 269. Progression has been stable. Findings include normal foveal contour, no IRF, no SRF, retinal drusen .   Left Eye Quality was good. Central Foveal Thickness: 272. Progression has worsened. Findings include normal foveal contour, intraretinal fluid, intraretinal hyper-reflective material, retinal drusen , no SRF (Interval increase in IRF/edema temporal macula ).   Notes *Images captured and stored on drive  Diagnosis / Impression:  OD: non-exu ARMD w/ fine drusen OS: BRVO -- Interval increase in IRF/edema temporal macula   Clinical management:  See below  Abbreviations: NFP - Normal foveal  profile. CME - cystoid macular edema. PED - pigment epithelial detachment. IRF - intraretinal fluid. SRF - subretinal fluid. EZ - ellipsoid zone. ERM - epiretinal membrane. ORA - outer retinal atrophy. ORT - outer retinal tubulation. SRHM - subretinal hyper-reflective material. IRHM - intraretinal hyper-reflective material      Intravitreal Injection, Pharmacologic Agent - OS - Left Eye  Time Out 07/25/2021. 9:15 AM. Confirmed correct patient, procedure, site, and patient consented.   Anesthesia Topical anesthesia was used. Anesthetic medications included Proparacaine 0.5%, Lidocaine 2%.   Procedure Preparation included 5% betadine to ocular surface, eyelid speculum. A (32g) needle was used.   Injection: 2 mg aflibercept 2 MG/0.05ML   Route: Intravitreal, Site: Left Eye   NDC: [id number], Lot: [id number], Expiration date: 06/12/2022, Waste: 0.05 mL   Post-op Post injection exam found visual acuity of at least counting fingers. The patient tolerated the procedure well. There were no complications. The patient received written and verbal post procedure care education. Post injection medications were not given.            ASSESSMENT/PLAN:   ICD-10-CM   1. Branch retinal vein occlusion of left eye with macular edema  H34.8320 OCT, Retina - OU - Both Eyes    Intravitreal Injection, Pharmacologic Agent - OS - Left Eye    aflibercept (EYLEA) SOLN 2 mg    2. Essential hypertension  I10     3. Hypertensive retinopathy of both eyes  H35.033     4. Early dry stage nonexudative age-related macular degeneration of both eyes  H35.3131     5. Pseudophakia, both eyes  Z96.1     6. Glaucoma suspect of both eyes  H40.003      1. BRVO w/ CME OS             - S/P IVA OS #1 (10.18.21), #2 (11.15.21), #3 (12.13.21), #4 (01.10.22), #5 (02.07.22), #6 (03.07.22), #7 (04.04.22), #8 (05.02.22) -- IVA resistance  - S/P IVE OS #1 (05.31.22) -- sample, #2 (06.28.22), #3 (07.26.22), #4  (08.23.22), #5 (09.20.22), #6 (10.20.22), #7 (10.18.22), #8 (12.15.22)             - S/P focal laser OS (02.16.22) - BCVA stable at 20/25  - OCT shows interval increase in IRF/edema temporal macula  at 4 wks - discussed up and down nature of edema--consider adding topical PF/Prolensa - recommend IVE OS #9 today, 01.12.23 w/ f/u in 4 wks - pt wishes to proceed - RBA of procedure discussed, questions answered - informed consent obtained and signed - see procedure note - Avastin informed consent obtained and signed, 10.18.21 (OS) - Eylea informed consent obtained and signed, 05.31.22 - Eylea4U paperwork completed 05.31.22 - F/U 4 weeks, DFE, OCT, possible injection, ?consider PF/Prolensa   2,3. Hypertensive retinopathy OU - discussed importance of tight BP control - monitor  4. Age related macular degeneration, non-exudative, both eyes  - The incidence, anatomy, and pathology of dry AMD, risk of progression, and the AREDS and AREDS 2 study including smoking risks discussed with patient.  - Recommend amsler grid monitoring  5. Pseudophakia OU  - s/p CE/IOL  - IOL in good position, doing well  - monitor  6. Glaucoma Suspect  - IOP 18,19  - under the expert care of Dr. Jerline Pain  Ophthalmic Meds Ordered this visit:  Meds ordered this encounter  Medications   aflibercept (EYLEA) SOLN 2 mg     Return in about 4 weeks (around 08/22/2021) for f/u BRVO OS, DFE, OCT.  There are no Patient Instructions on file for this visit.  This document serves as a record of services personally performed by Gardiner Sleeper, MD, PhD. It was created on their behalf by San Jetty. Owens Shark, OA an ophthalmic technician. The creation of this record is the provider's dictation and/or activities during the visit.    Electronically signed  by: San Jetty Owens Shark, [place] 01.10.2023 7:36 PM  Gardiner Sleeper, M.D., Ph.D. Diseases & Surgery of the Retina and Vitreous [facility]  I have reviewed the  above documentation for accuracy and completeness, and I agree with the above. Gardiner Sleeper, M.D., Ph.D. 07/25/21 7:40 PM  Abbreviations: M myopia (nearsighted); A astigmatism; H hyperopia (farsighted); P presbyopia; Mrx spectacle prescription;  CTL contact lenses; OD right eye; OS left eye; OU both eyes  XT exotropia; ET esotropia; PEK punctate epithelial keratitis; PEE punctate epithelial erosions; DES dry eye syndrome; MGD meibomian gland dysfunction; ATs artificial tears; PFAT's preservative free artificial tears; [place] nuclear sclerotic cataract; PSC posterior subcapsular cataract; ERM epi-retinal membrane; PVD posterior vitreous detachment; RD retinal detachment; DM diabetes mellitus; DR diabetic retinopathy; NPDR non-proliferative diabetic retinopathy; PDR proliferative diabetic retinopathy; CSME clinically significant macular edema; DME diabetic macular edema; dbh dot blot hemorrhages; CWS cotton wool spot; POAG primary open angle glaucoma; C/D cup-to-disc ratio; HVF humphrey visual field; GVF goldmann visual field; OCT optical coherence tomography; IOP intraocular pressure; BRVO Branch retinal vein occlusion; CRVO central retinal vein occlusion; CRAO central retinal artery occlusion; BRAO branch retinal artery occlusion; RT retinal tear; SB scleral buckle; PPV pars plana vitrectomy; VH Vitreous hemorrhage; PRP panretinal laser photocoagulation; IVK intravitreal kenalog; VMT vitreomacular traction; MH Macular hole;  NVD neovascularization of the disc; NVE neovascularization elsewhere; AREDS age related eye disease study; ARMD age related macular degeneration; POAG primary open angle glaucoma; EBMD epithelial/anterior basement membrane dystrophy; ACIOL anterior chamber intraocular lens; IOL intraocular lens; PCIOL posterior chamber intraocular lens; Phaco/IOL phacoemulsification with intraocular lens placement; [place] photorefractive keratectomy; LASIK laser assisted in situ keratomileusis; HTN hypertension;  DM diabetes mellitus; COPD chronic obstructive pulmonary disease

## 2021-07-25 ENCOUNTER — Other Ambulatory Visit: Payer: Self-pay

## 2021-07-25 ENCOUNTER — Ambulatory Visit (INDEPENDENT_AMBULATORY_CARE_PROVIDER_SITE_OTHER): Payer: Medicare Other | Admitting: Ophthalmology

## 2021-07-25 ENCOUNTER — Encounter (INDEPENDENT_AMBULATORY_CARE_PROVIDER_SITE_OTHER): Payer: Self-pay | Admitting: Ophthalmology

## 2021-07-25 DIAGNOSIS — H353131 Nonexudative age-related macular degeneration, bilateral, early dry stage: Secondary | ICD-10-CM | POA: Diagnosis not present

## 2021-07-25 DIAGNOSIS — H35033 Hypertensive retinopathy, bilateral: Secondary | ICD-10-CM

## 2021-07-25 DIAGNOSIS — Z961 Presence of intraocular lens: Secondary | ICD-10-CM

## 2021-07-25 DIAGNOSIS — H34832 Tributary (branch) retinal vein occlusion, left eye, with macular edema: Secondary | ICD-10-CM

## 2021-07-25 DIAGNOSIS — H40003 Preglaucoma, unspecified, bilateral: Secondary | ICD-10-CM

## 2021-07-25 DIAGNOSIS — I10 Essential (primary) hypertension: Secondary | ICD-10-CM

## 2021-07-25 MED ORDER — AFLIBERCEPT 2MG/0.05ML IZ SOLN FOR KALEIDOSCOPE
2.0000 mg | INTRAVITREAL | Status: AC | PRN
  Administered 2021-07-25: 2 mg via INTRAVITREAL

## 2021-07-25 NOTE — Telephone Encounter (Signed)
2023 VOB initiated for Prolia °

## 2021-08-01 ENCOUNTER — Ambulatory Visit: Payer: Medicare Other | Admitting: Internal Medicine

## 2021-08-01 ENCOUNTER — Other Ambulatory Visit: Payer: Self-pay

## 2021-08-01 ENCOUNTER — Encounter: Payer: Self-pay | Admitting: Internal Medicine

## 2021-08-01 VITALS — BP 154/92 | HR 76 | Ht 60.0 in | Wt 146.4 lb

## 2021-08-01 DIAGNOSIS — M818 Other osteoporosis without current pathological fracture: Secondary | ICD-10-CM | POA: Diagnosis not present

## 2021-08-01 DIAGNOSIS — E039 Hypothyroidism, unspecified: Secondary | ICD-10-CM

## 2021-08-01 DIAGNOSIS — E210 Primary hyperparathyroidism: Secondary | ICD-10-CM | POA: Diagnosis not present

## 2021-08-01 DIAGNOSIS — E559 Vitamin D deficiency, unspecified: Secondary | ICD-10-CM

## 2021-08-01 LAB — BASIC METABOLIC PANEL
BUN: 22 mg/dL (ref 6–23)
CO2: 29 mEq/L (ref 19–32)
Calcium: 10.4 mg/dL (ref 8.4–10.5)
Chloride: 103 mEq/L (ref 96–112)
Creatinine, Ser: 0.92 mg/dL (ref 0.40–1.20)
GFR: 54.36 mL/min — ABNORMAL LOW (ref >60.00)
Glucose, Bld: 91 mg/dL (ref 70–99)
Potassium: 4.5 mEq/L (ref 3.5–5.1)
Sodium: 140 mEq/L (ref 135–145)

## 2021-08-01 LAB — VITAMIN D 25 HYDROXY (VIT D DEFICIENCY, FRACTURES): VITD: 43.12 ng/mL (ref 30.00–100.00)

## 2021-08-01 LAB — TSH: TSH: 2.74 u[IU]/mL (ref 0.35–5.50)

## 2021-08-01 NOTE — Patient Instructions (Signed)
Please stop at the lab.  Continue vitamin D 2000 units daily.  Continue Levothyroxine 75 mcg daily.  Take the thyroid hormone every day, with water, at least 30 minutes before breakfast, separated by at least 4 hours from: - acid reflux medications - calcium - iron - multivitamins  Please call and schedule bone density scan at the [facility] Office: [phone].  Please come back for a follow-up appointment in 1 year

## 2021-08-01 NOTE — Progress Notes (Signed)
Patient ID: Janice Small, female   DOB: 11-Jun-1928, 88 y.o.   MRN: [id number]   This visit occurred during the SARS-CoV-2 public health emergency.  Safety protocols were in place, including screening questions prior to the visit, additional usage of staff PPE, and extensive cleaning of exam room while observing appropriate contact time as indicated for disinfecting solutions.   HPI  Janice Small is a 88 y.o.-year-old female-year-old female, initially referred by her PCP, Dr. Jerilee Hoh, returning for follow-up for hypercalcemia/hyperparathyroidism, vitamin D insufficiency, and hypothyroidism.  Last visit 1 year and 1 month ago.  Interim history: No falls or fractures since last visit. No dizziness, vertigo, orthostasis. Uses a cane. She had Amlodipine added back for HTN. She established care with a new PCP since last OV.  Hypercalcemia  -For many years, at least since 2008.    At the end of 2019 she was also found to have a high PTH was referred to endocrinology.  I reviewed her pertinent labs Lab Results  Component Value Date   PTH 69 (H) 05/12/2019   PTH Comment 05/12/2019   PTH 32 12/03/2018   PTH 45 09/23/2018   PTH 92 (H) 06/14/2018   Lab Results  Component Value Date   CALCIUM 10.8 (H) 03/29/2020   CALCIUM 10.7 (H) 11/09/2019   CALCIUM 10.5 (H) 05/12/2019   CALCIUM 11.3 (H) 12/03/2018   CALCIUM 11.0 (H) 12/03/2018   CALCIUM 11.6 (H) 09/23/2018   CALCIUM 10.6 (H) 06/14/2018   CALCIUM 10.7 (H) 06/04/2018   CALCIUM 10.6 (H) 10/12/2017   CALCIUM 10.4 10/08/2016   CALCIUM 11.0 (H) 10/01/2015   CALCIUM 10.8 (H) 09/29/2014   CALCIUM 10.7 (H) 06/17/2013   CALCIUM 11.3 (H) 06/16/2012   CALCIUM 10.4 06/16/2011   CALCIUM 11.0 (H) 06/12/2010   CALCIUM 11.0 (H) 06/04/2009   CALCIUM 10.7 (H) 05/31/2008   CALCIUM 10.9 (H) 04/14/2007   Not on calcium supplements.  Reviewed previous investigations: Thyroid ultrasound (08/20/2018): 0.7 x 0.5 x 0.6 nodule at the left inferior thyroid pole,  possibly a parathyroid adenoma  Technetium sestamibi parathyroid scan (08/20/2018): Suspicious for left inferior parathyroid adenoma  She would prefer to avoid surgery.  Osteoporosis:  Reviewed the most recent DXA scan report: 06/29/2018 ([place]) Lumbar spine L1-L4 Femoral neck (FN) 33% distal radius  ultra distal radius  T-score   +1.1 RFN: -2.3 LFN: -1.9  -3.8  -4.5    She has a history of an ankle fracture several years ago.  She is on: - Prolia: 12/24/2018, 08/04/2019, 02/09/2020, 08/31/2020: 03/01/2021  She was going to [employer] - 3x a week - weight bearing exercises, however, she stopped during the coronavirus.  She has a h/o scoliosis. Has back pain.  No history of kidney stones.  + history of mild CKD.  Latest BUN/Cr: Lab Results  Component Value Date   BUN 29 (H) 03/29/2020   BUN 29 (H) 11/09/2019   CREATININE 0.93 (H) 03/29/2020   CREATININE 0.88 11/09/2019   At last visit she was on HCTZ and we switched to Lasix 20 mg daily. Prev. On Amlodipine >> developed LE edema. However, this was restarted recently.  She has a history of vitamin D insufficiency: Lab Results  Component Value Date   VD25OH 58.20 11/09/2019   VD25OH 48.47 05/12/2019   VD25OH 32.98 12/03/2018   VD25OH 23.46 (L) 09/23/2018  She continues on 2000 units vitamin D daily.  She denies a family history of hypercalcemia, pituitary tumors, thyroid cancer, or osteoporosis.  Hypothyroidism: -Controlled  Pt  is on levothyroxine 75 mcg daily, taken: - in am - fasting - at least 30 min from b'fast - no calcium - no iron - no multivitamins - no PPIs - not on Biotin She takes Magnesium, B12, vitamin C - later in the day.  Reviewed her TSH levels: Lab Results  Component Value Date   TSH 2.56 07/10/2020   TSH 1.65 11/09/2019   TSH 1.47 12/03/2018   TSH 2.30 09/23/2018   TSH 2.15 10/12/2017   TSH 2.54 10/08/2016   TSH 1.93 10/01/2015   TSH 1.69 09/29/2014   TSH 2.46 06/17/2013   TSH  1.21 06/16/2012   TSH 2.09 06/16/2011   TSH 1.84 06/12/2010   TSH 1.82 06/04/2009   TSH 1.85 05/31/2008   TSH 2.11 04/14/2007   Pt denies: - feeling nodules in neck - hoarseness - dysphagia - choking - SOB with lying down  She takes B12, vitamin C, magnesium. She also has a history of HTN. She continues IO injections.  She has a history of BRAO.  ROS: + see HPI Musculoskeletal: + muscle aches/+ joint aches  I reviewed pt's medications, allergies, PMH, social hx, family hx, and changes were documented in the history of present illness. Otherwise, unchanged from my initial visit note.  Past Medical History:  Diagnosis Date   BREAST BIOPSY, HX OF 11/27/2006   COLONIC POLYPS, HX OF 11/27/2006   HEMORRHOIDS 01/17/2010   HYPERTENSION 11/27/2006   Hypertensive retinopathy    OU   HYPOTHYROIDISM 11/27/2006   Macular degeneration    Dry OU   MENOPAUSAL SYNDROME 11/27/2006   Past Surgical History:  Procedure Laterality Date   ABDOMINAL HYSTERECTOMY     APPENDECTOMY     BREAST EXCISIONAL BIOPSY Right 1987   BREAST SURGERY     bx   CATARACT EXTRACTION Bilateral    EYE SURGERY Bilateral    Cat Sx OU   TONSILLECTOMY     Social History   Socioeconomic History   Marital status: Widowed -husband died 47    Spouse name: Not on file   Number of children: 3   Years of education: Not on file   Highest education level: Not on file  Occupational History    Retired  Scientist, product/process development strain: Not on file   Food insecurity:    Worry: Not on file    Inability: Not on file   Transportation needs:    Medical: Not on file    Non-medical: Not on file  Tobacco Use   Smoking status: Never Smoker   Smokeless tobacco: Never Used  Substance and Sexual Activity   Alcohol use:  No       Drug use: No   Current Outpatient Medications on File Prior to Visit  Medication Sig Dispense Refill   amLODipine (NORVASC) 2.5 MG tablet Take 1 tablet (2.5 mg total) by mouth daily.  90 tablet 1   cloNIDine (CATAPRES) 0.1 MG tablet Take 1 tablet (0.1 mg total) by mouth 2 (two) times daily. 180 tablet 1   furosemide (LASIX) 20 MG tablet Take 1 tablet (20 mg total) by mouth daily. 90 tablet 1   levothyroxine (SYNTHROID) 75 MCG tablet Take 1 tablet (75 mcg total) by mouth daily. 90 tablet 1   lisinopril (ZESTRIL) 40 MG tablet Take 1 tablet (40 mg total) by mouth daily. 90 tablet 1   metroNIDAZOLE (METROGEL) 0.75 % gel Apply 1 application topically daily as needed. 45 g 1   No current  facility-administered medications on file prior to visit.   Allergies  Allergen Reactions   Codeine Phosphate     REACTION: unspecified   Family History  Problem Relation Age of Onset   Heart disease Father   Also, diabetes and HTN and son.  PE: BP (!) 154/92 (BP Location: Left Arm, Patient Position: Sitting, Cuff Size: Normal)    Pulse 76    Ht 5' (1.524 m)    Wt 146 lb 6.4 oz (66.4 kg)    SpO2 99%    BMI 28.59 kg/m  Wt Readings from Last 3 Encounters:  08/01/21 146 lb 6.4 oz (66.4 kg)  07/09/21 145 lb (65.8 kg)  06/19/21 151 lb 1.6 oz (68.5 kg)   Constitutional: normal weight, in NAD Eyes: PERRLA, EOMI, no exophthalmos ENT: moist mucous membranes, no thyromegaly, no cervical lymphadenopathy Cardiovascular: RRR, No MRG, no LE edema Respiratory: CTA B Musculoskeletal: + deformities (scoliosis, Heberden and Bouchard nodules on the hands), strength intact in all 4 Skin: moist, warm, no rashes Neurological: + tremor with outstretched hands, DTR normal in all 4  Assessment: 1. Hypercalcemia/hyperparathyroidism  2. Hypothyroidism  3.  Vitamin D insufficiency  4.  Osteoporosis  Plan: Patient with several instances of elevated calcium, with the highest being 11.6.  A corresponding intact PTH was also high, at 92.  After normalizing her vitamin D level, the PTH decreased to 32, which was still inappropriately high in the setting of a calcium of 11.3.  At last visit, we checked an  ionized calcium and this was also slightly high, at 5.8 in 06/2020 (4.8-5.6). -She does have osteoporosis as a possible complication from hyperparathyroidism.  This is more pronounced at the level of the 33% distal radius and ultra distal radius and she also has a history of ankle fracture.  No abdominal pain, depression, bone pain, kidney stones.  Previous imaging tests pointed towards a left inferior parathyroid adenoma but per her preference, and also due to age, we are following her without surgery. -At last visit we changed from HCTZ to Lasix -At today's visit, we will recheck her calcium level  2.  Hypothyroidism - latest thyroid labs reviewed with pt. >> normal: Lab Results  Component Value Date   TSH 2.56 07/10/2020  - she continues on LT4 75 mcg daily - pt feels good on this dose. - we discussed about taking the thyroid hormone every day, with water, >30 minutes before breakfast, separated by >4 hours from acid reflux medications, calcium, iron, multivitamins. Pt. is taking it correctly. - will check thyroid tests today: TSH  - If labs are abnormal, she will need to return for repeat TFTs in 1.5 months  3.  Vitamin D insufficiency -She had a low vitamin D level in 09/2018 so she was started on 2000 units vitamin D daily -Latest vitamin D level was normal in 10/2019: 58.2 -We will recheck the level today  4. OP - likely postmenopausal/age-related + due to hyperparathyroidism -Reviewed her bone density results from 2019: Significant osteoporosis at the ultra distal radius level, a surrogate marker for trabecular bone -more accurate than the spine BMD due to calcium accumulation in the setting of osteoarthritis -We started Prolia in 12/2018 and she had 5 injections so far, last on 03/01/2021 -She tolerates Prolia well, without thigh/hip pain, or jaw pain -We discussed that we can continue Prolia for up to 10 years and possibly even more if needed -We discussed that Prolia can also  help with high calcium levels -At  last visit I ordered another bone density scan but she did not have this yet  - again advised to schedule -I will see her back in 1 year  Component     Latest Ref Rng & Units 08/01/2021          Sodium     135 - 145 mEq/L 140  Potassium     3.5 - 5.1 mEq/L 4.5  Chloride     96 - 112 mEq/L 103  CO2     19 - 32 mEq/L 29  Glucose     70 - 99 mg/dL 91  BUN     6 - 23 mg/dL 22  Creatinine     0.40 - 1.20 mg/dL 0.92  Calcium     8.4 - 10.5 mg/dL 10.4  GFR     >60.00 mL/min 54.36 (L)  TSH     0.35 - 5.50 uIU/mL 2.74  VITD     30.00 - 100.00 ng/mL 43.12    Philemon Kingdom, MD PhD [facility] Endocrin[facility]

## 2021-08-06 NOTE — Telephone Encounter (Signed)
Prior auth required for PROLIA  PA PROCESS DETAILS: Please complete the prior authorization form located at [email or web address] or call [phone]  

## 2021-08-07 ENCOUNTER — Other Ambulatory Visit: Payer: Self-pay

## 2021-08-07 ENCOUNTER — Encounter (HOSPITAL_BASED_OUTPATIENT_CLINIC_OR_DEPARTMENT_OTHER): Payer: Self-pay | Admitting: Nurse Practitioner

## 2021-08-07 ENCOUNTER — Ambulatory Visit (HOSPITAL_BASED_OUTPATIENT_CLINIC_OR_DEPARTMENT_OTHER): Payer: Medicare Other | Admitting: Nurse Practitioner

## 2021-08-07 VITALS — BP 142/86 | HR 76 | Ht 60.0 in | Wt 147.0 lb

## 2021-08-07 DIAGNOSIS — M810 Age-related osteoporosis without current pathological fracture: Secondary | ICD-10-CM | POA: Diagnosis not present

## 2021-08-07 DIAGNOSIS — K648 Other hemorrhoids: Secondary | ICD-10-CM

## 2021-08-07 DIAGNOSIS — I10 Essential (primary) hypertension: Secondary | ICD-10-CM | POA: Diagnosis not present

## 2021-08-07 DIAGNOSIS — L719 Rosacea, unspecified: Secondary | ICD-10-CM

## 2021-08-07 DIAGNOSIS — E039 Hypothyroidism, unspecified: Secondary | ICD-10-CM

## 2021-08-07 MED ORDER — METRONIDAZOLE 0.75 % EX GEL: 1.0000 "application " | Freq: Every day | CUTANEOUS | 3 refills | Status: DC | PRN

## 2021-08-07 MED ORDER — LISINOPRIL 40 MG PO TABS: 40.0000 mg | ORAL_TABLET | Freq: Every day | ORAL | 3 refills | Status: DC

## 2021-08-07 NOTE — Patient Instructions (Addendum)
Be sure to stay good and hydrated to help your kidneys.   I do want you to keep checking your blood pressure at home and let me know if your blood pressure is running higher than 150/90.  I have sent the referral to the GI doctor so that we can see if there is anything we can do about the hemorrhoids.   I sent the referral for the bone density to this location. They will call you to schedule this.

## 2021-08-07 NOTE — Progress Notes (Addendum)
Established Patient Office Visit  Subjective:  Patient ID: Janice Small, female    DOB: 28-Jul-1927  Age: 88 y.o. MRN: [id number]  CC:  Chief Complaint  Patient presents with   Hypertension    Patient presents today to follow up on her HTN. She has been checking her BP at home on a regular.   Hemorrhoids    Patient states she has a hx of hemorrhoids and years ago she had them "tied off". She would like to discuss options for treatment as she is having issues with them currently    HPI Janice Small presents for follow-up of hypertension.  She has been checking her blood pressure at home and brings her blood pressure monitoring log with her today.  Blood pressures at home appear to be well controlled.  We will scanned record into chart.  She denies any current concerns with headache, chest pain, shortness of breath, palpitations, lower extremity edema, weakness, or dizziness.  She reports that her blood pressure is always been elevated when she comes into the doctor's office but is typically well controlled at home.   She does tell me she has a history of internal hemorrhoids that started after the birth of her second child.  She has previously had these "tied off".  She tells me that she has had increased and anal pressure and some bright red blood intermittently with bowel movements and feels that the hemorrhoids are exacerbated again.  She would like evaluation and recommendations that are nonsurgical to help with management of this.  Past Medical History:  Diagnosis Date   BREAST BIOPSY, HX OF 11/27/2006   COLONIC POLYPS, HX OF 11/27/2006   HEMORRHOIDS 01/17/2010   HYPERTENSION 11/27/2006   Hypertensive retinopathy    OU   HYPOTHYROIDISM 11/27/2006   Macular degeneration    Dry OU   MENOPAUSAL SYNDROME 11/27/2006    Past Surgical History:  Procedure Laterality Date   ABDOMINAL HYSTERECTOMY     APPENDECTOMY     BREAST EXCISIONAL BIOPSY Right 1987   BREAST SURGERY     bx    CATARACT EXTRACTION Bilateral    EYE SURGERY Bilateral    Cat Sx OU   TONSILLECTOMY      Family History  Problem Relation Age of Onset   Heart disease Father     Social History   Socioeconomic History   Marital status: Widowed    Spouse name: Not on file   Number of children: Not on file   Years of education: Not on file   Highest education level: Not on file  Occupational History   Not on file  Tobacco Use   Smoking status: Never   Smokeless tobacco: Never  Substance and Sexual Activity   Alcohol use: Yes    Comment: rarely   Drug use: No   Sexual activity: Not on file  Other Topics Concern   Not on file  Social History Narrative   Not on file   Social Determinants of Health   Financial Resource Strain: Not on file  Food Insecurity: Not on file  Transportation Needs: Not on file  Physical Activity: Not on file  Stress: Not on file  Social Connections: Not on file  Intimate Partner Violence: Not on file    Outpatient Medications Prior to Visit  Medication Sig Dispense Refill   amLODipine (NORVASC) 2.5 MG tablet Take 1 tablet (2.5 mg total) by mouth daily. 90 tablet 1   cloNIDine (CATAPRES) 0.1 MG tablet Take 1  tablet (0.1 mg total) by mouth 2 (two) times daily. 180 tablet 1   furosemide (LASIX) 20 MG tablet Take 1 tablet (20 mg total) by mouth daily. 90 tablet 1   levothyroxine (SYNTHROID) 75 MCG tablet Take 1 tablet (75 mcg total) by mouth daily. 90 tablet 1   lisinopril (ZESTRIL) 40 MG tablet Take 1 tablet (40 mg total) by mouth daily. 90 tablet 1   metroNIDAZOLE (METROGEL) 0.75 % gel Apply 1 application topically daily as needed. 45 g 1   No facility-administered medications prior to visit.    Allergies  Allergen Reactions   Codeine Phosphate     REACTION: unspecified    ROS Review of Systems All review of systems negative except what is listed in the HPI    Objective:    Physical Exam Vitals and nursing note reviewed.  Constitutional:       Appearance: Normal appearance.  HENT:     Head: Normocephalic.  Eyes:     Extraocular Movements: Extraocular movements intact.     Conjunctiva/sclera: Conjunctivae normal.     Pupils: Pupils are equal, round, and reactive to light.  Neck:     Vascular: No carotid bruit.  Cardiovascular:     Rate and Rhythm: Normal rate and regular rhythm.     Pulses: Normal pulses.     Heart sounds: Normal heart sounds.  Pulmonary:     Effort: Pulmonary effort is normal.     Breath sounds: Normal breath sounds.  Musculoskeletal:     Cervical back: Normal range of motion.     Right lower leg: No edema.     Left lower leg: No edema.  Skin:    General: Skin is warm and dry.     Capillary Refill: Capillary refill takes less than 2 seconds.  Neurological:     General: No focal deficit present.     Mental Status: She is alert and oriented to person, place, and time.  Psychiatric:        Mood and Affect: Mood normal.        Behavior: Behavior normal.        Thought Content: Thought content normal.        Judgment: Judgment normal.    BP (!) 206/110    Pulse 76    Ht 5' (1.524 m)    Wt 147 lb (66.7 kg)    SpO2 98%    BMI 28.71 kg/m  Wt Readings from Last 3 Encounters:  08/07/21 147 lb (66.7 kg)  08/01/21 146 lb 6.4 oz (66.4 kg)  07/09/21 145 lb (65.8 kg)     Health Maintenance Due  Topic Date Due   Zoster Vaccines- Shingrix (1 of 2) Never done   COVID-19 Vaccine (5 - Booster for Pfizer series) 03/10/2021   TETANUS/TDAP  06/15/2021    There are no preventive care reminders to display for this patient.  Lab Results  Component Value Date   TSH 2.74 08/01/2021   Lab Results  Component Value Date   WBC 4.9 12/03/2018   HGB 14.2 12/03/2018   HCT 41.6 12/03/2018   MCV 97.1 12/03/2018   PLT 141.0 (L) 12/03/2018   Lab Results  Component Value Date   NA 140 08/01/2021   K 4.5 08/01/2021   CO2 29 08/01/2021   GLUCOSE 91 08/01/2021   BUN 22 08/01/2021   CREATININE 0.92 08/01/2021    BILITOT 0.6 03/29/2020   ALKPHOS 68 12/03/2018   AST 7 (L) 03/29/2020  ALT 11 03/29/2020   PROT 6.4 03/29/2020   ALBUMIN 4.7 12/03/2018   CALCIUM 10.4 08/01/2021   GFR 54.36 (L) 08/01/2021   Lab Results  Component Value Date   CHOL 202 (H) 12/03/2018   Lab Results  Component Value Date   HDL 97.60 12/03/2018   Lab Results  Component Value Date   LDLCALC 86 12/03/2018   Lab Results  Component Value Date   TRIG 93.0 12/03/2018   Lab Results  Component Value Date   CHOLHDL 2 12/03/2018   No results found for: HGBA1C    Assessment & Plan:   Problem List Items Addressed This Visit     Hypothyroidism    Currently followed by endocrinology. Symptoms are well controlled.  Review of most recent labs today. Will continue to monitor.      Essential hypertension    Blood pressure initially significantly elevated in the office today at 206//110.  Repeat evaluation prior to ending of visit showed blood pressure had reduced substantially down to 142/86. She is not currently having any concerning signs or symptoms present. Review of at home blood pressure log with patient and patient's son today shows that blood pressures are very well controlled at home. Discussed with patient and her son that further increase in blood pressure medication may result in hypotensive episodes at home and she is most likely experiencing symptoms of whitecoat hypertension with the elevations in the office. Discussed warning signs that would need immediate evaluation and recommend continuation of monitoring 2-3 times a week while at home.  Patient is aware to notify the office of her blood pressure readings are above 155/90 and do not come down with rest. Will obtain labs at next visit.  Follow-up in 3 months.      Relevant Medications   lisinopril (ZESTRIL) 40 MG tablet   Internal hemorrhoids    Patient endorses a history of internal hemorrhoids which have previously been nonsurgically  managed. She does report in the past several months she has noticed an increase in hemorrhoid pain and pressure with intermittent bright red blood after using the bathroom.  Her symptoms are consistent with her previous experience with exacerbation of hemorrhoids.  She does not wish to have surgical intervention at this time but is interested in evaluation for other medical management of the condition. It does not appear that she has had any recent evaluation by GI.  We will send referral today to previous GI provider for evaluation and recommendations.  No alarm symptoms present today.      Relevant Medications   lisinopril (ZESTRIL) 40 MG tablet   Other Relevant Orders   Ambulatory referral to Gastroenterology   Osteoporosis    DEXA scan ordered today for diagnosis of osteoporosis screening. Order was placed by endocrinology however patient prefers to have this completed in this building.  We will send order for her for completion.  No concerns at this time.      Relevant Orders   DG Bone Density   Other Visit Diagnoses     Rosacea, unspecified    -  Primary   Relevant Medications   metroNIDAZOLE (METROGEL) 0.75 % gel       Meds ordered this encounter  Medications   lisinopril (ZESTRIL) 40 MG tablet    Sig: Take 1 tablet (40 mg total) by mouth daily.    Dispense:  90 tablet    Refill:  3   metroNIDAZOLE (METROGEL) 0.75 % gel    Sig: Apply 1  application topically daily as needed.    Dispense:  45 g    Refill:  3    Follow-up: Return in about 3 months (around 11/05/2021) for BP and labs.    [name], NP

## 2021-08-07 NOTE — Assessment & Plan Note (Signed)
Blood pressure initially significantly elevated in the office today at 206//110.  Repeat evaluation prior to ending of visit showed blood pressure had reduced substantially down to 142/86. She is not currently having any concerning signs or symptoms present. Review of at home blood pressure log with patient and patient's son today shows that blood pressures are very well controlled at home. Discussed with patient and her son that further increase in blood pressure medication may result in hypotensive episodes at home and she is most likely experiencing symptoms of whitecoat hypertension with the elevations in the office. Discussed warning signs that would need immediate evaluation and recommend continuation of monitoring 2-3 times a week while at home.  Patient is aware to notify the office of her blood pressure readings are above 155/90 and do not come down with rest. Will obtain labs at next visit.  Follow-up in 3 months.

## 2021-08-07 NOTE — Assessment & Plan Note (Signed)
Currently followed by endocrinology. Symptoms are well controlled.  Review of most recent labs today. Will continue to monitor.

## 2021-08-07 NOTE — Assessment & Plan Note (Signed)
Patient endorses a history of internal hemorrhoids which have previously been nonsurgically managed. She does report in the past several months she has noticed an increase in hemorrhoid pain and pressure with intermittent bright red blood after using the bathroom.  Her symptoms are consistent with her previous experience with exacerbation of hemorrhoids.  She does not wish to have surgical intervention at this time but is interested in evaluation for other medical management of the condition. It does not appear that she has had any recent evaluation by GI.  We will send referral today to previous GI provider for evaluation and recommendations.  No alarm symptoms present today.

## 2021-08-07 NOTE — Assessment & Plan Note (Signed)
DEXA scan ordered today for diagnosis of osteoporosis screening. Order was placed by endocrinology however patient prefers to have this completed in this building.  We will send order for her for completion.  No concerns at this time.

## 2021-08-08 NOTE — Telephone Encounter (Signed)
Prior auth approved via [facility] provider portal PA# [id number] Valid 1/26//26/24

## 2021-08-08 NOTE — Telephone Encounter (Signed)
Pt ready for scheduling on or after 09/02/21  Out-of-pocket cost due at time of visit: $301  Primary: [employer] Prolia co-insurance: 20% (approximately $276) Admin fee co-insurance: 20% (approximately $25)  Secondary: n/a Prolia co-insurance:  Admin fee co-insurance:   Deductible: does not apply  Prior Auth: APPROVED PA# [id number] Valid: valid 08/08/21-08/08/22  ** This summary of benefits is an estimation of the patient's out-of-pocket cost. Exact cost may vary based on individual plan coverage.

## 2021-08-20 NOTE — Progress Notes (Signed)
[facility] Note  08/22/2021     CHIEF COMPLAINT Patient presents for Retina Follow Up    HISTORY OF PRESENT ILLNESS: Janice Small is a 88 y.o. female who presents to the clinic today for:  HPI     Retina Follow Up   Patient presents with  CRVO/BRVO.  In left eye.  This started 4 weeks ago.  I, the attending physician,  performed the HPI with the patient and updated documentation appropriately.        Comments   Patient here for 4 weeks retina follow up for BRVO OS. Patient states vision about the same. Has trouble reading the newspaper.      Last edited by Bernarda Caffey, MD on 08/22/2021 11:11 AM.    Patient hasn't noticed any changes in vision.   Referring physician: [name], NP [place],  [place] [address]  HISTORICAL INFORMATION:   Selected notes from the MEDICAL RECORD NUMBER Referred by Dr. Jerline Pain for eval of BRVO OS LEE: 10.12.2021 Ocular Hx- Glc suspect   CURRENT MEDICATIONS: Current Outpatient Medications (Ophthalmic Drugs)  Medication Sig   Bromfenac Sodium (PROLENSA) 0.07 % SOLN Place 1 drop into the left eye 4 (four) times daily.   prednisoLONE acetate (PRED FORTE) 1 % ophthalmic suspension Place 1 drop into the left eye 4 (four) times daily.   No current facility-administered medications for this visit. (Ophthalmic Drugs)   Current Outpatient Medications (Other)  Medication Sig   amLODipine (NORVASC) 2.5 MG tablet Take 1 tablet (2.5 mg total) by mouth daily.   cloNIDine (CATAPRES) 0.1 MG tablet Take 1 tablet (0.1 mg total) by mouth 2 (two) times daily.   furosemide (LASIX) 20 MG tablet Take 1 tablet (20 mg total) by mouth daily.   levothyroxine (SYNTHROID) 75 MCG tablet Take 1 tablet (75 mcg total) by mouth daily.   lisinopril (ZESTRIL) 40 MG tablet Take 1 tablet (40 mg total) by mouth daily.   metroNIDAZOLE (METROGEL) 0.75 % gel Apply 1 application topically daily as needed.   No  current facility-administered medications for this visit. (Other)   REVIEW OF SYSTEMS: ROS   Positive for: Gastrointestinal, Musculoskeletal, Eyes Negative for: Constitutional, Neurological, Skin, Genitourinary, HENT, Endocrine, Cardiovascular, Respiratory, Psychiatric, Allergic/Imm, Heme/Lymph Last edited by Theodore Demark, COA on 08/22/2021  9:01 AM.     ALLERGIES Allergies  Allergen Reactions   Codeine Phosphate     REACTION: unspecified   PAST MEDICAL HISTORY Past Medical History:  Diagnosis Date   BREAST BIOPSY, HX OF 11/27/2006   COLONIC POLYPS, HX OF 11/27/2006   HEMORRHOIDS 01/17/2010   HYPERTENSION 11/27/2006   Hypertensive retinopathy    OU   HYPOTHYROIDISM 11/27/2006   Macular degeneration    Dry OU   MENOPAUSAL SYNDROME 11/27/2006   Past Surgical History:  Procedure Laterality Date   ABDOMINAL HYSTERECTOMY     APPENDECTOMY     BREAST EXCISIONAL BIOPSY Right 1987   BREAST SURGERY     bx   CATARACT EXTRACTION Bilateral    EYE SURGERY Bilateral    Cat Sx OU   TONSILLECTOMY     FAMILY HISTORY Family History  Problem Relation Age of Onset   Heart disease Father    SOCIAL HISTORY Social History   Tobacco Use   Smoking status: Never   Smokeless tobacco: Never  Vaping Use   Vaping Use: Never used  Substance Use Topics   Alcohol use: Yes  Comment: rarely   Drug use: No       OPHTHALMIC EXAM:  Base Eye Exam     Visual Acuity (Snellen - Linear)       Right Left   Dist cc 20/20 -1 20/30 +2   Dist ph cc  NI    Correction: Glasses         Tonometry (Tonopen, 8:59 AM)       Right Left   Pressure 17 17         Pupils       Dark Light Shape React APD   Right 3 2 Round Minimal None   Left 3 2 Round Minimal None         Visual Fields (Counting fingers)       Left Right    Full Full         Extraocular Movement       Right Left    Full, Ortho Full, Ortho         Neuro/Psych     Oriented x3: Yes   Mood/Affect: Normal          Dilation     Both eyes: 1.0% Mydriacyl, 2.5% Phenylephrine @ 8:59 AM           Slit Lamp and Fundus Exam     Slit Lamp Exam       Right Left   Lids/Lashes Dermatochalasis - upper lid Dermatochalasis - upper lid, mild Meibomian gland dysfunction   Conjunctiva/Sclera White and quiet White and quiet   Cornea 1-2+ fine Punctate epithelial erosions, well healed temporal cataract wounds, arcus, early band K nasal and temporal 2-3+ inferior Punctate epithelial erosions, well healed temporal cataract wounds, arcus   Anterior Chamber Deep and quiet Deep and quiet   Iris Round and dilated Round and dilated   Lens 3 piece PC IOL in good position with open PC 3 piece PC IOL in good position, 1+PCO (non-central)   Anterior Vitreous Vitreous syneresis, Posterior vitreous detachment Vitreous syneresis, Posterior vitreous detachment         Fundus Exam       Right Left   Disc Pink and Sharp, Compact mild Pallor, Sharp rim, temporal PPA, Compact   C/D Ratio 0.3 0.2   Macula Flat, Blunted foveal reflex, mild Drusen, RPE mottling and clumping, no heme or edema Blunted foveal reflex, focal IRH and exudates temporal macula -- persistent, cystic changes and edema -- persistent, +laser changes, Drusen   Vessels attenuated, Tortuous Tortuous, severe attenuation of vessels superior macula   Periphery Attached, No heme  Attached, mild reticular degeneration           Refraction     Wearing Rx       Sphere Cylinder Axis Add   Right -1.00 +0.50 027 +2.50   Left -0.75 +0.75 083 +2.50    Type: progressive           IMAGING AND PROCEDURES  Imaging and Procedures for 08/22/2021  OCT, Retina - OU - Both Eyes       Right Eye Quality was good. Central Foveal Thickness: 270. Progression has been stable. Findings include normal foveal contour, no IRF, no SRF, retinal drusen .   Left Eye Quality was good. Central Foveal Thickness: 273. Progression has been stable. Findings  include normal foveal contour, intraretinal fluid, intraretinal hyper-reflective material, retinal drusen , no SRF (Persistent IRF/edema temporal macula ).   Notes *Images captured and stored on drive  Diagnosis /  Impression:  OD: non-exu ARMD w/ fine drusen OS: BRVO -- Persistent IRF/edema temporal macula   Clinical management:  See below  Abbreviations: NFP - Normal foveal profile. CME - cystoid macular edema. PED - pigment epithelial detachment. IRF - intraretinal fluid. SRF - subretinal fluid. EZ - ellipsoid zone. ERM - epiretinal membrane. ORA - outer retinal atrophy. ORT - outer retinal tubulation. SRHM - subretinal hyper-reflective material. IRHM - intraretinal hyper-reflective material      Intravitreal Injection, Pharmacologic Agent - OS - Left Eye       Time Out 08/22/2021. 9:39 AM. Confirmed correct patient, procedure, site, and patient consented.   Anesthesia Topical anesthesia was used. Anesthetic medications included Proparacaine 0.5%, Lidocaine 2%.   Procedure Preparation included 5% betadine to ocular surface, eyelid speculum. A (32g) needle was used.   Injection: 2 mg aflibercept 2 MG/0.05ML   Route: Intravitreal, Site: Left Eye   NDC: [id number], Lot: [id number], Expiration date: 06/13/2022, Waste: 0.05 mL   Post-op Post injection exam found visual acuity of at least counting fingers. The patient tolerated the procedure well. There were no complications. The patient received written and verbal post procedure care education. Post injection medications were not given.             ASSESSMENT/PLAN:    ICD-10-CM   1. Branch retinal vein occlusion of left eye with macular edema  H34.8320 OCT, Retina - OU - Both Eyes    Intravitreal Injection, Pharmacologic Agent - OS - Left Eye    aflibercept (EYLEA) SOLN 2 mg    2. Essential hypertension  I10     3. Hypertensive retinopathy of both eyes  H35.033     4. Early dry stage nonexudative age-related macular  degeneration of both eyes  H35.3131     5. Pseudophakia, both eyes  Z96.1     6. Glaucoma suspect of both eyes  H40.003      1. BRVO w/ CME OS             - S/P IVA OS #1 (10.18.21), #2 (11.15.21), #3 (12.13.21), #4 (01.10.22), #5 (02.07.22), #6 (03.07.22), #7 (04.04.22), #8 (5.2.22) -- IVA resistance  - S/P IVE OS #1 (05.31.22) -- sample, #2 (06.28.22), #3 (07.26.22), #4 (08.23.22)             - S/P focal laser OS (02.16.22) - BCVA 20/30 OS - OCT shows Persistent IRF/edema temporal macula -- will start PF and Prolensa QID OS for possible CME component - recommend IVE #5 today OS, 02.09.23 - pt wishes to proceed - RBA of procedure discussed, questions answered - informed consent obtained and signed - see procedure note - Avastin informed consent obtained and signed, 10.18.21 (OS) - Eylea informed consent obtained and signed, 05.31.22 - Eylea4U paperwork completed 05.31.22 - F/U 5-6 weeks, DFE, OCT, possible injection   2,3. Hypertensive retinopathy OU - discussed importance of tight BP control - monitor  4. Age related macular degeneration, non-exudative, both eyes  - The incidence, anatomy, and pathology of dry AMD, risk of progression, and the AREDS and AREDS 2 study including smoking risks discussed with patient.  - Recommend amsler grid monitoring  5. Pseudophakia OU  - s/p CE/IOL  - IOL in good position, doing well  - monitor  6. Glaucoma Suspect  - IOP 17 OU  - under the expert care of Dr. Jerline Pain  Ophthalmic Meds Ordered this visit:  Meds ordered this encounter  Medications   prednisoLONE acetate (PRED FORTE) 1 % ophthalmic  suspension    Sig: Place 1 drop into the left eye 4 (four) times daily.    Dispense:  15 mL    Refill:  0   Bromfenac Sodium (PROLENSA) 0.07 % SOLN    Sig: Place 1 drop into the left eye 4 (four) times daily.    Dispense:  3 mL    Refill:  3   aflibercept (EYLEA) SOLN 2 mg     Return for f/u 5-6 weeks, BRVO OS, DFE, OCT.  There are no  Patient Instructions on file for this visit.  This document serves as a record of services personally performed by Gardiner Sleeper, MD, PhD. It was created on their behalf by San Jetty. Owens Shark, OA an ophthalmic technician. The creation of this record is the provider's dictation and/or activities during the visit.    Electronically signed by: San Jetty. Owens Shark, [place] 02.07.2023 11:12 AM  Gardiner Sleeper, M.D., Ph.D. Diseases & Surgery of the Retina and Vitreous [facility]  I have reviewed the above documentation for accuracy and completeness, and I agree with the above. Gardiner Sleeper, M.D., Ph.D. 08/22/21 11:14 AM   Abbreviations: M myopia (nearsighted); A astigmatism; H hyperopia (farsighted); P presbyopia; Mrx spectacle prescription;  CTL contact lenses; OD right eye; OS left eye; OU both eyes  XT exotropia; ET esotropia; PEK punctate epithelial keratitis; PEE punctate epithelial erosions; DES dry eye syndrome; MGD meibomian gland dysfunction; ATs artificial tears; PFAT's preservative free artificial tears; [place] nuclear sclerotic cataract; PSC posterior subcapsular cataract; ERM epi-retinal membrane; PVD posterior vitreous detachment; RD retinal detachment; DM diabetes mellitus; DR diabetic retinopathy; NPDR non-proliferative diabetic retinopathy; PDR proliferative diabetic retinopathy; CSME clinically significant macular edema; DME diabetic macular edema; dbh dot blot hemorrhages; CWS cotton wool spot; POAG primary open angle glaucoma; C/D cup-to-disc ratio; HVF humphrey visual field; GVF goldmann visual field; OCT optical coherence tomography; IOP intraocular pressure; BRVO Branch retinal vein occlusion; CRVO central retinal vein occlusion; CRAO central retinal artery occlusion; BRAO branch retinal artery occlusion; RT retinal tear; SB scleral buckle; PPV pars plana vitrectomy; VH Vitreous hemorrhage; PRP panretinal laser photocoagulation; IVK intravitreal kenalog; VMT  vitreomacular traction; MH Macular hole;  NVD neovascularization of the disc; NVE neovascularization elsewhere; AREDS age related eye disease study; ARMD age related macular degeneration; POAG primary open angle glaucoma; EBMD epithelial/anterior basement membrane dystrophy; ACIOL anterior chamber intraocular lens; IOL intraocular lens; PCIOL posterior chamber intraocular lens; Phaco/IOL phacoemulsification with intraocular lens placement; [place] photorefractive keratectomy; LASIK laser assisted in situ keratomileusis; HTN hypertension; DM diabetes mellitus; COPD chronic obstructive pulmonary disease

## 2021-08-22 ENCOUNTER — Ambulatory Visit (INDEPENDENT_AMBULATORY_CARE_PROVIDER_SITE_OTHER): Payer: Medicare Other | Admitting: Ophthalmology

## 2021-08-22 ENCOUNTER — Encounter (INDEPENDENT_AMBULATORY_CARE_PROVIDER_SITE_OTHER): Payer: Self-pay | Admitting: Ophthalmology

## 2021-08-22 ENCOUNTER — Encounter: Payer: Self-pay | Admitting: Internal Medicine

## 2021-08-22 ENCOUNTER — Other Ambulatory Visit: Payer: Self-pay

## 2021-08-22 DIAGNOSIS — H40003 Preglaucoma, unspecified, bilateral: Secondary | ICD-10-CM

## 2021-08-22 DIAGNOSIS — H35033 Hypertensive retinopathy, bilateral: Secondary | ICD-10-CM

## 2021-08-22 DIAGNOSIS — Z961 Presence of intraocular lens: Secondary | ICD-10-CM

## 2021-08-22 DIAGNOSIS — H34832 Tributary (branch) retinal vein occlusion, left eye, with macular edema: Secondary | ICD-10-CM | POA: Diagnosis not present

## 2021-08-22 DIAGNOSIS — H353131 Nonexudative age-related macular degeneration, bilateral, early dry stage: Secondary | ICD-10-CM | POA: Diagnosis not present

## 2021-08-22 DIAGNOSIS — I10 Essential (primary) hypertension: Secondary | ICD-10-CM | POA: Diagnosis not present

## 2021-08-22 MED ORDER — PROLENSA 0.07 % OP SOLN: 1.0000 [drp] | Freq: Four times a day (QID) | OPHTHALMIC | 3 refills | Status: DC

## 2021-08-22 MED ORDER — PREDNISOLONE ACETATE 1 % OP SUSP: 1.0000 [drp] | Freq: Four times a day (QID) | OPHTHALMIC | 0 refills | Status: DC

## 2021-08-22 MED ORDER — AFLIBERCEPT 2MG/0.05ML IZ SOLN FOR KALEIDOSCOPE
2.0000 mg | INTRAVITREAL | Status: AC | PRN
  Administered 2021-08-22: 2 mg via INTRAVITREAL

## 2021-09-04 ENCOUNTER — Ambulatory Visit (HOSPITAL_BASED_OUTPATIENT_CLINIC_OR_DEPARTMENT_OTHER)
Admission: RE | Admit: 2021-09-04 | Discharge: 2021-09-04 | Disposition: A | Discharge status: Home / Self Care | Payer: Medicare Other | Source: Ambulatory Visit | Attending: Nurse Practitioner | Admitting: Nurse Practitioner

## 2021-09-04 ENCOUNTER — Other Ambulatory Visit: Payer: Self-pay

## 2021-09-04 DIAGNOSIS — M810 Age-related osteoporosis without current pathological fracture: Secondary | ICD-10-CM | POA: Diagnosis not present

## 2021-09-11 NOTE — Telephone Encounter (Signed)
Per visit note 08/01/21: ? ?  ?4. OP ?- likely postmenopausal/age-related + due to hyperparathyroidism ?-Reviewed her bone density results from 2019: Significant osteoporosis at the ultra distal radius level, a surrogate marker for trabecular bone -more accurate than the spine BMD due to calcium accumulation in the setting of osteoarthritis ?-We started Prolia in 12/2018 and she had 5 injections so far, last on 03/01/2021 ?-She tolerates Prolia well, without thigh/hip pain, or jaw pain ?-We discussed that we can continue Prolia for up to 10 years and possibly even more if needed ?-We discussed that Prolia can also help with high calcium levels ?-At last visit I ordered another bone density scan but she did not have this yet  - again advised to schedule ?-I will see her back in 1 year ?

## 2021-09-13 NOTE — Telephone Encounter (Signed)
Pt ready for scheduling on or after 09/02/21 ?  ?Out-of-pocket cost due at time of visit: $301 ?  ?Primary: [employer] Medicare ?Prolia co-insurance: 20% (approximately $276) ?Admin fee co-insurance: 20% (approximately $25) ?  ?Secondary: n/a ?Prolia co-insurance:  ?Admin fee co-insurance:  ?  ?Deductible: does not apply ?  ?Prior Auth: APPROVED ?PA# [id number] ?Valid: valid 08/08/21-08/08/22 ?  ?** This summary of benefits is an estimation of the patient's out-of-pocket cost. Exact cost may vary based on individual plan coverage.  ?

## 2021-09-17 NOTE — Progress Notes (Shared)
[facility] Note  09/26/2021     CHIEF COMPLAINT Patient presents for No chief complaint on file.    HISTORY OF PRESENT ILLNESS: Janice Small is a 88 y.o. female who presents to the clinic today for:   Patient hasn't noticed any changes in vision.     Referring physician: [name], NP [place],  [place] [address]  HISTORICAL INFORMATION:   Selected notes from the MEDICAL RECORD NUMBER Referred by Dr. Jerline Pain for eval of BRVO OS LEE: 10.12.2021 Ocular Hx- Glc suspect   CURRENT MEDICATIONS: Current Outpatient Medications (Ophthalmic Drugs)  Medication Sig   Bromfenac Sodium (PROLENSA) 0.07 % SOLN Place 1 drop into the left eye 4 (four) times daily.   prednisoLONE acetate (PRED FORTE) 1 % ophthalmic suspension Place 1 drop into the left eye 4 (four) times daily.   No current facility-administered medications for this visit. (Ophthalmic Drugs)   Current Outpatient Medications (Other)  Medication Sig   amLODipine (NORVASC) 2.5 MG tablet Take 1 tablet (2.5 mg total) by mouth daily.   cloNIDine (CATAPRES) 0.1 MG tablet Take 1 tablet (0.1 mg total) by mouth 2 (two) times daily.   furosemide (LASIX) 20 MG tablet Take 1 tablet (20 mg total) by mouth daily.   levothyroxine (SYNTHROID) 75 MCG tablet Take 1 tablet (75 mcg total) by mouth daily.   lisinopril (ZESTRIL) 40 MG tablet Take 1 tablet (40 mg total) by mouth daily.   metroNIDAZOLE (METROGEL) 0.75 % gel Apply 1 application topically daily as needed.   No current facility-administered medications for this visit. (Other)   REVIEW OF SYSTEMS:   ALLERGIES Allergies  Allergen Reactions   Codeine Phosphate     REACTION: unspecified    PAST MEDICAL HISTORY Past Medical History:  Diagnosis Date   BREAST BIOPSY, HX OF 11/27/2006   COLONIC POLYPS, HX OF 11/27/2006   HEMORRHOIDS 01/17/2010   HYPERTENSION 11/27/2006   Hypertensive retinopathy    OU    HYPOTHYROIDISM 11/27/2006   Macular degeneration    Dry OU   MENOPAUSAL SYNDROME 11/27/2006   Past Surgical History:  Procedure Laterality Date   ABDOMINAL HYSTERECTOMY     APPENDECTOMY     BREAST EXCISIONAL BIOPSY Right 1987   BREAST SURGERY     bx   CATARACT EXTRACTION Bilateral    EYE SURGERY Bilateral    Cat Sx OU   TONSILLECTOMY      FAMILY HISTORY Family History  Problem Relation Age of Onset   Heart disease Father     SOCIAL HISTORY Social History   Tobacco Use   Smoking status: Never   Smokeless tobacco: Never  Vaping Use   Vaping Use: Never used  Substance Use Topics   Alcohol use: Yes    Comment: rarely   Drug use: No         OPHTHALMIC EXAM:  Not recorded     IMAGING AND PROCEDURES  Imaging and Procedures for 09/26/2021          ASSESSMENT/PLAN:    ICD-10-CM   1. Branch retinal vein occlusion of left eye with macular edema  H34.8320     2. Essential hypertension  I10     3. Hypertensive retinopathy of both eyes  H35.033     4. Early dry stage nonexudative age-related macular degeneration of both eyes  H35.3131     5. Pseudophakia, both eyes  Z96.1     6. Glaucoma suspect of  both eyes  H40.003      1. BRVO w/ CME OS             - S/P IVA OS #1 (10.18.21), #2 (11.15.21), #3 (12.13.21), #4 (01.10.22), #5 (02.07.22), #6 (03.07.22), #7 (04.04.22), #8 (5.2.22) -- IVA resistance  - S/P IVE OS #1 (05.31.22) -- sample, #2 (06.28.22), #3 (07.26.22)             - S/P focal laser OS (02.16.22) - BCVA stable at 20/40 - OCT shows persistent IRF temporal macula - recommend IVE #4 today OS, 08.23.22 - pt wishes to proceed - RBA of procedure discussed, questions answered - informed consent obtained and signed - see procedure note - Avastin informed consent obtained and signed, 10.18.21 (OS) - Eylea informed consent obtained and signed, 05.31.22 - Eylea4U paperwork completed 05.31.22 - F/U 4 weeks, DFE, OCT, possible injection   2,3.  Hypertensive retinopathy OU - discussed importance of tight BP control - monitor  4. Age related macular degeneration, non-exudative, both eyes  - The incidence, anatomy, and pathology of dry AMD, risk of progression, and the AREDS and AREDS 2 study including smoking risks discussed with patient.  - Recommend amsler grid monitoring  5. Pseudophakia OU  - s/p CE/IOL  - IOL in good position, doing well  - monitor  6. Glaucoma Suspect  - IOP 16,16  - under the expert care of Dr. Jerline Pain  Ophthalmic Meds Ordered this visit:  No orders of the defined types were placed in this encounter.      No follow-ups on file.  There are no Patient Instructions on file for this visit.  This document serves as a record of services personally performed by Gardiner Sleeper, MD, PhD. It was created on their behalf by San Jetty. Owens Shark, OA an ophthalmic technician. The creation of this record is the provider's dictation and/or activities during the visit.    Electronically signed by: San Jetty. Owens Shark, [place] 03.07.2023 12:52 PM    Gardiner Sleeper, M.D., Ph.D. Diseases & Surgery of the Retina and Vitreous [facility]     Abbreviations: M myopia (nearsighted); A astigmatism; H hyperopia (farsighted); P presbyopia; Mrx spectacle prescription;  CTL contact lenses; OD right eye; OS left eye; OU both eyes  XT exotropia; ET esotropia; PEK punctate epithelial keratitis; PEE punctate epithelial erosions; DES dry eye syndrome; MGD meibomian gland dysfunction; ATs artificial tears; PFAT's preservative free artificial tears; [place] nuclear sclerotic cataract; PSC posterior subcapsular cataract; ERM epi-retinal membrane; PVD posterior vitreous detachment; RD retinal detachment; DM diabetes mellitus; DR diabetic retinopathy; NPDR non-proliferative diabetic retinopathy; PDR proliferative diabetic retinopathy; CSME clinically significant macular edema; DME diabetic macular edema; dbh dot blot  hemorrhages; CWS cotton wool spot; POAG primary open angle glaucoma; C/D cup-to-disc ratio; HVF humphrey visual field; GVF goldmann visual field; OCT optical coherence tomography; IOP intraocular pressure; BRVO Branch retinal vein occlusion; CRVO central retinal vein occlusion; CRAO central retinal artery occlusion; BRAO branch retinal artery occlusion; RT retinal tear; SB scleral buckle; PPV pars plana vitrectomy; VH Vitreous hemorrhage; PRP panretinal laser photocoagulation; IVK intravitreal kenalog; VMT vitreomacular traction; MH Macular hole;  NVD neovascularization of the disc; NVE neovascularization elsewhere; AREDS age related eye disease study; ARMD age related macular degeneration; POAG primary open angle glaucoma; EBMD epithelial/anterior basement membrane dystrophy; ACIOL anterior chamber intraocular lens; IOL intraocular lens; PCIOL posterior chamber intraocular lens; Phaco/IOL phacoemulsification with intraocular lens placement; PRK photorefractive keratectomy; LASIK laser assisted in situ keratomileusis; HTN  hypertension; DM diabetes mellitus; COPD chronic obstructive pulmonary disease

## 2021-09-26 ENCOUNTER — Ambulatory Visit (INDEPENDENT_AMBULATORY_CARE_PROVIDER_SITE_OTHER): Payer: Medicare Other | Admitting: Ophthalmology

## 2021-09-26 ENCOUNTER — Encounter (INDEPENDENT_AMBULATORY_CARE_PROVIDER_SITE_OTHER): Payer: Self-pay | Admitting: Ophthalmology

## 2021-09-26 ENCOUNTER — Other Ambulatory Visit: Payer: Self-pay

## 2021-09-26 DIAGNOSIS — H35033 Hypertensive retinopathy, bilateral: Secondary | ICD-10-CM

## 2021-09-26 DIAGNOSIS — H353131 Nonexudative age-related macular degeneration, bilateral, early dry stage: Secondary | ICD-10-CM

## 2021-09-26 DIAGNOSIS — I10 Essential (primary) hypertension: Secondary | ICD-10-CM

## 2021-09-26 DIAGNOSIS — H34832 Tributary (branch) retinal vein occlusion, left eye, with macular edema: Secondary | ICD-10-CM | POA: Diagnosis not present

## 2021-09-26 DIAGNOSIS — Z961 Presence of intraocular lens: Secondary | ICD-10-CM

## 2021-09-26 DIAGNOSIS — H40003 Preglaucoma, unspecified, bilateral: Secondary | ICD-10-CM

## 2021-09-26 MED ORDER — AFLIBERCEPT 2MG/0.05ML IZ SOLN FOR KALEIDOSCOPE
2.0000 mg | INTRAVITREAL | Status: AC | PRN
  Administered 2021-09-26: 2 mg via INTRAVITREAL

## 2021-09-26 NOTE — Telephone Encounter (Signed)
Appt 10/02/21 ?

## 2021-10-02 ENCOUNTER — Ambulatory Visit: Payer: Medicare Other

## 2021-10-02 ENCOUNTER — Other Ambulatory Visit: Payer: Self-pay

## 2021-10-02 DIAGNOSIS — M818 Other osteoporosis without current pathological fracture: Secondary | ICD-10-CM

## 2021-10-02 MED ORDER — DENOSUMAB 60 MG/ML ~~LOC~~ SOSY
60.0000 mg | PREFILLED_SYRINGE | Freq: Once | SUBCUTANEOUS | Status: AC
  Administered 2021-10-02: 60 mg via SUBCUTANEOUS

## 2021-10-02 NOTE — Progress Notes (Signed)
Patient verbally confirmed name, date of birth, and correct medication to be administered. Prolia injection administered and pt tolerated well.  

## 2021-10-03 ENCOUNTER — Encounter: Payer: Self-pay | Admitting: Internal Medicine

## 2021-10-03 ENCOUNTER — Ambulatory Visit (INDEPENDENT_AMBULATORY_CARE_PROVIDER_SITE_OTHER): Payer: Medicare Other | Admitting: Internal Medicine

## 2021-10-03 VITALS — BP 140/70 | HR 88 | Ht 60.0 in | Wt 150.0 lb

## 2021-10-03 DIAGNOSIS — K644 Residual hemorrhoidal skin tags: Secondary | ICD-10-CM

## 2021-10-03 DIAGNOSIS — K648 Other hemorrhoids: Secondary | ICD-10-CM

## 2021-10-03 NOTE — Patient Instructions (Addendum)
HEMORRHOID BANDING PROCEDURE  ? ? FOLLOW-UP CARE ? ? ?The procedure you have had should have been relatively painless since the banding of the area involved does not have nerve endings and there is no pain sensation.  The rubber band cuts off the blood supply to the hemorrhoid and the band may fall off as soon as 48 hours after the banding (the band may occasionally be seen in the toilet bowl following a bowel movement). You may notice a temporary feeling of fullness in the rectum which should respond adequately to plain Tylenol? or Motrin?. ? ?Following the banding, avoid strenuous exercise that evening and resume full activity the next day.  A sitz bath (soaking in a warm tub) or bidet is soothing, and can be useful for cleansing the area after bowel movements.   ? ? ?To avoid constipation, take two tablespoons of natural wheat bran, natural oat bran, flax, Benefiber? or any over the counter fiber supplement and increase your water intake to 7-8 glasses daily.   ? ?Unless you have been prescribed anorectal medication, do not put anything inside your rectum for two weeks: No suppositories, enemas, fingers, etc. ? ?Occasionally, you may have more bleeding than usual after the banding procedure.  This is often from the untreated hemorrhoids rather than the treated one.  Don?t be concerned if there is a tablespoon or so of blood.  If there is more blood than this, lie flat with your bottom higher than your head and apply an ice pack to the area. If the bleeding does not stop within a half an hour or if you feel faint, call our office at [phone] or go to the emergency room. ? ?Problems are not common; however, if there is a substantial amount of bleeding, severe pain, chills, fever or difficulty passing urine (very rare) or other problems, you should call us at [phone] or report to the nearest emergency room. ? ?Do not stay seated continuously for more than 2-3 hours for a day or two after the procedure.   Tighten your buttock muscles 10-15 times every two hours and take 10-15 deep breaths every 1-2 hours.  Do not spend more than a few minutes on the toilet if you cannot empty your bowel; instead re-visit the toilet at a later time. ? ?Please call us back after you check your schedule to set up an appointment for late May.  ? ?I appreciate the opportunity to care for you. ?Silvano Rusk, MD, [facility] [place] [facility] ?

## 2021-10-03 NOTE — Progress Notes (Signed)
? ?  Janice Small 88 y.o. 06-18-1928 [id number] ? ?Assessment & Plan:  ? ?Encounter Diagnosis  ?Name Primary?  ? Internal and external bleeding hemorrhoids Yes  ? ? ?I banded the right anterior and left lateral hemorrhoids today.  She tolerated this well.  She will return in about 2 months for reassessment.  She has a right anterior external component that may continue to bleed. ? ? ?CC: Early, Coralee Pesa, NP ? ?Subjective:  ? ?Chief Complaint: Hemorrhoids ? ?HPI ?88 year old white woman who has had chronic hemorrhoids with bleeding.  She had banding more than 10 years ago she thinks it might of been 20 years ago, with success.  She is interesting in this treatment again if possible.  She does not struggle with straining or constipation or diarrhea. ?Allergies  ?Allergen Reactions  ? Codeine Phosphate   ?  REACTION: unspecified  ? ?Current Meds  ?Medication Sig  ? amLODipine (NORVASC) 2.5 MG tablet Take 1 tablet (2.5 mg total) by mouth daily.  ? Bromfenac Sodium (PROLENSA) 0.07 % SOLN Place 1 drop into the left eye 4 (four) times daily.  ? cloNIDine (CATAPRES) 0.1 MG tablet Take 1 tablet (0.1 mg total) by mouth 2 (two) times daily.  ? furosemide (LASIX) 20 MG tablet Take 1 tablet (20 mg total) by mouth daily.  ? levothyroxine (SYNTHROID) 75 MCG tablet Take 1 tablet (75 mcg total) by mouth daily.  ? lisinopril (ZESTRIL) 40 MG tablet Take 1 tablet (40 mg total) by mouth daily.  ? prednisoLONE acetate (PRED FORTE) 1 % ophthalmic suspension Place 1 drop into the left eye 4 (four) times daily.  ? ?Past Medical History:  ?Diagnosis Date  ? BREAST BIOPSY, HX OF 11/27/2006  ? COLONIC POLYPS, HX OF 11/27/2006  ? HEMORRHOIDS 01/17/2010  ? HYPERTENSION 11/27/2006  ? Hypertensive retinopathy   ? OU  ? HYPOTHYROIDISM 11/27/2006  ? Macular degeneration   ? Dry OU  ? MENOPAUSAL SYNDROME 11/27/2006  ? Vascular disease   ? ?Past Surgical History:  ?Procedure Laterality Date  ? ABDOMINAL HYSTERECTOMY    ? APPENDECTOMY    ? BREAST  EXCISIONAL BIOPSY Right 1987  ? BREAST SURGERY    ? bx  ? CATARACT EXTRACTION Bilateral   ? EYE SURGERY Bilateral   ? Cat Sx OU  ? TONSILLECTOMY    ? ?Social History  ? ?Social History Narrative  ? The patient is retired she has been in [place] for more than 50 years she has 3 sons that she is widowed.  ? Never tobacco, no alcohol, 2 caffeinated beverages daily no drugs  ? ?family history includes Heart disease in her father. ? ? ?Review of Systems ?See HPI she ambulates with a cane she has some chronic back pain otherwise negative. ? ?Objective:  ? Physical Exam ?BP 140/70   Pulse 88   Ht 5' (1.524 m)   Wt 150 lb (68 kg)   SpO2 95%   BMI 29.29 kg/m?  ? ? ?Patti [place], [place] present. ? ?DRE nontender no mass or rectocele ? ?Anoscopy w/ Gr  prolapsed RA and a grade 2 LL internalhemorrhoids and Gr 3 RA external ? ? ?

## 2021-10-04 NOTE — Telephone Encounter (Signed)
Last Prolia inj 10/02/21 ?Next Prolia inj due 04/05/22 ?

## 2021-10-21 NOTE — Progress Notes (Signed)
?[facility] Note ? ?10/24/2021 ?  ? ?CHIEF COMPLAINT ?Patient presents for Retina Follow Up ? ?HISTORY OF PRESENT ILLNESS: ?Janice Small is a 88 y.o. female who presents to the clinic today for:  ?HPI   ? ? Retina Follow Up   ?Patient presents with  CRVO/BRVO.  In left eye.  This started 4 weeks ago.  I, the attending physician,  performed the HPI with the patient and updated documentation appropriately. ? ?  ?  ? ? Comments   ?Patient here for 4 weeks retina follow up for BRVO OS. Patient states vision can see. No eye pain.  ? ?  ?  ?Last edited by Bernarda Caffey, MD on 10/24/2021 10:13 AM.  ?  ? ?Patient states no change in vision ? ?Referring physician: ?[name], NP ?[address] ?[address] ?[place],  [place] [address] ? ?HISTORICAL INFORMATION:  ? ?Selected notes from the [facility] ?Referred by Dr. Jerline Pain for eval of BRVO OS ?LEE: 10.12.2021 ?Ocular Hx- Glc suspect  ? ?CURRENT MEDICATIONS: ?Current Outpatient Medications (Ophthalmic Drugs)  ?Medication Sig  ? Bromfenac Sodium (PROLENSA) 0.07 % SOLN Place 1 drop into the left eye 4 (four) times daily.  ? prednisoLONE acetate (PRED FORTE) 1 % ophthalmic suspension Place 1 drop into the left eye 4 (four) times daily.  ? ?No current facility-administered medications for this visit. (Ophthalmic Drugs)  ? ?Current Outpatient Medications (Other)  ?Medication Sig  ? amLODipine (NORVASC) 2.5 MG tablet Take 1 tablet (2.5 mg total) by mouth daily.  ? cloNIDine (CATAPRES) 0.1 MG tablet Take 1 tablet (0.1 mg total) by mouth 2 (two) times daily.  ? furosemide (LASIX) 20 MG tablet Take 1 tablet (20 mg total) by mouth daily.  ? levothyroxine (SYNTHROID) 75 MCG tablet Take 1 tablet (75 mcg total) by mouth daily.  ? lisinopril (ZESTRIL) 40 MG tablet Take 1 tablet (40 mg total) by mouth daily.  ? ?No current facility-administered medications for this visit. (Other)  ? ?REVIEW OF SYSTEMS: ?ROS   ?Positive for: Gastrointestinal,  Musculoskeletal, Eyes ?Negative for: Constitutional, Neurological, Skin, Genitourinary, HENT, Endocrine, Cardiovascular, Respiratory, Psychiatric, Allergic/Imm, Heme/Lymph ?Last edited by Theodore Demark, COA on 10/24/2021  9:17 AM.  ?  ? ?ALLERGIES ?Allergies  ?Allergen Reactions  ? Codeine Phosphate   ?  REACTION: unspecified  ? ?PAST MEDICAL HISTORY ?Past Medical History:  ?Diagnosis Date  ? BREAST BIOPSY, HX OF 11/27/2006  ? COLONIC POLYPS, HX OF 11/27/2006  ? HEMORRHOIDS 01/17/2010  ? HYPERTENSION 11/27/2006  ? Hypertensive retinopathy   ? OU  ? HYPOTHYROIDISM 11/27/2006  ? Macular degeneration   ? Dry OU  ? MENOPAUSAL SYNDROME 11/27/2006  ? Vascular disease   ? ?Past Surgical History:  ?Procedure Laterality Date  ? ABDOMINAL HYSTERECTOMY    ? APPENDECTOMY    ? BREAST EXCISIONAL BIOPSY Right 1987  ? BREAST SURGERY    ? bx  ? CATARACT EXTRACTION Bilateral   ? EYE SURGERY Bilateral   ? Cat Sx OU  ? TONSILLECTOMY    ? ?FAMILY HISTORY ?Family History  ?Problem Relation Age of Onset  ? Heart disease Father   ? Colon cancer Neg Hx   ? Esophageal cancer Neg Hx   ? Pancreatic cancer Neg Hx   ? Liver cancer Neg Hx   ? Stomach cancer Neg Hx   ? ?SOCIAL HISTORY ?Social History  ? ?Tobacco Use  ? Smoking status: Never  ? Smokeless tobacco: Never  ?  Vaping Use  ? Vaping Use: Never used  ?Substance Use Topics  ? Alcohol use: Yes  ?  Comment: rarely  ? Drug use: No  ?  ? ?  ?OPHTHALMIC EXAM: ? ?Base Eye Exam   ? ? Visual Acuity (Snellen - Linear)   ? ?   Right Left  ? Dist cc 20/20 -2 20/30  ? Dist ph cc  NI  ? ? Correction: Glasses  ? ?  ?  ? ? Tonometry (Tonopen, 9:15 AM)   ? ?   Right Left  ? Pressure 16 19  ? ?  ?  ? ? Pupils   ? ?   Dark Light Shape React APD  ? Right 3 2 Round Minimal None  ? Left 3 2 Round Minimal None  ? ?  ?  ? ? Visual Fields (Counting fingers)   ? ?   Left Right  ?  Full Full  ? ?  ?  ? ? Extraocular Movement   ? ?   Right Left  ?  Full, Ortho Full, Ortho  ? ?  ?  ? ? Neuro/Psych   ? ? Oriented x3:  Yes  ? Mood/Affect: Normal  ? ?  ?  ? ? Dilation   ? ? Both eyes: 1.0% Mydriacyl, 2.5% Phenylephrine @ 9:15 AM  ? ?  ?  ? ?  ? ?Slit Lamp and Fundus Exam   ? ? Slit Lamp Exam   ? ?   Right Left  ? Lids/Lashes Dermatochalasis - upper lid Dermatochalasis - upper lid, mild Meibomian gland dysfunction  ? Conjunctiva/Sclera White and quiet White and quiet  ? Cornea 1-2+ fine Punctate epithelial erosions, well healed temporal cataract wounds, arcus, early band K nasal and temporal 2-3+ inferior Punctate epithelial erosions, well healed temporal cataract wounds, arcus  ? Anterior Chamber Deep and quiet Deep and quiet  ? Iris Round and dilated Round and dilated  ? Lens 3 piece PC IOL in good position with open PC 3 piece PC IOL in good position, 1+PCO (non-central)  ? Anterior Vitreous Vitreous syneresis, Posterior vitreous detachment Vitreous syneresis, Posterior vitreous detachment  ? ?  ?  ? ? Fundus Exam   ? ?   Right Left  ? Disc Pink and Sharp, Compact mild Pallor, Sharp rim, temporal PPA, Compact  ? C/D Ratio 0.3 0.2  ? Macula Flat, Blunted foveal reflex, mild Drusen, RPE mottling and clumping, no heme or edema Blunted foveal reflex, focal IRH and exudates temporal macula -- persistent, cystic changes and edema -- slightly improved, +laser changes, Drusen  ? Vessels attenuated, Tortuous Tortuous, severe attenuation of vessels superior macula  ? Periphery Attached, No heme  Attached, mild reticular degeneration  ? ?  ?  ? ?  ? ?Refraction   ? ? Wearing Rx   ? ?   Sphere Cylinder Axis Add  ? Right -1.00 +0.50 027 +2.50  ? Left -0.75 +0.75 083 +2.50  ? ? Type: progressive  ? ?  ?  ? ?  ? ?IMAGING AND PROCEDURES  ?Imaging and Procedures for 10/24/2021 ? ?OCT, Retina - OU - Both Eyes   ? ?   ?Right Eye ?Quality was good. Central Foveal Thickness: 268. Progression has been stable. Findings include normal foveal contour, no IRF, no SRF, retinal drusen .  ? ?Left Eye ?Quality was good. Central Foveal Thickness: 270.  Progression has improved. Findings include normal foveal contour, intraretinal fluid, intraretinal hyper-reflective material, retinal drusen ,  no SRF (Mild interval improvement in IRF/IRHM temporal macula and fovea).  ? ?Notes ?*Images captured and stored on drive ? ?Diagnosis / Impression:  ?OD: non-exu ARMD w/ fine drusen ?OS: BRVO -- mild interval improvement in IRF/IRHM temporal macula and fovea ? ?Clinical management:  ?See below ? ?Abbreviations: NFP - Normal foveal profile. CME - cystoid macular edema. PED - pigment epithelial detachment. IRF - intraretinal fluid. SRF - subretinal fluid. EZ - ellipsoid zone. ERM - epiretinal membrane. ORA - outer retinal atrophy. ORT - outer retinal tubulation. SRHM - subretinal hyper-reflective material. IRHM - intraretinal hyper-reflective material ? ? ?  ? ?Intravitreal Injection, Pharmacologic Agent - OS - Left Eye   ? ?   ?Time Out ?10/24/2021. 9:43 AM. Confirmed correct patient, procedure, site, and patient consented.  ? ?Anesthesia ?Topical anesthesia was used. Anesthetic medications included Proparacaine 0.5%, Lidocaine 2%.  ? ?Procedure ?Preparation included 5% betadine to ocular surface, eyelid speculum. A (32g) needle was used.  ? ?Injection: ?2 mg aflibercept 2 MG/0.05ML ?  Route: Intravitreal, Site: Left Eye ?  [place]: [id number], Lot: [id number], Expiration date: 09/10/2022, Waste: 0 mL  ? ?Post-op ?Post injection exam found visual acuity of at least counting fingers. The patient tolerated the procedure well. There were no complications. The patient received written and verbal post procedure care education. Post injection medications were not given.  ? ?  ? ?  ?  ? ?  ?ASSESSMENT/PLAN: ? ?  ICD-10-CM   ?1. Branch retinal vein occlusion of left eye with macular edema  H34.8320 OCT, Retina - OU - Both Eyes  ?  Intravitreal Injection, Pharmacologic Agent - OS - Left Eye  ?  aflibercept (EYLEA) SOLN 2 mg  ?  ?2. Essential hypertension  I10   ?  ?3. Hypertensive  retinopathy of both eyes  H35.033   ?  ?4. Early dry stage nonexudative age-related macular degeneration of both eyes  H35.3131   ?  ?5. Pseudophakia, both eyes  Z96.1   ?  ?6. Glaucoma suspect of both eyes  H40.003   ?

## 2021-10-24 ENCOUNTER — Encounter (INDEPENDENT_AMBULATORY_CARE_PROVIDER_SITE_OTHER): Payer: Self-pay | Admitting: Ophthalmology

## 2021-10-24 ENCOUNTER — Ambulatory Visit (INDEPENDENT_AMBULATORY_CARE_PROVIDER_SITE_OTHER): Payer: Medicare Other | Admitting: Ophthalmology

## 2021-10-24 DIAGNOSIS — H353131 Nonexudative age-related macular degeneration, bilateral, early dry stage: Secondary | ICD-10-CM

## 2021-10-24 DIAGNOSIS — I10 Essential (primary) hypertension: Secondary | ICD-10-CM

## 2021-10-24 DIAGNOSIS — H34832 Tributary (branch) retinal vein occlusion, left eye, with macular edema: Secondary | ICD-10-CM

## 2021-10-24 DIAGNOSIS — H35033 Hypertensive retinopathy, bilateral: Secondary | ICD-10-CM | POA: Diagnosis not present

## 2021-10-24 DIAGNOSIS — H40003 Preglaucoma, unspecified, bilateral: Secondary | ICD-10-CM

## 2021-10-24 DIAGNOSIS — Z961 Presence of intraocular lens: Secondary | ICD-10-CM

## 2021-10-24 MED ORDER — AFLIBERCEPT 2MG/0.05ML IZ SOLN FOR KALEIDOSCOPE
2.0000 mg | INTRAVITREAL | Status: AC | PRN
  Administered 2021-10-24: 2 mg via INTRAVITREAL

## 2021-11-04 ENCOUNTER — Encounter (HOSPITAL_BASED_OUTPATIENT_CLINIC_OR_DEPARTMENT_OTHER): Payer: Self-pay | Admitting: Nurse Practitioner

## 2021-11-04 ENCOUNTER — Ambulatory Visit (HOSPITAL_BASED_OUTPATIENT_CLINIC_OR_DEPARTMENT_OTHER): Payer: Medicare Other | Admitting: Nurse Practitioner

## 2021-11-04 VITALS — BP 128/88 | HR 75 | Ht 60.0 in | Wt 148.4 lb

## 2021-11-04 DIAGNOSIS — I10 Essential (primary) hypertension: Secondary | ICD-10-CM

## 2021-11-04 DIAGNOSIS — M818 Other osteoporosis without current pathological fracture: Secondary | ICD-10-CM | POA: Diagnosis not present

## 2021-11-04 DIAGNOSIS — E210 Primary hyperparathyroidism: Secondary | ICD-10-CM

## 2021-11-04 DIAGNOSIS — E039 Hypothyroidism, unspecified: Secondary | ICD-10-CM

## 2021-11-04 DIAGNOSIS — G4700 Insomnia, unspecified: Secondary | ICD-10-CM

## 2021-11-04 DIAGNOSIS — H9391 Unspecified disorder of right ear: Secondary | ICD-10-CM

## 2021-11-04 LAB — LIPID PANEL
Chol/HDL Ratio: 1.9 ratio (ref 0.0–4.4)
Cholesterol, Total: 167 mg/dL (ref 100–199)
HDL: 86 mg/dL (ref >39)
LDL Chol Calc (NIH): 69 mg/dL (ref 0–99)
Triglycerides: 58 mg/dL (ref 0–149)
VLDL Cholesterol Cal: 12 mg/dL (ref 5–40)

## 2021-11-04 LAB — CBC WITH DIFFERENTIAL/PLATELET
Basophils Absolute: 0 10*3/uL (ref 0.0–0.2)
Basos: 1 %
EOS (ABSOLUTE): 0.2 10*3/uL (ref 0.0–0.4)
Eos: 3 %
Hematocrit: 36.4 % (ref 34.0–46.6)
Hemoglobin: 12.1 g/dL (ref 11.1–15.9)
Immature Grans (Abs): 0 10*3/uL (ref 0.0–0.1)
Immature Granulocytes: 0 %
Lymphocytes Absolute: 1.1 10*3/uL (ref 0.7–3.1)
Lymphs: 23 %
MCH: 31.3 pg (ref 26.6–33.0)
MCHC: 33.2 g/dL (ref 31.5–35.7)
MCV: 94 fL (ref 79–97)
Monocytes Absolute: 0.5 10*3/uL (ref 0.1–0.9)
Monocytes: 10 %
Neutrophils Absolute: 2.9 10*3/uL (ref 1.4–7.0)
Neutrophils: 63 %
Platelets: 211 10*3/uL (ref 150–450)
RBC: 3.86 x10E6/uL (ref 3.77–5.28)
RDW: 12.1 % (ref 11.7–15.4)
WBC: 4.7 10*3/uL (ref 3.4–10.8)

## 2021-11-04 LAB — COMPREHENSIVE METABOLIC PANEL
ALT: 36 IU/L — ABNORMAL HIGH (ref 0–32)
AST: 21 IU/L (ref 0–40)
Albumin/Globulin Ratio: 2.1 (ref 1.2–2.2)
Albumin: 4.2 g/dL (ref 3.5–4.6)
Alkaline Phosphatase: 70 IU/L (ref 44–121)
BUN/Creatinine Ratio: 27 (ref 12–28)
BUN: 25 mg/dL (ref 10–36)
Bilirubin Total: 0.4 mg/dL (ref 0.0–1.2)
CO2: 23 mmol/L (ref 20–29)
Calcium: 10.3 mg/dL (ref 8.7–10.3)
Chloride: 102 mmol/L (ref 96–106)
Creatinine, Ser: 0.93 mg/dL (ref 0.57–1.00)
Globulin, Total: 2 g/dL (ref 1.5–4.5)
Glucose: 94 mg/dL (ref 70–99)
Potassium: 4.5 mmol/L (ref 3.5–5.2)
Sodium: 141 mmol/L (ref 134–144)
Total Protein: 6.2 g/dL (ref 6.0–8.5)
eGFR: 58 mL/min/{1.73_m2} — ABNORMAL LOW (ref >59)

## 2021-11-04 NOTE — Assessment & Plan Note (Signed)
Chronic.  Well-controlled today. ?Recommend continuation of current medications as these appear to be helpful for her blood pressure. ?Recommend continuation of monitoring her blood pressure in the morning and let me know if she begins to experience frequent low blood pressures or any symptoms of dizziness, headache, shortness of breath, presyncope.  We will continue to monitor closely. ?

## 2021-11-04 NOTE — Patient Instructions (Addendum)
It was a pleasure seeing you today. I hope your time spent with [place] was pleasant and helpful. Please let us know if there is anything we can do to improve the service you receive.  ? ? ?We will monitor labs today to make sure everything looks ok. I will be in touch with you if there are any concerning findings on your labs.  ?I recommend melatonin '5mg'$  at bedtime to see if this helps with sleep. If this does not seem to help, let me know ?I want you let me know if the sound in your ears is getting worse or coming more frequently.  ? ? ? ? ? ?Important Office Information ?Lab Results ?If labs were ordered, please note that you will see results through [facility] as soon as they come available from [facility].  ?It takes up to 5 business days for the results to be routed to me and for me to review them once all of the lab results have come through from [facility]. I will make recommendations based on your results and send these through [place] or someone from the office will call you to discuss. If your labs are abnormal, we may contact you to schedule a visit to discuss the results and make recommendations.  ?If you have not heard from [place] within 5 business days or you have waited longer than a week and your lab results have not come through on [place], please feel free to call the office or send a message through [place] to follow-up on these labs.  ? ?Referrals ?If referrals were placed today, the office where the referral was sent will contact you either by phone or through [place] to set up scheduling. Please note that it can take up to a week for the referral office to contact you. If you do not hear from them in a week, please contact the referral office directly to inquire about scheduling.  ? ?Condition Treated ?If your condition worsens or you begin to have new symptoms, please schedule a follow-up appointment for further evaluation. If you are not sure if an appointment is needed, you may call the office to leave a  message for the nurse and someone will contact you with recommendations.  ?If you have an urgent or life threatening emergency, please do not call the office, but seek emergency evaluation by calling 911 or going to the nearest emergency room for evaluation.  ? ?MyChart and Phone Calls ?Please do not use MyChart for urgent messages. It may take up to 3 business days for MyChart messages to be read by staff and if they are unable to handle the request, an additional 3 business days for them to be routed to me and for my response.  ?Messages sent to the provider through [place] do not come directly to the provider, please allow time for these messages to be routed and for me to respond.  ?We get a large volume of MyChart messages daily and these are responded to in the order received.  ? ?For urgent messages, please call the office at [phone] and speak with the front office staff or leave a message on the line of my assistant for guidance.  ?We are seeing patients from the hours of 8:00 am through 5:00 pm and calls directly to the nurse may not be answered immediately due to seeing patients, but your call will be returned as soon as possible.  ?Phone  messages received after 4:00 PM Monday through Thursday may not be  returned until the following business day. Phone messages received after 11:00 AM on Friday may not be returned until Monday.  ? ?After Hours ?We share on call hours with providers from other offices. If you have an urgent need after hours that cannot wait until the next business day, please contact the on call provider by calling the office number. A nurse will speak with you and contact the provider if needed for recommendations.  ?If you have an urgent or life threatening emergency after hours, please do not call the on call provider, but seek emergency evaluation by calling 911 or going to the nearest emergency room for evaluation.  ? ?Paperwork ?All paperwork requires a minimum of 5 days to  complete and return to you or the designated personnel. Please keep this in mind when bringing in forms or sending requests for paperwork completion to the office.  ?  ?

## 2021-11-04 NOTE — Assessment & Plan Note (Signed)
Chronic.  Currently managed with endocrinology.  Review of recent labs today showed no concerning findings.  No alarm symptoms present today.  We will continue to collaborate care with endocrinology. ?

## 2021-11-04 NOTE — Assessment & Plan Note (Signed)
Chronic.  Currently managed by endocrinology.  Review of recent labs today show normal results.  No changes to plan of care at this time. ?

## 2021-11-04 NOTE — Assessment & Plan Note (Signed)
Patient endorses "squeaking" sound with prolonged pressure on the right ear.  Mild clear effusion is present within the right ear with no signs of erythema, edema, drainage present today.  Unclear if the status she is hearing is related to drainage of effusion versus precursor for possible vestibular nerve dysfunction. ?Discussed with patient the previous etiology of her sensorineural hearing loss is likely ending by the providers as this is often the case however the fact that her symptoms did resolve quickly with steroids indicate an inflammatory process that was present.  She has no alarm symptoms present at this time.  Rather than begin treatment with steroids without any signs of hearing loss recommend close monitoring.  Did discuss with patient the option of going back to ENT or audiology for further evaluation however she declines at this time.  We will continue to monitor closely and make changes to plan of care as necessary. ?

## 2021-11-04 NOTE — Assessment & Plan Note (Signed)
Difficulty falling asleep on 7/8.  Given patient's age and overall health status recommend very conservative treatment at this time.  Discussed with patient the option to trial over-the-counter melatonin 5 mg as needed at bedtime to see if this is helpful for symptoms.  Will need to use extreme caution in medication choices given her age.  She will let me know if her symptoms do not improve with as needed use of melatonin and we will consider alternative treatment measures. ?

## 2021-11-04 NOTE — Progress Notes (Signed)
?Worthy Keeler, DNP, AGNP-c ?[facility] ?[address] ?[address] ?[place], [place] [address] ?[phone] Office [phone] Fax ? ?ESTABLISHED PATIENT- Chronic Health and/or Follow-Up Visit ? ?Blood pressure 128/88, pulse 75, height 5' (1.524 m), weight 148 lb 6.4 oz (67.3 kg), SpO2 97 %. ? ?Follow-up (Hypertension, Insomnia, Ear) ? ? ?HPI ? ?Janice Small  is a 88 y.o. year old female presenting today for evaluation and management of the following: ?Hypothyroidism ?Currently managed with endocrinology ?No new symptoms or concerns today ?Most recent labs with endo completed 2 months ago ?Hypertension ?Blood pressure can be "really low" sometimes in the morning after she has taken her medication ?She says it will be down in the 90's, but does not stay that way long ?She endorses her BP at home is mostly "normal" ?No sx with low BP ?No HA, CP, dizziness, falls ?Insomnia ?On and off having a hard time falling asleep because she "cant shut my mind down" ?She endorses laying down around 10 pm and watching a television program then turning the television off ?She reports some nights she will fall asleep easily, but other nights she thinks about the day and and events and cannot fall asleep ?She has not tried anything for this ?She does endorse waking some during the night ?She tries to keep a bedtime routine and does not have caffeine or food close to bedtime ?Squeaky in ear ?She endorses an internal ear sound of "squeaking" on the right ear when she lays on that side for too long ?She reports this does not occur during the day, but only at night  ?She tells me she had this sensation in the ear prior to the recent sensorineural hearing loss she experienced at the end of last year ?She denies any hearing loss, pain, pressure, or drainage from the ear at this time ?She endorses the only current symptom is the intermittent sound, which is not always present.  ?She has seen ENT and  audiology, but reports that no one told her what had caused the hearing loss with the last episode ?The last episode resolved with steroids ? ?ROS ?All ROS negative with exception of what is listed in HPI ? ?PHYSICAL EXAM ?Physical Exam ?Vitals and nursing note reviewed.  ?Constitutional:   ?   Appearance: Normal appearance. She is normal weight.  ?HENT:  ?   Head: Normocephalic.  ?   Left Ear: Tympanic membrane normal.  ?   Ears:  ?   Comments: Clear effusion present in right ear. TM intact. No erythema, bulging, or drainage present.  ?   Nose: Nose normal.  ?   Mouth/Throat:  ?   Mouth: Mucous membranes are moist.  ?   Pharynx: Oropharynx is clear.  ?Eyes:  ?   Extraocular Movements: Extraocular movements intact.  ?   Conjunctiva/sclera: Conjunctivae normal.  ?   Pupils: Pupils are equal, round, and reactive to light.  ?Neck:  ?   Vascular: No carotid bruit.  ?Cardiovascular:  ?   Rate and Rhythm: Normal rate and regular rhythm.  ?   Pulses: Normal pulses.  ?   Heart sounds: Normal heart sounds.  ?Pulmonary:  ?   Effort: Pulmonary effort is normal.  ?   Breath sounds: Normal breath sounds.  ?Musculoskeletal:     ?   General: Normal range of motion.  ?   Cervical back: Normal range of motion.  ?   Right lower leg: No edema.  ?  Left lower leg: No edema.  ?Lymphadenopathy:  ?   Cervical: No cervical adenopathy.  ?Skin: ?   General: Skin is warm and dry.  ?   Capillary Refill: Capillary refill takes less than 2 seconds.  ?Neurological:  ?   General: No focal deficit present.  ?   Mental Status: She is alert and oriented to person, place, and time.  ?Psychiatric:     ?   Mood and Affect: Mood normal.     ?   Behavior: Behavior normal.     ?   Thought Content: Thought content normal.     ?   Judgment: Judgment normal.  ? ? ?ASSESSMENT & PLAN ?Problem List Items Addressed This Visit   ? ? Hypothyroidism  ?  Chronic.  Currently managed by endocrinology.  Review of recent labs today show normal results.  No changes to  plan of care at this time. ? ?  ?  ? Essential hypertension - Primary  ?  Chronic.  Well-controlled today. ?Recommend continuation of current medications as these appear to be helpful for her blood pressure. ?Recommend continuation of monitoring her blood pressure in the morning and let me know if she begins to experience frequent low blood pressures or any symptoms of dizziness, headache, shortness of breath, presyncope.  We will continue to monitor closely. ? ?  ?  ? Relevant Orders  ? CBC with Differential/Platelet  ? Comprehensive metabolic panel  ? Lipid panel  ? Hypercalcemia  ?  We will review labs today for further evaluation.  No alarm symptoms present. ? ?  ?  ? Primary hyperparathyroidism ([place])  ?  Chronic.  Currently managed with endocrinology.  Review of recent labs today showed no concerning findings.  No alarm symptoms present today.  We will continue to collaborate care with endocrinology. ? ?  ?  ? Ear problem, right  ?  Patient endorses "squeaking" sound with prolonged pressure on the right ear.  Mild clear effusion is present within the right ear with no signs of erythema, edema, drainage present today.  Unclear if the status she is hearing is related to drainage of effusion versus precursor for possible vestibular nerve dysfunction. ?Discussed with patient the previous etiology of her sensorineural hearing loss is likely ending by the providers as this is often the case however the fact that her symptoms did resolve quickly with steroids indicate an inflammatory process that was present.  She has no alarm symptoms present at this time.  Rather than begin treatment with steroids without any signs of hearing loss recommend close monitoring.  Did discuss with patient the option of going back to ENT or audiology for further evaluation however she declines at this time.  We will continue to monitor closely and make changes to plan of care as necessary. ? ?  ?  ? Insomnia  ?  Difficulty falling asleep  on 7/8.  Given patient's age and overall health status recommend very conservative treatment at this time.  Discussed with patient the option to trial over-the-counter melatonin 5 mg as needed at bedtime to see if this is helpful for symptoms.  Will need to use extreme caution in medication choices given her age.  She will let me know if her symptoms do not improve with as needed use of melatonin and we will consider alternative treatment measures. ? ?  ?  ? Osteoporosis  ? Relevant Orders  ? CBC with Differential/Platelet  ? Comprehensive metabolic panel  ? Lipid  panel  ? ? ? ? ?FOLLOW-UP ?Return in about 6 months (around 05/06/2022) for labs, HTN, Vit D (29m. ? ? ?SWorthy Keeler DNP, AGNP-c ?

## 2021-11-04 NOTE — Assessment & Plan Note (Signed)
We will review labs today for further evaluation.  No alarm symptoms present. ?

## 2021-11-11 NOTE — Progress Notes (Signed)
?[facility] Note ? ?11/21/2021 ?  ? ?CHIEF COMPLAINT ?Patient presents for Retina Follow Up ? ?HISTORY OF PRESENT ILLNESS: ?Janice Small is a 88 y.o. female who presents to the clinic today for:  ?HPI   ? ? Retina Follow Up   ?Patient presents with  CRVO/BRVO.  In left eye.  Severity is moderate.  Duration of 4 weeks.  Since onset it is stable.  I, the attending physician,  performed the HPI with the patient and updated documentation appropriately. ? ?  ?  ? ? Comments   ?Pt here for 4 wk ret f/u for BRVO OS. Pt states VA is the same. Some puffiness reported on OD upper eyelid, no itching or redness. Pt reports using warm compress.  ? ?  ?  ?Last edited by Bernarda Caffey, MD on 11/21/2021  8:46 AM.  ?  ?Patient states in her day to day life she doesn't notice any problems with her vision, she is able to read the newspaper and the small writing on medication bottles, she states her right eye is swollen this morning, Prolensa irritates her left eye, pt has started a new medication for BP and states it is doing much better ? ?Referring physician: ?[name], NP ?[address] ?[address] ?[place],  [place] [address] ? ?HISTORICAL INFORMATION:  ? ?Selected notes from the [facility] ?Referred by Dr. Jerline Pain for eval of BRVO OS ?LEE: 10.12.2021 ?Ocular Hx- Glc suspect  ? ?CURRENT MEDICATIONS: ?Current Outpatient Medications (Ophthalmic Drugs)  ?Medication Sig  ? Bromfenac Sodium (PROLENSA) 0.07 % SOLN Place 1 drop into the left eye 4 (four) times daily.  ? prednisoLONE acetate (PRED FORTE) 1 % ophthalmic suspension Place 1 drop into the left eye 4 (four) times daily.  ? ?No current facility-administered medications for this visit. (Ophthalmic Drugs)  ? ?Current Outpatient Medications (Other)  ?Medication Sig  ? amLODipine (NORVASC) 2.5 MG tablet Take 1 tablet (2.5 mg total) by mouth daily.  ? cloNIDine (CATAPRES) 0.1 MG tablet Take 1 tablet (0.1 mg total) by mouth 2 (two) times  daily.  ? furosemide (LASIX) 20 MG tablet Take 1 tablet (20 mg total) by mouth daily.  ? levothyroxine (SYNTHROID) 75 MCG tablet Take 1 tablet (75 mcg total) by mouth daily.  ? lisinopril (ZESTRIL) 40 MG tablet Take 1 tablet (40 mg total) by mouth daily.  ? ?No current facility-administered medications for this visit. (Other)  ? ?REVIEW OF SYSTEMS: ?ROS   ?Positive for: Gastrointestinal, Musculoskeletal, Eyes ?Negative for: Constitutional, Neurological, Skin, Genitourinary, HENT, Endocrine, Cardiovascular, Respiratory, Psychiatric, Allergic/Imm, Heme/Lymph ?Last edited by Kingsley Spittle, COT on 11/21/2021  8:15 AM.  ?  ? ? ?ALLERGIES ?Allergies  ?Allergen Reactions  ? Codeine Phosphate   ?  REACTION: unspecified  ? ?PAST MEDICAL HISTORY ?Past Medical History:  ?Diagnosis Date  ? BREAST BIOPSY, HX OF 11/27/2006  ? COLONIC POLYPS, HX OF 11/27/2006  ? HEMORRHOIDS 01/17/2010  ? HYPERTENSION 11/27/2006  ? Hypertensive retinopathy   ? OU  ? HYPOTHYROIDISM 11/27/2006  ? Macular degeneration   ? Dry OU  ? MENOPAUSAL SYNDROME 11/27/2006  ? Vascular disease   ? ?Past Surgical History:  ?Procedure Laterality Date  ? ABDOMINAL HYSTERECTOMY    ? APPENDECTOMY    ? BREAST EXCISIONAL BIOPSY Right 1987  ? BREAST SURGERY    ? bx  ? CATARACT EXTRACTION Bilateral   ? EYE SURGERY Bilateral   ? Cat Sx OU  ? HEMORRHOID  BANDING    ? TONSILLECTOMY    ? ?FAMILY HISTORY ?Family History  ?Problem Relation Age of Onset  ? Heart disease Father   ? Colon cancer Neg Hx   ? Esophageal cancer Neg Hx   ? Pancreatic cancer Neg Hx   ? Liver cancer Neg Hx   ? Stomach cancer Neg Hx   ? ?SOCIAL HISTORY ?Social History  ? ?Tobacco Use  ? Smoking status: Never  ? Smokeless tobacco: Never  ?Vaping Use  ? Vaping Use: Never used  ?Substance Use Topics  ? Alcohol use: Yes  ?  Comment: rarely  ? Drug use: No  ?  ? ?  ?OPHTHALMIC EXAM: ? ?Base Eye Exam   ? ? Visual Acuity (Snellen - Linear)   ? ?   Right Left  ? Dist cc 20/25 +1 20/50 +1  ? Dist ph cc NI NI   ? ? Correction: Glasses  ? ?  ?  ? ? Tonometry (Tonopen, 8:31 AM)   ? ?   Right Left  ? Pressure 18 24  ? ?  ?  ? ? Tonometry #2 (Tonopen, 8:31 AM)   ? ?   Right Left  ? Pressure  21  ? ?  ?  ? ? Pupils   ? ?   Dark Light Shape React APD  ? Right 3 2 Round Brisk None  ? Left 3 2 Round Minimal None  ? ?  ?  ? ? Visual Fields (Counting fingers)   ? ?   Left Right  ?  Full Full  ? ?  ?  ? ? Extraocular Movement   ? ?   Right Left  ?  Full, Ortho Full, Ortho  ? ?  ?  ? ? Neuro/Psych   ? ? Oriented x3: Yes  ? Mood/Affect: Normal  ? ?  ?  ? ? Dilation   ? ? Both eyes: 1.0% Mydriacyl, 2.5% Phenylephrine @ 8:32 AM  ? ?  ?  ? ?  ? ?Slit Lamp and Fundus Exam   ? ? Slit Lamp Exam   ? ?   Right Left  ? Lids/Lashes Dermatochalasis - upper lid Dermatochalasis - upper lid, mild Meibomian gland dysfunction  ? Conjunctiva/Sclera White and quiet White and quiet  ? Cornea 1-2+ fine Punctate epithelial erosions, well healed temporal cataract wounds, arcus, early band K nasal and temporal, tear film debris 2-3+ diffuse, fine Punctate epithelial erosions, well healed temporal cataract wounds, arcus, tear film debris  ? Anterior Chamber Deep and quiet Deep and quiet  ? Iris Round and dilated Round and dilated  ? Lens 3 piece PC IOL in good position with open PC 3 piece PC IOL in good position, 1+PCO (non-central)  ? Anterior Vitreous Vitreous syneresis, Posterior vitreous detachment Vitreous syneresis, Posterior vitreous detachment  ? ?  ?  ? ? Fundus Exam   ? ?   Right Left  ? Disc Pink and Sharp, Compact mild Pallor, Sharp rim, temporal PPA, Compact  ? C/D Ratio 0.5 0.2  ? Macula Flat, Blunted foveal reflex, mild Drusen, RPE mottling and clumping, no heme or edema Blunted foveal reflex, focal IRH and exudates temporal macula -- persistent, cystic changes and edema -- persistent, +laser changes, Drusen  ? Vessels attenuated, mild tortuosity Tortuous, severe attenuation of vessels superior macula  ? Periphery Attached, No heme  Attached,  mild reticular degeneration  ? ?  ?  ? ?  ? ?Refraction   ? ?  Wearing Rx   ? ?   Sphere Cylinder Axis Add  ? Right -1.00 +0.50 027 +2.50  ? Left -0.75 +0.75 083 +2.50  ? ? Type: progressive  ? ?  ?  ? ? Manifest Refraction   ? ?   Sphere Cylinder Axis Dist VA  ? Right -1.00 +0.75 015 20/25+2  ? Left -2.25 +1.00 070 20/40  ? ?  ?  ? ?  ? ?IMAGING AND PROCEDURES  ?Imaging and Procedures for 11/21/2021 ? ?OCT, Retina - OU - Both Eyes   ? ?   ?Right Eye ?Quality was good. Central Foveal Thickness: 265. Progression has been stable. Findings include normal foveal contour, no IRF, no SRF, retinal drusen .  ? ?Left Eye ?Quality was good. Central Foveal Thickness: 269. Progression has been stable. Findings include normal foveal contour, intraretinal fluid, intraretinal hyper-reflective material, retinal drusen , no SRF (Persistent IRF/IRHM temporal macula and fovea).  ? ?Notes ?*Images captured and stored on drive ? ?Diagnosis / Impression:  ?OD: non-exu ARMD w/ fine drusen ?OS: BRVO -- persistent IRF/IRHM temporal macula and fovea ? ?Clinical management:  ?See below ? ?Abbreviations: NFP - Normal foveal profile. CME - cystoid macular edema. PED - pigment epithelial detachment. IRF - intraretinal fluid. SRF - subretinal fluid. EZ - ellipsoid zone. ERM - epiretinal membrane. ORA - outer retinal atrophy. ORT - outer retinal tubulation. SRHM - subretinal hyper-reflective material. IRHM - intraretinal hyper-reflective material ? ? ?  ? ?Intravitreal Injection, Pharmacologic Agent - OS - Left Eye   ? ?   ?Time Out ?11/21/2021. 8:52 AM. Confirmed correct patient, procedure, site, and patient consented.  ? ?Anesthesia ?Topical anesthesia was used. Anesthetic medications included Proparacaine 0.5%, Lidocaine 2%.  ? ?Procedure ?Preparation included 5% betadine to ocular surface, eyelid speculum. A (32g) needle was used.  ? ?Injection: ?2 mg aflibercept 2 MG/0.05ML ?  Route: Intravitreal, Site: Left Eye ?  [place]arden: [id number], Lot:  [id number], Expiration date: 10/12/2022, Waste: 0 mL  ? ?Post-op ?Post injection exam found visual acuity of at least counting fingers. The patient tolerated the procedure well. There were no complications.

## 2021-11-21 ENCOUNTER — Encounter (INDEPENDENT_AMBULATORY_CARE_PROVIDER_SITE_OTHER): Payer: Self-pay | Admitting: Ophthalmology

## 2021-11-21 ENCOUNTER — Ambulatory Visit (INDEPENDENT_AMBULATORY_CARE_PROVIDER_SITE_OTHER): Payer: Medicare Other | Admitting: Ophthalmology

## 2021-11-21 DIAGNOSIS — I10 Essential (primary) hypertension: Secondary | ICD-10-CM | POA: Diagnosis not present

## 2021-11-21 DIAGNOSIS — Z961 Presence of intraocular lens: Secondary | ICD-10-CM

## 2021-11-21 DIAGNOSIS — H34832 Tributary (branch) retinal vein occlusion, left eye, with macular edema: Secondary | ICD-10-CM | POA: Diagnosis not present

## 2021-11-21 DIAGNOSIS — H35033 Hypertensive retinopathy, bilateral: Secondary | ICD-10-CM

## 2021-11-21 DIAGNOSIS — H353131 Nonexudative age-related macular degeneration, bilateral, early dry stage: Secondary | ICD-10-CM

## 2021-11-21 DIAGNOSIS — H40003 Preglaucoma, unspecified, bilateral: Secondary | ICD-10-CM

## 2021-11-21 MED ORDER — PROLENSA 0.07 % OP SOLN: 1.0000 [drp] | Freq: Four times a day (QID) | OPHTHALMIC | 3 refills | Status: DC

## 2021-11-21 MED ORDER — AFLIBERCEPT 2MG/0.05ML IZ SOLN FOR KALEIDOSCOPE
2.0000 mg | INTRAVITREAL | Status: AC | PRN
  Administered 2021-11-21: 2 mg via INTRAVITREAL

## 2021-11-21 MED ORDER — PREDNISOLONE ACETATE 1 % OP SUSP
1.0000 [drp] | Freq: Four times a day (QID) | OPHTHALMIC | 0 refills | Status: DC
Start: 2021-11-21 — End: 2022-04-20

## 2021-11-29 ENCOUNTER — Other Ambulatory Visit (HOSPITAL_BASED_OUTPATIENT_CLINIC_OR_DEPARTMENT_OTHER): Payer: Self-pay | Admitting: Nurse Practitioner

## 2021-11-29 DIAGNOSIS — I10 Essential (primary) hypertension: Secondary | ICD-10-CM

## 2021-12-02 ENCOUNTER — Ambulatory Visit (INDEPENDENT_AMBULATORY_CARE_PROVIDER_SITE_OTHER): Payer: Medicare Other | Admitting: Internal Medicine

## 2021-12-02 ENCOUNTER — Encounter: Payer: Self-pay | Admitting: Internal Medicine

## 2021-12-02 VITALS — BP 138/86 | HR 106 | Ht 60.0 in | Wt 149.6 lb

## 2021-12-02 DIAGNOSIS — K644 Residual hemorrhoidal skin tags: Secondary | ICD-10-CM | POA: Diagnosis not present

## 2021-12-02 DIAGNOSIS — K648 Other hemorrhoids: Secondary | ICD-10-CM | POA: Diagnosis not present

## 2021-12-02 NOTE — Patient Instructions (Signed)
Glad the banding helped!  Please call us to see me again if you need more help with the hemorrhoids.  I appreciate the opportunity to care for you. Gatha Mayer, MD, [facility]

## 2021-12-02 NOTE — Progress Notes (Signed)
   Janice Small 88 y.o. [birth year] [id number]  Assessment & Plan:   Encounter Diagnosis  Name Primary?   Internal and external bleeding hemorrhoids Yes   She is satisfied with her quality of life she is significantly improved after banding on October 03, 2021.  Return as needed.  CC: Early, Coralee Pesa, NP    Subjective:   Chief Complaint: Hemorrhoids follow-up after banding.  HPI The patient is a 88 year old woman who had internal and external bleeding hemorrhoid problems and had internal hemorrhoids banded on October 03, 2021.  Right anterior and left lateral columns were banded.  She had a prompt improvement of symptoms and has minimal or slight bleeding rarely and none in weeks.  She is satisfied with this result. Allergies  Allergen Reactions   Codeine Phosphate     REACTION: unspecified   Current Meds  Medication Sig   amLODipine (NORVASC) 2.5 MG tablet TAKE 1 TABLET BY MOUTH DAILY   Bromfenac Sodium (PROLENSA) 0.07 % SOLN Place 1 drop into the left eye 4 (four) times daily.   cloNIDine (CATAPRES) 0.1 MG tablet Take 1 tablet (0.1 mg total) by mouth 2 (two) times daily.   furosemide (LASIX) 20 MG tablet Take 1 tablet (20 mg total) by mouth daily.   levothyroxine (SYNTHROID) 75 MCG tablet Take 1 tablet (75 mcg total) by mouth daily.   lisinopril (ZESTRIL) 40 MG tablet Take 1 tablet (40 mg total) by mouth daily.   prednisoLONE acetate (PRED FORTE) 1 % ophthalmic suspension Place 1 drop into the left eye 4 (four) times daily.   Past Medical History:  Diagnosis Date   BREAST BIOPSY, HX OF 11/27/2006   COLONIC POLYPS, HX OF 11/27/2006   HEMORRHOIDS 01/17/2010   HYPERTENSION 11/27/2006   Hypertensive retinopathy    OU   HYPOTHYROIDISM 11/27/2006   Macular degeneration    Dry OU   MENOPAUSAL SYNDROME 11/27/2006   Vascular disease    Past Surgical History:  Procedure Laterality Date   ABDOMINAL HYSTERECTOMY     APPENDECTOMY     BREAST EXCISIONAL BIOPSY Right 1987    BREAST SURGERY     bx   CATARACT EXTRACTION Bilateral    EYE SURGERY Bilateral    Cat Sx OU   HEMORRHOID BANDING     TONSILLECTOMY     Social History   Social History Narrative   The patient is retired she has been in [place] for more than 50 years she has 3 sons that she is widowed.   Never tobacco, no alcohol, 2 caffeinated beverages daily no drugs   family history includes Heart disease in her father.   Review of Systems As above  Objective:   Physical Exam BP 138/86   Pulse (!) 106   Ht 5' (1.524 m)   Wt 149 lb 9.6 oz (67.9 kg)   SpO2 97%   BMI 29.22 kg/m

## 2021-12-05 NOTE — Progress Notes (Signed)
[facility] Note  12/19/2021    CHIEF COMPLAINT Patient presents for Retina Follow Up  HISTORY OF PRESENT ILLNESS: Janice Small is a 88 y.o. female who presents to the clinic today for:  HPI     Retina Follow Up   Patient presents with  CRVO/BRVO.  In both eyes.  This started 4 weeks ago.  I, the attending physician,  performed the HPI with the patient and updated documentation appropriately.        Comments   Patient here for 4 weeks retina follow up for  BRVO OS. Patient states vision not so good. Some days better than others. No eye pain. OD swells up.      Last edited by Bernarda Caffey, MD on 12/19/2021  1:30 PM.     Referring physician: [name], NP [address] [place],  [place] [address]  HISTORICAL INFORMATION:   Selected notes from the MEDICAL RECORD NUMBER Referred by Dr. Jerline Pain for eval of BRVO OS LEE: 10.12.2021 Ocular Hx- Glc suspect   CURRENT MEDICATIONS: Current Outpatient Medications (Ophthalmic Drugs)  Medication Sig   Bromfenac Sodium (PROLENSA) 0.07 % SOLN Place 1 drop into the left eye 4 (four) times daily.   prednisoLONE acetate (PRED FORTE) 1 % ophthalmic suspension Place 1 drop into the left eye 4 (four) times daily.   No current facility-administered medications for this visit. (Ophthalmic Drugs)   Current Outpatient Medications (Other)  Medication Sig   amLODipine (NORVASC) 2.5 MG tablet TAKE 1 TABLET BY MOUTH DAILY   cloNIDine (CATAPRES) 0.1 MG tablet TAKE 1 TABLET BY MOUTH TWICE  DAILY   furosemide (LASIX) 20 MG tablet Take 1 tablet (20 mg total) by mouth daily.   levothyroxine (SYNTHROID) 75 MCG tablet Take 1 tablet (75 mcg total) by mouth daily.   lisinopril (ZESTRIL) 40 MG tablet Take 1 tablet (40 mg total) by mouth daily.   No current facility-administered medications for this visit. (Other)   REVIEW OF SYSTEMS: ROS   Positive for: Gastrointestinal, Musculoskeletal, Eyes Negative  for: Constitutional, Neurological, Skin, Genitourinary, HENT, Endocrine, Cardiovascular, Respiratory, Psychiatric, Allergic/Imm, Heme/Lymph Last edited by Theodore Demark, COA on 12/19/2021  8:13 AM.     ALLERGIES Allergies  Allergen Reactions   Codeine Phosphate     REACTION: unspecified   PAST MEDICAL HISTORY Past Medical History:  Diagnosis Date   BREAST BIOPSY, HX OF 11/27/2006   COLONIC POLYPS, HX OF 11/27/2006   HEMORRHOIDS 01/17/2010   HYPERTENSION 11/27/2006   Hypertensive retinopathy    OU   HYPOTHYROIDISM 11/27/2006   Macular degeneration    Dry OU   MENOPAUSAL SYNDROME 11/27/2006   Vascular disease    Past Surgical History:  Procedure Laterality Date   ABDOMINAL HYSTERECTOMY     APPENDECTOMY     BREAST EXCISIONAL BIOPSY Right 1987   BREAST SURGERY     bx   CATARACT EXTRACTION Bilateral    EYE SURGERY Bilateral    Cat Sx OU   HEMORRHOID BANDING     TONSILLECTOMY     FAMILY HISTORY Family History  Problem Relation Age of Onset   Heart disease Father    Colon cancer Neg Hx    Esophageal cancer Neg Hx    Pancreatic cancer Neg Hx    Liver cancer Neg Hx    Stomach cancer Neg Hx    SOCIAL HISTORY Social History   Tobacco Use   Smoking status: Never   Smokeless tobacco:  Never  Vaping Use   Vaping Use: Never used  Substance Use Topics   Alcohol use: Yes    Comment: rarely   Drug use: No       OPHTHALMIC EXAM:  Base Eye Exam     Visual Acuity (Snellen - Linear)       Right Left   Dist cc 20/25 -2 20/40 +2   Dist ph cc NI NI    Correction: Glasses         Tonometry (Tonopen, 8:10 AM)       Right Left   Pressure 20 21         Pupils       Dark Light Shape React APD   Right 3 2 Round Minimal None   Left 3 2 Round Minimal None         Visual Fields (Counting fingers)       Left Right    Full Full         Extraocular Movement       Right Left    Full, Ortho Full, Ortho         Neuro/Psych     Oriented x3: Yes    Mood/Affect: Normal         Dilation     Both eyes: 1.0% Mydriacyl, 2.5% Phenylephrine @ 8:10 AM           Slit Lamp and Fundus Exam     Slit Lamp Exam       Right Left   Lids/Lashes Dermatochalasis - upper lid Dermatochalasis - upper lid, mild Meibomian gland dysfunction   Conjunctiva/Sclera White and quiet White and quiet   Cornea 1-2+ fine Punctate epithelial erosions, well healed temporal cataract wounds, arcus, early band K nasal and temporal, tear film debris 2-3+ diffuse, fine Punctate epithelial erosions, well healed temporal cataract wounds, arcus, tear film debris   Anterior Chamber Deep and quiet Deep and quiet   Iris Round and dilated Round and dilated   Lens 3 piece PC IOL in good position with open PC 3 piece PC IOL in good position, 1+PCO (non-central)   Anterior Vitreous Vitreous syneresis Vitreous syneresis, Posterior vitreous detachment         Fundus Exam       Right Left   Disc Pink and Sharp, Compact mild Pallor, Sharp rim, temporal PPA, Compact   C/D Ratio 0.5 0.4   Macula Flat, Blunted foveal reflex, mild Drusen, RPE mottling and clumping, no heme or edema Blunted foveal reflex, focal IRH and exudates temporal macula -- persistent, cystic changes and edema -- persistent, +laser changes, Drusen   Vessels attenuated, mild tortuosity Tortuous, severe attenuation of vessels superior macula   Periphery Attached, No heme  Attached, mild reticular degeneration           Refraction     Wearing Rx       Sphere Cylinder Axis Add   Right -1.00 +0.50 027 +2.50   Left -0.75 +0.75 083 +2.50    Type: progressive           IMAGING AND PROCEDURES  Imaging and Procedures for 12/19/2021  OCT, Retina - OU - Both Eyes       Right Eye Quality was good. Central Foveal Thickness: 268. Progression has been stable. Findings include normal foveal contour, no IRF, no SRF, retinal drusen .   Left Eye Quality was good. Central Foveal Thickness: 262.  Progression has improved. Findings include normal foveal contour, no  SRF, retinal drusen , intraretinal hyper-reflective material, intraretinal fluid (Persistent IRF/IRHM temporal macula and fovea -- slightly improved).   Notes *Images captured and stored on drive  Diagnosis / Impression:  OD: non-exu ARMD w/ fine drusen OS: BRVO w/ CME -- persistent IRF/IRHM temporal macula and fovea -- slightly improved  Clinical management:  See below  Abbreviations: NFP - Normal foveal profile. CME - cystoid macular edema. PED - pigment epithelial detachment. IRF - intraretinal fluid. SRF - subretinal fluid. EZ - ellipsoid zone. ERM - epiretinal membrane. ORA - outer retinal atrophy. ORT - outer retinal tubulation. SRHM - subretinal hyper-reflective material. IRHM - intraretinal hyper-reflective material      Intravitreal Injection, Pharmacologic Agent - OS - Left Eye       Time Out 12/19/2021. 8:33 AM. Confirmed correct patient, procedure, site, and patient consented.   Anesthesia Topical anesthesia was used. Anesthetic medications included Lidocaine 2%, Proparacaine 0.5%.   Procedure Preparation included 5% betadine to ocular surface, eyelid speculum. A (32g) needle was used.   Injection: 2 mg aflibercept 2 MG/0.05ML   Route: Intravitreal, Site: Left Eye   NDC: [id number], Lot: [id number], Expiration date: 10/11/2022, Waste: 0 mL   Post-op Post injection exam found visual acuity of at least counting fingers. The patient tolerated the procedure well. There were no complications. The patient received written and verbal post procedure care education. Post injection medications were not given.            ASSESSMENT/PLAN:    ICD-10-CM   1. Branch retinal vein occlusion of left eye with macular edema  H34.8320 OCT, Retina - OU - Both Eyes    Intravitreal Injection, Pharmacologic Agent - OS - Left Eye    aflibercept (EYLEA) SOLN 2 mg    2. Essential hypertension  I10     3.  Hypertensive retinopathy of both eyes  H35.033     4. Early dry stage nonexudative age-related macular degeneration of both eyes  H35.3131     5. Pseudophakia, both eyes  Z96.1     6. Glaucoma suspect of both eyes  H40.003      1. BRVO w/ CME OS             - S/P IVA OS #1 (10.18.21), #2 (11.15.21), #3 (12.13.21), #4 (01.10.22), #5 (02.07.22), #6 (03.07.22), #7 (04.04.22), #8 (5.2.22) -- IVA resistance  - S/P IVE OS #1 (05.31.22) -- sample, #2 (06.28.22), #3 (07.26.22), #4 (08.23.22), #5 (09.20.22), #6 (10.20.22), #7 (11.17.22), #8 (12.15.22), #9 (01.12.23), #10 (02.09.23), #11 (03.16.23), #12 (04.13.23), #13 (05.11.23)             - S/P focal laser OS (02.16.22) - BCVA stable at 20/30 - OCT shows persistent IRF/IRHM temporal macula and fovea -- slightly improved - reduce PF/Prolensa to BID OS -- ?steroid response on IOP (PF/Prolensa) started 2.9.23 for possible CME component) - recommend IVE OS #14 today, 06.08.23 - pt wishes to proceed - RBA of procedure discussed, questions answered - informed consent obtained and signed - see procedure note - Avastin informed consent obtained and signed, 10.18.21 (OS) - Eylea informed consent obtained and signed, 05.31.22 - [facility] # [id number], 07/14/2021 - 07/13/2022  - dec PF/Prolensa to BID OS - F/U 4 weeks, DFE, OCT, possible injection   2,3. Hypertensive retinopathy OU - discussed importance of tight BP control - monitor   4. Age related macular degeneration, non-exudative, both eyes  - The incidence, anatomy, and pathology of dry AMD, risk of progression,  and the AREDS and AREDS 2 study including smoking risks discussed with patient.   - Recommend amsler grid monitoring  5. Pseudophakia OU  - s/p CE/IOL  - IOL in good position, doing well  - monitor   6. Glaucoma Suspect  - IOP 20,21  - discussed the possibility of starting a glaucoma drop in the future    - under the expert care of Dr. Jerline Pain  Ophthalmic Meds Ordered  this visit:  Meds ordered this encounter  Medications   aflibercept (EYLEA) SOLN 2 mg     Return in about 4 weeks (around 01/16/2022) for DFE, OCT, possible injection.  There are no Patient Instructions on file for this visit.  This document serves as a record of services personally performed by Gardiner Sleeper, MD, PhD. It was created on their behalf by San Jetty. Owens Shark, OA an ophthalmic technician. The creation of this record is the provider's dictation and/or activities during the visit.    Electronically signed by: San Jetty. Owens Shark, [place] 05.25.2023 1:34 PM  This document serves as a record of services personally performed by Gardiner Sleeper, MD, PhD. It was created on their behalf by Leonie Douglas, an ophthalmic technician. The creation of this record is the provider's dictation and/or activities during the visit.    Electronically signed by: Leonie Douglas COA, 12/19/21  1:34 PM  Gardiner Sleeper, M.D., Ph.D. Diseases & Surgery of the Retina and Vitreous [facility]  I have reviewed the above documentation for accuracy and completeness, and I agree with the above. Gardiner Sleeper, M.D., Ph.D. 12/19/21 1:34 PM   Abbreviations: M myopia (nearsighted); A astigmatism; H hyperopia (farsighted); P presbyopia; Mrx spectacle prescription;  CTL contact lenses; OD right eye; OS left eye; OU both eyes  XT exotropia; ET esotropia; PEK punctate epithelial keratitis; PEE punctate epithelial erosions; DES dry eye syndrome; MGD meibomian gland dysfunction; ATs artificial tears; PFAT's preservative free artificial tears; [place] nuclear sclerotic cataract; PSC posterior subcapsular cataract; ERM epi-retinal membrane; PVD posterior vitreous detachment; RD retinal detachment; DM diabetes mellitus; DR diabetic retinopathy; NPDR non-proliferative diabetic retinopathy; PDR proliferative diabetic retinopathy; CSME clinically significant macular edema; DME diabetic macular edema; dbh dot blot  hemorrhages; CWS cotton wool spot; POAG primary open angle glaucoma; C/D cup-to-disc ratio; HVF humphrey visual field; GVF goldmann visual field; OCT optical coherence tomography; IOP intraocular pressure; BRVO Branch retinal vein occlusion; CRVO central retinal vein occlusion; CRAO central retinal artery occlusion; BRAO branch retinal artery occlusion; RT retinal tear; SB scleral buckle; PPV pars plana vitrectomy; VH Vitreous hemorrhage; PRP panretinal laser photocoagulation; IVK intravitreal kenalog; VMT vitreomacular traction; MH Macular hole;  NVD neovascularization of the disc; NVE neovascularization elsewhere; AREDS age related eye disease study; ARMD age related macular degeneration; POAG primary open angle glaucoma; EBMD epithelial/anterior basement membrane dystrophy; ACIOL anterior chamber intraocular lens; IOL intraocular lens; PCIOL posterior chamber intraocular lens; Phaco/IOL phacoemulsification with intraocular lens placement; Turner photorefractive keratectomy; LASIK laser assisted in situ keratomileusis; HTN hypertension; DM diabetes mellitus; COPD chronic obstructive pulmonary disease

## 2021-12-13 ENCOUNTER — Other Ambulatory Visit (HOSPITAL_BASED_OUTPATIENT_CLINIC_OR_DEPARTMENT_OTHER): Payer: Self-pay | Admitting: Nurse Practitioner

## 2021-12-13 DIAGNOSIS — I10 Essential (primary) hypertension: Secondary | ICD-10-CM

## 2021-12-19 ENCOUNTER — Ambulatory Visit (HOSPITAL_BASED_OUTPATIENT_CLINIC_OR_DEPARTMENT_OTHER): Payer: Medicare Other | Admitting: Family Medicine

## 2021-12-19 ENCOUNTER — Ambulatory Visit (INDEPENDENT_AMBULATORY_CARE_PROVIDER_SITE_OTHER): Payer: Medicare Other | Admitting: Ophthalmology

## 2021-12-19 ENCOUNTER — Encounter (INDEPENDENT_AMBULATORY_CARE_PROVIDER_SITE_OTHER): Payer: Self-pay | Admitting: Ophthalmology

## 2021-12-19 DIAGNOSIS — H35033 Hypertensive retinopathy, bilateral: Secondary | ICD-10-CM | POA: Diagnosis not present

## 2021-12-19 DIAGNOSIS — Z961 Presence of intraocular lens: Secondary | ICD-10-CM

## 2021-12-19 DIAGNOSIS — I10 Essential (primary) hypertension: Secondary | ICD-10-CM | POA: Diagnosis not present

## 2021-12-19 DIAGNOSIS — H353131 Nonexudative age-related macular degeneration, bilateral, early dry stage: Secondary | ICD-10-CM

## 2021-12-19 DIAGNOSIS — H34832 Tributary (branch) retinal vein occlusion, left eye, with macular edema: Secondary | ICD-10-CM

## 2021-12-19 DIAGNOSIS — H40003 Preglaucoma, unspecified, bilateral: Secondary | ICD-10-CM

## 2021-12-19 MED ORDER — AFLIBERCEPT 2MG/0.05ML IZ SOLN FOR KALEIDOSCOPE
2.0000 mg | INTRAVITREAL | Status: AC | PRN
  Administered 2021-12-19: 2 mg via INTRAVITREAL

## 2021-12-23 ENCOUNTER — Ambulatory Visit (HOSPITAL_BASED_OUTPATIENT_CLINIC_OR_DEPARTMENT_OTHER): Payer: Medicare Other | Admitting: Family Medicine

## 2021-12-23 ENCOUNTER — Encounter (HOSPITAL_BASED_OUTPATIENT_CLINIC_OR_DEPARTMENT_OTHER): Payer: Self-pay | Admitting: Family Medicine

## 2021-12-23 DIAGNOSIS — R600 Localized edema: Secondary | ICD-10-CM | POA: Diagnosis not present

## 2021-12-23 DIAGNOSIS — R000 Tachycardia, unspecified: Secondary | ICD-10-CM | POA: Insufficient documentation

## 2021-12-23 DIAGNOSIS — I4891 Unspecified atrial fibrillation: Secondary | ICD-10-CM | POA: Diagnosis not present

## 2021-12-23 MED ORDER — METOPROLOL SUCCINATE ER 25 MG PO TB24: 25.0000 mg | ORAL_TABLET | Freq: Every day | ORAL | 1 refills | Status: DC

## 2021-12-23 MED ORDER — RIVAROXABAN 20 MG PO TABS: 20.0000 mg | ORAL_TABLET | Freq: Every day | ORAL | 1 refills | Status: DC

## 2021-12-23 NOTE — Progress Notes (Signed)
    Procedures performed today:    EKG: Atrial fibrillation, borderline tachycardia, normal QTc, no T wave abnormality.  Independent interpretation of notes and tests performed by another provider:   None.  Brief History, Exam, Impression, and Recommendations:    BP 132/83   Pulse 94   Ht 5' (1.524 m)   Wt 150 lb (68 kg)   SpO2 99%   BMI 29.29 kg/m   Atrial fibrillation [facility]) Janice Small is a 88 year old female with a past medical history of hypertension, hypothyroidism, hyperparathyroidism.  She is presenting today for evaluation of bilateral lower extremity swelling.  She does not have any discomfort or pain associated with this.  She has not noticed any increase in shortness of breath, no orthopnea.  Denies any issues with chest pain, lightheadedness or dizziness.  Feels that swelling is equal in bilateral lower extremities.  No recent changes in medications, although it seems that she had been on amlodipine at 1 point in the past, but this was discontinued due to suspicion that it was causing some lower extremity edema for patient.  Eventually, this was restarted, she is currently taking this medication at a low dose of 2.5 mg. On exam, patient is slightly hypertensive, heart rate is somewhat higher than it has been at prior office visits with borderline tachycardia present.  Cardiovascular exam with borderline tachycardia, irregularly irregular rhythm.  Lungs clear to auscultation bilaterally.  Bilateral lower extremities with 1+ pitting edema, no tenderness to palpation over calf posteriorly. Given above, concern for underlying cardiac etiology, possible arrhythmia, EKG completed today - EKG with evidence of atrial fibrillation Will also proceed with laboratory evaluation including CBC, CMP, TSH Patient is hemodynamically stable today. Long discussion with patient and family member regarding management. Her CHA2DS2-VASc Score and unadjusted Ischemic Stroke Rate (% per year) is equal to 4.8  % stroke rate/year from a score of 4 - discussed meaning of this and dicussed risks and benefits related to anticoagulation, patient ultimately decided to proceed with anticoagulation at this time. Will also place referral to Cardiology for further evaluation and recommendations.   ___________________________________________ Odessia Asleson de [place], MD, ABFM, CAQSM [facility] and [place]

## 2021-12-23 NOTE — Assessment & Plan Note (Signed)
Janice Small is a 88 year old female with a past medical history of hypertension, hypothyroidism, hyperparathyroidism.  She is presenting today for evaluation of bilateral lower extremity swelling.  She does not have any discomfort or pain associated with this.  She has not noticed any increase in shortness of breath, no orthopnea.  Denies any issues with chest pain, lightheadedness or dizziness.  Feels that swelling is equal in bilateral lower extremities.  No recent changes in medications, although it seems that she had been on amlodipine at 1 point in the past, but this was discontinued due to suspicion that it was causing some lower extremity edema for patient.  Eventually, this was restarted, she is currently taking this medication at a low dose of 2.5 mg. On exam, patient is slightly hypertensive, heart rate is somewhat higher than it has been at prior office visits with borderline tachycardia present.  Cardiovascular exam with borderline tachycardia, irregularly irregular rhythm.  Lungs clear to auscultation bilaterally.  Bilateral lower extremities with 1+ pitting edema, no tenderness to palpation over calf posteriorly. Given above, concern for underlying cardiac etiology, possible arrhythmia, EKG completed today - EKG with evidence of atrial fibrillation Will also proceed with laboratory evaluation including CBC, CMP, TSH Patient is hemodynamically stable today. Long discussion with patient and family member regarding management. Her CHA2DS2-VASc Score and unadjusted Ischemic Stroke Rate (% per year) is equal to 4.8 % stroke rate/year from a score of 4 - discussed meaning of this and dicussed risks and benefits related to anticoagulation, patient ultimately decided to proceed with anticoagulation at this time. Will also place referral to Cardiology for further evaluation and recommendations.

## 2021-12-24 LAB — CBC WITH DIFFERENTIAL/PLATELET
Basophils Absolute: 0.1 10*3/uL (ref 0.0–0.2)
Basos: 1 %
EOS (ABSOLUTE): 0.1 10*3/uL (ref 0.0–0.4)
Eos: 2 %
Hematocrit: 38.6 % (ref 34.0–46.6)
Hemoglobin: 12.7 g/dL (ref 11.1–15.9)
Immature Grans (Abs): 0 10*3/uL (ref 0.0–0.1)
Immature Granulocytes: 0 %
Lymphocytes Absolute: 1.2 10*3/uL (ref 0.7–3.1)
Lymphs: 23 %
MCH: 31.8 pg (ref 26.6–33.0)
MCHC: 32.9 g/dL (ref 31.5–35.7)
MCV: 97 fL (ref 79–97)
Monocytes Absolute: 0.6 10*3/uL (ref 0.1–0.9)
Monocytes: 12 %
Neutrophils Absolute: 3.3 10*3/uL (ref 1.4–7.0)
Neutrophils: 62 %
Platelets: 159 10*3/uL (ref 150–450)
RBC: 3.99 x10E6/uL (ref 3.77–5.28)
RDW: 13.2 % (ref 11.7–15.4)
WBC: 5.3 10*3/uL (ref 3.4–10.8)

## 2021-12-24 LAB — COMPREHENSIVE METABOLIC PANEL
ALT: 38 IU/L — ABNORMAL HIGH (ref 0–32)
AST: 16 IU/L (ref 0–40)
Albumin/Globulin Ratio: 2.9 — ABNORMAL HIGH (ref 1.2–2.2)
Albumin: 4.7 g/dL — ABNORMAL HIGH (ref 3.5–4.6)
Alkaline Phosphatase: 68 IU/L (ref 44–121)
BUN/Creatinine Ratio: 31 — ABNORMAL HIGH (ref 12–28)
BUN: 33 mg/dL (ref 10–36)
Bilirubin Total: 0.4 mg/dL (ref 0.0–1.2)
CO2: 22 mmol/L (ref 20–29)
Calcium: 10.4 mg/dL — ABNORMAL HIGH (ref 8.7–10.3)
Chloride: 102 mmol/L (ref 96–106)
Creatinine, Ser: 1.07 mg/dL — ABNORMAL HIGH (ref 0.57–1.00)
Globulin, Total: 1.6 g/dL (ref 1.5–4.5)
Glucose: 92 mg/dL (ref 70–99)
Potassium: 4 mmol/L (ref 3.5–5.2)
Sodium: 143 mmol/L (ref 134–144)
Total Protein: 6.3 g/dL (ref 6.0–8.5)
eGFR: 49 mL/min/{1.73_m2} — ABNORMAL LOW (ref >59)

## 2021-12-24 LAB — TSH: TSH: 2.72 u[IU]/mL (ref 0.450–4.500)

## 2022-01-08 ENCOUNTER — Other Ambulatory Visit (HOSPITAL_BASED_OUTPATIENT_CLINIC_OR_DEPARTMENT_OTHER): Payer: Self-pay | Admitting: Nurse Practitioner

## 2022-01-08 DIAGNOSIS — E039 Hypothyroidism, unspecified: Secondary | ICD-10-CM

## 2022-01-08 NOTE — Progress Notes (Unsigned)
Cardiology Office Note:    Date:  01/09/2022   ID:  Janice Small, DOB 24-Oct-1927, MRN [id number]  PCP:  [name], NP  Cardiologist:  None  Electrophysiologist:  None   Referring MD: de [place], Blondell Reveal, MD   Chief Complaint  Patient presents with   Atrial Fibrillation    History of Present Illness:    Janice Small is a 88 y.o. female with a hx of hypertension, hypothyroidism who is referred by Dr. de [place] for evaluation of atrial fibrillation.  Noted to be in A-fib at PCP clinic 12/23/2020.  Started on Xarelto 20 mg daily.  She reports that she has been having significant lower extremity, states that she has been putting bandages on her legs because of all the fluid weeping from her legs.  She also reports has been feeling short of breath.  Occurs with minimal exertion such as walking around her house.  Denies any chest pain.  Denies any lightheadedness or syncope.  No bleeding issues on Xarelto.  No smoking history.  Family history includes father had MI in 22s.  Past Medical History:  Diagnosis Date   BREAST BIOPSY, HX OF 11/27/2006   CHF (congestive heart failure) (HCC)    COLONIC POLYPS, HX OF 11/27/2006   HEMORRHOIDS 01/17/2010   HYPERTENSION 11/27/2006   Hypertensive retinopathy    OU   HYPOTHYROIDISM 11/27/2006   Macular degeneration    Dry OU   MENOPAUSAL SYNDROME 11/27/2006   Vascular disease     Past Surgical History:  Procedure Laterality Date   ABDOMINAL HYSTERECTOMY     APPENDECTOMY     BREAST EXCISIONAL BIOPSY Right 1987   BREAST SURGERY     bx   CATARACT EXTRACTION Bilateral    EYE SURGERY Bilateral    Cat Sx OU   HEMORRHOID BANDING     TONSILLECTOMY      Current Medications: Current Meds  Medication Sig   amLODipine (NORVASC) 2.5 MG tablet TAKE 1 TABLET BY MOUTH DAILY   Bromfenac Sodium (PROLENSA) 0.07 % SOLN Place 1 drop into the left eye 4 (four) times daily.   cloNIDine (CATAPRES) 0.1 MG tablet TAKE 1 TABLET BY MOUTH TWICE  DAILY    furosemide (LASIX) 20 MG tablet Take 1 tablet (20 mg total) by mouth daily.   levothyroxine (SYNTHROID) 75 MCG tablet Take 1 tablet (75 mcg total) by mouth daily.   lisinopril (ZESTRIL) 40 MG tablet Take 1 tablet (40 mg total) by mouth daily.   metoprolol succinate (TOPROL-XL) 25 MG 24 hr tablet Take 1 tablet (25 mg total) by mouth daily.   prednisoLONE acetate (PRED FORTE) 1 % ophthalmic suspension Place 1 drop into the left eye 4 (four) times daily.   rivaroxaban (XARELTO) 20 MG TABS tablet Take 1 tablet (20 mg total) by mouth daily with supper.     Allergies:   Codeine phosphate   Social History   Socioeconomic History   Marital status: Widowed    Spouse name: Not on file   Number of children: Not on file   Years of education: Not on file   Highest education level: Not on file  Occupational History   Not on file  Tobacco Use   Smoking status: Never   Smokeless tobacco: Never  Vaping Use   Vaping Use: Never used  Substance and Sexual Activity   Alcohol use: Never    Comment: rarely   Drug use: No   Sexual activity: Not Currently  Other Topics Concern  Not on file  Social History Narrative   The patient is retired she has been in [place] for more than 50 years she has 3 sons that she is widowed.   Never tobacco, no alcohol, 2 caffeinated beverages daily no drugs   Social Determinants of Health   Financial Resource Strain: Not on file  Food Insecurity: Not on file  Transportation Needs: Not on file  Physical Activity: Not on file  Stress: Not on file  Social Connections: Not on file     Family History: The patient's family history includes Heart disease in her father. There is no history of Colon cancer, Esophageal cancer, Pancreatic cancer, Liver cancer, or Stomach cancer.  ROS:   Please see the history of present illness.     All other systems reviewed and are negative.  EKGs/Labs/Other Studies Reviewed:    The following studies were reviewed  today:   EKG:   01/09/22: Afib, rate 93, low voltage, Q wave in V1/2  Recent Labs: 12/23/2021: ALT 38; BUN 33; Creatinine, Ser 1.07; Hemoglobin 12.7; Platelets 159; Potassium 4.0; Sodium 143; TSH 2.720  Recent Lipid Panel    Component Value Date/Time   CHOL 167 11/04/2021 1144   TRIG 58 11/04/2021 1144   HDL 86 11/04/2021 1144   CHOLHDL 1.9 11/04/2021 1144   CHOLHDL 2 12/03/2018 1010   VLDL 18.6 12/03/2018 1010   LDLCALC 69 11/04/2021 1144   LDLDIRECT 104.0 06/17/2013 1046    Physical Exam:    VS:  BP (!) 154/90 (BP Location: Left Arm, Patient Position: Sitting, Cuff Size: Normal)   Pulse 93   Ht 5' (1.524 m)   Wt 150 lb 4.8 oz (68.2 kg)   BMI 29.35 kg/m     Wt Readings from Last 3 Encounters:  01/09/22 150 lb 4.8 oz (68.2 kg)  12/23/21 150 lb (68 kg)  12/02/21 149 lb 9.6 oz (67.9 kg)     GEN:  Well nourished, well developed in no acute distress HEENT: Normal NECK: + JVD; No carotid bruits CARDIAC: Irregular, normal rate no murmurs, rubs, gallops RESPIRATORY:  Clear to auscultation without rales, wheezing or rhonchi  ABDOMEN: Soft, non-tender, non-distended MUSCULOSKELETAL:  3+ edema SKIN: Warm and dry NEUROLOGIC:  Alert and oriented x 3 PSYCHIATRIC:  Normal affect   ASSESSMENT:    1. Heart failure, type unknown ([place])   2. Bilateral lower extremity edema   3. Atrial fibrillation, unspecified type ([place])   4. Essential hypertension    PLAN:     Suspected heart failure: She appears significantly volume overloaded, with JVD and weeping leg edema.  She reports feeling short of breath. -Recommend admission for IV diuresis.  Would check echocardiogram during admission  Atrial fibrillation: New diagnosis 12/23/2021.  Could be contributing to heart failure as above -Continue Xarelto -Continue Toprol-XL 25 mg daily -Monitor on telemetry, check echocardiogram as above  Hypertension: On lisinopril 40 mg daily, Toprol-XL 25 mg daily, clonidine 0.1 mg twice daily,  amlodipine 2.5 mg daily, Lasix 20 mg daily.  BP elevated in clinic today  Hypothyroidism: On levothyroxine 75 mcg daily.  TSH 2.72 on 12/23/2021  RTC in 2-3 weeks  Medication Adjustments/Labs and Tests Ordered: Current medicines are reviewed at length with the patient today.  Concerns regarding medicines are outlined above.  Orders Placed This Encounter  Procedures   EKG 12-Lead   No orders of the defined types were placed in this encounter.   Patient Instructions  Medication Instructions:  Your Physician recommend you continue  on your current medication as directed.    *If you need a refill on your cardiac medications before your next appointment, please call your pharmacy*   Lab Work: None ordered today  If you have labs (blood work) drawn today and your tests are completely normal, you will receive your results only by: [place] (if you have MyChart) OR A paper copy in the mail If you have any lab test that is abnormal or we need to change your treatment, we will call you to review the results.   Testing/Procedures: None ordered today    Follow-Up: At [facility], you and your health needs are our priority.  As part of our continuing mission to provide you with exceptional heart care, we have created designated Provider Care Teams.  These Care Teams include your primary Cardiologist (physician) and Advanced Practice Providers (APPs -  Physician Assistants and Nurse Practitioners) who all work together to provide you with the care you need, when you need it.  We recommend signing up for the patient portal called "MyChart".  Sign up information is provided on this After Visit Summary.  MyChart is used to connect with patients for Virtual Visits (Telemedicine).  Patients are able to view lab/test results, encounter notes, upcoming appointments, etc.  Non-urgent messages can be sent to your provider as well.   To learn more about what you can do with MyChart, go to  [email or web address].    Your next appointment:   2-3 week(s)  The format for your next appointment:   In Person  Provider:   Dr. Gardiner Rhyme  Important Information About Sugar         Signed, Donato Heinz, MD  01/09/2022 1:26 PM    [place]

## 2022-01-09 ENCOUNTER — Encounter (HOSPITAL_BASED_OUTPATIENT_CLINIC_OR_DEPARTMENT_OTHER): Payer: Self-pay | Admitting: Emergency Medicine

## 2022-01-09 ENCOUNTER — Inpatient Hospital Stay (HOSPITAL_BASED_OUTPATIENT_CLINIC_OR_DEPARTMENT_OTHER)
Admission: EM | Admit: 2022-01-09 | Discharge: 2022-01-15 | DRG: 291 | Disposition: A | Discharge status: Home / Self Care | Payer: Medicare Other | Source: Ambulatory Visit | Attending: Internal Medicine | Admitting: Internal Medicine

## 2022-01-09 ENCOUNTER — Ambulatory Visit (INDEPENDENT_AMBULATORY_CARE_PROVIDER_SITE_OTHER): Payer: Medicare Other | Admitting: Cardiology

## 2022-01-09 ENCOUNTER — Emergency Department (HOSPITAL_BASED_OUTPATIENT_CLINIC_OR_DEPARTMENT_OTHER): Payer: Medicare Other | Admitting: Radiology

## 2022-01-09 ENCOUNTER — Other Ambulatory Visit: Payer: Self-pay

## 2022-01-09 VITALS — BP 154/90 | HR 93 | Ht 60.0 in | Wt 150.3 lb

## 2022-01-09 DIAGNOSIS — E876 Hypokalemia: Secondary | ICD-10-CM | POA: Diagnosis present

## 2022-01-09 DIAGNOSIS — I4891 Unspecified atrial fibrillation: Secondary | ICD-10-CM

## 2022-01-09 DIAGNOSIS — H9193 Unspecified hearing loss, bilateral: Secondary | ICD-10-CM | POA: Diagnosis present

## 2022-01-09 DIAGNOSIS — E210 Primary hyperparathyroidism: Secondary | ICD-10-CM | POA: Diagnosis present

## 2022-01-09 DIAGNOSIS — K649 Unspecified hemorrhoids: Secondary | ICD-10-CM | POA: Diagnosis present

## 2022-01-09 DIAGNOSIS — M810 Age-related osteoporosis without current pathological fracture: Secondary | ICD-10-CM | POA: Diagnosis present

## 2022-01-09 DIAGNOSIS — H35039 Hypertensive retinopathy, unspecified eye: Secondary | ICD-10-CM | POA: Diagnosis present

## 2022-01-09 DIAGNOSIS — I110 Hypertensive heart disease with heart failure: Principal | ICD-10-CM | POA: Diagnosis present

## 2022-01-09 DIAGNOSIS — R601 Generalized edema: Secondary | ICD-10-CM | POA: Diagnosis not present

## 2022-01-09 DIAGNOSIS — I444 Left anterior fascicular block: Secondary | ICD-10-CM | POA: Diagnosis present

## 2022-01-09 DIAGNOSIS — R339 Retention of urine, unspecified: Secondary | ICD-10-CM | POA: Diagnosis present

## 2022-01-09 DIAGNOSIS — R600 Localized edema: Secondary | ICD-10-CM | POA: Diagnosis not present

## 2022-01-09 DIAGNOSIS — R338 Other retention of urine: Secondary | ICD-10-CM | POA: Diagnosis not present

## 2022-01-09 DIAGNOSIS — I509 Heart failure, unspecified: Secondary | ICD-10-CM

## 2022-01-09 DIAGNOSIS — E8770 Fluid overload, unspecified: Principal | ICD-10-CM

## 2022-01-09 DIAGNOSIS — I160 Hypertensive urgency: Secondary | ICD-10-CM | POA: Diagnosis present

## 2022-01-09 DIAGNOSIS — I4819 Other persistent atrial fibrillation: Secondary | ICD-10-CM | POA: Diagnosis present

## 2022-01-09 DIAGNOSIS — G8929 Other chronic pain: Secondary | ICD-10-CM | POA: Diagnosis present

## 2022-01-09 DIAGNOSIS — I340 Nonrheumatic mitral (valve) insufficiency: Secondary | ICD-10-CM | POA: Diagnosis present

## 2022-01-09 DIAGNOSIS — Z8249 Family history of ischemic heart disease and other diseases of the circulatory system: Secondary | ICD-10-CM | POA: Diagnosis not present

## 2022-01-09 DIAGNOSIS — I5033 Acute on chronic diastolic (congestive) heart failure: Secondary | ICD-10-CM | POA: Diagnosis present

## 2022-01-09 DIAGNOSIS — I5031 Acute diastolic (congestive) heart failure: Secondary | ICD-10-CM | POA: Diagnosis present

## 2022-01-09 DIAGNOSIS — Z8719 Personal history of other diseases of the digestive system: Secondary | ICD-10-CM | POA: Diagnosis not present

## 2022-01-09 DIAGNOSIS — Z20822 Contact with and (suspected) exposure to covid-19: Secondary | ICD-10-CM | POA: Diagnosis present

## 2022-01-09 DIAGNOSIS — I4811 Longstanding persistent atrial fibrillation: Secondary | ICD-10-CM | POA: Diagnosis not present

## 2022-01-09 DIAGNOSIS — I10 Essential (primary) hypertension: Secondary | ICD-10-CM | POA: Diagnosis not present

## 2022-01-09 DIAGNOSIS — M549 Dorsalgia, unspecified: Secondary | ICD-10-CM | POA: Diagnosis present

## 2022-01-09 DIAGNOSIS — H3530 Unspecified macular degeneration: Secondary | ICD-10-CM | POA: Diagnosis present

## 2022-01-09 DIAGNOSIS — M7989 Other specified soft tissue disorders: Secondary | ICD-10-CM | POA: Diagnosis present

## 2022-01-09 DIAGNOSIS — E039 Hypothyroidism, unspecified: Secondary | ICD-10-CM | POA: Diagnosis present

## 2022-01-09 DIAGNOSIS — Z79890 Hormone replacement therapy (postmenopausal): Secondary | ICD-10-CM

## 2022-01-09 DIAGNOSIS — Z79899 Other long term (current) drug therapy: Secondary | ICD-10-CM

## 2022-01-09 DIAGNOSIS — Z7901 Long term (current) use of anticoagulants: Secondary | ICD-10-CM | POA: Diagnosis not present

## 2022-01-09 DIAGNOSIS — E875 Hyperkalemia: Secondary | ICD-10-CM | POA: Diagnosis not present

## 2022-01-09 DIAGNOSIS — N179 Acute kidney failure, unspecified: Secondary | ICD-10-CM | POA: Diagnosis present

## 2022-01-09 DIAGNOSIS — F419 Anxiety disorder, unspecified: Secondary | ICD-10-CM | POA: Diagnosis present

## 2022-01-09 DIAGNOSIS — I480 Paroxysmal atrial fibrillation: Secondary | ICD-10-CM | POA: Diagnosis not present

## 2022-01-09 HISTORY — DX: Heart failure, unspecified: I50.9

## 2022-01-09 LAB — COMPREHENSIVE METABOLIC PANEL
ALT: 38 U/L (ref 0–44)
ALT: 43 U/L (ref 0–44)
AST: 13 U/L — ABNORMAL LOW (ref 15–41)
AST: 16 U/L (ref 15–41)
Albumin: 4.4 g/dL (ref 3.5–5.0)
Albumin: 4.5 g/dL (ref 3.5–5.0)
Alkaline Phosphatase: 44 U/L (ref 38–126)
Alkaline Phosphatase: 58 U/L (ref 38–126)
Anion gap: 10 (ref 5–15)
Anion gap: 13 (ref 5–15)
BUN: 33 mg/dL — ABNORMAL HIGH (ref 8–23)
BUN: 26 mg/dL — ABNORMAL HIGH (ref 8–23)
CO2: 27 mmol/L (ref 22–32)
CO2: 28 mmol/L (ref 22–32)
Calcium: 10.6 mg/dL — ABNORMAL HIGH (ref 8.9–10.3)
Calcium: 10.5 mg/dL — ABNORMAL HIGH (ref 8.9–10.3)
Chloride: 100 mmol/L (ref 98–111)
Chloride: 101 mmol/L (ref 98–111)
Creatinine, Ser: 1.08 mg/dL — ABNORMAL HIGH (ref 0.44–1.00)
Creatinine, Ser: 1.06 mg/dL — ABNORMAL HIGH (ref 0.44–1.00)
GFR, Estimated: 48 mL/min — ABNORMAL LOW (ref >60)
GFR, Estimated: 49 mL/min — ABNORMAL LOW (ref >60)
Glucose, Bld: 114 mg/dL — ABNORMAL HIGH (ref 70–99)
Glucose, Bld: 145 mg/dL — ABNORMAL HIGH (ref 70–99)
Potassium: 3.6 mmol/L (ref 3.5–5.1)
Potassium: 3.1 mmol/L — ABNORMAL LOW (ref 3.5–5.1)
Sodium: 137 mmol/L (ref 135–145)
Sodium: 142 mmol/L (ref 135–145)
Total Bilirubin: 0.7 mg/dL (ref 0.3–1.2)
Total Bilirubin: 1.4 mg/dL — ABNORMAL HIGH (ref 0.3–1.2)
Total Protein: 6.6 g/dL (ref 6.5–8.1)
Total Protein: 6.8 g/dL (ref 6.5–8.1)

## 2022-01-09 LAB — CBC WITH DIFFERENTIAL/PLATELET
Abs Immature Granulocytes: 0.01 10*3/uL (ref 0.00–0.07)
Basophils Absolute: 0 10*3/uL (ref 0.0–0.1)
Basophils Relative: 1 %
Eosinophils Absolute: 0.1 10*3/uL (ref 0.0–0.5)
Eosinophils Relative: 1 %
HCT: 37.2 % (ref 36.0–46.0)
Hemoglobin: 12.1 g/dL (ref 12.0–15.0)
Immature Granulocytes: 0 %
Lymphocytes Relative: 22 %
Lymphs Abs: 0.9 10*3/uL (ref 0.7–4.0)
MCH: 31.7 pg (ref 26.0–34.0)
MCHC: 32.5 g/dL (ref 30.0–36.0)
MCV: 97.4 fL (ref 80.0–100.0)
Monocytes Absolute: 0.4 10*3/uL (ref 0.1–1.0)
Monocytes Relative: 9 %
Neutro Abs: 2.8 10*3/uL (ref 1.7–7.7)
Neutrophils Relative %: 67 %
Platelets: 150 10*3/uL (ref 150–400)
RBC: 3.82 MIL/uL — ABNORMAL LOW (ref 3.87–5.11)
RDW: 15.3 % (ref 11.5–15.5)
WBC: 4.2 10*3/uL (ref 4.0–10.5)
nRBC: 0 % (ref 0.0–0.2)

## 2022-01-09 LAB — TROPONIN I (HIGH SENSITIVITY)
Troponin I (High Sensitivity): 11 ng/L (ref <18)
Troponin I (High Sensitivity): 10 ng/L (ref <18)

## 2022-01-09 LAB — BRAIN NATRIURETIC PEPTIDE: B Natriuretic Peptide: 376 pg/mL — ABNORMAL HIGH (ref 0.0–100.0)

## 2022-01-09 MED ORDER — FUROSEMIDE 10 MG/ML IJ SOLN
60.0000 mg | Freq: Two times a day (BID) | INTRAMUSCULAR | Status: DC
  Administered 2022-01-09 – 2022-01-12 (×6): 60 mg via INTRAVENOUS
  Filled 2022-01-09 (×6): qty 6

## 2022-01-09 MED ORDER — FUROSEMIDE 10 MG/ML IJ SOLN
60.0000 mg | Freq: Once | INTRAMUSCULAR | Status: AC
  Administered 2022-01-09: 60 mg via INTRAVENOUS
  Filled 2022-01-09: qty 6

## 2022-01-09 MED ORDER — ACETAMINOPHEN 325 MG PO TABS
650.0000 mg | ORAL_TABLET | Freq: Four times a day (QID) | ORAL | Status: DC | PRN
  Administered 2022-01-10 – 2022-01-11 (×3): 650 mg via ORAL
  Filled 2022-01-09 (×3): qty 2

## 2022-01-09 MED ORDER — LABETALOL HCL 5 MG/ML IV SOLN
10.0000 mg | INTRAVENOUS | Status: DC | PRN
Start: 2022-01-09 — End: 2022-01-13
  Administered 2022-01-09 – 2022-01-11 (×2): 10 mg via INTRAVENOUS
  Filled 2022-01-09 (×3): qty 4

## 2022-01-09 MED ORDER — ONDANSETRON HCL 4 MG/2ML IJ SOLN
4.0000 mg | Freq: Four times a day (QID) | INTRAMUSCULAR | Status: DC | PRN
  Administered 2022-01-11 (×2): 4 mg via INTRAVENOUS
  Filled 2022-01-09 (×2): qty 2

## 2022-01-09 MED ORDER — CLONIDINE HCL 0.1 MG PO TABS
0.1000 mg | ORAL_TABLET | Freq: Two times a day (BID) | ORAL | Status: DC
  Administered 2022-01-09 – 2022-01-15 (×12): 0.1 mg via ORAL
  Filled 2022-01-09 (×12): qty 1

## 2022-01-09 MED ORDER — ONDANSETRON HCL 4 MG PO TABS: 4.0000 mg | ORAL_TABLET | Freq: Four times a day (QID) | ORAL | Status: DC | PRN

## 2022-01-09 MED ORDER — LEVOTHYROXINE SODIUM 75 MCG PO TABS
75.0000 ug | ORAL_TABLET | Freq: Every day | ORAL | Status: DC
  Administered 2022-01-10 – 2022-01-15 (×6): 75 ug via ORAL
  Filled 2022-01-09 (×6): qty 1

## 2022-01-09 MED ORDER — LISINOPRIL 20 MG PO TABS
40.0000 mg | ORAL_TABLET | Freq: Every day | ORAL | Status: DC
  Administered 2022-01-10 – 2022-01-13 (×4): 40 mg via ORAL
  Filled 2022-01-09 (×4): qty 2

## 2022-01-09 MED ORDER — METOPROLOL SUCCINATE ER 25 MG PO TB24
25.0000 mg | ORAL_TABLET | Freq: Every day | ORAL | Status: DC
  Administered 2022-01-10: 25 mg via ORAL
  Filled 2022-01-09: qty 1

## 2022-01-09 MED ORDER — ACETAMINOPHEN 650 MG RE SUPP: 650.0000 mg | Freq: Four times a day (QID) | RECTAL | Status: DC | PRN

## 2022-01-09 MED ORDER — AMLODIPINE BESYLATE 2.5 MG PO TABS
2.5000 mg | ORAL_TABLET | Freq: Every day | ORAL | Status: DC
  Administered 2022-01-10: 2.5 mg via ORAL
  Filled 2022-01-09: qty 1

## 2022-01-09 MED ORDER — RIVAROXABAN 20 MG PO TABS
20.0000 mg | ORAL_TABLET | Freq: Every day | ORAL | Status: DC
  Administered 2022-01-09 – 2022-01-10 (×2): 20 mg via ORAL
  Filled 2022-01-09 (×2): qty 1

## 2022-01-09 MED ORDER — ALBUTEROL SULFATE (2.5 MG/3ML) 0.083% IN NEBU: 2.5000 mg | INHALATION_SOLUTION | RESPIRATORY_TRACT | Status: DC | PRN

## 2022-01-09 NOTE — Plan of Care (Signed)
88 year old F with recent diagnosis of A-fib on Xarelto, HTN, hypothyroidism, hypercalcemia, osteoporosis, primary hyperparathyroidism, bilateral hearing loss and hemorrhoids directed to [facility] ED for possible acute CHF.  Recently diagnosed with A-fib and started on metoprolol and Xarelto.  She was referred to cardiology.  She saw Dr. Su Grand today.  She had weeping bilateral edema, increased weight and shortness of breath despite oral Lasix at home, and sent to ED for IV diuretics and further evaluation including echocardiogram.  In ED, slightly hypertensive. Cr 1.08 (about baseline).  BUN 33.  BNP 376.  CBC without significant finding.  Troponin 11.  EKG A-fib without RVR.  CXR with small bilateral pleural effusion and mild cardiomegaly.  IV Lasix 60 mg x 1 ordered.  Hospitalist service called for admission.  Admission accepted to cardiac telemetry at [facility].  Notify cardiology on arrival.

## 2022-01-09 NOTE — ED Provider Notes (Signed)
[facility] EMERGENCY DEPT Provider Note   CSN: [id number] Arrival date & time: 01/09/22  1240     History  Chief Complaint  Patient presents with   Leg Swelling    Janice Small is a 88 y.o. female.  Patient sent here from cardiologist office for admission for IV diuretics.  Recently diagnosed with atrial fibrillation and started on Xarelto and metoprolol.  Heart rate has been improved but she has had significant leg swelling now over the last few weeks since his diagnosis.  Concern is for volume overload likely related to heart failure.  She is on Lasix daily.  She is not having any major respiratory symptoms but she has noticed increased weight gain in her legs here the last few days.  She denies any infectious symptoms.  She denies any chest pain.  She has any history of atrial fibrillation, history of appendectomy in the past, has a history of heart failure, valvular disease, hypertension.  The history is provided by the patient.       Home Medications Prior to Admission medications   Medication Sig Start Date End Date Taking? Authorizing Provider  amLODipine (NORVASC) 2.5 MG tablet TAKE 1 TABLET BY MOUTH DAILY 11/30/21   Early, Coralee Pesa, NP  Bromfenac Sodium (PROLENSA) 0.07 % SOLN Place 1 drop into the left eye 4 (four) times daily. 11/21/21   Bernarda Caffey, MD  cloNIDine (CATAPRES) 0.1 MG tablet TAKE 1 TABLET BY MOUTH TWICE  DAILY 12/17/21   Early, Coralee Pesa, NP  furosemide (LASIX) 20 MG tablet Take 1 tablet (20 mg total) by mouth daily. 07/18/21   [name], NP  levothyroxine (SYNTHROID) 75 MCG tablet Take 1 tablet (75 mcg total) by mouth daily. 07/18/21   [name], NP  lisinopril (ZESTRIL) 40 MG tablet Take 1 tablet (40 mg total) by mouth daily. 08/07/21   [name], NP  metoprolol succinate (TOPROL-XL) 25 MG 24 hr tablet Take 1 tablet (25 mg total) by mouth daily. 12/23/21   de [place], Blondell Reveal, MD  prednisoLONE acetate (PRED FORTE) 1 % ophthalmic suspension  Place 1 drop into the left eye 4 (four) times daily. 11/21/21   Bernarda Caffey, MD  rivaroxaban (XARELTO) 20 MG TABS tablet Take 1 tablet (20 mg total) by mouth daily with supper. 12/23/21   de [place], Raymond J, MD      Allergies    Codeine phosphate    Review of Systems   Review of Systems  Physical Exam Updated Vital Signs BP (!) 157/104   Pulse 99   Temp 98 F (36.7 C)   Resp 15   SpO2 96%  Physical Exam Vitals and nursing note reviewed.  Constitutional:      General: She is not in acute distress.    Appearance: She is well-developed. She is not ill-appearing.  HENT:     Head: Normocephalic and atraumatic.     Nose: Nose normal.     Mouth/Throat:     Mouth: Mucous membranes are moist.  Eyes:     Extraocular Movements: Extraocular movements intact.     Conjunctiva/sclera: Conjunctivae normal.     Pupils: Pupils are equal, round, and reactive to light.  Cardiovascular:     Rate and Rhythm: Normal rate and regular rhythm.     Pulses: Normal pulses.     Heart sounds: Normal heart sounds. No murmur heard. Pulmonary:     Effort: Pulmonary effort is normal. No respiratory distress.  Breath sounds: Rales present.  Abdominal:     Palpations: Abdomen is soft.     Tenderness: There is no abdominal tenderness.  Musculoskeletal:        General: No swelling.     Cervical back: Neck supple.     Right lower leg: Edema present.     Left lower leg: Edema present.     Comments: 3+ pitting edema bilaterally  Skin:    General: Skin is warm and dry.     Capillary Refill: Capillary refill takes less than 2 seconds.  Neurological:     General: No focal deficit present.     Mental Status: She is alert.  Psychiatric:        Mood and Affect: Mood normal.     ED Results / Procedures / Treatments   Labs (all labs ordered are listed, but only abnormal results are displayed) Labs Reviewed  CBC WITH DIFFERENTIAL/PLATELET - Abnormal; Notable for the following components:      Result  Value   RBC 3.82 (*)    All other components within normal limits  COMPREHENSIVE METABOLIC PANEL - Abnormal; Notable for the following components:   Glucose, Bld 114 (*)    BUN 33 (*)    Creatinine, Ser 1.08 (*)    Calcium 10.6 (*)    AST 13 (*)    GFR, Estimated 48 (*)    All other components within normal limits  BRAIN NATRIURETIC PEPTIDE - Abnormal; Notable for the following components:   B Natriuretic Peptide 376.0 (*)    All other components within normal limits  TROPONIN I (HIGH SENSITIVITY)  TROPONIN I (HIGH SENSITIVITY)    EKG EKG Interpretation  Date/Time:  Thursday January 09 2022 14:01:26 EDT Ventricular Rate:  91 PR Interval:    QRS Duration: 86 QT Interval:  400 QTC Calculation: 493 R Axis:   5 Text Interpretation: Atrial fibrillation Low voltage, precordial leads Confirmed by Lennice Sites ([id number]) on 01/09/2022 2:17:52 PM  Radiology DG Chest Port 1 View  Result Date: 01/09/2022 CLINICAL DATA:  History of hypertension hypothyroidism, atrial fibrillation. EXAM: PORTABLE CHEST 1 VIEW COMPARISON:  August 12, 2013. FINDINGS: Cardiomediastinal contours and hilar structures are stable accounting for low lung volumes with signs of mild cardiac enlargement. Low lung volumes accentuate cardiac size as does portable AP projection. No lobar level consolidative changes.  No pneumothorax. Graded opacity in the RIGHT and LEFT chest RIGHT greater than LEFT. Osteopenia. Levoconvex curvature of the spine is similar to the prior study. No acute skeletal process on limited assessment. IMPRESSION: 1. Low lung volumes with basilar airspace disease and potential small effusions RIGHT greater than LEFT. 2. Mild cardiac enlargement. 3. Osteopenia. Electronically Signed   By: Zetta Bills M.D.   On: 01/09/2022 13:47    Procedures Procedures    Medications Ordered in ED Medications  furosemide (LASIX) injection 60 mg (has no administration in time range)    ED Course/ Medical Decision  Making/ A&P                           Medical Decision Making Amount and/or Complexity of Data Reviewed Labs: ordered. Radiology: ordered.  Risk Prescription drug management. Decision regarding hospitalization.   Janice Small is here with volume overload.  Patient with unremarkable vitals.  No fever.  Sent here from cardiology office for admission for IV diuretics.  Recently diagnosed with atrial fibrillation with RVR several weeks ago.  Started on metoprolol  and Xarelto.  Has been on Lasix.  Followed up today for increased leg swelling.  Seems grossly volume overloaded on exam.  She not having any major respiratory symptoms.  But she does have anasarca on exam.  Pitting edema bilaterally at least 3+.  Some mild rales on exam.  She looks comfortable from a respiratory standpoint however.  We will get CBC, BNP, CMP, troponin, EKG, chest x-ray.  Will give a dose IV Lasix 60 mg.  EKG shows rate controlled atrial fibrillation.  No ischemic changes.  Chest x-ray per my review and interpretation shows some volume overload, pleural effusion.  BNP per my review and interpretation of labs is significant at 376.  Troponin is normal.  Creatinine is 1.08.  But otherwise no significant electrolyte abnormality or leukocytosis.  Overall believe she benefit from IV diuretics, echocardiogram.  Since her diagnosis of atrial fibrillation she has not had an echocardiogram.  Will admit to hospitalist for further care.  This chart was dictated using voice recognition software.  Despite best efforts to proofread,  errors can occur which can change the documentation meaning.         Final Clinical Impression(s) / ED Diagnoses Final diagnoses:  Hypervolemia, unspecified hypervolemia type  Anasarca    Rx / DC Orders ED Discharge Orders     None         Lennice Sites, DO 01/09/22 1453

## 2022-01-09 NOTE — Progress Notes (Signed)
Bladder Scan compete  683m pt complained of abdominal pain

## 2022-01-09 NOTE — Patient Instructions (Signed)
Medication Instructions:  Your Physician recommend you continue on your current medication as directed.    *If you need a refill on your cardiac medications before your next appointment, please call your pharmacy*   Lab Work: None ordered today  If you have labs (blood work) drawn today and your tests are completely normal, you will receive your results only by: [place] (if you have MyChart) OR A paper copy in the mail If you have any lab test that is abnormal or we need to change your treatment, we will call you to review the results.   Testing/Procedures: None ordered today    Follow-Up: At [facility], you and your health needs are our priority.  As part of our continuing mission to provide you with exceptional heart care, we have created designated Provider Care Teams.  These Care Teams include your primary Cardiologist (physician) and Advanced Practice Providers (APPs -  Physician Assistants and Nurse Practitioners) who all work together to provide you with the care you need, when you need it.  We recommend signing up for the patient portal called "MyChart".  Sign up information is provided on this After Visit Summary.  MyChart is used to connect with patients for Virtual Visits (Telemedicine).  Patients are able to view lab/test results, encounter notes, upcoming appointments, etc.  Non-urgent messages can be sent to your provider as well.   To learn more about what you can do with MyChart, go to [email or web address].    Your next appointment:   2-3 week(s)  The format for your next appointment:   In Person  Provider:   Dr. Gardiner Rhyme  Important Information About Sugar

## 2022-01-09 NOTE — ED Notes (Signed)
Medical necessity form completed b C. Vick RN w/ Karen Chafe.

## 2022-01-09 NOTE — ED Triage Notes (Signed)
Pt was seen by cardiologist today, told she needed to go to ED and be admitted for IV lasix.

## 2022-01-09 NOTE — H&P (Addendum)
History and Physical    [name] MVH:[id number] DOB: [birth year] DOA: 01/09/2022  PCP: [name], NP  Patient coming from: [facility]  I have personally briefly reviewed patient's old medical records in [place]  Chief Complaint:   HPI: Janice Small is a 88 y.o. female with medical history significant of  A-fib on Xarelto, HTN, hypothyroidism, hypercalcemia, osteoporosis, primary hyperparathyroidism, bilateral hearing loss  who was referred to ED due to symptoms of fluid overload with b/l lower extremity edema, increased weight and shortness of breath despite oral lasix at home. Patient notes she has also had increase sob  but denies chest pain n/v/diarrhea/ or presyncope. She does endorse difficult with passing her urine as well as chronic back pain.   ED Course:  Vitals: afeb, bp 156/98-114, hr 93, sat 98% on ra In ED, slightly hypertensive. Cr 1.08 (about baseline).  BUN 33.  BNP 376.  CBC without significant finding.  Troponin 11.  EKG A-fib without RVR.  CXR with small bilateral pleural effusion and mild cardiomegaly.  IV Lasix 60 mg x 1 ordered Further labs Wbc: 4.2, hgb 12.1 plt150 Bnp 376 CE 11 Na 137/ K3.6 WUX:LKGM cvr Cxr 1. Low lung volumes with basilar airspace disease and potential small effusions RIGHT greater than LEFT. 2. Mild cardiac enlargement. 3. Osteopenia. Review of Systems: As per HPI otherwise 10 point review of systems negative.   Past Medical History:  Diagnosis Date   BREAST BIOPSY, HX OF 11/27/2006   CHF (congestive heart failure) ([place])    COLONIC POLYPS, HX OF 11/27/2006   HEMORRHOIDS 01/17/2010   HYPERTENSION 11/27/2006   Hypertensive retinopathy    OU   HYPOTHYROIDISM 11/27/2006   Macular degeneration    Dry OU   MENOPAUSAL SYNDROME 11/27/2006   Vascular disease     Past Surgical History:  Procedure Laterality Date   ABDOMINAL HYSTERECTOMY     APPENDECTOMY     BREAST EXCISIONAL BIOPSY Right 1987   BREAST SURGERY      bx   CATARACT EXTRACTION Bilateral    EYE SURGERY Bilateral    Cat Sx OU   HEMORRHOID BANDING     TONSILLECTOMY       reports that she has never smoked. She has never used smokeless tobacco. She reports that she does not drink alcohol and does not use drugs.  Allergies  Allergen Reactions   Codeine Phosphate     REACTION: unspecified    Family History  Problem Relation Age of Onset   Heart disease Father    Colon cancer Neg Hx    Esophageal cancer Neg Hx    Pancreatic cancer Neg Hx    Liver cancer Neg Hx    Stomach cancer Neg Hx     Prior to Admission medications   Medication Sig Start Date End Date Taking? Authorizing Provider  amLODipine (NORVASC) 2.5 MG tablet TAKE 1 TABLET BY MOUTH DAILY 11/30/21  Yes Early, Coralee Pesa, NP  Bromfenac Sodium (PROLENSA) 0.07 % SOLN Place 1 drop into the left eye 4 (four) times daily. 11/21/21  Yes Bernarda Caffey, MD  cloNIDine (CATAPRES) 0.1 MG tablet TAKE 1 TABLET BY MOUTH TWICE  DAILY 12/17/21  Yes Early, Coralee Pesa, NP  furosemide (LASIX) 20 MG tablet Take 1 tablet (20 mg total) by mouth daily. 07/18/21  Yes Early, Coralee Pesa, NP  levothyroxine (SYNTHROID) 75 MCG tablet Take 1 tablet (75 mcg total) by mouth daily. 07/18/21  Yes Early, Coralee Pesa, NP  lisinopril (ZESTRIL) 40  MG tablet Take 1 tablet (40 mg total) by mouth daily. 08/07/21  Yes Early, Coralee Pesa, NP  MAGNESIUM PO Take 1 tablet by mouth daily.   Yes [provider]  metoprolol succinate (TOPROL-XL) 25 MG 24 hr tablet Take 1 tablet (25 mg total) by mouth daily. 12/23/21  Yes de [place], Raymond J, MD  prednisoLONE acetate (PRED FORTE) 1 % ophthalmic suspension Place 1 drop into the left eye 4 (four) times daily. 11/21/21  Yes Bernarda Caffey, MD  rivaroxaban (XARELTO) 20 MG TABS tablet Take 1 tablet (20 mg total) by mouth daily with supper. 12/23/21  Yes de [place], Raymond J, MD  VITAMIN D PO Take 1 tablet by mouth daily.   Yes [provider]    Physical Exam: Vitals:   01/09/22 1530 01/09/22  1545 01/09/22 1600 01/09/22 1717  BP: (!) 163/115  (!) 183/105 (!) 169/127  Pulse: 97 89 (!) 103   Resp: 20 20 (!) 33   Temp:    97.6 F (36.4 C)  TempSrc:    Oral  SpO2: 94% 98% 97%   Weight:    67.6 kg  Height:    5' (1.524 m)     Vitals:   01/09/22 1530 01/09/22 1545 01/09/22 1600 01/09/22 1717  BP: (!) 163/115  (!) 183/105 (!) 169/127  Pulse: 97 89 (!) 103   Resp: 20 20 (!) 33   Temp:    97.6 F (36.4 C)  TempSrc:    Oral  SpO2: 94% 98% 97%   Weight:    67.6 kg  Height:    5' (1.524 m)  Constitutional: NAD, calm, comfortable Eyes: PERRL, lids and conjunctivae normal ENMT: Mucous membranes are moist. Posterior pharynx clear of any exudate or lesions.Normal dentition.  Neck: normal, supple, no masses, no thyromegaly Respiratory:  no wheezing, faint + crackles. Normal respiratory effort. No accessory muscle use.  Cardiovascular: Regular rate and rhythm, no murmurs / rubs / gallops. +2-3 extremity edema. 2+ pedal pulses.  Abdomen: no tenderness, no masses palpated. No hepatosplenomegaly. Bowel sounds positive.  Musculoskeletal: no clubbing / cyanosis. No joint deformity upper and lower extremities. Good ROM, no contractures. Normal muscle tone.  Skin: no rashes, lesions, ulcers. No induration, early stasis change on b/l lower extremity  Neurologic: CN 2-12 grossly intact. Sensation intact, Strength 5/5 in all 4.  Psychiatric: Normal judgment and insight. Alert and oriented x 3. Normal mood.    Labs on Admission: I have personally reviewed following labs and imaging studies  CBC: Recent Labs  Lab 01/09/22 1354  WBC 4.2  NEUTROABS 2.8  HGB 12.1  HCT 37.2  MCV 97.4  PLT 627   Basic Metabolic Panel: Recent Labs  Lab 01/09/22 1354  NA 137  K 3.6  CL 100  CO2 27  GLUCOSE 114*  BUN 33*  CREATININE 1.08*  CALCIUM 10.6*   GFR: Estimated Creatinine Clearance: 28.5 mL/min (A) (by C-G formula based on SCr of 1.08 mg/dL (H)). Liver Function Tests: Recent Labs   Lab 01/09/22 1354  AST 13*  ALT 38  ALKPHOS 44  BILITOT 0.7  PROT 6.6  ALBUMIN 4.4   No results for input(s): "LIPASE", "AMYLASE" in the last 168 hours. No results for input(s): "AMMONIA" in the last 168 hours. Coagulation Profile: No results for input(s): "INR", "PROTIME" in the last 168 hours. Cardiac Enzymes: No results for input(s): "CKTOTAL", "CKMB", "CKMBINDEX", "TROPONINI" in the last 168 hours. BNP (last 3 results) No results for input(s): "PROBNP" in the  last 8760 hours. HbA1C: No results for input(s): "HGBA1C" in the last 72 hours. CBG: No results for input(s): "GLUCAP" in the last 168 hours. Lipid Profile: No results for input(s): "CHOL", "HDL", "LDLCALC", "TRIG", "CHOLHDL", "LDLDIRECT" in the last 72 hours. Thyroid Function Tests: No results for input(s): "TSH", "T4TOTAL", "FREET4", "T3FREE", "THYROIDAB" in the last 72 hours. Anemia Panel: No results for input(s): "VITAMINB12", "FOLATE", "FERRITIN", "TIBC", "IRON", "RETICCTPCT" in the last 72 hours. Urine analysis:    Component Value Date/Time   LABSPEC 1.010 03/21/2019 1456   PHURINE 7.0 03/21/2019 1456   GLUCOSEU NEGATIVE 03/21/2019 1456   HGBUR TRACE (A) 03/21/2019 1456   BILIRUBINUR NEGATIVE 03/21/2019 1456   BILIRUBINUR n 01/01/2015 1622   KETONESUR NEGATIVE 03/21/2019 1456   PROTEINUR NEGATIVE 03/21/2019 1456   UROBILINOGEN 0.2 03/21/2019 1456   NITRITE NEGATIVE 03/21/2019 1456   LEUKOCYTESUR NEGATIVE 03/21/2019 1456    Radiological Exams on Admission: DG Chest Port 1 View  Result Date: 01/09/2022 CLINICAL DATA:  History of hypertension hypothyroidism, atrial fibrillation. EXAM: PORTABLE CHEST 1 VIEW COMPARISON:  August 12, 2013. FINDINGS: Cardiomediastinal contours and hilar structures are stable accounting for low lung volumes with signs of mild cardiac enlargement. Low lung volumes accentuate cardiac size as does portable AP projection. No lobar level consolidative changes.  No pneumothorax.  Graded opacity in the RIGHT and LEFT chest RIGHT greater than LEFT. Osteopenia. Levoconvex curvature of the spine is similar to the prior study. No acute skeletal process on limited assessment. IMPRESSION: 1. Low lung volumes with basilar airspace disease and potential small effusions RIGHT greater than LEFT. 2. Mild cardiac enlargement. 3. Osteopenia. Electronically Signed   By: Zetta Bills M.D.   On: 01/09/2022 13:47    EKG: Independently reviewed.  Assessment/Plan  Acute on Chronic CHF  type unknow -admit cardiac tele -lasix iv bid  -echo in am  -cardiology consult for new onset heart failure    Uncontrolled Hypertension / HTN Urgency  -resume home anti-hypertensive lisinopril 40 mg daily, Toprol-XL 25 mg daily, clonidine 0.1 mg twice daily, amlodipine 2.5 mg daily - lasix  iv - prn labetalol prn   Urinary retention  Fullness/tenderness suprapubic area - had 600 on bladder scan  - urinated 1000 per r/n  prior intertervention  - check ua , bladder scan q4-6h   Afib  -continue metoprolol -continue xarelto   Hypothyrodism -resume synthroid    primary hyperparathyroidism -calcium stable at 10.6 -continue to monitor  DVT prophylaxis: xarelto  Code Status: full Family Communication:   Disposition Plan: patient  expected to be admitted greater than 2 midnights  Consults called: cardiology  Admission status: inpatient    Clance Boll MD [facility]   If 7PM-7AM, please contact night-coverage [email or web address] Password [facility]  01/09/2022, 5:52 PM

## 2022-01-10 ENCOUNTER — Inpatient Hospital Stay (HOSPITAL_COMMUNITY): Payer: Medicare Other

## 2022-01-10 DIAGNOSIS — E8770 Fluid overload, unspecified: Secondary | ICD-10-CM | POA: Diagnosis not present

## 2022-01-10 DIAGNOSIS — R601 Generalized edema: Secondary | ICD-10-CM

## 2022-01-10 DIAGNOSIS — I5031 Acute diastolic (congestive) heart failure: Secondary | ICD-10-CM

## 2022-01-10 DIAGNOSIS — R338 Other retention of urine: Secondary | ICD-10-CM | POA: Diagnosis not present

## 2022-01-10 DIAGNOSIS — I509 Heart failure, unspecified: Secondary | ICD-10-CM | POA: Diagnosis not present

## 2022-01-10 DIAGNOSIS — I480 Paroxysmal atrial fibrillation: Secondary | ICD-10-CM

## 2022-01-10 DIAGNOSIS — I10 Essential (primary) hypertension: Secondary | ICD-10-CM

## 2022-01-10 LAB — BASIC METABOLIC PANEL
Anion gap: 11 (ref 5–15)
BUN: 24 mg/dL — ABNORMAL HIGH (ref 8–23)
CO2: 29 mmol/L (ref 22–32)
Calcium: 9.6 mg/dL (ref 8.9–10.3)
Chloride: 103 mmol/L (ref 98–111)
Creatinine, Ser: 1.07 mg/dL — ABNORMAL HIGH (ref 0.44–1.00)
GFR, Estimated: 49 mL/min — ABNORMAL LOW (ref >60)
Glucose, Bld: 121 mg/dL — ABNORMAL HIGH (ref 70–99)
Potassium: 2.8 mmol/L — ABNORMAL LOW (ref 3.5–5.1)
Sodium: 143 mmol/L (ref 135–145)

## 2022-01-10 LAB — ECHOCARDIOGRAM COMPLETE
Area-P 1/2: 6.15 cm2
Calc EF: 53 %
Height: 60 in
MV M vel: 5.04 m/s
MV Peak grad: 101.6 mmHg
S' Lateral: 3.3 cm
Single Plane A2C EF: 54.8 %
Single Plane A4C EF: 51.6 %
Weight: 2257.6 oz

## 2022-01-10 LAB — CBC
HCT: 35.3 % — ABNORMAL LOW (ref 36.0–46.0)
Hemoglobin: 11.8 g/dL — ABNORMAL LOW (ref 12.0–15.0)
MCH: 32.2 pg (ref 26.0–34.0)
MCHC: 33.4 g/dL (ref 30.0–36.0)
MCV: 96.2 fL (ref 80.0–100.0)
Platelets: 146 10*3/uL — ABNORMAL LOW (ref 150–400)
RBC: 3.67 MIL/uL — ABNORMAL LOW (ref 3.87–5.11)
RDW: 15.2 % (ref 11.5–15.5)
WBC: 4.7 10*3/uL (ref 4.0–10.5)
nRBC: 0 % (ref 0.0–0.2)

## 2022-01-10 LAB — GLUCOSE, CAPILLARY: Glucose-Capillary: 107 mg/dL — ABNORMAL HIGH (ref 70–99)

## 2022-01-10 LAB — POTASSIUM: Potassium: 3.3 mmol/L — ABNORMAL LOW (ref 3.5–5.1)

## 2022-01-10 LAB — MAGNESIUM: Magnesium: 1.8 mg/dL (ref 1.7–2.4)

## 2022-01-10 MED ORDER — METOPROLOL SUCCINATE ER 50 MG PO TB24
50.0000 mg | ORAL_TABLET | Freq: Every day | ORAL | Status: DC
  Administered 2022-01-11 – 2022-01-13 (×3): 50 mg via ORAL
  Filled 2022-01-10 (×4): qty 1

## 2022-01-10 MED ORDER — DICLOFENAC SODIUM 1 % EX GEL
2.0000 g | Freq: Three times a day (TID) | CUTANEOUS | Status: DC
  Administered 2022-01-10 – 2022-01-15 (×10): 2 g via TOPICAL
  Filled 2022-01-10: qty 100

## 2022-01-10 MED ORDER — POTASSIUM CHLORIDE CRYS ER 20 MEQ PO TBCR
40.0000 meq | EXTENDED_RELEASE_TABLET | ORAL | Status: AC
  Administered 2022-01-10 (×2): 40 meq via ORAL
  Filled 2022-01-10 (×2): qty 2

## 2022-01-10 MED ORDER — METOPROLOL SUCCINATE ER 25 MG PO TB24
25.0000 mg | ORAL_TABLET | Freq: Once | ORAL | Status: AC
  Administered 2022-01-10: 25 mg via ORAL
  Filled 2022-01-10: qty 1

## 2022-01-10 MED ORDER — HYDROCORTISONE ACETATE 25 MG RE SUPP
25.0000 mg | Freq: Two times a day (BID) | RECTAL | Status: DC
  Administered 2022-01-10 – 2022-01-12 (×2): 25 mg via RECTAL
  Filled 2022-01-10 (×11): qty 1

## 2022-01-10 NOTE — Progress Notes (Signed)
[facility]                                                                              Janice Small, is a 88 y.o. female, DOB - 04-13-1928, HQI:[id number] Admit date - 01/09/2022    Outpatient Primary MD for the patient is Early, Coralee Pesa, NP  LOS - 1  days  Chief Complaint  Patient presents with   Leg Swelling       Brief summary   Patient is a 88 year old female with A-fib on Xarelto, HTN, hypothyroidism, hypercalcemia, osteoporosis, primary hyperparathyroidism presented with symptoms of fluid overload, bilateral lower leg swelling, increased weight gain and shortness of breath despite oral Lasix at home.  Denied any chest pain, nausea vomiting or diarrhea.  She also reported difficulty passing urine and chronic back pain. In ED, BP was somewhat elevated 156/98-114, heart rate 93.  Creatinine 1.08, BNP 376. Chest x-ray with small bilateral pleural effusions and mild cardiomegaly.  Received Lasix 60 mg IV x1.   Assessment & Plan    Principal Problem:   Acute CHF (congestive heart failure) ([place]), HFpEF -Patient was seen in cardiology office and was recommended admission for IV diuresis due to volume overload.  Chest x-ray showed small bilateral pleural effusions, BNP 376. -Received Lasix 60 mg IV x1 in ED, continue Lasix 60 mg IV twice daily -Strict I's and O's and daily weights.  Negative balance of 2.2 L -Weight 6/29 149lbs-> 141 today -2D echo today showed EF of 60 to 65%, no regional WMA, right ventricular systolic function normal, mild to moderate MR. -Continue IV diuresis, monitor renal function, cardiology following   Active Problems:   Hypothyroidism -On levothyroxine, TSH 2.72 on 6/12    Essential hypertension -BP uncontrolled, on lisinopril, Toprol-XL, clonidine, amlodipine -Currently on IV Lasix 60 mg twice daily -Increase Toprol-XL to 50 mg daily  Hypokalemia -Potassium 2.8.  Magnesium 1.8  -replaced p.o., will recheck later    Primary  hyperparathyroidism ([facility]) -Stable, calcium 9.6  Paroxysmal atrial fibrillation ([facility]) -New onset, rate controlled, continue beta-blocker, Xarelto 20 mg daily    Acute urinary retention Improved  Code Status: Full CODE STATUS DVT Prophylaxis:   rivaroxaban (XARELTO) tablet 20 mg   Level of Care: Level of care: Telemetry Cardiac Family Communication: Updated patient  Disposition Plan:      Remains inpatient appropriate: On IV Lasix for diuresis   Procedures:  2D echo  Consultants:   Cardiology      Medications  amLODipine  2.5 mg Oral Daily   cloNIDine  0.1 mg Oral BID   furosemide  60 mg Intravenous Q12H   levothyroxine  75 mcg Oral Q0600   lisinopril  40 mg Oral Daily   metoprolol succinate  25 mg Oral Daily   potassium chloride  40 mEq Oral Q4H   rivaroxaban  20 mg Oral Q supper      Subjective:   Janice Small was seen and examined today.  Feeling somewhat better today, still has lower extremity swelling.  Patient denies dizziness, chest pain,  abdominal pain, N/V/D/C, new weakness, numbess, tingling. No acute events overnight.    Objective:  Vitals:   01/09/22 2345 01/10/22 0437 01/10/22 0510 01/10/22 0736  BP: 140/87 (!) 177/129 (!) 157/95   Pulse:  (!) 102 99   Resp: '20 20 20 20  '$ Temp: 98.7 F (37.1 C) 98.7 F (37.1 C) 98.7 F (37.1 C) (!) 97.4 F (36.3 C)  TempSrc: Oral Oral Oral Oral  SpO2: 97% 95%    Weight:  64 kg    Height:  5' (1.524 m)      Intake/Output Summary (Last 24 hours) at 01/10/2022 1203 Last data filed at 01/10/2022 6222 Gross per 24 hour  Intake 720 ml  Output 2925 ml  Net -2205 ml     Wt Readings from Last 3 Encounters:  01/10/22 64 kg  01/09/22 68.2 kg  12/23/21 68 kg     Exam General: Alert and oriented x 3, NAD Cardiovascular: S1 S2 auscultated,  RRR Respiratory: Diminished breath sound at the bases Gastrointestinal: Soft, nontender, nondistended, + bowel sounds Ext: 2+ pedal edema bilaterally Neuro:  Strength 5/5 upper and lower extremities bilaterally Psych: Normal affect and demeanor, alert and oriented x3     Data Reviewed:  I have personally reviewed following labs    CBC Lab Results  Component Value Date   WBC 4.7 01/10/2022   RBC 3.67 (L) 01/10/2022   HGB 11.8 (L) 01/10/2022   HCT 35.3 (L) 01/10/2022   MCV 96.2 01/10/2022   MCH 32.2 01/10/2022   PLT 146 (L) 01/10/2022   MCHC 33.4 01/10/2022   RDW 15.2 01/10/2022   LYMPHSABS 0.9 01/09/2022   MONOABS 0.4 01/09/2022   EOSABS 0.1 01/09/2022   BASOSABS 0.0 97/98/9211     Last metabolic panel Lab Results  Component Value Date   NA 143 01/10/2022   K 2.8 (L) 01/10/2022   CL 103 01/10/2022   CO2 29 01/10/2022   BUN 24 (H) 01/10/2022   CREATININE 1.07 (H) 01/10/2022   GLUCOSE 121 (H) 01/10/2022   GFRNONAA 49 (L) 01/10/2022   GFRAA 66 09/23/2018   CALCIUM 9.6 01/10/2022   PROT 6.8 01/09/2022   ALBUMIN 4.5 01/09/2022   LABGLOB 1.6 12/23/2021   AGRATIO 2.9 (H) 12/23/2021   BILITOT 1.4 (H) 01/09/2022   ALKPHOS 58 01/09/2022   AST 16 01/09/2022   ALT 43 01/09/2022   ANIONGAP 11 01/10/2022    CBG (last 3)  Recent Labs    01/10/22 0605  GLUCAP 107*      Coagulation Profile: No results for input(s): "INR", "PROTIME" in the last 168 hours.   Radiology Studies: I have personally reviewed the imaging studies  ECHOCARDIOGRAM COMPLETE  Result Date: 01/10/2022 IMPRESSIONS  1. Left ventricular ejection fraction, by estimation, is 60 to 65%. The left ventricle has normal function. The left ventricle has no regional wall motion abnormalities. Left ventricular diastolic parameters are indeterminate.  2. Right ventricular systolic function is normal. The right ventricular size is normal. There is normal pulmonary artery systolic pressure.  3. Left atrial size was moderately dilated.  4. The mitral valve is abnormal. Mild to moderate mitral valve regurgitation. No evidence of mitral stenosis.  5. The aortic valve is  tricuspid. There is mild calcification of the aortic valve. Aortic valve regurgitation is not visualized. Aortic valve sclerosis is present, with no evidence of aortic valve stenosis.  6. The inferior vena cava is normal in size with greater than 50% respiratory variability, suggesting right atrial pressure of 3 mmHg. Jenkins Rouge MD Electronically signed by Jenkins Rouge MD Signature Date/Time: 01/10/2022/10:04:28 AM  Final    DG Chest Port 1 View  Result Date: 01/09/2022 IMPRESSION: 1. Low lung volumes with basilar airspace disease and potential small effusions RIGHT greater than LEFT. 2. Mild cardiac enlargement. 3. Osteopenia. Electronically Signed   By: Zetta Bills M.D.   On: 01/09/2022 13:47       Litzy Dicker M.D. [facility] 01/10/2022, 12:03 PM  Available via Epic secure chat 7am-7pm After 7 pm, please refer to night coverage provider listed on amion.

## 2022-01-10 NOTE — Progress Notes (Signed)
Mobility Specialist Progress Note:   01/10/22 1557  Mobility  Activity Ambulated with assistance in room  Level of Assistance Standby assist, set-up cues, supervision of patient - no hands on  Assistive Device Front wheel walker  Distance Ambulated (ft) 60 ft  Activity Response Tolerated well  $Mobility charge 1 Mobility   Pt received in bed willing to participate in mobility. Complaints of occasional leg cramps. Left in bed with call bell in reach and all needs met.   [facility] Theodor Mustin Mobility Specialist

## 2022-01-10 NOTE — Progress Notes (Signed)
  Echocardiogram 2D Echocardiogram has been performed.  Janice Small M 01/10/2022, 9:55 AM

## 2022-01-10 NOTE — Progress Notes (Signed)
Progress Note  Patient Name: Janice Small Date of Encounter: 01/10/2022  [facility] Cardiologist: None Dr. Oswaldo Milian  Subjective   Janice Small feels well.  She's comfortable  Inpatient Medications    Scheduled Meds:  amLODipine  2.5 mg Oral Daily   cloNIDine  0.1 mg Oral BID   furosemide  60 mg Intravenous Q12H   levothyroxine  75 mcg Oral Q0600   lisinopril  40 mg Oral Daily   metoprolol succinate  25 mg Oral Daily   potassium chloride  40 mEq Oral Q4H   rivaroxaban  20 mg Oral Q supper   Continuous Infusions:  PRN Meds: acetaminophen **OR** acetaminophen, albuterol, labetalol, ondansetron **OR** ondansetron (ZOFRAN) IV   Vital Signs    Vitals:   01/09/22 2345 01/10/22 0437 01/10/22 0510 01/10/22 0736  BP: 140/87 (!) 177/129 (!) 157/95   Pulse:  (!) 102 99   Resp: '20 20 20 20  '$ Temp: 98.7 F (37.1 C) 98.7 F (37.1 C) 98.7 F (37.1 C) (!) 97.4 F (36.3 C)  TempSrc: Oral Oral Oral Oral  SpO2: 97% 95%    Weight:  64 kg    Height:  5' (1.524 m)      Intake/Output Summary (Last 24 hours) at 01/10/2022 1059 Last data filed at 01/10/2022 0852 Gross per 24 hour  Intake 720 ml  Output 2925 ml  Net -2205 ml      01/10/2022    4:37 AM 01/09/2022    5:17 PM 01/09/2022   11:19 AM  Last 3 Weights  Weight (lbs) 141 lb 1.6 oz 149 lb 0.5 oz 150 lb 4.8 oz  Weight (kg) 64.003 kg 67.6 kg 68.176 kg      Telemetry    Afib rate controlled - Personally Reviewed  ECG    NA - Personally Reviewed  Physical Exam   Vitals:   01/10/22 0510 01/10/22 0736  BP: (!) 157/95   Pulse: 99   Resp: 20 20  Temp: 98.7 F (37.1 C) (!) 97.4 F (36.3 C)  SpO2:      GEN: No acute distress.   Neck: No sig JVD Cardiac: RRR, no murmurs, rubs, or gallops.  Respiratory: nl wob, mild decrease BS GI: Soft, nontender, non-distended  MS: 1+-2+ pitting edema BL Neuro:  Nonfocal  Psych: Normal affect   Labs    High Sensitivity Troponin:   Recent Labs  Lab  01/09/22 1354 01/09/22 1500  TROPONINIHS 11 10     Chemistry Recent Labs  Lab 01/09/22 1354 01/09/22 1831 01/10/22 0212  NA 137 142 143  K 3.6 3.1* 2.8*  CL 100 101 103  CO2 '27 28 29  '$ GLUCOSE 114* 145* 121*  BUN 33* 26* 24*  CREATININE 1.08* 1.06* 1.07*  CALCIUM 10.6* 10.5* 9.6  MG  --   --  1.8  PROT 6.6 6.8  --   ALBUMIN 4.4 4.5  --   AST 13* 16  --   ALT 38 43  --   ALKPHOS 44 58  --   BILITOT 0.7 1.4*  --   GFRNONAA 48* 49* 49*  ANIONGAP '10 13 11    '$ Lipids No results for input(s): "CHOL", "TRIG", "HDL", "LABVLDL", "LDLCALC", "CHOLHDL" in the last 168 hours.  Hematology Recent Labs  Lab 01/09/22 1354 01/10/22 0212  WBC 4.2 4.7  RBC 3.82* 3.67*  HGB 12.1 11.8*  HCT 37.2 35.3*  MCV 97.4 96.2  MCH 31.7 32.2  MCHC 32.5 33.4  RDW 15.3 15.2  PLT 150 146*   Thyroid No results for input(s): "TSH", "FREET4" in the last 168 hours.  BNP Recent Labs  Lab 01/09/22 1354  BNP 376.0*    DDimer No results for input(s): "DDIMER" in the last 168 hours.   Radiology    ECHOCARDIOGRAM COMPLETE  Result Date: 01/10/2022    ECHOCARDIOGRAM REPORT   Patient Name:   Janice Small Date of Exam: 01/10/2022 Medical Rec #:  [id number]       Height:       60.0 in Accession #:    [id number]      Weight:       141.1 lb Date of Birth:  May 28, 1928        BSA:          1.609 m Patient Age:    88 years        BP:           157/95 mmHg Patient Gender: F               HR:           80 bpm. Exam Location:  Inpatient Procedure: 2D Echo, Cardiac Doppler and Color Doppler Indications:    CHF-Acute Diastolic W54.62  History:        Patient has no prior history of Echocardiogram examinations.                 CHF; Risk Factors:Hypertension. Lower extremity edema.  Sonographer:    Darlina Sicilian RDCS Referring Phys: [id number] [place]  1. Left ventricular ejection fraction, by estimation, is 60 to 65%. The left ventricle has normal function. The left ventricle has no regional wall  motion abnormalities. Left ventricular diastolic parameters are indeterminate.  2. Right ventricular systolic function is normal. The right ventricular size is normal. There is normal pulmonary artery systolic pressure.  3. Left atrial size was moderately dilated.  4. The mitral valve is abnormal. Mild to moderate mitral valve regurgitation. No evidence of mitral stenosis.  5. The aortic valve is tricuspid. There is mild calcification of the aortic valve. Aortic valve regurgitation is not visualized. Aortic valve sclerosis is present, with no evidence of aortic valve stenosis.  6. The inferior vena cava is normal in size with greater than 50% respiratory variability, suggesting right atrial pressure of 3 mmHg. FINDINGS  Left Ventricle: Left ventricular ejection fraction, by estimation, is 60 to 65%. The left ventricle has normal function. The left ventricle has no regional wall motion abnormalities. The left ventricular internal cavity size was normal in size. There is  no left ventricular hypertrophy. Left ventricular diastolic parameters are indeterminate. Right Ventricle: The right ventricular size is normal. No increase in right ventricular wall thickness. Right ventricular systolic function is normal. There is normal pulmonary artery systolic pressure. The tricuspid regurgitant velocity is 2.25 m/s, and  with an assumed right atrial pressure of 3 mmHg, the estimated right ventricular systolic pressure is 38.1 mmHg. Left Atrium: Left atrial size was moderately dilated. Right Atrium: Right atrial size was normal in size. Pericardium: There is no evidence of pericardial effusion. Mitral Valve: The mitral valve is abnormal. There is mild thickening of the mitral valve leaflet(s). Mild to moderate mitral valve regurgitation. No evidence of mitral valve stenosis. Tricuspid Valve: The tricuspid valve is normal in structure. Tricuspid valve regurgitation is mild . No evidence of tricuspid stenosis. Aortic Valve: The  aortic valve is tricuspid. There is mild calcification of the aortic valve. Aortic valve  regurgitation is not visualized. Aortic valve sclerosis is present, with no evidence of aortic valve stenosis. Pulmonic Valve: The pulmonic valve was normal in structure. Pulmonic valve regurgitation is mild. No evidence of pulmonic stenosis. Aorta: The aortic root is normal in size and structure. Venous: The inferior vena cava is normal in size with greater than 50% respiratory variability, suggesting right atrial pressure of 3 mmHg. IAS/Shunts: No atrial level shunt detected by color flow Doppler.  LEFT VENTRICLE PLAX 2D LVIDd:         4.50 cm     Diastology LVIDs:         3.30 cm     LV e' medial:    6.58 cm/s LV PW:         1.00 cm     LV E/e' medial:  10.7 LV IVS:        1.00 cm     LV e' lateral:   8.58 cm/s LVOT diam:     2.10 cm     LV E/e' lateral: 8.2 LV SV:         26 LV SV Index:   16 LVOT Area:     3.46 cm  LV Volumes (MOD) LV vol d, MOD A2C: 66.6 ml LV vol d, MOD A4C: 73.7 ml LV vol s, MOD A2C: 30.1 ml LV vol s, MOD A4C: 35.7 ml LV SV MOD A2C:     36.5 ml LV SV MOD A4C:     73.7 ml LV SV MOD BP:      37.0 ml RIGHT VENTRICLE RV S prime:     13.50 cm/s TAPSE (M-mode): 1.6 cm LEFT ATRIUM              Index        RIGHT ATRIUM           Index LA diam:        4.20 cm  2.61 cm/m   RA Area:     13.50 cm LA Vol (A2C):   110.0 ml 68.35 ml/m  RA Volume:   20.90 ml  12.99 ml/m LA Vol (A4C):   121.0 ml 75.18 ml/m LA Biplane Vol: 119.0 ml 73.94 ml/m  AORTIC VALVE             PULMONIC VALVE LVOT Vmax:   59.00 cm/s  PR End Diast Vel: 5.95 msec LVOT Vmean:  34.200 cm/s LVOT VTI:    0.076 m  AORTA Ao Root diam: 3.00 cm Ao Asc diam:  3.30 cm MITRAL VALVE               TRICUSPID VALVE MV Area (PHT): 6.15 cm    TR Peak grad:   20.2 mmHg MV Decel Time: 123 msec    TR Vmax:        225.00 cm/s MR Peak grad: 101.6 mmHg MR Mean grad: 51.0 mmHg    SHUNTS MR Vmax:      504.00 cm/s  Systemic VTI:  0.08 m MR Vmean:     298.0 cm/s    Systemic Diam: 2.10 cm MV E velocity: 70.60 cm/s Jenkins Rouge MD Electronically signed by Jenkins Rouge MD Signature Date/Time: 01/10/2022/10:04:28 AM    Final    DG Chest Port 1 View  Result Date: 01/09/2022 CLINICAL DATA:  History of hypertension hypothyroidism, atrial fibrillation. EXAM: PORTABLE CHEST 1 VIEW COMPARISON:  August 12, 2013. FINDINGS: Cardiomediastinal contours and hilar structures are stable accounting for low lung volumes with signs of mild cardiac  enlargement. Low lung volumes accentuate cardiac size as does portable AP projection. No lobar level consolidative changes.  No pneumothorax. Graded opacity in the RIGHT and LEFT chest RIGHT greater than LEFT. Osteopenia. Levoconvex curvature of the spine is similar to the prior study. No acute skeletal process on limited assessment. IMPRESSION: 1. Low lung volumes with basilar airspace disease and potential small effusions RIGHT greater than LEFT. 2. Mild cardiac enlargement. 3. Osteopenia. Electronically Signed   By: Zetta Bills M.D.   On: 01/09/2022 13:47    Cardiac Studies   TTE 01/10/2022  1. Left ventricular ejection fraction, by estimation, is 60 to 65%. The  left ventricle has normal function. The left ventricle has no regional  wall motion abnormalities. Left ventricular diastolic parameters are  indeterminate.   2. Right ventricular systolic function is normal. The right ventricular  size is normal. There is normal pulmonary artery systolic pressure.   3. Left atrial size was moderately dilated.   4. The mitral valve is abnormal. Mild to moderate mitral valve  regurgitation. No evidence of mitral stenosis.   5. The aortic valve is tricuspid. There is mild calcification of the  aortic valve. Aortic valve regurgitation is not visualized. Aortic valve  sclerosis is present, with no evidence of aortic valve stenosis.   6. The inferior vena cava is normal in size with greater than 50%  respiratory variability, suggesting  right atrial pressure of 3 mmHg.   Patient Profile     Janice Small is a 88 y.o. female with a hx of hypertension, hypothyroidism who is referred by Dr. de [place] for evaluation of atrial fibrillation.    Assessment & Plan    HFpEF:She was admitted from Dr. Newman Nickels office for IV diuresis.  E/e' is not significantly elevated. Normal IVC.  BNP 376. Cxray shows small effusions. She is respirating well, she's improving. She continues to have pitting edema will need another day or so of IV diuresis. Crt is 1.07. - continue lasix 60 mg IV BID  Paroxysmal Atrial Fibrillation: in the setting of LE edema and VO. New onset, diagnosed by PCP 6/12/ - rate controlled metop XL 25 mg daily - continue xarelto 20 mg daily  Hypertension: Home meds-On lisinopril 40 mg daily, Toprol-XL 25 mg daily, clonidine 0.1 mg twice daily, amlodipine 2.5 mg daily; will continue. IV diuresis  Hypothyroidism: On levothyroxine 75 mcg daily.  TSH 2.72 on 12/23/2021   For questions or updates, please contact [place] Please consult [email or web address] for contact info under        Signed, Janina Mayo, MD  01/10/2022, 10:59 AM

## 2022-01-10 NOTE — Progress Notes (Signed)
HOSPITAL MEDICINE OVERNIGHT EVENT NOTE    Notified by nursing the patient is complaining of midline back pain.  Ports that this back pain is actually chronic and she typically takes ibuprofen at home.  Chart reviewed, patient is currently being managed for acute congestive heart failure and is also on anticoagulation at this point for atrial fibrillation.  Seeing as how NSAIDs are typically used with caution in both of these conditions we will place patient on diclofenac gel to the thoracic and lumbar spine as systemic absorption is limited with this modality.  We will monitor for response.  Vernelle Emerald  MD [facility]

## 2022-01-10 NOTE — Progress Notes (Signed)
Pt voices some relief with the prn topical medication ordered but pt continue to have anxiety. Pt anxious about her heart and if we can see her rhythm ok if something is to go wrong with her. Pt reassured and RN to continue to closely monitor pt. Delia Heady RN

## 2022-01-10 NOTE — Progress Notes (Signed)
Heart Failure Navigator Progress Note  Assessed for Heart & Vascular TOC clinic readiness.  Patient does not meet criteria due to EF 60-65% , established cardiologist appointment follow up on 01/23/22 with Dr. Gardiner Rhyme.     Earnestine Leys, BSN, Clinical cytogeneticist Only

## 2022-01-10 NOTE — TOC Progression Note (Signed)
Transition of Care [facility]) - Progression Note    Patient Details  Name: Janice Small MRN: [id number] Date of Birth: 05-23-1928  Transition of Care [facility]) CM/SW Contact  Zenon Mayo, RN Phone Number: 01/10/2022, 4:47 PM  Clinical Narrative:    home alone, CHF, iv lasix, echo pending.  TOC will continue to follow for dc needs.         Expected Discharge Plan and Services                                                 Social Determinants of Health (SDOH) Interventions    Readmission Risk Interventions     No data to display

## 2022-01-10 NOTE — Plan of Care (Signed)
  Problem: Clinical Measurements: Goal: Ability to maintain clinical measurements within normal limits will improve Outcome: Progressing Goal: Will remain free from infection Outcome: Progressing Goal: Respiratory complications will improve Outcome: Progressing Goal: Cardiovascular complication will be avoided Outcome: Progressing   Problem: Activity: Goal: Risk for activity intolerance will decrease Outcome: Progressing   

## 2022-01-11 ENCOUNTER — Inpatient Hospital Stay (HOSPITAL_COMMUNITY): Payer: Medicare Other

## 2022-01-11 DIAGNOSIS — R338 Other retention of urine: Secondary | ICD-10-CM | POA: Diagnosis not present

## 2022-01-11 DIAGNOSIS — I480 Paroxysmal atrial fibrillation: Secondary | ICD-10-CM | POA: Diagnosis not present

## 2022-01-11 DIAGNOSIS — I5033 Acute on chronic diastolic (congestive) heart failure: Secondary | ICD-10-CM | POA: Diagnosis not present

## 2022-01-11 DIAGNOSIS — R601 Generalized edema: Secondary | ICD-10-CM | POA: Diagnosis not present

## 2022-01-11 LAB — GLUCOSE, CAPILLARY: Glucose-Capillary: 104 mg/dL — ABNORMAL HIGH (ref 70–99)

## 2022-01-11 LAB — BASIC METABOLIC PANEL
Anion gap: 11 (ref 5–15)
BUN: 23 mg/dL (ref 8–23)
CO2: 26 mmol/L (ref 22–32)
Calcium: 9.4 mg/dL (ref 8.9–10.3)
Chloride: 103 mmol/L (ref 98–111)
Creatinine, Ser: 1.08 mg/dL — ABNORMAL HIGH (ref 0.44–1.00)
GFR, Estimated: 48 mL/min — ABNORMAL LOW (ref >60)
Glucose, Bld: 112 mg/dL — ABNORMAL HIGH (ref 70–99)
Potassium: 3.5 mmol/L (ref 3.5–5.1)
Sodium: 140 mmol/L (ref 135–145)

## 2022-01-11 MED ORDER — AMLODIPINE BESYLATE 5 MG PO TABS
5.0000 mg | ORAL_TABLET | Freq: Every day | ORAL | Status: DC
Start: 2022-01-11 — End: 2022-01-13
  Administered 2022-01-11 – 2022-01-13 (×3): 5 mg via ORAL
  Filled 2022-01-11 (×3): qty 1

## 2022-01-11 MED ORDER — TRAMADOL HCL 50 MG PO TABS
50.0000 mg | ORAL_TABLET | Freq: Four times a day (QID) | ORAL | Status: DC | PRN
  Filled 2022-01-11: qty 1

## 2022-01-11 MED ORDER — TRAMADOL HCL 50 MG PO TABS
50.0000 mg | ORAL_TABLET | Freq: Two times a day (BID) | ORAL | Status: DC
  Administered 2022-01-11: 50 mg via ORAL

## 2022-01-11 MED ORDER — DAPAGLIFLOZIN PROPANEDIOL 10 MG PO TABS
10.0000 mg | ORAL_TABLET | Freq: Every day | ORAL | Status: DC
  Administered 2022-01-11 – 2022-01-15 (×5): 10 mg via ORAL
  Filled 2022-01-11 (×5): qty 1

## 2022-01-11 MED ORDER — LORAZEPAM 0.5 MG PO TABS
0.5000 mg | ORAL_TABLET | Freq: Three times a day (TID) | ORAL | Status: DC | PRN
Start: 2022-01-11 — End: 2022-01-15
  Administered 2022-01-12 – 2022-01-15 (×4): 0.5 mg via ORAL
  Filled 2022-01-11 (×4): qty 1

## 2022-01-11 MED ORDER — SODIUM CHLORIDE 0.9 % IV SOLN
12.5000 mg | Freq: Once | INTRAVENOUS | Status: AC
  Administered 2022-01-11: 12.5 mg via INTRAVENOUS
  Filled 2022-01-11 (×2): qty 0.5

## 2022-01-11 MED ORDER — POTASSIUM CHLORIDE CRYS ER 20 MEQ PO TBCR
40.0000 meq | EXTENDED_RELEASE_TABLET | Freq: Two times a day (BID) | ORAL | Status: DC
  Administered 2022-01-11 – 2022-01-13 (×6): 40 meq via ORAL
  Filled 2022-01-11 (×6): qty 2

## 2022-01-11 MED ORDER — RIVAROXABAN 15 MG PO TABS
15.0000 mg | ORAL_TABLET | Freq: Every day | ORAL | Status: DC
  Administered 2022-01-11 – 2022-01-14 (×4): 15 mg via ORAL
  Filled 2022-01-11 (×4): qty 1

## 2022-01-11 MED ORDER — TRAMADOL HCL 50 MG PO TABS: 50.0000 mg | ORAL_TABLET | Freq: Two times a day (BID) | ORAL | Status: DC

## 2022-01-11 NOTE — Progress Notes (Signed)
PT Cancellation Note  Patient Details Name: Janice Small MRN: [id number] DOB: 1928/05/02   Cancelled Treatment:    Reason Eval/Treat Not Completed: Other (comment). Pt reports "I really don't feel well. I think I got way to much BP medicine this morning." BP taken 105/80, pt states "that's way way to low." Pt declined OOB mobility due to not feeling well. Acute PT to return as able to complete PT eval.  Kittie Plater, PT, DPT Acute Rehabilitation Services Secure chat preferred Office #: [phone]    Janice Small 01/11/2022, 2:15 PM

## 2022-01-11 NOTE — Progress Notes (Signed)
[facility]                                                                              Janice Small, is a 88 y.o. female, DOB - [birth year], MPN:[id number] Admit date - 01/09/2022    Outpatient Primary MD for the patient is Early, Janice Pesa, NP  LOS - 2  days  Chief Complaint  Patient presents with   Leg Swelling       Brief summary   Patient is a 88 year old female with A-fib on Xarelto, HTN, hypothyroidism, hypercalcemia, osteoporosis, primary hyperparathyroidism presented with symptoms of fluid overload, bilateral lower leg swelling, increased weight gain and shortness of breath despite oral Lasix at home.  Denied any chest pain, nausea vomiting or diarrhea.  She also reported difficulty passing urine and chronic back pain. In ED, BP was somewhat elevated 156/98-114, heart rate 93.  Creatinine 1.08, BNP 376. Chest x-ray with small bilateral pleural effusions and mild cardiomegaly.  Received Lasix 60 mg IV x1.   Assessment & Plan    Principal Problem:   Acute CHF (congestive heart failure) ([place]), HFpEF -Patient was seen in cardiology office and was recommended admission for IV diuresis due to volume overload.  Chest x-ray showed small bilateral pleural effusions, BNP 376. -Continue IV Lasix 60 mg every 12 hours, negative balance of 2.5 L -Strict I's and O's and daily weights. Weight 6/29 149lbs-> 139.5 -2D echo today showed EF of 60 to 65%, no regional WMA, right ventricular systolic function normal, mild to moderate MR. -Monitor renal function, cardiology following   Active Problems:   Hypothyroidism -Continue levothyroxine, TSH 2.72 on 6/12    Essential hypertension -BP still uncontrolled, 170/110  -Continue IV Lasix, Norvasc increased, continue Toprol-XL, lisinopril, clonidine  Hypokalemia -Stable, replace as needed    Primary hyperparathyroidism (H[facility]) -Stable,  Paroxysmal atrial fibrillation ([place]) -New onset, rate controlled, continue  beta-blocker, Xarelto 20 mg daily    Acute urinary retention Improved  Chronic back pain and anxiety -Complaining of back pain, was taking ibuprofen at home.  Discussed about cons of NSAIDs with ongoing issues -Started on tramadol 50 mg scheduled twice daily, low-dose Ativan p.o. as needed -Air mattress  Hemorrhoids -Continue Anusol  Code Status: Full CODE STATUS DVT Prophylaxis:   rivaroxaban (XARELTO) tablet 20 mg   Level of Care: Level of care: Telemetry Cardiac Family Communication: Updated patient  Disposition Plan:      Remains inpatient appropriate: On IV Lasix for diuresis.  Still volume overloaded, likely DC next 1 to 4 to 40 hours.   Procedures:  2D echo  Consultants:   Cardiology  Medications  amLODipine  5 mg Oral Daily   cloNIDine  0.1 mg Oral BID   dapagliflozin propanediol  10 mg Oral Daily   diclofenac Sodium  2 g Topical TID   furosemide  60 mg Intravenous Q12H   hydrocortisone  25 mg Rectal BID   levothyroxine  75 mcg Oral Q0600   lisinopril  40 mg Oral Daily   metoprolol succinate  50 mg Oral Daily   rivaroxaban  20 mg Oral Q supper   traMADol  50 mg  Oral BID      Subjective:   Janice Small was seen and examined today.  Main complaint is the chronic back pain and has been taking ibuprofen at home.  Otherwise feeling better, no significant shortness of breath, peripheral edema is improving.     Patient denies dizziness, chest pain,  abdominal pain, N/V/D/C, new weakness, numbess, tingling. No acute events overnight.    Objective:   Vitals:   01/10/22 1918 01/11/22 0516 01/11/22 0522 01/11/22 0725  BP: (!) 109/57 (!) 178/117  (!) 178/110  Pulse:    84  Resp: '17 17  18  '$ Temp: 97.6 F (36.4 C) 98.5 F (36.9 C)  97.8 F (36.6 C)  TempSrc: Oral Oral  Oral  SpO2:  94%  95%  Weight:   63.3 kg   Height:        Intake/Output Summary (Last 24 hours) at 01/11/2022 1112 Last data filed at 01/11/2022 0900 Gross per 24 hour  Intake 740 ml   Output 1050 ml  Net -310 ml     Wt Readings from Last 3 Encounters:  01/11/22 63.3 kg  01/09/22 68.2 kg  12/23/21 68 kg   Physical Exam General: Alert and oriented x 3, NAD Cardiovascular: S1 S2 clear, RRR.  Respiratory: CTAB, no wheezing Gastrointestinal: Soft, nontender, nondistended, NBS Ext: 1+ pedal edema bilaterally, improving, legs wrapped Neuro: no new deficits Psych: Normal affect and demeanor, alert and oriented x3     Data Reviewed:  I have personally reviewed following labs    CBC Lab Results  Component Value Date   WBC 4.7 01/10/2022   RBC 3.67 (L) 01/10/2022   HGB 11.8 (L) 01/10/2022   HCT 35.3 (L) 01/10/2022   MCV 96.2 01/10/2022   MCH 32.2 01/10/2022   PLT 146 (L) 01/10/2022   MCHC 33.4 01/10/2022   RDW 15.2 01/10/2022   LYMPHSABS 0.9 01/09/2022   MONOABS 0.4 01/09/2022   EOSABS 0.1 01/09/2022   BASOSABS 0.0 02/72/5366     Last metabolic panel Lab Results  Component Value Date   NA 140 01/11/2022   K 3.5 01/11/2022   CL 103 01/11/2022   CO2 26 01/11/2022   BUN 23 01/11/2022   CREATININE 1.08 (H) 01/11/2022   GLUCOSE 112 (H) 01/11/2022   GFRNONAA 48 (L) 01/11/2022   GFRAA 66 09/23/2018   CALCIUM 9.4 01/11/2022   PROT 6.8 01/09/2022   ALBUMIN 4.5 01/09/2022   LABGLOB 1.6 12/23/2021   AGRATIO 2.9 (H) 12/23/2021   BILITOT 1.4 (H) 01/09/2022   ALKPHOS 58 01/09/2022   AST 16 01/09/2022   ALT 43 01/09/2022   ANIONGAP 11 01/11/2022    CBG (last 3)  Recent Labs    01/10/22 0605 01/11/22 0607  GLUCAP 107* 104*      Coagulation Profile: No results for input(s): "INR", "PROTIME" in the last 168 hours.   Radiology Studies: I have personally reviewed the imaging studies  ECHOCARDIOGRAM COMPLETE  Result Date: 01/10/2022 IMPRESSIONS  1. Left ventricular ejection fraction, by estimation, is 60 to 65%. The left ventricle has normal function. The left ventricle has no regional wall motion abnormalities. Left ventricular diastolic  parameters are indeterminate.  2. Right ventricular systolic function is normal. The right ventricular size is normal. There is normal pulmonary artery systolic pressure.  3. Left atrial size was moderately dilated.  4. The mitral valve is abnormal. Mild to moderate mitral valve regurgitation. No evidence of mitral stenosis.  5. The aortic valve is tricuspid. There is mild  calcification of the aortic valve. Aortic valve regurgitation is not visualized. Aortic valve sclerosis is present, with no evidence of aortic valve stenosis.  6. The inferior vena cava is normal in size with greater than 50% respiratory variability, suggesting right atrial pressure of 3 mmHg. Jenkins Rouge MD Electronically signed by Jenkins Rouge MD Signature Date/Time: 01/10/2022/10:04:28 AM    Final    DG Chest Port 1 View  Result Date: 01/09/2022 IMPRESSION: 1. Low lung volumes with basilar airspace disease and potential small effusions RIGHT greater than LEFT. 2. Mild cardiac enlargement. 3. Osteopenia. Electronically Signed   By: Zetta Bills M.D.   On: 01/09/2022 13:47       Alivia Cimino M.D. [facility] 01/11/2022, 11:12 AM  Available via Epic secure chat 7am-7pm After 7 pm, please refer to night coverage provider listed on amion.

## 2022-01-11 NOTE — Progress Notes (Signed)
Progress Note  Patient Name: Janice Small Date of Encounter: 01/11/2022  Primary Cardiologist: Donato Heinz, MD  Subjective   Has had some back pain recently.  No chest pain or palpitations.  Feels like her leg swelling has gotten somewhat better.  Inpatient Medications    Scheduled Meds:  amLODipine  5 mg Oral Daily   cloNIDine  0.1 mg Oral BID   diclofenac Sodium  2 g Topical TID   furosemide  60 mg Intravenous Q12H   hydrocortisone  25 mg Rectal BID   levothyroxine  75 mcg Oral Q0600   lisinopril  40 mg Oral Daily   metoprolol succinate  50 mg Oral Daily   rivaroxaban  20 mg Oral Q supper   traMADol  50 mg Oral BID    PRN Meds: acetaminophen **OR** acetaminophen, albuterol, labetalol, LORazepam, ondansetron **OR** ondansetron (ZOFRAN) IV   Vital Signs    Vitals:   01/10/22 1918 01/11/22 0516 01/11/22 0522 01/11/22 0725  BP: (!) 109/57 (!) 178/117  (!) 178/110  Pulse:    84  Resp: '17 17  18  '$ Temp: 97.6 F (36.4 C) 98.5 F (36.9 C)  97.8 F (36.6 C)  TempSrc: Oral Oral  Oral  SpO2:  94%  95%  Weight:   63.3 kg   Height:        Intake/Output Summary (Last 24 hours) at 01/11/2022 1028 Last data filed at 01/11/2022 0900 Gross per 24 hour  Intake 740 ml  Output 1050 ml  Net -310 ml   Filed Weights   01/09/22 1717 01/10/22 0437 01/11/22 0522  Weight: 67.6 kg 64 kg 63.3 kg    Telemetry    Atrial fibrillation.  Personally reviewed.  ECG    An ECG dated 01/09/2022 was personally reviewed today and demonstrated:  Atrial fibrillation with nonspecific T wave changes.  Physical Exam   GEN: No acute distress.   Neck: No JVD. Cardiac: Irregularly irregular, 2/6 systolic murmur.  Respiratory: Nonlabored. Clear to auscultation bilaterally. GI: Soft, nontender, bowel sounds present. MS: Lower legs dressed. Neuro:  Nonfocal. Psych: Alert and oriented x 3. Normal affect.  Labs    Chemistry Recent Labs  Lab 01/09/22 1354 01/09/22 1831  01/10/22 0212 01/10/22 1259 01/11/22 0407  NA 137 142 143  --  140  K 3.6 3.1* 2.8* 3.3* 3.5  CL 100 101 103  --  103  CO2 '27 28 29  '$ --  26  GLUCOSE 114* 145* 121*  --  112*  BUN 33* 26* 24*  --  23  CREATININE 1.08* 1.06* 1.07*  --  1.08*  CALCIUM 10.6* 10.5* 9.6  --  9.4  PROT 6.6 6.8  --   --   --   ALBUMIN 4.4 4.5  --   --   --   AST 13* 16  --   --   --   ALT 38 43  --   --   --   ALKPHOS 44 58  --   --   --   BILITOT 0.7 1.4*  --   --   --   GFRNONAA 48* 49* 49*  --  48*  ANIONGAP '10 13 11  '$ --  11     Hematology Recent Labs  Lab 01/09/22 1354 01/10/22 0212  WBC 4.2 4.7  RBC 3.82* 3.67*  HGB 12.1 11.8*  HCT 37.2 35.3*  MCV 97.4 96.2  MCH 31.7 32.2  MCHC 32.5 33.4  RDW 15.3 15.2  PLT  150 146*    Cardiac Enzymes Recent Labs  Lab 01/09/22 1354 01/09/22 1500  TROPONINIHS 11 10    BNP Recent Labs  Lab 01/09/22 1354  BNP 376.0*     Radiology    ECHOCARDIOGRAM COMPLETE  Result Date: 01/10/2022    ECHOCARDIOGRAM REPORT   Patient Name:   Janice Small Date of Exam: 01/10/2022 Medical Rec #:  [id number]       Height:       60.0 in Accession #:    [id number]      Weight:       141.1 lb Date of Birth:  09-06-1936        BSA:          1.609 m Patient Age:    88 years        BP:           157/95 mmHg Patient Gender: F               HR:           80 bpm. Exam Location:  Inpatient Procedure: 2D Echo, Cardiac Doppler and Color Doppler Indications:    CHF-Acute Diastolic D92.42  History:        Patient has no prior history of Echocardiogram examinations.                 CHF; Risk Factors:Hypertension. Lower extremity edema.  Sonographer:    Darlina Sicilian RDCS Referring Phys: [id number] [place]  1. Left ventricular ejection fraction, by estimation, is 60 to 65%. The left ventricle has normal function. The left ventricle has no regional wall motion abnormalities. Left ventricular diastolic parameters are indeterminate.  2. Right ventricular systolic  function is normal. The right ventricular size is normal. There is normal pulmonary artery systolic pressure.  3. Left atrial size was moderately dilated.  4. The mitral valve is abnormal. Mild to moderate mitral valve regurgitation. No evidence of mitral stenosis.  5. The aortic valve is tricuspid. There is mild calcification of the aortic valve. Aortic valve regurgitation is not visualized. Aortic valve sclerosis is present, with no evidence of aortic valve stenosis.  6. The inferior vena cava is normal in size with greater than 50% respiratory variability, suggesting right atrial pressure of 3 mmHg. FINDINGS  Left Ventricle: Left ventricular ejection fraction, by estimation, is 60 to 65%. The left ventricle has normal function. The left ventricle has no regional wall motion abnormalities. The left ventricular internal cavity size was normal in size. There is  no left ventricular hypertrophy. Left ventricular diastolic parameters are indeterminate. Right Ventricle: The right ventricular size is normal. No increase in right ventricular wall thickness. Right ventricular systolic function is normal. There is normal pulmonary artery systolic pressure. The tricuspid regurgitant velocity is 2.25 m/s, and  with an assumed right atrial pressure of 3 mmHg, the estimated right ventricular systolic pressure is 22.2 mmHg. Left Atrium: Left atrial size was moderately dilated. Right Atrium: Right atrial size was normal in size. Pericardium: There is no evidence of pericardial effusion. Mitral Valve: The mitral valve is abnormal. There is mild thickening of the mitral valve leaflet(s). Mild to moderate mitral valve regurgitation. No evidence of mitral valve stenosis. Tricuspid Valve: The tricuspid valve is normal in structure. Tricuspid valve regurgitation is mild . No evidence of tricuspid stenosis. Aortic Valve: The aortic valve is tricuspid. There is mild calcification of the aortic valve. Aortic valve regurgitation is not  visualized. Aortic valve sclerosis is  present, with no evidence of aortic valve stenosis. Pulmonic Valve: The pulmonic valve was normal in structure. Pulmonic valve regurgitation is mild. No evidence of pulmonic stenosis. Aorta: The aortic root is normal in size and structure. Venous: The inferior vena cava is normal in size with greater than 50% respiratory variability, suggesting right atrial pressure of 3 mmHg. IAS/Shunts: No atrial level shunt detected by color flow Doppler.  LEFT VENTRICLE PLAX 2D LVIDd:         4.50 cm     Diastology LVIDs:         3.30 cm     LV e' medial:    6.58 cm/s LV PW:         1.00 cm     LV E/e' medial:  10.7 LV IVS:        1.00 cm     LV e' lateral:   8.58 cm/s LVOT diam:     2.10 cm     LV E/e' lateral: 8.2 LV SV:         26 LV SV Index:   16 LVOT Area:     3.46 cm  LV Volumes (MOD) LV vol d, MOD A2C: 66.6 ml LV vol d, MOD A4C: 73.7 ml LV vol s, MOD A2C: 30.1 ml LV vol s, MOD A4C: 35.7 ml LV SV MOD A2C:     36.5 ml LV SV MOD A4C:     73.7 ml LV SV MOD BP:      37.0 ml RIGHT VENTRICLE RV S prime:     13.50 cm/s TAPSE (M-mode): 1.6 cm LEFT ATRIUM              Index        RIGHT ATRIUM           Index LA diam:        4.20 cm  2.61 cm/m   RA Area:     13.50 cm LA Vol (A2C):   110.0 ml 68.35 ml/m  RA Volume:   20.90 ml  12.99 ml/m LA Vol (A4C):   121.0 ml 75.18 ml/m LA Biplane Vol: 119.0 ml 73.94 ml/m  AORTIC VALVE             PULMONIC VALVE LVOT Vmax:   59.00 cm/s  PR End Diast Vel: 5.95 msec LVOT Vmean:  34.200 cm/s LVOT VTI:    0.076 m  AORTA Ao Root diam: 3.00 cm Ao Asc diam:  3.30 cm MITRAL VALVE               TRICUSPID VALVE MV Area (PHT): 6.15 cm    TR Peak grad:   20.2 mmHg MV Decel Time: 123 msec    TR Vmax:        225.00 cm/s MR Peak grad: 101.6 mmHg MR Mean grad: 51.0 mmHg    SHUNTS MR Vmax:      504.00 cm/s  Systemic VTI:  0.08 m MR Vmean:     298.0 cm/s   Systemic Diam: 2.10 cm MV E velocity: 70.60 cm/s Jenkins Rouge MD Electronically signed by Jenkins Rouge MD  Signature Date/Time: 01/10/2022/10:04:28 AM    Final    DG Chest Port 1 View  Result Date: 01/09/2022 CLINICAL DATA:  History of hypertension hypothyroidism, atrial fibrillation. EXAM: PORTABLE CHEST 1 VIEW COMPARISON:  August 12, 2013. FINDINGS: Cardiomediastinal contours and hilar structures are stable accounting for low lung volumes with signs of mild cardiac enlargement. Low lung volumes accentuate cardiac size as  does portable AP projection. No lobar level consolidative changes.  No pneumothorax. Graded opacity in the RIGHT and LEFT chest RIGHT greater than LEFT. Osteopenia. Levoconvex curvature of the spine is similar to the prior study. No acute skeletal process on limited assessment. IMPRESSION: 1. Low lung volumes with basilar airspace disease and potential small effusions RIGHT greater than LEFT. 2. Mild cardiac enlargement. 3. Osteopenia. Electronically Signed   By: Zetta Bills M.D.   On: 01/09/2022 13:47    Assessment & Plan    1.  HFpEF with volume overload.  LVEF 60 to 65%.  Recorded urine output incomplete.  Weight is down about a pound last 24 hours.  Currently on IV Lasix, lisinopril, and Toprol-XL.  2.  Mild to moderate mitral regurgitation.  3.  Persistent atrial fibrillation with CHA2DS2-VASc score of 5-6.  She is on Xarelto for stroke prophylaxis and has good heart rate control on Toprol-XL.  4.  Central hypertension, not optimally controlled as yet.  Norvasc uptitrated to 5 mg daily to start today.  Add Farxiga 10 mg daily.  Continue IV diuresis.  No change in current doses of Toprol-XL, lisinopril, clonidine, or Norvasc.  Signed, Rozann Lesches, MD  01/11/2022, 10:28 AM

## 2022-01-11 NOTE — Progress Notes (Signed)
ANTICOAGULATION CONSULT NOTE - Initial Consult  Pharmacy Consult for Rivaroxaban Indication: atrial fibrillation  Allergies  Allergen Reactions   Codeine Phosphate     REACTION: unspecified    Patient Measurements: Height: 5' (152.4 cm) Weight: 63.3 kg (139 lb 8 oz) IBW/kg (Calculated) : 45.5   Vital Signs: Temp: 97.8 F (36.6 C) (07/01 0725) Temp Source: Oral (07/01 0725) BP: 178/110 (07/01 0725) Pulse Rate: 84 (07/01 0725)  Labs: Recent Labs    01/09/22 1354 01/09/22 1500 01/09/22 1831 01/10/22 0212 01/11/22 0407  HGB 12.1  --   --  11.8*  --   HCT 37.2  --   --  35.3*  --   PLT 150  --   --  146*  --   CREATININE 1.08*  --  1.06* 1.07* 1.08*  TROPONINIHS 11 10  --   --   --     Estimated Creatinine Clearance: 27.6 mL/min (A) (by C-G formula based on SCr of 1.08 mg/dL (H)).   Medical History: Past Medical History:  Diagnosis Date   BREAST BIOPSY, HX OF 11/27/2006   CHF (congestive heart failure) (HCC)    COLONIC POLYPS, HX OF 11/27/2006   HEMORRHOIDS 01/17/2010   HYPERTENSION 11/27/2006   Hypertensive retinopathy    OU   HYPOTHYROIDISM 11/27/2006   Macular degeneration    Dry OU   MENOPAUSAL SYNDROME 11/27/2006   Vascular disease      Assessment: 89yof with new Afib cvr 70-80s on Toprol.  Started on rivaroxaban '20mg'$  daily  Cbc stable  Cr 1 - at baseline,  TBW 63kg crcl < 41m/min  Will decrease rivaroxaban to '15mg'$  daily   Goal of Therapy:   Monitor platelets by anticoagulation protocol: Yes   Plan:  Decrease Rivaroxaban '15mg'$  daily  Monitor s/s bleeding    LBonnita NasutiPharm.D. CPP, BCPS Clinical Pharmacist [phone]/07/2021 11:36 AM

## 2022-01-12 DIAGNOSIS — I5033 Acute on chronic diastolic (congestive) heart failure: Secondary | ICD-10-CM | POA: Diagnosis not present

## 2022-01-12 DIAGNOSIS — R601 Generalized edema: Secondary | ICD-10-CM | POA: Diagnosis not present

## 2022-01-12 DIAGNOSIS — R338 Other retention of urine: Secondary | ICD-10-CM | POA: Diagnosis not present

## 2022-01-12 DIAGNOSIS — I509 Heart failure, unspecified: Secondary | ICD-10-CM | POA: Diagnosis not present

## 2022-01-12 DIAGNOSIS — I480 Paroxysmal atrial fibrillation: Secondary | ICD-10-CM | POA: Diagnosis not present

## 2022-01-12 LAB — BASIC METABOLIC PANEL
Anion gap: 9 (ref 5–15)
BUN: 27 mg/dL — ABNORMAL HIGH (ref 8–23)
CO2: 28 mmol/L (ref 22–32)
Calcium: 9.4 mg/dL (ref 8.9–10.3)
Chloride: 103 mmol/L (ref 98–111)
Creatinine, Ser: 1.35 mg/dL — ABNORMAL HIGH (ref 0.44–1.00)
GFR, Estimated: 37 mL/min — ABNORMAL LOW (ref >60)
Glucose, Bld: 101 mg/dL — ABNORMAL HIGH (ref 70–99)
Potassium: 3.7 mmol/L (ref 3.5–5.1)
Sodium: 140 mmol/L (ref 135–145)

## 2022-01-12 LAB — GLUCOSE, CAPILLARY: Glucose-Capillary: 94 mg/dL (ref 70–99)

## 2022-01-12 MED ORDER — LOPERAMIDE HCL 2 MG PO CAPS
2.0000 mg | ORAL_CAPSULE | Freq: Three times a day (TID) | ORAL | Status: DC | PRN
Start: 2022-01-12 — End: 2022-01-15

## 2022-01-12 MED ORDER — LOPERAMIDE HCL 2 MG PO CAPS
4.0000 mg | ORAL_CAPSULE | Freq: Once | ORAL | Status: AC
  Administered 2022-01-12: 4 mg via ORAL
  Filled 2022-01-12: qty 2

## 2022-01-12 MED ORDER — FUROSEMIDE 10 MG/ML IJ SOLN: 60.0000 mg | Freq: Every day | INTRAMUSCULAR | Status: AC

## 2022-01-12 NOTE — Progress Notes (Signed)
Progress Note  Patient Name: Janice Small Date of Encounter: 01/12/2022  Primary Cardiologist: Donato Heinz, MD  Subjective   Feels better today, less nausea.  Inpatient Medications    Scheduled Meds:  amLODipine  5 mg Oral Daily   cloNIDine  0.1 mg Oral BID   dapagliflozin propanediol  10 mg Oral Daily   diclofenac Sodium  2 g Topical TID   furosemide  60 mg Intravenous Q12H   hydrocortisone  25 mg Rectal BID   levothyroxine  75 mcg Oral Q0600   lisinopril  40 mg Oral Daily   metoprolol succinate  50 mg Oral Daily   potassium chloride  40 mEq Oral BID   rivaroxaban  15 mg Oral Q supper    PRN Meds: acetaminophen **OR** acetaminophen, albuterol, labetalol, LORazepam, ondansetron **OR** ondansetron (ZOFRAN) IV   Vital Signs    Vitals:   01/11/22 0725 01/11/22 1133 01/11/22 1952 01/12/22 0536  BP: (!) 178/110 98/64 116/86 (!) 141/86  Pulse: 84 68 71 91  Resp: '18 18 16 14  '$ Temp: 97.8 F (36.6 C) (!) 97.4 F (36.3 C) 98.5 F (36.9 C) 97.9 F (36.6 C)  TempSrc: Oral Oral Oral Oral  SpO2: 95% 97% 95% 93%  Weight:    62.6 kg  Height:        Intake/Output Summary (Last 24 hours) at 01/12/2022 0835 Last data filed at 01/12/2022 0543 Gross per 24 hour  Intake 740 ml  Output 1000 ml  Net -260 ml   Filed Weights   01/10/22 0437 01/11/22 0522 01/12/22 0536  Weight: 64 kg 63.3 kg 62.6 kg    Telemetry    Atrial fibrillation.  Personally reviewed.  ECG    An ECG dated 01/09/2022 was personally reviewed today and demonstrated:  Atrial fibrillation with nonspecific T wave changes.  Physical Exam   GEN: No acute distress.   Neck: No JVD. Cardiac: Irregularly irregular, 2/6 systolic murmur.  Respiratory: Nonlabored. Clear to auscultation bilaterally. GI: Soft, nontender, bowel sounds present. MS: Legs dressed. Neuro:  Nonfocal. Psych: Alert and oriented x 3. Normal affect.  Labs    Chemistry Recent Labs  Lab 01/09/22 1354 01/09/22 1831  01/10/22 0212 01/10/22 1259 01/11/22 0407 01/12/22 0416  NA 137 142 143  --  140 140  K 3.6 3.1* 2.8* 3.3* 3.5 3.7  CL 100 101 103  --  103 103  CO2 '27 28 29  '$ --  26 28  GLUCOSE 114* 145* 121*  --  112* 101*  BUN 33* 26* 24*  --  23 27*  CREATININE 1.08* 1.06* 1.07*  --  1.08* 1.35*  CALCIUM 10.6* 10.5* 9.6  --  9.4 9.4  PROT 6.6 6.8  --   --   --   --   ALBUMIN 4.4 4.5  --   --   --   --   AST 13* 16  --   --   --   --   ALT 38 43  --   --   --   --   ALKPHOS 44 58  --   --   --   --   BILITOT 0.7 1.4*  --   --   --   --   GFRNONAA 48* 49* 49*  --  48* 37*  ANIONGAP '10 13 11  '$ --  11 9     Hematology Recent Labs  Lab 01/09/22 1354 01/10/22 0212  WBC 4.2 4.7  RBC 3.82* 3.67*  HGB 12.1 11.8*  HCT 37.2 35.3*  MCV 97.4 96.2  MCH 31.7 32.2  MCHC 32.5 33.4  RDW 15.3 15.2  PLT 150 146*    Cardiac Enzymes Recent Labs  Lab 01/09/22 1354 01/09/22 1500  TROPONINIHS 11 10    BNP Recent Labs  Lab 01/09/22 1354  BNP 376.0*     Radiology    DG Abd 2 Views  Result Date: 01/11/2022 CLINICAL DATA:  Nausea EXAM: ABDOMEN - 2 VIEW COMPARISON:  None Available. FINDINGS: Nonobstructive bowel gas pattern with air-filled loops of both large and small bowel on the central abdomen. There are a few air-fluid levels on upright view. There is no evidence of free air. No radio-opaque calculi or other significant radiographic abnormality is seen. Scoliotic thoracolumbar curvature with advanced multilevel spondylosis. IMPRESSION: Nonobstructive bowel gas pattern with air-filled loops of both large and small bowel on the central abdomen. There are a few air-fluid levels on upright view which may reflect an enteritis. Electronically Signed   By: Davina Poke D.O.   On: 01/11/2022 16:27   ECHOCARDIOGRAM COMPLETE  Result Date: 01/10/2022    ECHOCARDIOGRAM REPORT   Patient Name:   Janice Small Date of Exam: 01/10/2022 Medical Rec #:  [id number]       Height:       60.0 in Accession #:     [id number]      Weight:       141.1 lb Date of Birth:  11/18/1927        BSA:          1.609 m Patient Age:    88 years        BP:           157/95 mmHg Patient Gender: F               HR:           80 bpm. Exam Location:  Inpatient Procedure: 2D Echo, Cardiac Doppler and Color Doppler Indications:    CHF-Acute Diastolic S50.53  History:        Patient has no prior history of Echocardiogram examinations.                 CHF; Risk Factors:Hypertension. Lower extremity edema.  Sonographer:    Darlina Sicilian RDCS Referring Phys: [id number] [place]  1. Left ventricular ejection fraction, by estimation, is 60 to 65%. The left ventricle has normal function. The left ventricle has no regional wall motion abnormalities. Left ventricular diastolic parameters are indeterminate.  2. Right ventricular systolic function is normal. The right ventricular size is normal. There is normal pulmonary artery systolic pressure.  3. Left atrial size was moderately dilated.  4. The mitral valve is abnormal. Mild to moderate mitral valve regurgitation. No evidence of mitral stenosis.  5. The aortic valve is tricuspid. There is mild calcification of the aortic valve. Aortic valve regurgitation is not visualized. Aortic valve sclerosis is present, with no evidence of aortic valve stenosis.  6. The inferior vena cava is normal in size with greater than 50% respiratory variability, suggesting right atrial pressure of 3 mmHg. FINDINGS  Left Ventricle: Left ventricular ejection fraction, by estimation, is 60 to 65%. The left ventricle has normal function. The left ventricle has no regional wall motion abnormalities. The left ventricular internal cavity size was normal in size. There is  no left ventricular hypertrophy. Left ventricular diastolic parameters are indeterminate. Right Ventricle: The right ventricular size is normal. No  increase in right ventricular wall thickness. Right ventricular systolic function is normal.  There is normal pulmonary artery systolic pressure. The tricuspid regurgitant velocity is 2.25 m/s, and  with an assumed right atrial pressure of 3 mmHg, the estimated right ventricular systolic pressure is 75.6 mmHg. Left Atrium: Left atrial size was moderately dilated. Right Atrium: Right atrial size was normal in size. Pericardium: There is no evidence of pericardial effusion. Mitral Valve: The mitral valve is abnormal. There is mild thickening of the mitral valve leaflet(s). Mild to moderate mitral valve regurgitation. No evidence of mitral valve stenosis. Tricuspid Valve: The tricuspid valve is normal in structure. Tricuspid valve regurgitation is mild . No evidence of tricuspid stenosis. Aortic Valve: The aortic valve is tricuspid. There is mild calcification of the aortic valve. Aortic valve regurgitation is not visualized. Aortic valve sclerosis is present, with no evidence of aortic valve stenosis. Pulmonic Valve: The pulmonic valve was normal in structure. Pulmonic valve regurgitation is mild. No evidence of pulmonic stenosis. Aorta: The aortic root is normal in size and structure. Venous: The inferior vena cava is normal in size with greater than 50% respiratory variability, suggesting right atrial pressure of 3 mmHg. IAS/Shunts: No atrial level shunt detected by color flow Doppler.  LEFT VENTRICLE PLAX 2D LVIDd:         4.50 cm     Diastology LVIDs:         3.30 cm     LV e' medial:    6.58 cm/s LV PW:         1.00 cm     LV E/e' medial:  10.7 LV IVS:        1.00 cm     LV e' lateral:   8.58 cm/s LVOT diam:     2.10 cm     LV E/e' lateral: 8.2 LV SV:         26 LV SV Index:   16 LVOT Area:     3.46 cm  LV Volumes (MOD) LV vol d, MOD A2C: 66.6 ml LV vol d, MOD A4C: 73.7 ml LV vol s, MOD A2C: 30.1 ml LV vol s, MOD A4C: 35.7 ml LV SV MOD A2C:     36.5 ml LV SV MOD A4C:     73.7 ml LV SV MOD BP:      37.0 ml RIGHT VENTRICLE RV S prime:     13.50 cm/s TAPSE (M-mode): 1.6 cm LEFT ATRIUM              Index         RIGHT ATRIUM           Index LA diam:        4.20 cm  2.61 cm/m   RA Area:     13.50 cm LA Vol (A2C):   110.0 ml 68.35 ml/m  RA Volume:   20.90 ml  12.99 ml/m LA Vol (A4C):   121.0 ml 75.18 ml/m LA Biplane Vol: 119.0 ml 73.94 ml/m  AORTIC VALVE             PULMONIC VALVE LVOT Vmax:   59.00 cm/s  PR End Diast Vel: 5.95 msec LVOT Vmean:  34.200 cm/s LVOT VTI:    0.076 m  AORTA Ao Root diam: 3.00 cm Ao Asc diam:  3.30 cm MITRAL VALVE               TRICUSPID VALVE MV Area (PHT): 6.15 cm    TR Peak grad:  20.2 mmHg MV Decel Time: 123 msec    TR Vmax:        225.00 cm/s MR Peak grad: 101.6 mmHg MR Mean grad: 51.0 mmHg    SHUNTS MR Vmax:      504.00 cm/s  Systemic VTI:  0.08 m MR Vmean:     298.0 cm/s   Systemic Diam: 2.10 cm MV E velocity: 70.60 cm/s Jenkins Rouge MD Electronically signed by Jenkins Rouge MD Signature Date/Time: 01/10/2022/10:04:28 AM    Final     Assessment & Plan    1.  HFpEF with volume overload.  LVEF 60 to 65%.  Recorded urine output incomplete.  Weight is down another pound.  Currently on IV Lasix, lisinopril, Farxiga and Toprol-XL.  Creatinine has bumped up to 1.35.  2.  Mild to moderate mitral regurgitation.  3.  Persistent atrial fibrillation with CHA2DS2-VASc score of 5-6.  She is on Xarelto for stroke prophylaxis and has good heart rate control on Toprol-XL.  4.  Essential hypertension, blood pressure trend improving.  Norvasc uptitrated to 5 mg daily yesterday.  Continue lisinopril, Norvasc, Farxiga, Toprol-XL, Xarelto and clonidine.  Decrease Lasix to 60 mg IV once daily.  Suspect she can convert to oral regimen tomorrow and discharge.  Further management can be achieved as outpatient.  Signed, Rozann Lesches, MD  01/12/2022, 8:35 AM

## 2022-01-12 NOTE — Evaluation (Signed)
Physical Therapy Evaluation Patient Details Name: Chelbie Jarnagin MRN: [id number] DOB: 10-May-1928 Today's Date: 01/12/2022  History of Present Illness  88 y.o. female presents to [facility] hospital on 01/09/2022 with BLE swelling, SOB. Pt admitted for acute CHF. PMH includes afib, HTN, osteoporosis.  Clinical Impression  Pt presents to PT with deficits in activity tolerance, balance, and power. Pt electing to utilize RW for mobility today despite use of cane at baseline. With RW pt demonstrates good balance and is able to ambulate for household. Pt will benefit from further assessment of mobility with cane, as well as continued aggressive mobilization in an effort to improve activity tolerance.       Recommendations for follow up therapy are one component of a multi-disciplinary discharge planning process, led by the attending physician.  Recommendations may be updated based on patient status, additional functional criteria and insurance authorization.  Follow Up Recommendations No PT follow up      Assistance Recommended at Discharge PRN  Patient can return home with the following  Help with stairs or ramp for entrance;Assist for transportation;Assistance with cooking/housework    Equipment Recommendations None recommended by PT (pt owns necessary DME)  Recommendations for Other Services       Functional Status Assessment Patient has had a recent decline in their functional status and demonstrates the ability to make significant improvements in function in a reasonable and predictable amount of time.     Precautions / Restrictions Precautions Precautions: Fall Restrictions Weight Bearing Restrictions: No      Mobility  Bed Mobility                    Transfers Overall transfer level: Needs assistance Equipment used: Rolling walker (2 wheels) Transfers: Sit to/from Stand Sit to Stand: Min guard, Supervision           General transfer comment: initially minG, supervision  from commode later    Ambulation/Gait Ambulation/Gait assistance: Supervision Gait Distance (Feet): 150 Feet Assistive device: Rolling walker (2 wheels) Gait Pattern/deviations: Step-through pattern Gait velocity: reduced Gait velocity interpretation: <1.31 ft/sec, indicative of household ambulator   General Gait Details: pt with steady step-through gait with UE support of RW  Stairs            Wheelchair Mobility    Modified Rankin (Stroke Patients Only)       Balance Overall balance assessment: Needs assistance Sitting-balance support: No upper extremity supported, Feet supported Sitting balance-Leahy Scale: Good     Standing balance support: No upper extremity supported, During functional activity Standing balance-Leahy Scale: Fair Standing balance comment: washes hands at sink without UE support                             Pertinent Vitals/Pain Pain Assessment Pain Assessment: Faces Faces Pain Scale: Hurts little more Pain Location: stomach Pain Descriptors / Indicators: Aching Pain Intervention(s): Monitored during session    Home Living Family/patient expects to be discharged to:: Private residence Living Arrangements: Alone Available Help at Discharge: Family;Available PRN/intermittently (sons) Type of Home: House Home Access: Stairs to enter Entrance Stairs-Rails: None Entrance Stairs-Number of Steps: 1 Alternate Level Stairs-Number of Steps: flight Home Layout: Two level Home Equipment: Conservation officer, nature (2 wheels);Cane - single point      Prior Function Prior Level of Function : Needs assist             Mobility Comments: pt ambulates with use of SPC and  support of walls, sons provide transportation       Hand Dominance        Extremity/Trunk Assessment   Upper Extremity Assessment Upper Extremity Assessment: Overall WFL for tasks assessed    Lower Extremity Assessment Lower Extremity Assessment: Generalized  weakness    Cervical / Trunk Assessment Cervical / Trunk Assessment: Kyphotic  Communication   Communication: No difficulties  Cognition Arousal/Alertness: Awake/alert Behavior During Therapy: WFL for tasks assessed/performed Overall Cognitive Status: Within Functional Limits for tasks assessed                                          General Comments General comments (skin integrity, edema, etc.): VSS on RA    Exercises     Assessment/Plan    PT Assessment Patient needs continued PT services  PT Problem List Decreased strength;Decreased activity tolerance;Decreased balance;Decreased mobility;Decreased knowledge of use of DME;Cardiopulmonary status limiting activity       PT Treatment Interventions Gait training;Stair training;Balance training;Neuromuscular re-education;Patient/family education;Therapeutic exercise    PT Goals (Current goals can be found in the Care Plan section)  Acute Rehab PT Goals Patient Stated Goal: to return home PT Goal Formulation: With patient Time For Goal Achievement: 01/26/22 Potential to Achieve Goals: Good Additional Goals Additional Goal #1: Pt will report 1/4 DOE or less when mobilizing for at least 200' to demonstrate improved activity tolerance    Frequency Min 2X/week     Co-evaluation               AM-PAC PT "6 Clicks" Mobility  Outcome Measure Help needed turning from your back to your side while in a flat bed without using bedrails?: None Help needed moving from lying on your back to sitting on the side of a flat bed without using bedrails?: None Help needed moving to and from a bed to a chair (including a wheelchair)?: A Little Help needed standing up from a chair using your arms (e.g., wheelchair or bedside chair)?: A Little Help needed to walk in hospital room?: A Little Help needed climbing 3-5 steps with a railing? : A Little 6 Click Score: 20    End of Session   Activity Tolerance: Patient  tolerated treatment well Patient left: in chair;with call bell/phone within reach Nurse Communication: Mobility status PT Visit Diagnosis: Other abnormalities of gait and mobility (R26.89);Muscle weakness (generalized) (M62.81)    Time: 0076-2263 PT Time Calculation (min) (ACUTE ONLY): 27 min   Charges:   PT Evaluation $PT Eval Low Complexity: [place], PT, DPT Acute Rehabilitation Office [phone]   Zenaida Niece 01/12/2022, 10:37 AM

## 2022-01-12 NOTE — Progress Notes (Signed)
Mobility Specialist Progress Note    01/12/22 1458  Mobility  Activity Refused mobility   Pt stated she is having loose stools. Will f/u as appropriate.   Hildred Alamin Mobility Specialist

## 2022-01-12 NOTE — Progress Notes (Signed)
[facility]                                                                              Janice Small, is a 88 y.o. female, DOB - [birth year], YIF:[id number] Admit date - 01/09/2022    Outpatient Primary MD for the patient is Early, Coralee Pesa, NP  LOS - 3  days  Chief Complaint  Patient presents with   Leg Swelling       Brief summary   Patient is a 88 year old female with A-fib on Xarelto, HTN, hypothyroidism, hypercalcemia, osteoporosis, primary hyperparathyroidism presented with symptoms of fluid overload, bilateral lower leg swelling, increased weight gain and shortness of breath despite oral Lasix at home.  Denied any chest pain, nausea vomiting or diarrhea.  She also reported difficulty passing urine and chronic back pain. In ED, BP was somewhat elevated 156/98-114, heart rate 93.  Creatinine 1.08, BNP 376. Chest x-ray with small bilateral pleural effusions and mild cardiomegaly.  Received Lasix 60 mg IV x1.   Assessment & Plan    Principal Problem:   Acute CHF (congestive heart failure) ([place]), HFpEF -Patient was seen in cardiology office and was recommended admission for IV diuresis due to volume overload.  Chest x-ray showed small bilateral pleural effusions, BNP 376. -Continue IV Lasix 60 mg daily, creatinine trending up, 1.3 -negative balance of 3.2 L, peripheral edema improving -Strict I's and O's and daily weights. Weight 6/29 149lbs-> 138 today -2D echo today showed EF of 60 to 65%, no regional WMA, right ventricular systolic function normal, mild to moderate MR.  Active Problems:   Hypothyroidism -Continue levothyroxine, TSH 2.72 on 6/12    Essential hypertension -BP improving -Continue Lasix, Norvasc, Toprol-XL, lisinopril, clonidine   Hypokalemia -Stable, replace as needed    Primary hyperparathyroidism (HCC) -Stable,  Paroxysmal atrial fibrillation (HCC) -New onset, rate controlled, continue beta-blocker, Xarelto 20 mg daily    Acute  urinary retention Improved  Chronic back pain and anxiety -Air mattress, avoid NSAIDs  Hemorrhoids -Continue Anusol  Code Status: Full CODE STATUS DVT Prophylaxis:   Rivaroxaban (XARELTO) tablet 15 mg   Level of Care: Level of care: Telemetry Cardiac Family Communication: Updated patient  Disposition Plan:      Remains inpatient appropriate: Lasix decreased today, hopefully convert to p.o. tomorrow and DC home   Procedures:  2D echo  Consultants:   Cardiology  Medications  amLODipine  5 mg Oral Daily   cloNIDine  0.1 mg Oral BID   dapagliflozin propanediol  10 mg Oral Daily   diclofenac Sodium  2 g Topical TID   furosemide  60 mg Intravenous Daily   hydrocortisone  25 mg Rectal BID   levothyroxine  75 mcg Oral Q0600   lisinopril  40 mg Oral Daily   metoprolol succinate  50 mg Oral Daily   potassium chloride  40 mEq Oral BID   rivaroxaban  15 mg Oral Q supper      Subjective:   Janice Small was seen and examined today.  Overall improving, swelling in the legs has improved.  No significant chest pain or shortness of breath.  No fevers  Objective:  Vitals:   01/11/22 1133 01/11/22 1952 01/12/22 0536 01/12/22 0901  BP: 98/64 116/86 (!) 141/86 110/84  Pulse: 68 71 91   Resp: '18 16 14   '$ Temp: (!) 97.4 F (36.3 C) 98.5 F (36.9 C) 97.9 F (36.6 C) 98.6 F (37 C)  TempSrc: Oral Oral Oral Oral  SpO2: 97% 95% 93% 100%  Weight:   62.6 kg   Height:        Intake/Output Summary (Last 24 hours) at 01/12/2022 1231 Last data filed at 01/12/2022 0543 Gross per 24 hour  Intake 300 ml  Output 1000 ml  Net -700 ml     Wt Readings from Last 3 Encounters:  01/12/22 62.6 kg  01/09/22 68.2 kg  12/23/21 68 kg   Physical Exam General: Alert and oriented x 3, NAD Cardiovascular: Irregularly irregular, 2/6 systolic murmur Respiratory: CTAB Gastrointestinal: Soft, nontender, nondistended, NBS Ext: 1+ pedal edema bilaterally, improving Neuro: no new  deficits Skin: No rashes Psych: Normal affect and demeanor, alert and oriented x3     Data Reviewed:  I have personally reviewed following labs    CBC Lab Results  Component Value Date   WBC 4.7 01/10/2022   RBC 3.67 (L) 01/10/2022   HGB 11.8 (L) 01/10/2022   HCT 35.3 (L) 01/10/2022   MCV 96.2 01/10/2022   MCH 32.2 01/10/2022   PLT 146 (L) 01/10/2022   MCHC 33.4 01/10/2022   RDW 15.2 01/10/2022   LYMPHSABS 0.9 01/09/2022   MONOABS 0.4 01/09/2022   EOSABS 0.1 01/09/2022   BASOSABS 0.0 37/90/2409     Last metabolic panel Lab Results  Component Value Date   NA 140 01/12/2022   K 3.7 01/12/2022   CL 103 01/12/2022   CO2 28 01/12/2022   BUN 27 (H) 01/12/2022   CREATININE 1.35 (H) 01/12/2022   GLUCOSE 101 (H) 01/12/2022   GFRNONAA 37 (L) 01/12/2022   GFRAA 66 09/23/2018   CALCIUM 9.4 01/12/2022   PROT 6.8 01/09/2022   ALBUMIN 4.5 01/09/2022   LABGLOB 1.6 12/23/2021   AGRATIO 2.9 (H) 12/23/2021   BILITOT 1.4 (H) 01/09/2022   ALKPHOS 58 01/09/2022   AST 16 01/09/2022   ALT 43 01/09/2022   ANIONGAP 9 01/12/2022    CBG (last 3)  Recent Labs    01/10/22 0605 01/11/22 0607 01/12/22 0641  GLUCAP 107* 104* 94      Coagulation Profile: No results for input(s): "INR", "PROTIME" in the last 168 hours.   Radiology Studies: I have personally reviewed the imaging studies  ECHOCARDIOGRAM COMPLETE  Result Date: 01/10/2022 IMPRESSIONS  1. Left ventricular ejection fraction, by estimation, is 60 to 65%. The left ventricle has normal function. The left ventricle has no regional wall motion abnormalities. Left ventricular diastolic parameters are indeterminate.  2. Right ventricular systolic function is normal. The right ventricular size is normal. There is normal pulmonary artery systolic pressure.  3. Left atrial size was moderately dilated.  4. The mitral valve is abnormal. Mild to moderate mitral valve regurgitation. No evidence of mitral stenosis.  5. The aortic valve  is tricuspid. There is mild calcification of the aortic valve. Aortic valve regurgitation is not visualized. Aortic valve sclerosis is present, with no evidence of aortic valve stenosis.  6. The inferior vena cava is normal in size with greater than 50% respiratory variability, suggesting right atrial pressure of 3 mmHg. Jenkins Rouge MD Electronically signed by Jenkins Rouge MD Signature Date/Time: 01/10/2022/10:04:28 AM    Final    DG  Chest Port 1 View  Result Date: 01/09/2022 IMPRESSION: 1. Low lung volumes with basilar airspace disease and potential small effusions RIGHT greater than LEFT. 2. Mild cardiac enlargement. 3. Osteopenia. Electronically Signed   By: Zetta Bills M.D.   On: 01/09/2022 13:47       Maurina Fawaz M.D. [facility] 01/12/2022, 12:31 PM  Available via Epic secure chat 7am-7pm After 7 pm, please refer to night coverage provider listed on amion.

## 2022-01-13 DIAGNOSIS — I5031 Acute diastolic (congestive) heart failure: Secondary | ICD-10-CM | POA: Diagnosis not present

## 2022-01-13 DIAGNOSIS — R601 Generalized edema: Secondary | ICD-10-CM | POA: Diagnosis not present

## 2022-01-13 DIAGNOSIS — I4811 Longstanding persistent atrial fibrillation: Secondary | ICD-10-CM | POA: Diagnosis not present

## 2022-01-13 DIAGNOSIS — R338 Other retention of urine: Secondary | ICD-10-CM | POA: Diagnosis not present

## 2022-01-13 DIAGNOSIS — I480 Paroxysmal atrial fibrillation: Secondary | ICD-10-CM | POA: Diagnosis not present

## 2022-01-13 LAB — BASIC METABOLIC PANEL
Anion gap: 7 (ref 5–15)
BUN: 45 mg/dL — ABNORMAL HIGH (ref 8–23)
CO2: 27 mmol/L (ref 22–32)
Calcium: 9.9 mg/dL (ref 8.9–10.3)
Chloride: 105 mmol/L (ref 98–111)
Creatinine, Ser: 1.36 mg/dL — ABNORMAL HIGH (ref 0.44–1.00)
GFR, Estimated: 37 mL/min — ABNORMAL LOW (ref >60)
Glucose, Bld: 102 mg/dL — ABNORMAL HIGH (ref 70–99)
Potassium: 4.7 mmol/L (ref 3.5–5.1)
Sodium: 139 mmol/L (ref 135–145)

## 2022-01-13 LAB — GLUCOSE, CAPILLARY: Glucose-Capillary: 107 mg/dL — ABNORMAL HIGH (ref 70–99)

## 2022-01-13 MED ORDER — AMLODIPINE BESYLATE 2.5 MG PO TABS
2.5000 mg | ORAL_TABLET | Freq: Every day | ORAL | Status: DC
  Administered 2022-01-14 – 2022-01-15 (×2): 2.5 mg via ORAL
  Filled 2022-01-13 (×2): qty 1

## 2022-01-13 NOTE — Progress Notes (Signed)
[facility]                                                                              Janice Small, is a 88 y.o. female, DOB - January 05, 1928, NGE:[id number] Admit date - 01/09/2022    Outpatient Primary MD for the patient is Early, Coralee Pesa, NP  LOS - 4  days  Chief Complaint  Patient presents with   Leg Swelling       Brief summary   Patient is a 88 year old female with A-fib on Xarelto, HTN, hypothyroidism, hypercalcemia, osteoporosis, primary hyperparathyroidism presented with symptoms of fluid overload, bilateral lower leg swelling, increased weight gain and shortness of breath despite oral Lasix at home.  Denied any chest pain, nausea vomiting or diarrhea.  She also reported difficulty passing urine and chronic back pain. In ED, BP was somewhat elevated 156/98-114, heart rate 93.  Creatinine 1.08, BNP 376. Chest x-ray with small bilateral pleural effusions and mild cardiomegaly.  Received Lasix 60 mg IV x1.   Assessment & Plan    Principal Problem:   Acute CHF (congestive heart failure) ([place]), HFpEF -Patient was seen in cardiology office and was recommended admission for IV diuresis due to volume overload.  Chest x-ray showed small bilateral pleural effusions, BNP 376. -Creatinine 1.36, slightly trended up from 1.35 on 7/2.  Hold Lasix -Hold lisinopril, borderline BP, will decrease Norvasc back to 2.5 mg daily, will cut down lisinopril in a.m. pending renal function -Negative balance of 3.2 L, weight 6/29 149lbs-> 138 -2D echo today showed EF of 60 to 65%, no regional WMA, right ventricular systolic function normal, mild to moderate MR.  Active Problems:   Hypothyroidism -Continue levothyroxine, TSH 2.72 on 6/12    Essential hypertension -BP soft and borderline.  Hold antihypertensives today, has received morning a.m. doses -Hold Lasix, lisinopril due to renal insufficiency.   -cut down amlodipine back to 2.5 mg daily, continue  clonidine  Hypokalemia -Stable, replace as needed    Primary hyperparathyroidism (HCC) -Stable,  Paroxysmal atrial fibrillation ([place]) -New onset, rate controlled, continue beta-blocker, Xarelto 20 mg daily    Acute urinary retention Improved  Chronic back pain and anxiety -Air mattress, avoid NSAIDs  Hemorrhoids -Continue Anusol  Code Status: Full CODE STATUS DVT Prophylaxis:   Rivaroxaban (XARELTO) tablet 15 mg   Level of Care: Level of care: Telemetry Cardiac Family Communication: Updated patient  Disposition Plan:      Remains inpatient appropriate: Lasix, lisinopril on hold, will reassess creatinine function in a.m.  If improving, hopefully DC home in a.m.  Procedures:  2D echo  Consultants:   Cardiology  Medications  [START ON 01/14/2022] amLODipine  2.5 mg Oral Daily   cloNIDine  0.1 mg Oral BID   dapagliflozin propanediol  10 mg Oral Daily   diclofenac Sodium  2 g Topical TID   hydrocortisone  25 mg Rectal BID   levothyroxine  75 mcg Oral Q0600   metoprolol succinate  50 mg Oral Daily   potassium chloride  40 mEq Oral BID   rivaroxaban  15 mg Oral Q supper      Subjective:   Janice Small  was seen and examined today.  Overall improving, peripheral edema has improved.  No chest pain or shortness of breath.  No fevers.    Objective:   Vitals:   01/12/22 0536 01/12/22 0901 01/12/22 2030 01/13/22 1102  BP: (!) 141/86 1'10/84 97/63 92/62 '$  Pulse: 91  63 64  Resp: 14   18  Temp: 97.9 F (36.6 C) 98.6 F (37 C) 98.3 F (36.8 C) 97.6 F (36.4 C)  TempSrc: Oral Oral Oral Oral  SpO2: 93% 100% 97% 93%  Weight: 62.6 kg     Height:        Intake/Output Summary (Last 24 hours) at 01/13/2022 1348 Last data filed at 01/13/2022 0900 Gross per 24 hour  Intake 360 ml  Output 400 ml  Net -40 ml     Wt Readings from Last 3 Encounters:  01/12/22 62.6 kg  01/09/22 68.2 kg  12/23/21 68 kg    Physical Exam General: Alert and oriented x 3,  NAD Cardiovascular: Irregularly irregular, 2/6 systolic murmur Respiratory: CTAB, no wheezing, rales or rhonchi Gastrointestinal: Soft, nontender, nondistended, NBS Ext: 1+ pedal edema bilaterally Neuro: no new deficits   Data Reviewed:  I have personally reviewed following labs    CBC Lab Results  Component Value Date   WBC 4.7 01/10/2022   RBC 3.67 (L) 01/10/2022   HGB 11.8 (L) 01/10/2022   HCT 35.3 (L) 01/10/2022   MCV 96.2 01/10/2022   MCH 32.2 01/10/2022   PLT 146 (L) 01/10/2022   MCHC 33.4 01/10/2022   RDW 15.2 01/10/2022   LYMPHSABS 0.9 01/09/2022   MONOABS 0.4 01/09/2022   EOSABS 0.1 01/09/2022   BASOSABS 0.0 81/07/7508     Last metabolic panel Lab Results  Component Value Date   NA 139 01/13/2022   K 4.7 01/13/2022   CL 105 01/13/2022   CO2 27 01/13/2022   BUN 45 (H) 01/13/2022   CREATININE 1.36 (H) 01/13/2022   GLUCOSE 102 (H) 01/13/2022   GFRNONAA 37 (L) 01/13/2022   GFRAA 66 09/23/2018   CALCIUM 9.9 01/13/2022   PROT 6.8 01/09/2022   ALBUMIN 4.5 01/09/2022   LABGLOB 1.6 12/23/2021   AGRATIO 2.9 (H) 12/23/2021   BILITOT 1.4 (H) 01/09/2022   ALKPHOS 58 01/09/2022   AST 16 01/09/2022   ALT 43 01/09/2022   ANIONGAP 7 01/13/2022    CBG (last 3)  Recent Labs    01/11/22 0607 01/12/22 0641 01/13/22 0651  GLUCAP 104* 94 107*      Coagulation Profile: No results for input(s): "INR", "PROTIME" in the last 168 hours.   Radiology Studies: I have personally reviewed the imaging studies  ECHOCARDIOGRAM COMPLETE  Result Date: 01/10/2022 IMPRESSIONS  1. Left ventricular ejection fraction, by estimation, is 60 to 65%. The left ventricle has normal function. The left ventricle has no regional wall motion abnormalities. Left ventricular diastolic parameters are indeterminate.  2. Right ventricular systolic function is normal. The right ventricular size is normal. There is normal pulmonary artery systolic pressure.  3. Left atrial size was moderately  dilated.  4. The mitral valve is abnormal. Mild to moderate mitral valve regurgitation. No evidence of mitral stenosis.  5. The aortic valve is tricuspid. There is mild calcification of the aortic valve. Aortic valve regurgitation is not visualized. Aortic valve sclerosis is present, with no evidence of aortic valve stenosis.  6. The inferior vena cava is normal in size with greater than 50% respiratory variability, suggesting right atrial pressure of 3 mmHg. Jenkins Rouge  MD Electronically signed by Jenkins Rouge MD Signature Date/Time: 01/10/2022/10:04:28 AM    Final    DG Chest Port 1 View  Result Date: 01/09/2022 IMPRESSION: 1. Low lung volumes with basilar airspace disease and potential small effusions RIGHT greater than LEFT. 2. Mild cardiac enlargement. 3. Osteopenia. Electronically Signed   By: Zetta Bills M.D.   On: 01/09/2022 13:47       Keeley Sussman M.D. [facility] 01/13/2022, 1:48 PM  Available via Epic secure chat 7am-7pm After 7 pm, please refer to night coverage provider listed on amion.

## 2022-01-13 NOTE — Progress Notes (Signed)
Progress Note  Patient Name: Janice Small Date of Encounter: 01/13/2022  Primary Cardiologist: Donato Heinz, MD  Subjective   Breathing is OK  NO CP   Inpatient Medications    Scheduled Meds:  amLODipine  5 mg Oral Daily   cloNIDine  0.1 mg Oral BID   dapagliflozin propanediol  10 mg Oral Daily   diclofenac Sodium  2 g Topical TID   hydrocortisone  25 mg Rectal BID   levothyroxine  75 mcg Oral Q0600   lisinopril  40 mg Oral Daily   metoprolol succinate  50 mg Oral Daily   potassium chloride  40 mEq Oral BID   rivaroxaban  15 mg Oral Q supper    PRN Meds: acetaminophen **OR** acetaminophen, albuterol, labetalol, loperamide, LORazepam, ondansetron **OR** ondansetron (ZOFRAN) IV   Vital Signs    Vitals:   01/12/22 0536 01/12/22 0901 01/12/22 2030 01/13/22 1102  BP: (!) 141/86 1'10/84 97/63 92/62 '$  Pulse: 91  63 64  Resp: 14   18  Temp: 97.9 F (36.6 C) 98.6 F (37 C) 98.3 F (36.8 C) 97.6 F (36.4 C)  TempSrc: Oral Oral Oral Oral  SpO2: 93% 100% 97% 93%  Weight: 62.6 kg     Height:        Intake/Output Summary (Last 24 hours) at 01/13/2022 1126 Last data filed at 01/13/2022 0900 Gross per 24 hour  Intake 360 ml  Output 400 ml  Net -40 ml   Filed Weights   01/10/22 0437 01/11/22 0522 01/12/22 0536  Weight: 64 kg 63.3 kg 62.6 kg    Telemetry    Atrial fibrillation.Rates controled   Personally reviewed.  ECG    An ECG dated 01/09/2022 was personally reviewed today and demonstrated:  Atrial fibrillation with nonspecific T wave changes.  Physical Exam   GEN: No acute distress.   Neck: No JVD. Cardiac: Irregularly irregular, 2/6 systolic murmur.  Respiratory: Nonlabored. Clear to auscultation bilaterally. GI: Soft, nontender, bowel sounds present. MS: Tr LE edema  Neuro:  Nonfocal. Psych: Alert and oriented x 3. Normal affect.  Labs    Chemistry Recent Labs  Lab 01/09/22 1354 01/09/22 1831 01/10/22 0212 01/11/22 0407 01/12/22 0416  01/13/22 0748  NA 137 142   < > 140 140 139  K 3.6 3.1*   < > 3.5 3.7 4.7  CL 100 101   < > 103 103 105  CO2 27 28   < > '26 28 27  '$ GLUCOSE 114* 145*   < > 112* 101* 102*  BUN 33* 26*   < > 23 27* 45*  CREATININE 1.08* 1.06*   < > 1.08* 1.35* 1.36*  CALCIUM 10.6* 10.5*   < > 9.4 9.4 9.9  PROT 6.6 6.8  --   --   --   --   ALBUMIN 4.4 4.5  --   --   --   --   AST 13* 16  --   --   --   --   ALT 38 43  --   --   --   --   ALKPHOS 44 58  --   --   --   --   BILITOT 0.7 1.4*  --   --   --   --   GFRNONAA 48* 49*   < > 48* 37* 37*  ANIONGAP 10 13   < > '11 9 7   '$ < > = values in this interval not displayed.  Hematology Recent Labs  Lab 01/09/22 1354 01/10/22 0212  WBC 4.2 4.7  RBC 3.82* 3.67*  HGB 12.1 11.8*  HCT 37.2 35.3*  MCV 97.4 96.2  MCH 31.7 32.2  MCHC 32.5 33.4  RDW 15.3 15.2  PLT 150 146*    Cardiac Enzymes Recent Labs  Lab 01/09/22 1354 01/09/22 1500  TROPONINIHS 11 10    BNP Recent Labs  Lab 01/09/22 1354  BNP 376.0*     Radiology    DG Abd 2 Views  Result Date: 01/11/2022 CLINICAL DATA:  Nausea EXAM: ABDOMEN - 2 VIEW COMPARISON:  None Available. FINDINGS: Nonobstructive bowel gas pattern with air-filled loops of both large and small bowel on the central abdomen. There are a few air-fluid levels on upright view. There is no evidence of free air. No radio-opaque calculi or other significant radiographic abnormality is seen. Scoliotic thoracolumbar curvature with advanced multilevel spondylosis. IMPRESSION: Nonobstructive bowel gas pattern with air-filled loops of both large and small bowel on the central abdomen. There are a few air-fluid levels on upright view which may reflect an enteritis. Electronically Signed   By: Davina Poke D.O.   On: 01/11/2022 16:27    Assessment & Plan    1.  HFpEF with volume overload.  LVEF 60 to 65%.  Recorded urine output incomplete. Received 60 IV bid of lasix  Stopped yesterday AM   BUN/Cr 45 and 1.36 today, higher  than yesterday    Note Wilder Glade started yesterday  Recomm: I have reviewed with Dr Tana Coast  Given all of medicine changes in past few days and her age will keep and reassess in am   Hold diuretics for now.  WIll probably need something   Was on 20 lasix PTA   She says she didn't notice any difference in urine production with this      ? 40 mg  Again, on Farxiga now   2  HTN   Pt's BP is low today   She denies dizzienss    Note that amlodipine was increased to 5 mg     With bump in Cr I consider holding lisinopril until labs back tomorrow   Was on 40   ? Decreasing     Cut back on amlodopine to 2.5 again    Needs to perfuse     Keep on clonidine  \  3.  Persistent atrial fibrillation with CHA2DS2-VASc score of 5-6.  She is on Xarelto for stroke prophylaxis and has good heart rate control on Toprol-XL.   Signed, Dorris Carnes, MD  01/13/2022, 11:26 AM

## 2022-01-13 NOTE — Progress Notes (Signed)
Mobility Specialist Progress Note:   01/13/22 1612  Mobility  Activity Ambulated with assistance in room;Ambulated with assistance to bathroom  Level of Assistance Standby assist, set-up cues, supervision of patient - no hands on  Assistive Device Front wheel walker  Distance Ambulated (ft) 30 ft  Activity Response Tolerated well  $Mobility charge 1 Mobility   Pt received in chair asking to go to bathroom then back to bed. No complaints of pain. Left in bed with call bell in reach and all needs met.   [facility] [place] Mobility Specialist

## 2022-01-13 NOTE — Care Management Important Message (Signed)
Important Message  Patient Details  Name: Sherell Christoffel MRN: [id number] Date of Birth: [birth year]   Medicare Important Message Given:  Yes     Shelda Altes 01/13/2022, 9:32 AM

## 2022-01-13 NOTE — Progress Notes (Addendum)
Mobility Specialist Progress Note:   01/13/22 1028  Mobility  Activity Ambulated with assistance in hallway  Level of Assistance Standby assist, set-up cues, supervision of patient - no hands on  Assistive Device Front wheel walker  Distance Ambulated (ft) 80 ft  Activity Response Tolerated well  $Mobility charge 1 Mobility   Pt received in bed willing to participate in mobility. No complaints of pain. Left in chair with call bell in reach and all needs met.   [facility] Tima Curet Mobility Specialist

## 2022-01-14 DIAGNOSIS — E875 Hyperkalemia: Secondary | ICD-10-CM | POA: Diagnosis not present

## 2022-01-14 DIAGNOSIS — N179 Acute kidney failure, unspecified: Secondary | ICD-10-CM | POA: Diagnosis present

## 2022-01-14 DIAGNOSIS — I4819 Other persistent atrial fibrillation: Secondary | ICD-10-CM

## 2022-01-14 DIAGNOSIS — I509 Heart failure, unspecified: Secondary | ICD-10-CM | POA: Diagnosis not present

## 2022-01-14 DIAGNOSIS — R338 Other retention of urine: Secondary | ICD-10-CM | POA: Diagnosis not present

## 2022-01-14 DIAGNOSIS — R601 Generalized edema: Secondary | ICD-10-CM | POA: Diagnosis not present

## 2022-01-14 DIAGNOSIS — I4891 Unspecified atrial fibrillation: Secondary | ICD-10-CM

## 2022-01-14 DIAGNOSIS — I5031 Acute diastolic (congestive) heart failure: Secondary | ICD-10-CM | POA: Diagnosis not present

## 2022-01-14 LAB — BASIC METABOLIC PANEL
Anion gap: 8 (ref 5–15)
BUN: 50 mg/dL — ABNORMAL HIGH (ref 8–23)
CO2: 25 mmol/L (ref 22–32)
Calcium: 10.1 mg/dL (ref 8.9–10.3)
Chloride: 106 mmol/L (ref 98–111)
Creatinine, Ser: 1.45 mg/dL — ABNORMAL HIGH (ref 0.44–1.00)
GFR, Estimated: 34 mL/min — ABNORMAL LOW (ref >60)
Glucose, Bld: 109 mg/dL — ABNORMAL HIGH (ref 70–99)
Potassium: 5.4 mmol/L — ABNORMAL HIGH (ref 3.5–5.1)
Sodium: 139 mmol/L (ref 135–145)

## 2022-01-14 LAB — GLUCOSE, CAPILLARY
Glucose-Capillary: 113 mg/dL — ABNORMAL HIGH (ref 70–99)
Glucose-Capillary: 91 mg/dL (ref 70–99)

## 2022-01-14 MED ORDER — SODIUM ZIRCONIUM CYCLOSILICATE 10 G PO PACK
10.0000 g | PACK | Freq: Once | ORAL | Status: AC
  Administered 2022-01-14: 10 g via ORAL
  Filled 2022-01-14: qty 1

## 2022-01-14 MED ORDER — METOPROLOL SUCCINATE ER 25 MG PO TB24
25.0000 mg | ORAL_TABLET | Freq: Every day | ORAL | Status: DC
  Administered 2022-01-14 – 2022-01-15 (×2): 25 mg via ORAL
  Filled 2022-01-14 (×2): qty 1

## 2022-01-14 NOTE — Progress Notes (Signed)
Progress Note  Patient Name: Janice Small Date of Encounter: 01/14/2022  Primary Cardiologist: Donato Heinz, MD  Subjective   BP 144/77.  Net -200 cc yesterday, -3.7 L on admission.  Weight is recorded is up 4 pounds this morning, still down 7 pounds from admission.  Creatinine trending up (1.08 > 1.35 > 1.36 > 1.45), and also with mild hyperkalemia this morning (5.4)  Denies any chest pain or dyspnea.  Inpatient Medications    Scheduled Meds:  amLODipine  2.5 mg Oral Daily   cloNIDine  0.1 mg Oral BID   dapagliflozin propanediol  10 mg Oral Daily   diclofenac Sodium  2 g Topical TID   hydrocortisone  25 mg Rectal BID   levothyroxine  75 mcg Oral Q0600   metoprolol succinate  25 mg Oral Daily   rivaroxaban  15 mg Oral Q supper    PRN Meds: acetaminophen **OR** acetaminophen, albuterol, loperamide, LORazepam, ondansetron **OR** ondansetron (ZOFRAN) IV   Vital Signs    Vitals:   01/14/22 0300 01/14/22 0546 01/14/22 0910 01/14/22 1024  BP:  (!) 146/97 101/65 (!) 144/77  Pulse:  66  63  Resp:  20  20  Temp:  97.6 F (36.4 C)  97.8 F (36.6 C)  TempSrc:  Oral  Oral  SpO2:    99%  Weight: 64.3 kg     Height:        Intake/Output Summary (Last 24 hours) at 01/14/2022 1115 Last data filed at 01/14/2022 1025 Gross per 24 hour  Intake 600 ml  Output 875 ml  Net -275 ml   Filed Weights   01/11/22 0522 01/12/22 0536 01/14/22 0300  Weight: 63.3 kg 62.6 kg 64.3 kg    Telemetry    Atrial fibrillation.Rates controled   Personally reviewed.  ECG    An ECG dated 01/09/2022 was personally reviewed today and demonstrated:  Atrial fibrillation with nonspecific T wave changes.  Physical Exam   GEN: No acute distress.   Neck: Mild  JVD. Cardiac: Irregularly irregular, 2/6 systolic murmur.  Respiratory: Scattered crackles at bases GI: Soft, nontender, bowel sounds present. MS: Tr LE edema  Neuro:  Nonfocal. Psych: Alert and oriented x 3. Normal  affect.  Labs    Chemistry Recent Labs  Lab 01/09/22 1354 01/09/22 1831 01/10/22 0212 01/12/22 0416 01/13/22 0748 01/14/22 0155  NA 137 142   < > 140 139 139  K 3.6 3.1*   < > 3.7 4.7 5.4*  CL 100 101   < > 103 105 106  CO2 27 28   < > '28 27 25  '$ GLUCOSE 114* 145*   < > 101* 102* 109*  BUN 33* 26*   < > 27* 45* 50*  CREATININE 1.08* 1.06*   < > 1.35* 1.36* 1.45*  CALCIUM 10.6* 10.5*   < > 9.4 9.9 10.1  PROT 6.6 6.8  --   --   --   --   ALBUMIN 4.4 4.5  --   --   --   --   AST 13* 16  --   --   --   --   ALT 38 43  --   --   --   --   ALKPHOS 44 58  --   --   --   --   BILITOT 0.7 1.4*  --   --   --   --   GFRNONAA 48* 49*   < > 37* 37* 34*  ANIONGAP 10 13   < > '9 7 8   '$ < > = values in this interval not displayed.     Hematology Recent Labs  Lab 01/09/22 1354 01/10/22 0212  WBC 4.2 4.7  RBC 3.82* 3.67*  HGB 12.1 11.8*  HCT 37.2 35.3*  MCV 97.4 96.2  MCH 31.7 32.2  MCHC 32.5 33.4  RDW 15.3 15.2  PLT 150 146*    Cardiac Enzymes Recent Labs  Lab 01/09/22 1354 01/09/22 1500  TROPONINIHS 11 10    BNP Recent Labs  Lab 01/09/22 1354  BNP 376.0*     Radiology    No results found.  Assessment & Plan    1.  HFpEF with volume overload.  LVEF 60 to 65%.  Was given 60 IV bid of lasix, last dose on 7/2.  Started on Farxiga on 7/1.   -Volume status markedly improved from when I saw her in clinic last week, had weeping LE edema at that time, now trace edema. Creatinine trending up and mild hyperkalemia today.  Hold lisinopril.  Hold diuretics for today and will follow-up BMET tomorrow. D/w Dr Tana Coast, if stable or improving renal function tomorrow suspect will be able to discharge  2  HTN   on lisinopril 40 mg, clonidine 0.1 mg twice daily, Toprol-XL 25 mg daily and amlodipine 2.5 mg daily at home.  Toprol-XL increased to 50 mg daily this admission.  Holding lisinopril given her AKI/hyperkalemia today and BP has been soft.  HR as low as 40s, will decrease toprol XL  dose back to 25 mg daily.  3.  Persistent atrial fibrillation with CHA2DS2-VASc score of 5-6.  She is on Xarelto for stroke prophylaxis.  Decreased toprol XL dose back to 25 mg daily as above  4. Hyperkalemia: K 5.4 this morning.  Likely due to potassium supplementation, was getting 40 meq BID, discontinued this morning.  Given Lokelma this AM.   Signed, Donato Heinz, MD  01/14/2022, 11:15 AM

## 2022-01-14 NOTE — Progress Notes (Addendum)
.rdr          [facility]                                                                              Janice Small, is a 88 y.o. female, DOB - 13-Apr-1928, DDU:[id number] Admit date - 01/09/2022    Outpatient Primary MD for the patient is Early, Coralee Pesa, NP  LOS - 5  days  Chief Complaint  Patient presents with   Leg Swelling       Brief summary   Patient is a 88 year old female with A-fib on Xarelto, HTN, hypothyroidism, hypercalcemia, osteoporosis, primary hyperparathyroidism presented with symptoms of fluid overload, bilateral lower leg swelling, increased weight gain and shortness of breath despite oral Lasix at home.  Denied any chest pain, nausea vomiting or diarrhea.  She also reported difficulty passing urine and chronic back pain. In ED, BP was somewhat elevated 156/98-114, heart rate 93.  Creatinine 1.08, BNP 376. Chest x-ray with small bilateral pleural effusions and mild cardiomegaly.  Received Lasix 60 mg IV x1.   Assessment & Plan    Principal Problem:   Acute diastolic CHF (congestive heart failure) ([place]), HFpEF -Patient was seen in cardiology office and was recommended admission for IV diuresis due to volume overload.  Chest x-ray showed small bilateral pleural effusions, BNP 376. -Creatinine still trending up, 1.4 today, continue to hold Lasix, lisinopril  -Follow bmet in a.m. -Negative balance of 3.6 L, weight 6/29 149lbs-> 141 -2D echo showed EF of 60 to 65%, no regional WMA, right ventricular systolic function normal, mild to moderate MR. -Overall improving, lower extremity edema has improved  Active Problems:   Hypothyroidism -Continue levothyroxine, TSH 2.72 on 6/12    Essential hypertension -BP now stable, continue to hold Lasix, lisinopril -Continue amlodipine, clonidine, beta-blocker  Hyperkalemia -Likely due to potassium replacement, discontinued Given Lokelma 10 g x 1    Primary hyperparathyroidism (HCC) -Stable  Paroxysmal atrial  fibrillation (HCC) -Heart rate in low 50s, Toprol-XL decreased to 25 mg daily    Acute urinary retention Improved  Chronic back pain and anxiety -Air mattress, avoid NSAIDs  Hemorrhoids -Continue Anusol  Code Status: Full CODE STATUS DVT Prophylaxis:   Rivaroxaban (XARELTO) tablet 15 mg   Level of Care: Level of care: Telemetry Cardiac Family Communication: Updated patient's son on the phone today  Disposition Plan:      Remains inpatient appropriate: If renal function improving in a.m., will DC home.  Procedures:  2D echo  Consultants:   Cardiology  Medications  amLODipine  2.5 mg Oral Daily   cloNIDine  0.1 mg Oral BID   dapagliflozin propanediol  10 mg Oral Daily   diclofenac Sodium  2 g Topical TID   hydrocortisone  25 mg Rectal BID   levothyroxine  75 mcg Oral Q0600   metoprolol succinate  25 mg Oral Daily   rivaroxaban  15 mg Oral Q supper      Subjective:   Janice Small was seen and examined today.  Peripheral edema improving, no chest pain or shortness of breath.  Creatinine still trending up.  Objective:   Vitals:   01/14/22 0300 01/14/22 0546 01/14/22 0910 01/14/22 1024  BP:  (!) 146/97 101/65 (!) 144/77  Pulse:  66  63  Resp:  20  20  Temp:  97.6 F (36.4 C)  97.8 F (36.6 C)  TempSrc:  Oral  Oral  SpO2:    99%  Weight: 64.3 kg     Height:        Intake/Output Summary (Last 24 hours) at 01/14/2022 1433 Last data filed at 01/14/2022 1427 Gross per 24 hour  Intake 720 ml  Output 1000 ml  Net -280 ml     Wt Readings from Last 3 Encounters:  01/14/22 64.3 kg  01/09/22 68.2 kg  12/23/21 68 kg   Physical Exam General: Alert and oriented x 3, NAD Cardiovascular: Irregularly irregular, 2/6 systolic murmur Respiratory: Scattered crackles at the bases Gastrointestinal: Soft, nontender, nondistended, NBS Ext: trace pedal edema bilaterally Neuro: no new deficits Psych: Normal affect and demeanor, alert and oriented x3     Data  Reviewed:  I have personally reviewed following labs    CBC Lab Results  Component Value Date   WBC 4.7 01/10/2022   RBC 3.67 (L) 01/10/2022   HGB 11.8 (L) 01/10/2022   HCT 35.3 (L) 01/10/2022   MCV 96.2 01/10/2022   MCH 32.2 01/10/2022   PLT 146 (L) 01/10/2022   MCHC 33.4 01/10/2022   RDW 15.2 01/10/2022   LYMPHSABS 0.9 01/09/2022   MONOABS 0.4 01/09/2022   EOSABS 0.1 01/09/2022   BASOSABS 0.0 52/84/1324     Last metabolic panel Lab Results  Component Value Date   NA 139 01/14/2022   K 5.4 (H) 01/14/2022   CL 106 01/14/2022   CO2 25 01/14/2022   BUN 50 (H) 01/14/2022   CREATININE 1.45 (H) 01/14/2022   GLUCOSE 109 (H) 01/14/2022   GFRNONAA 34 (L) 01/14/2022   GFRAA 66 09/23/2018   CALCIUM 10.1 01/14/2022   PROT 6.8 01/09/2022   ALBUMIN 4.5 01/09/2022   LABGLOB 1.6 12/23/2021   AGRATIO 2.9 (H) 12/23/2021   BILITOT 1.4 (H) 01/09/2022   ALKPHOS 58 01/09/2022   AST 16 01/09/2022   ALT 43 01/09/2022   ANIONGAP 8 01/14/2022    CBG (last 3)  Recent Labs    01/13/22 0651 01/14/22 0124 01/14/22 0620  GLUCAP 107* 113* 91      Coagulation Profile: No results for input(s): "INR", "PROTIME" in the last 168 hours.   Radiology Studies: I have personally reviewed the imaging studies  ECHOCARDIOGRAM COMPLETE  Result Date: 01/10/2022 IMPRESSIONS  1. Left ventricular ejection fraction, by estimation, is 60 to 65%. The left ventricle has normal function. The left ventricle has no regional wall motion abnormalities. Left ventricular diastolic parameters are indeterminate.  2. Right ventricular systolic function is normal. The right ventricular size is normal. There is normal pulmonary artery systolic pressure.  3. Left atrial size was moderately dilated.  4. The mitral valve is abnormal. Mild to moderate mitral valve regurgitation. No evidence of mitral stenosis.  5. The aortic valve is tricuspid. There is mild calcification of the aortic valve. Aortic valve regurgitation is  not visualized. Aortic valve sclerosis is present, with no evidence of aortic valve stenosis.  6. The inferior vena cava is normal in size with greater than 50% respiratory variability, suggesting right atrial pressure of 3 mmHg. Jenkins Rouge MD Electronically signed by Jenkins Rouge MD Signature Date/Time: 01/10/2022/10:04:28 AM    Final    DG Chest Port 1 View  Result Date: 01/09/2022 IMPRESSION: 1. Low lung volumes with basilar airspace disease  and potential small effusions RIGHT greater than LEFT. 2. Mild cardiac enlargement. 3. Osteopenia. Electronically Signed   By: Zetta Bills M.D.   On: 01/09/2022 13:47       Zyler Hyson M.D. [facility] 01/14/2022, 2:33 PM  Available via Epic secure chat 7am-7pm After 7 pm, please refer to night coverage provider listed on amion.

## 2022-01-14 NOTE — Progress Notes (Signed)
Mobility Specialist Progress Note    01/14/22 1147  Mobility  Activity Ambulated with assistance in hallway  Level of Assistance Standby assist, set-up cues, supervision of patient - no hands on  Assistive Device Front wheel walker  Distance Ambulated (ft) 120 ft  Activity Response Tolerated well  $Mobility charge 1 Mobility   Post-Mobility: 67 HR  Pt received standing in room and agreeable. No complaints on walk. Returned to chair with call bell in reach and RN present.   Hildred Alamin Mobility Specialist

## 2022-01-15 ENCOUNTER — Other Ambulatory Visit (HOSPITAL_COMMUNITY): Payer: Self-pay

## 2022-01-15 ENCOUNTER — Other Ambulatory Visit: Payer: Self-pay | Admitting: Cardiology

## 2022-01-15 DIAGNOSIS — I4811 Longstanding persistent atrial fibrillation: Secondary | ICD-10-CM | POA: Diagnosis not present

## 2022-01-15 DIAGNOSIS — E210 Primary hyperparathyroidism: Secondary | ICD-10-CM

## 2022-01-15 DIAGNOSIS — E039 Hypothyroidism, unspecified: Secondary | ICD-10-CM

## 2022-01-15 DIAGNOSIS — I509 Heart failure, unspecified: Secondary | ICD-10-CM | POA: Diagnosis not present

## 2022-01-15 DIAGNOSIS — R338 Other retention of urine: Secondary | ICD-10-CM | POA: Diagnosis not present

## 2022-01-15 DIAGNOSIS — I5033 Acute on chronic diastolic (congestive) heart failure: Secondary | ICD-10-CM | POA: Diagnosis not present

## 2022-01-15 DIAGNOSIS — N179 Acute kidney failure, unspecified: Secondary | ICD-10-CM | POA: Diagnosis not present

## 2022-01-15 DIAGNOSIS — Z5189 Encounter for other specified aftercare: Secondary | ICD-10-CM

## 2022-01-15 LAB — BASIC METABOLIC PANEL
Anion gap: 7 (ref 5–15)
BUN: 42 mg/dL — ABNORMAL HIGH (ref 8–23)
CO2: 24 mmol/L (ref 22–32)
Calcium: 10.1 mg/dL (ref 8.9–10.3)
Chloride: 108 mmol/L (ref 98–111)
Creatinine, Ser: 1.08 mg/dL — ABNORMAL HIGH (ref 0.44–1.00)
GFR, Estimated: 48 mL/min — ABNORMAL LOW (ref >60)
Glucose, Bld: 96 mg/dL (ref 70–99)
Potassium: 4.5 mmol/L (ref 3.5–5.1)
Sodium: 139 mmol/L (ref 135–145)

## 2022-01-15 LAB — GLUCOSE, CAPILLARY: Glucose-Capillary: 87 mg/dL (ref 70–99)

## 2022-01-15 MED ORDER — FUROSEMIDE 40 MG PO TABS: 40.0000 mg | ORAL_TABLET | Freq: Every day | ORAL | Status: DC

## 2022-01-15 MED ORDER — FUROSEMIDE 40 MG PO TABS
40.0000 mg | ORAL_TABLET | Freq: Every day | ORAL | 0 refills | Status: DC
  Filled 2022-01-15: qty 30, 30d supply, fill #0

## 2022-01-15 MED ORDER — DAPAGLIFLOZIN PROPANEDIOL 10 MG PO TABS
10.0000 mg | ORAL_TABLET | Freq: Every day | ORAL | 0 refills | Status: DC
  Filled 2022-01-15: qty 30, 30d supply, fill #0

## 2022-01-15 MED ORDER — POTASSIUM CHLORIDE CRYS ER 10 MEQ PO TBCR: 10.0000 meq | EXTENDED_RELEASE_TABLET | Freq: Every day | ORAL | Status: DC

## 2022-01-15 MED ORDER — POTASSIUM CHLORIDE CRYS ER 10 MEQ PO TBCR
10.0000 meq | EXTENDED_RELEASE_TABLET | Freq: Every day | ORAL | 0 refills | Status: DC
  Filled 2022-01-15: qty 30, 30d supply, fill #0

## 2022-01-15 MED ORDER — AMLODIPINE BESYLATE 5 MG PO TABS: 5.0000 mg | ORAL_TABLET | Freq: Every day | ORAL | Status: DC

## 2022-01-15 MED ORDER — AMLODIPINE BESYLATE 5 MG PO TABS
5.0000 mg | ORAL_TABLET | Freq: Every day | ORAL | 0 refills | Status: DC
  Filled 2022-01-15: qty 30, 30d supply, fill #0

## 2022-01-15 NOTE — Assessment & Plan Note (Signed)
Follow up as outpatient.  

## 2022-01-15 NOTE — Assessment & Plan Note (Signed)
Echocardiogram with preserved LV systolic function with EF 60 to 30%, RV systolic function preserved, moderate dilatation of left atrium. Mild to moderate mitral valve regurgitation.   Patient was placed on IV furosemide for diuresis, negative fluid balance was achieved with significant improvement in her symptoms.   At the time of her discharge will continue diuresis with furosemide 40 mg daily and dapagliflozin. Continue with metoprolol. Holding on RAS inhibition because recovering AKI.  Continue blood pressure control with amlodipine and clonidine.

## 2022-01-15 NOTE — Progress Notes (Signed)
Physical Therapy Treatment Patient Details Name: Janice Small MRN: [id number] DOB: 1928/01/24 Today's Date: 01/15/2022   History of Present Illness 88 y.o. female presents to [facility] hospital on 01/09/2022 with BLE swelling, SOB. Pt admitted for acute CHF. PMH includes afib, HTN, osteoporosis.    PT Comments    Pt is progressing well with therapy. Pt ambulated 200 ft and completed stairs. Currently pt requires Supervision for bed mobility and gait, and Min guard for transfers. Pt is limited by LE weakness due to above HPI, however improving strength with skilled therapy. Pt will benefit from continued skilled therapy to increase strength, enhance circulation, and improve balance. Pt is safe to go home with assist from family. Will continue to follow in acute setting.    Recommendations for follow up therapy are one component of a multi-disciplinary discharge planning process, led by the attending physician.  Recommendations may be updated based on patient status, additional functional criteria and insurance authorization.  Follow Up Recommendations  No PT follow up     Assistance Recommended at Discharge Intermittent Supervision/Assistance  Patient can return home with the following A little help with walking and/or transfers;A little help with bathing/dressing/bathroom;Assistance with cooking/housework;Direct supervision/assist for financial management;Assist for transportation;Help with stairs or ramp for entrance   Equipment Recommendations  None recommended by PT    Recommendations for Other Services       Precautions / Restrictions Precautions Precautions: Fall Restrictions Weight Bearing Restrictions: No     Mobility  Bed Mobility Overal bed mobility: Needs Assistance Bed Mobility: Supine to Sit, Sit to Supine     Supine to sit: Supervision Sit to supine: Supervision   General bed mobility comments: Pt self sufficient, needed supervision for safety.     Transfers Overall transfer level: Needs assistance Equipment used: Rolling walker (2 wheels) Transfers: Sit to/from Stand Sit to Stand: Min guard           General transfer comment: Pt needed increased time upon initial sit to stand d/t sitting for period of time at bed. Pt demonstrated needing less time with toilet transfer. Able to navigate hand and feet placement. Needed VC's for initiation of standing.    Ambulation/Gait Ambulation/Gait assistance: Supervision Gait Distance (Feet): 200 Feet Assistive device: Rolling walker (2 wheels) Gait Pattern/deviations: Step-through pattern, Trunk flexed, Decreased stride length Gait velocity: decr     General Gait Details: Pt required VC's with gait speed, not too fast b/c pt reports Bil legs "give out". Needed VC's for moving around obstacles like room doorway, VC's for keeping head up straight.   Stairs Stairs: Yes Stairs assistance: Min guard Stair Management: One rail Right, Forwards,Side Number of Stairs: 3 General stair comments: Pt demonstrated step-to pattern for 3 steps forwards/sideways using Bil hands on Rt railing, pt able to turn safely to descend stairs with same railing at third step while maintaining balance.   Wheelchair Mobility    Modified Rankin (Stroke Patients Only)       Balance Overall balance assessment: Needs assistance Sitting-balance support: Single extremity supported Sitting balance-Leahy Scale: Good     Standing balance support: Bilateral upper extremity supported, During functional activity, Reliant on assistive device for balance Standing balance-Leahy Scale: Fair Standing balance comment: Pt needs Bil UE support either with RW or sink.                            Cognition Arousal/Alertness: Awake/alert Behavior During Therapy: WFL for tasks assessed/performed Overall  Cognitive Status: Within Functional Limits for tasks assessed                                  General Comments: Pt is sharp minded, knew room number, location, and situation.        Exercises      General Comments        Pertinent Vitals/Pain      Home Living                          Prior Function            PT Goals (current goals can now be found in the care plan section) Acute Rehab PT Goals Patient Stated Goal: to return home PT Goal Formulation: With patient Time For Goal Achievement: 01/26/22 Potential to Achieve Goals: Good Progress towards PT goals: Progressing toward goals    Frequency    Min 2X/week      PT Plan Current plan remains appropriate    Co-evaluation              AM-PAC PT "6 Clicks" Mobility   Outcome Measure  Help needed turning from your back to your side while in a flat bed without using bedrails?: None Help needed moving from lying on your back to sitting on the side of a flat bed without using bedrails?: None Help needed moving to and from a bed to a chair (including a wheelchair)?: A Little Help needed standing up from a chair using your arms (e.g., wheelchair or bedside chair)?: A Little Help needed to walk in hospital room?: A Little Help needed climbing 3-5 steps with a railing? : A Little 6 Click Score: 20    End of Session Equipment Utilized During Treatment: Gait belt Activity Tolerance: Patient tolerated treatment well Patient left: in bed;with call bell/phone within reach;with bed alarm set Nurse Communication: Mobility status PT Visit Diagnosis: Unsteadiness on feet (R26.81);Other abnormalities of gait and mobility (R26.89);Muscle weakness (generalized) (M62.81)     Time: 1107-1130 PT Time Calculation (min) (ACUTE ONLY): 23 min  Charges:  $Gait Training: 8-22 mins $Therapeutic Activity: 8-22 mins                     Margie Ege, SPT [place] 01/15/2022, 12:49 PM

## 2022-01-15 NOTE — Assessment & Plan Note (Signed)
Atrial fibrillation remained rate control with metoprolol Continue anticoagulation with rivaroxaban.

## 2022-01-15 NOTE — Hospital Course (Signed)
Janice Small was admitted to the hospital with the working diagnosis of heart failure decompensation.   88 yo female with the past medical history of atrial fibrillation, hypothyroidism, and primary hyperparathyroidism, who presented with dyspnea. Reported worsening dyspnea at home, associated with lower extremity edema and weight gain. On her initial physical examination her blood pressure was 156/98, HR 98, RR 33, 02 saturation 97%, lungs with no wheezing but positive rales, heart with S1 and S2 present with irregularly irregular, no gallops, abdomen not distended and positive +++ lower extremity edema.   Na 137, K 3,6 Cl 100, bicarbonate 27, glucose 114, bun 33 and cr at 1,0  BNP 376  High sensitive troponin 11 and 10   Wbc 4,2 hgn 12.1 plt 150   Chest radiograph with mild cardiomegaly, small right pleural effusion, with no other infiltrates.   EKG 91 bpm, left axis deviation, left anterior fascicular block, normal qtc, atrial fibrillation rhythm with no significant ST segment or T wave changes.   Patient was placed on furosemide for diuresis with improvement in her volume status.   Patient will follow up as outpatient.

## 2022-01-15 NOTE — Assessment & Plan Note (Signed)
Continue blood pressure control with clonidine, amlodipine and metoprolol Diuresis with furosemide and dapagliflozin.

## 2022-01-15 NOTE — Assessment & Plan Note (Signed)
Patient will continue with levothyroxine.

## 2022-01-15 NOTE — Assessment & Plan Note (Signed)
At the time of discharge has resolved.

## 2022-01-15 NOTE — Assessment & Plan Note (Signed)
Hyperkalemia.   Patient received furosemide for diuresis, with improvement in volume status. Peak serum cr reached 1.45.  At the time of her discharge her renal function has a serum cr of 1,0 with K at 4,5 and serum bicarbonate aet 24  Plan to continue diuresis at home with furosemide and SGLT2 inh Follow up renal function as outpatient.

## 2022-01-15 NOTE — Progress Notes (Addendum)
Progress Note  Patient Name: Janice Small Date of Encounter: 01/15/2022  Primary Cardiologist: Donato Heinz, MD  Subjective   Breathing is OK   No Cp    Inpatient Medications    Scheduled Meds:  amLODipine  2.5 mg Oral Daily   cloNIDine  0.1 mg Oral BID   dapagliflozin propanediol  10 mg Oral Daily   diclofenac Sodium  2 g Topical TID   hydrocortisone  25 mg Rectal BID   levothyroxine  75 mcg Oral Q0600   metoprolol succinate  25 mg Oral Daily   rivaroxaban  15 mg Oral Q supper    PRN Meds: acetaminophen **OR** acetaminophen, albuterol, loperamide, LORazepam, ondansetron **OR** ondansetron (ZOFRAN) IV   Vital Signs    Vitals:   01/14/22 2020 01/14/22 2150 01/15/22 0047 01/15/22 0300  BP: 132/82 (!) 139/96 127/82 (!) 147/101  Pulse: (!) 59  87 70  Resp: 18     Temp: (!) 97.5 F (36.4 C)   97.8 F (36.6 C)  TempSrc: Oral   Oral  SpO2: 93%  94% 96%  Weight:    63.9 kg  Height:        Intake/Output Summary (Last 24 hours) at 01/15/2022 0743 Last data filed at 01/15/2022 0732 Gross per 24 hour  Intake 600 ml  Output 850 ml  Net -250 ml   Filed Weights   01/12/22 0536 01/14/22 0300 01/15/22 0300  Weight: 62.6 kg 64.3 kg 63.9 kg    Telemetry    Atrial fibrillation.Rates controled   Personally reviewed.  ECG    An ECG dated 01/09/2022 was personally reviewed today and demonstrated:  Atrial fibrillation with nonspecific T wave changes.  Physical Exam   GEN: No acute distress.   Neck: Minimal increased JVP   Cardiac: Irregularly irregular, 2/6 systolic murmur.  Respiratory: Scattered crackles at bases GI: Soft, nontender, bowel sounds present. MS: Triv  LE edema  Neuro:  Nonfocal. Psych: Alert and oriented x 3. Normal affect.  Labs    Chemistry Recent Labs  Lab 01/09/22 1354 01/09/22 1831 01/10/22 0212 01/13/22 0748 01/14/22 0155 01/15/22 0335  NA 137 142   < > 139 139 139  K 3.6 3.1*   < > 4.7 5.4* 4.5  CL 100 101   < > 105 106  108  CO2 27 28   < > '27 25 24  '$ GLUCOSE 114* 145*   < > 102* 109* 96  BUN 33* 26*   < > 45* 50* 42*  CREATININE 1.08* 1.06*   < > 1.36* 1.45* 1.08*  CALCIUM 10.6* 10.5*   < > 9.9 10.1 10.1  PROT 6.6 6.8  --   --   --   --   ALBUMIN 4.4 4.5  --   --   --   --   AST 13* 16  --   --   --   --   ALT 38 43  --   --   --   --   ALKPHOS 44 58  --   --   --   --   BILITOT 0.7 1.4*  --   --   --   --   GFRNONAA 48* 49*   < > 37* 34* 48*  ANIONGAP 10 13   < > '7 8 7   '$ < > = values in this interval not displayed.     Hematology Recent Labs  Lab 01/09/22 1354 01/10/22 0212  WBC 4.2 4.7  RBC 3.82* 3.67*  HGB 12.1 11.8*  HCT 37.2 35.3*  MCV 97.4 96.2  MCH 31.7 32.2  MCHC 32.5 33.4  RDW 15.3 15.2  PLT 150 146*    Cardiac Enzymes Recent Labs  Lab 01/09/22 1354 01/09/22 1500  TROPONINIHS 11 10    BNP Recent Labs  Lab 01/09/22 1354  BNP 376.0*     Radiology    No results found.  Assessment & Plan    1.  HFpEF with volume overload.  LVEF 60 to 65%.  Was given 60 IV bid of lasix, last dose on 7/2.  Started on [place] on 7/1.   Pt has diuresed signifciantly since admit    Had to hold meds with bump in renal function    Will add back  lasix at 40 mg (was on 20 mg )    Need to reassess in clinic in near future to determine maintenance dose    Will arrange for follow up in cardiology       2  HTN   on lisinopril 40 mg, clonidine 0.1 mg twice daily, Toprol-XL 25 mg daily and amlodipine 2.5 mg daily at home.  Toprol-XL increased to 50 mg daily this admission.  Held lisinopril given her AKI/hyperkalemia today and BP has been soft.  HR as low as 40s, will decrease toprol XL dose back to 25 mg daily. Now labs near baseline    Will increase amlodipine to 5 mg   Watch for ankle edema   Also add back lasix to 40 daily   Start 10 KCL tomorrow    Will not resume lisinopril for now    Reasess labs and BP over next few wks.     3.  Persistent atrial fibrillation with CHA2DS2-VASc score of  5-6.  She is on Xarelto for stroke prophylaxis.  Decreased toprol XL dose back to 25 mg daily as above  4  REnal   BUN / Cr improved 42. / 1.08  Near baseline   4. Hyperkalemia: K 5.4 this morning.  Resolved with stopping K and Laokelma given  OK to d/c today   Will arrange for f/u appts   Signed, Dorris Carnes, MD  01/15/2022, 7:43 AM

## 2022-01-15 NOTE — TOC Transition Note (Signed)
Transition of Care [facility]) - CM/SW Discharge Note   Patient Details  Name: Janice Small MRN: [id number] Date of Birth: 1928-06-21  [facility] of [facility] CM/SW Contact:  Zenon Mayo, RN Phone Number: 01/15/2022, 11:32 AM   Clinical Narrative:    Patient is for dc today,  TOC to fill meds. She has no needs.          Patient Goals and CMS Choice        Discharge Placement                       Discharge Plan and Services                                     Social Determinants of Health (SDOH) Interventions     Readmission Risk Interventions     No data to display

## 2022-01-15 NOTE — Progress Notes (Signed)
Mobility Specialist Progress Note    01/15/22 0955  Mobility  Activity Ambulated with assistance in hallway  Level of Assistance Contact guard assist, steadying assist  Assistive Device Front wheel walker  Distance Ambulated (ft) 100 ft  Activity Response Tolerated well  $Mobility charge 1 Mobility   Pre-Mobility: 95 HR  Pt received in bed and agreeable. No complaints on walk. Returned to bed with call bell in reach.    Hildred Alamin Mobility Specialist

## 2022-01-15 NOTE — Discharge Summary (Signed)
Physician Discharge Summary   Patient: Janice Small MRN: [id number] DOB: Sep 21, 1927  Admit date:     01/09/2022  Discharge date: 01/15/22  Discharge Physician: Jimmy Picket Pria Klosinski   PCP: [name], NP   Recommendations at discharge:    Patient will continue diuresis with furosemide 40 mg daily  Added K supplementation  Follow up renal function as outpatient in 7 days.  Added SGLT 2 inh for heart failure, holding on RAS inhibition due to recovering acute kidney injury.  Increased amlodipine to 10 mg daily for better blood pressure control.   I spoke over the phone with the patient's som about patient's  condition, plan of care, prognosis and all questions were addressed.   Discharge Diagnoses: Principal Problem:   Acute on chronic diastolic CHF (congestive heart failure) (HCC) Active Problems:   Atrial fibrillation (HCC)   Essential hypertension   AKI (acute kidney injury) ([place])   Acute urinary retention   Hypothyroidism   Primary hyperparathyroidism ([place])  Resolved Problems:   * No resolved hospital problems. [facility] Course: Janice Small was admitted to the hospital with the working diagnosis of heart failure decompensation.   88 yo female with the past medical history of atrial fibrillation, hypothyroidism, and primary hyperparathyroidism, who presented with dyspnea. Reported worsening dyspnea at home, associated with lower extremity edema and weight gain. On her initial physical examination her blood pressure was 156/98, HR 98, RR 33, 02 saturation 97%, lungs with no wheezing but positive rales, heart with S1 and S2 present with irregularly irregular, no gallops, abdomen not distended and positive +++ lower extremity edema.   Na 137, K 3,6 Cl 100, bicarbonate 27, glucose 114, bun 33 and cr at 1,0  BNP 376  High sensitive troponin 11 and 10   Wbc 4,2 hgn 12.1 plt 150   Chest radiograph with mild cardiomegaly, small right pleural effusion, with no other  infiltrates.   EKG 91 bpm, left axis deviation, left anterior fascicular block, normal qtc, atrial fibrillation rhythm with no significant ST segment or T wave changes.   Patient was placed on furosemide for diuresis with improvement in her volume status.   Patient will follow up as outpatient.     Assessment and Plan: * Acute on chronic diastolic CHF (congestive heart failure) (HCC) Echocardiogram with preserved LV systolic function with EF 60 to 95%, RV systolic function preserved, moderate dilatation of left atrium. Mild to moderate mitral valve regurgitation.   Patient was placed on IV furosemide for diuresis, negative fluid balance was achieved with significant improvement in her symptoms.   At the time of her discharge will continue diuresis with furosemide 40 mg daily and dapagliflozin. Continue with metoprolol. Holding on RAS inhibition because recovering AKI.  Continue blood pressure control with amlodipine and clonidine.    Atrial fibrillation (HCC) Atrial fibrillation remained rate control with metoprolol Continue anticoagulation with rivaroxaban.   Essential hypertension Continue blood pressure control with clonidine, amlodipine and metoprolol Diuresis with furosemide and dapagliflozin.   AKI (acute kidney injury) ([place]) Hyperkalemia.   Patient received furosemide for diuresis, with improvement in volume status. Peak serum cr reached 1.45.  At the time of her discharge her renal function has a serum cr of 1,0 with K at 4,5 and serum bicarbonate aet 24  Plan to continue diuresis at home with furosemide and SGLT2 inh Follow up renal function as outpatient.   Acute urinary retention At the time of discharge has resolved.   Hypothyroidism Patient will continue  with levothyroxine.   Primary hyperparathyroidism ([place]) Follow up as outpatient.          Consultants: cardiology  Procedures performed: none   Disposition: Home Diet recommendation:  Cardiac  diet DISCHARGE MEDICATION: Allergies as of 01/15/2022       Reactions   Codeine Phosphate    REACTION: unspecified        Medication List     STOP taking these medications    lisinopril 40 MG tablet Commonly known as: ZESTRIL       TAKE these medications    amLODipine 5 MG tablet Commonly known as: NORVASC Take 1 tablet (5 mg total) by mouth daily. Start taking on: January 16, 2022 What changed:  medication strength how much to take   cloNIDine 0.1 MG tablet Commonly known as: CATAPRES TAKE 1 TABLET BY MOUTH TWICE  DAILY   dapagliflozin propanediol 10 MG Tabs tablet Commonly known as: FARXIGA Take 1 tablet (10 mg total) by mouth daily. Start taking on: January 16, 2022   furosemide 40 MG tablet Commonly known as: LASIX Take 1 tablet (40 mg total) by mouth daily. What changed:  medication strength how much to take   levothyroxine 75 MCG tablet Commonly known as: Synthroid Take 1 tablet (75 mcg total) by mouth daily.   MAGNESIUM PO Take 1 tablet by mouth daily.   metoprolol succinate 25 MG 24 hr tablet Commonly known as: TOPROL-XL Take 1 tablet (25 mg total) by mouth daily.   potassium chloride 10 MEQ tablet Commonly known as: KLOR-CON M Take 1 tablet (10 mEq total) by mouth daily. Start taking on: January 16, 2022   prednisoLONE acetate 1 % ophthalmic suspension Commonly known as: PRED FORTE Place 1 drop into the left eye 4 (four) times daily.   Prolensa 0.07 % Soln Generic drug: Bromfenac Sodium Place 1 drop into the left eye 4 (four) times daily.   rivaroxaban 20 MG Tabs tablet Commonly known as: XARELTO Take 1 tablet (20 mg total) by mouth daily with supper.   VITAMIN D PO Take 1 tablet by mouth daily.        Follow-up Information     Donato Heinz, MD Follow up on 01/23/2022.   Specialties: Cardiology, Radiology Why: at 1:40 pm Contact information: [place] [address] [facility] Follow up.   Specialty: Cardiology Why: you will need to have lab work done on Monday  ok to eat. Contact information: [place] [address] [phone]               Discharge Exam: Danley Danker Weights   01/12/22 0536 01/14/22 0300 01/15/22 0300  Weight: 62.6 kg 64.3 kg 63.9 kg   BP 116/83 (BP Location: Right Arm)   Pulse (!) 52   Temp 97.6 F (36.4 C) (Oral)   Resp 17   Ht 5' (1.524 m)   Wt 63.9 kg   SpO2 98%   BMI 27.52 kg/m   Patient is feeling better, no dyspnea or edema  Neurology awake and alert ENT with no pallor Cardiovascular with S1 and S2 present, irregularly irregular with no gallops, rubs or murmurs Respiratory with no wheezing Abdomen with no distention  No lower extremity edema   Condition at discharge: stable  The results of significant diagnostics from this hospitalization (including imaging, microbiology, ancillary and laboratory) are listed below for reference.   Imaging Studies: DG Abd  2 Views  Result Date: 01/11/2022 CLINICAL DATA:  Nausea EXAM: ABDOMEN - 2 VIEW COMPARISON:  None Available. FINDINGS: Nonobstructive bowel gas pattern with air-filled loops of both large and small bowel on the central abdomen. There are a few air-fluid levels on upright view. There is no evidence of free air. No radio-opaque calculi or other significant radiographic abnormality is seen. Scoliotic thoracolumbar curvature with advanced multilevel spondylosis. IMPRESSION: Nonobstructive bowel gas pattern with air-filled loops of both large and small bowel on the central abdomen. There are a few air-fluid levels on upright view which may reflect an enteritis. Electronically Signed   By: Davina Poke D.O.   On: 01/11/2022 16:27   ECHOCARDIOGRAM COMPLETE  Result Date: 01/10/2022    ECHOCARDIOGRAM REPORT   Patient Name:   Janice Small Date of Exam: 01/10/2022 Medical Rec #:  [id number]        Height:       60.0 in Accession #:    [id number]      Weight:       141.1 lb Date of Birth:  10-06-1927        BSA:          1.609 m Patient Age:    50 years        BP:           157/95 mmHg Patient Gender: F               HR:           80 bpm. Exam Location:  Inpatient Procedure: 2D Echo, Cardiac Doppler and Color Doppler Indications:    CHF-Acute Diastolic B76.28  History:        Patient has no prior history of Echocardiogram examinations.                 CHF; Risk Factors:Hypertension. Lower extremity edema.  Sonographer:    Darlina Sicilian RDCS Referring Phys: [id number] [place]  1. Left ventricular ejection fraction, by estimation, is 60 to 65%. The left ventricle has normal function. The left ventricle has no regional wall motion abnormalities. Left ventricular diastolic parameters are indeterminate.  2. Right ventricular systolic function is normal. The right ventricular size is normal. There is normal pulmonary artery systolic pressure.  3. Left atrial size was moderately dilated.  4. The mitral valve is abnormal. Mild to moderate mitral valve regurgitation. No evidence of mitral stenosis.  5. The aortic valve is tricuspid. There is mild calcification of the aortic valve. Aortic valve regurgitation is not visualized. Aortic valve sclerosis is present, with no evidence of aortic valve stenosis.  6. The inferior vena cava is normal in size with greater than 50% respiratory variability, suggesting right atrial pressure of 3 mmHg. FINDINGS  Left Ventricle: Left ventricular ejection fraction, by estimation, is 60 to 65%. The left ventricle has normal function. The left ventricle has no regional wall motion abnormalities. The left ventricular internal cavity size was normal in size. There is  no left ventricular hypertrophy. Left ventricular diastolic parameters are indeterminate. Right Ventricle: The right ventricular size is normal. No increase in right ventricular wall thickness. Right  ventricular systolic function is normal. There is normal pulmonary artery systolic pressure. The tricuspid regurgitant velocity is 2.25 m/s, and  with an assumed right atrial pressure of 3 mmHg, the estimated right ventricular systolic pressure is 60.7 mmHg. Left Atrium: Left atrial size was moderately dilated. Right Atrium: Right atrial size was normal in size. Pericardium: There is  no evidence of pericardial effusion. Mitral Valve: The mitral valve is abnormal. There is mild thickening of the mitral valve leaflet(s). Mild to moderate mitral valve regurgitation. No evidence of mitral valve stenosis. Tricuspid Valve: The tricuspid valve is normal in structure. Tricuspid valve regurgitation is mild . No evidence of tricuspid stenosis. Aortic Valve: The aortic valve is tricuspid. There is mild calcification of the aortic valve. Aortic valve regurgitation is not visualized. Aortic valve sclerosis is present, with no evidence of aortic valve stenosis. Pulmonic Valve: The pulmonic valve was normal in structure. Pulmonic valve regurgitation is mild. No evidence of pulmonic stenosis. Aorta: The aortic root is normal in size and structure. Venous: The inferior vena cava is normal in size with greater than 50% respiratory variability, suggesting right atrial pressure of 3 mmHg. IAS/Shunts: No atrial level shunt detected by color flow Doppler.  LEFT VENTRICLE PLAX 2D LVIDd:         4.50 cm     Diastology LVIDs:         3.30 cm     LV e' medial:    6.58 cm/s LV PW:         1.00 cm     LV E/e' medial:  10.7 LV IVS:        1.00 cm     LV e' lateral:   8.58 cm/s LVOT diam:     2.10 cm     LV E/e' lateral: 8.2 LV SV:         26 LV SV Index:   16 LVOT Area:     3.46 cm  LV Volumes (MOD) LV vol d, MOD A2C: 66.6 ml LV vol d, MOD A4C: 73.7 ml LV vol s, MOD A2C: 30.1 ml LV vol s, MOD A4C: 35.7 ml LV SV MOD A2C:     36.5 ml LV SV MOD A4C:     73.7 ml LV SV MOD BP:      37.0 ml RIGHT VENTRICLE RV S prime:     13.50 cm/s TAPSE (M-mode):  1.6 cm LEFT ATRIUM              Index        RIGHT ATRIUM           Index LA diam:        4.20 cm  2.61 cm/m   RA Area:     13.50 cm LA Vol (A2C):   110.0 ml 68.35 ml/m  RA Volume:   20.90 ml  12.99 ml/m LA Vol (A4C):   121.0 ml 75.18 ml/m LA Biplane Vol: 119.0 ml 73.94 ml/m  AORTIC VALVE             PULMONIC VALVE LVOT Vmax:   59.00 cm/s  PR End Diast Vel: 5.95 msec LVOT Vmean:  34.200 cm/s LVOT VTI:    0.076 m  AORTA Ao Root diam: 3.00 cm Ao Asc diam:  3.30 cm MITRAL VALVE               TRICUSPID VALVE MV Area (PHT): 6.15 cm    TR Peak grad:   20.2 mmHg MV Decel Time: 123 msec    TR Vmax:        225.00 cm/s MR Peak grad: 101.6 mmHg MR Mean grad: 51.0 mmHg    SHUNTS MR Vmax:      504.00 cm/s  Systemic VTI:  0.08 m MR Vmean:     298.0 cm/s   Systemic Diam: 2.10 cm MV  E velocity: 70.60 cm/s Jenkins Rouge MD Electronically signed by Jenkins Rouge MD Signature Date/Time: 01/10/2022/10:04:28 AM    Final    DG Chest Port 1 View  Result Date: 01/09/2022 CLINICAL DATA:  History of hypertension hypothyroidism, atrial fibrillation. EXAM: PORTABLE CHEST 1 VIEW COMPARISON:  August 12, 2013. FINDINGS: Cardiomediastinal contours and hilar structures are stable accounting for low lung volumes with signs of mild cardiac enlargement. Low lung volumes accentuate cardiac size as does portable AP projection. No lobar level consolidative changes.  No pneumothorax. Graded opacity in the RIGHT and LEFT chest RIGHT greater than LEFT. Osteopenia. Levoconvex curvature of the spine is similar to the prior study. No acute skeletal process on limited assessment. IMPRESSION: 1. Low lung volumes with basilar airspace disease and potential small effusions RIGHT greater than LEFT. 2. Mild cardiac enlargement. 3. Osteopenia. Electronically Signed   By: Zetta Bills M.D.   On: 01/09/2022 13:47   Intravitreal Injection, Pharmacologic Agent - OS - Left Eye  Result Date: 12/19/2021 Time Out 12/19/2021. 8:33 AM. Confirmed correct patient,  procedure, site, and patient consented. Anesthesia Topical anesthesia was used. Anesthetic medications included Lidocaine 2%, Proparacaine 0.5%. Procedure Preparation included 5% betadine to ocular surface, eyelid speculum. A (32g) needle was used. Injection: 2 mg aflibercept 2 MG/0.05ML   Route: Intravitreal, Site: Left Eye   NDC: [id number], Lot: [id number], Expiration date: 10/11/2022, Waste: 0 mL Post-op Post injection exam found visual acuity of at least counting fingers. The patient tolerated the procedure well. There were no complications. The patient received written and verbal post procedure care education. Post injection medications were not given.   OCT, Retina - OU - Both Eyes  Result Date: 12/19/2021 Right Eye Quality was good. Central Foveal Thickness: 268. Progression has been stable. Findings include normal foveal contour, no IRF, no SRF, retinal drusen . Left Eye Quality was good. Central Foveal Thickness: 262. Progression has improved. Findings include normal foveal contour, no SRF, retinal drusen , intraretinal hyper-reflective material, intraretinal fluid (Persistent IRF/IRHM temporal macula and fovea -- slightly improved). 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 CME - cystoid macular edema. PED - pigment epithelial detachment. IRF - intraretinal fluid. SRF - subretinal fluid. EZ - ellipsoid zone. ERM - epiretinal membrane. ORA - outer retinal atrophy. ORT - outer retinal tubulation. SRHM - subretinal hyper-reflective material. IRHM - intraretinal hyper-reflective material    Microbiology: Results for orders placed or performed during the hospital encounter of 09/01/19  Novel Coronavirus, NAA (Hosp order, Send-out to Ref Lab; TAT 18-24 hrs     Status: None   Collection  Time: 09/01/19  5:38 PM   Specimen: Nasopharyngeal Swab; Respiratory  Result Value Ref Range Status   SARS-CoV-2, NAA NOT DETECTED NOT DETECTED Final    Comment: (NOTE) This nucleic acid amplification test was developed and its performance characteristics determined by [employer]. Nucleic acid amplification tests include RT-PCR and TMA. This test has not been FDA cleared or approved. This test has been authorized by FDA under an Emergency Use Authorization (EUA). This test is only authorized for the duration of time the declaration that circumstances exist justifying the authorization of the emergency use of in vitro diagnostic tests for detection of SARS-CoV-2 virus and/or diagnosis of COVID-19 infection under section 564(b)(1) of the Act, 21 U.S.C. 856DJS-9(F) (1), unless  the authorization is terminated or revoked sooner. When diagnostic testing is negative, the possibility of a false negative result should be considered in the context of a patient's recent exposures and the presence of clinical signs and symptoms consistent with COVID-19. An individual without symptoms of COVID- 19 and who is not shedding SARS-CoV-2  virus would expect to have a negative (not detected) result in this assay. Performed At: [facility] [address] [place], [place] [id number] Rush Farmer MD YQ:[id number]    [place]  Final    Comment: Performed at [facility], [place] [address] [place], [place] [address]    Labs: CBC: Recent Labs  Lab 01/09/22 1354 01/10/22 0212  WBC 4.2 4.7  NEUTROABS 2.8  --   HGB 12.1 11.8*  HCT 37.2 35.3*  MCV 97.4 96.2  PLT 150 945*   Basic Metabolic Panel: Recent Labs  Lab 01/10/22 0212 01/10/22 1259 01/11/22 0407 01/12/22 0416 01/13/22 0748 01/14/22 0155 01/15/22 0335  NA 143  --  140 140 139 139 139  K 2.8*   < > 3.5 3.7 4.7 5.4* 4.5  CL 103  --  103 103 105 106 108  CO2 29  --  '26 28 27 25 24   '$ GLUCOSE 121*  --  112* 101* 102* 109* 96  BUN 24*  --  23 27* 45* 50* 42*  CREATININE 1.07*  --  1.08* 1.35* 1.36* 1.45* 1.08*  CALCIUM 9.6  --  9.4 9.4 9.9 10.1 10.1  MG 1.8  --   --   --   --   --   --    < > = values in this interval not displayed.   Liver Function Tests: Recent Labs  Lab 01/09/22 1354 01/09/22 1831  AST 13* 16  ALT 38 43  ALKPHOS 44 58  BILITOT 0.7 1.4*  PROT 6.6 6.8  ALBUMIN 4.4 4.5   CBG: Recent Labs  Lab 01/12/22 0641 01/13/22 0651 01/14/22 0124 01/14/22 0620 01/15/22 0614  GLUCAP 94 107* 113* 91 87    Discharge time spent: greater than 30 minutes.  Signed: Tawni Millers, MD [facility] 01/15/2022

## 2022-01-16 ENCOUNTER — Encounter (INDEPENDENT_AMBULATORY_CARE_PROVIDER_SITE_OTHER): Payer: Medicare Other | Admitting: Ophthalmology

## 2022-01-16 DIAGNOSIS — H35033 Hypertensive retinopathy, bilateral: Secondary | ICD-10-CM

## 2022-01-16 DIAGNOSIS — H40003 Preglaucoma, unspecified, bilateral: Secondary | ICD-10-CM

## 2022-01-16 DIAGNOSIS — H34832 Tributary (branch) retinal vein occlusion, left eye, with macular edema: Secondary | ICD-10-CM

## 2022-01-16 DIAGNOSIS — H353131 Nonexudative age-related macular degeneration, bilateral, early dry stage: Secondary | ICD-10-CM

## 2022-01-16 DIAGNOSIS — Z961 Presence of intraocular lens: Secondary | ICD-10-CM

## 2022-01-16 DIAGNOSIS — I10 Essential (primary) hypertension: Secondary | ICD-10-CM

## 2022-01-16 NOTE — Progress Notes (Signed)
[facility] Note  01/20/2022    CHIEF COMPLAINT Patient presents for Retina Follow Up  HISTORY OF PRESENT ILLNESS: Janice Small is a 88 y.o. female who presents to the clinic today for:  HPI     Retina Follow Up   Patient presents with  CRVO/BRVO.  In left eye.  I, the attending physician,  performed the HPI with the patient and updated documentation appropriately.        Comments   Patient was in the hospital for fluid around her heart.  She did not use her eye drops correctly while there (Prednisolone and Prolensa).  Hasn't noticed any changes in vision.        Last edited by Bernarda Caffey, MD on 01/20/2022  1:21 PM.    Pt states she has been in the hospital for her heart and water retention, she states it feels like there is something in the corner of her left eye, she states it hurt all day  Referring physician: [name], NP [address] [place],  [place] [address]  HISTORICAL INFORMATION:   Selected notes from the [place] Referred by Dr. Jerline Pain for eval of BRVO OS LEE: 10.12.2021 Ocular Hx- Glc suspect   CURRENT MEDICATIONS: Current Outpatient Medications (Ophthalmic Drugs)  Medication Sig   Bromfenac Sodium (PROLENSA) 0.07 % SOLN Place 1 drop into the left eye 4 (four) times daily.   prednisoLONE acetate (PRED FORTE) 1 % ophthalmic suspension Place 1 drop into the left eye 4 (four) times daily.   No current facility-administered medications for this visit. (Ophthalmic Drugs)   Current Outpatient Medications (Other)  Medication Sig   amLODipine (NORVASC) 5 MG tablet Take 1 tablet (5 mg total) by mouth daily.   cloNIDine (CATAPRES) 0.1 MG tablet TAKE 1 TABLET BY MOUTH TWICE  DAILY   dapagliflozin propanediol (FARXIGA) 10 MG TABS tablet Take 1 tablet (10 mg total) by mouth daily.   furosemide (LASIX) 40 MG tablet Take 1 tablet (40 mg total) by mouth daily.   levothyroxine (SYNTHROID) 75 MCG tablet  Take 1 tablet (75 mcg total) by mouth daily.   MAGNESIUM PO Take 1 tablet by mouth daily.   metoprolol succinate (TOPROL-XL) 25 MG 24 hr tablet Take 1 tablet (25 mg total) by mouth daily.   potassium chloride (KLOR-CON M) 10 MEQ tablet Take 1 tablet (10 mEq total) by mouth daily.   rivaroxaban (XARELTO) 20 MG TABS tablet Take 1 tablet (20 mg total) by mouth daily with supper.   VITAMIN D PO Take 1 tablet by mouth daily.   No current facility-administered medications for this visit. (Other)   REVIEW OF SYSTEMS: ROS   Positive for: Gastrointestinal, Musculoskeletal, Eyes Negative for: Constitutional, Neurological, Skin, Genitourinary, HENT, Endocrine, Cardiovascular, Respiratory, Psychiatric, Allergic/Imm, Heme/Lymph Last edited by Leonie Douglas, COA on 01/20/2022  9:45 AM.     ALLERGIES Allergies  Allergen Reactions   Codeine Phosphate     REACTION: unspecified   PAST MEDICAL HISTORY Past Medical History:  Diagnosis Date   BREAST BIOPSY, HX OF 11/27/2006   CHF (congestive heart failure) ([place])    COLONIC POLYPS, HX OF 11/27/2006   HEMORRHOIDS 01/17/2010   HYPERTENSION 11/27/2006   Hypertensive retinopathy    OU   HYPOTHYROIDISM 11/27/2006   Macular degeneration    Dry OU   MENOPAUSAL SYNDROME 11/27/2006   Vascular disease    Past Surgical History:  Procedure Laterality Date   ABDOMINAL HYSTERECTOMY  APPENDECTOMY     BREAST EXCISIONAL BIOPSY Right 1987   BREAST SURGERY     bx   CATARACT EXTRACTION Bilateral    EYE SURGERY Bilateral    Cat Sx OU   HEMORRHOID BANDING     TONSILLECTOMY     FAMILY HISTORY Family History  Problem Relation Age of Onset   Heart disease Father    Colon cancer Neg Hx    Esophageal cancer Neg Hx    Pancreatic cancer Neg Hx    Liver cancer Neg Hx    Stomach cancer Neg Hx    SOCIAL HISTORY Social History   Tobacco Use   Smoking status: Never   Smokeless tobacco: Never  Vaping Use   Vaping Use: Never used  Substance Use Topics    Alcohol use: Never    Comment: rarely   Drug use: No       OPHTHALMIC EXAM:  Base Eye Exam     Visual Acuity (Snellen - Linear)       Right Left   Dist cc 20/20 -2 20/60 +2   Dist ph cc  20/50    Correction: Glasses         Tonometry (Tonopen, 9:53 AM)       Right Left   Pressure 18 20         Pupils       Dark Light Shape React APD   Right 3 2 Round Minimal None   Left 3 2 Round Minimal None         Visual Fields (Counting fingers)       Left Right    Full Full         Extraocular Movement       Right Left    Full Full         Neuro/Psych     Oriented x3: Yes   Mood/Affect: Normal         Dilation     Both eyes: 1.0% Mydriacyl, 2.5% Phenylephrine @ 9:53 AM           Slit Lamp and Fundus Exam     Slit Lamp Exam       Right Left   Lids/Lashes Dermatochalasis - upper lid Dermatochalasis - upper lid, mild Meibomian gland dysfunction   Conjunctiva/Sclera White and quiet White and quiet   Cornea 2+ inferior Punctate epithelial erosions, well healed temporal cataract wounds, arcus, early band K nasal and temporal, tear film debris 3+ inferior Punctate epithelial erosions, irregular epi, well healed temporal cataract wounds, arcus, tear film debris   Anterior Chamber Deep and quiet Deep and quiet   Iris Round and dilated Round and dilated   Lens 3 piece PC IOL in good position with open PC 3 piece PC IOL in good position, 1+PCO (non-central)   Anterior Vitreous Vitreous syneresis Vitreous syneresis, Posterior vitreous detachment         Fundus Exam       Right Left   Disc Pink and Sharp, Compact mild Pallor, Sharp rim, temporal PPA, Compact   C/D Ratio 0.5 0.4   Macula Flat, Blunted foveal reflex, mild Drusen, RPE mottling and clumping, no heme or edema Blunted foveal reflex, focal IRH and exudates temporal macula -- persistent, cystic changes and edema -- persistent, +laser changes, Drusen   Vessels attenuated, mild tortuosity  Tortuous, severe attenuation of vessels superior macula   Periphery Attached, No heme  Attached, mild reticular degeneration  IMAGING AND PROCEDURES  Imaging and Procedures for 01/20/2022  OCT, Retina - OU - Both Eyes       Right Eye Quality was good. Central Foveal Thickness: 265. Progression has been stable. Findings include normal foveal contour, no IRF, no SRF, retinal drusen .   Left Eye Quality was good. Central Foveal Thickness: 269. Progression has been stable. Findings include normal foveal contour, no SRF, retinal drusen , intraretinal hyper-reflective material, intraretinal fluid (Persistent IRF/IRHM temporal macula and fovea).   Notes *Images captured and stored on drive  Diagnosis / Impression:  OD: non-exu ARMD w/ fine drusen OS: BRVO w/ CME -- persistent IRF/IRHM temporal macula and fovea   Clinical management:  See below  Abbreviations: NFP - Normal foveal profile. CME - cystoid macular edema. PED - pigment epithelial detachment. IRF - intraretinal fluid. SRF - subretinal fluid. EZ - ellipsoid zone. ERM - epiretinal membrane. ORA - outer retinal atrophy. ORT - outer retinal tubulation. SRHM - subretinal hyper-reflective material. IRHM - intraretinal hyper-reflective material      Intravitreal Injection, Pharmacologic Agent - OS - Left Eye       Time Out 01/20/2022. 11:19 AM. Confirmed correct patient, procedure, site, and patient consented.   Anesthesia Topical anesthesia was used. Anesthetic medications included Lidocaine 2%, Proparacaine 0.5%.   Procedure Preparation included 5% betadine to ocular surface, eyelid speculum. A (32g) needle was used.   Injection: 2 mg aflibercept 2 MG/0.05ML   Route: Intravitreal, Site: Left Eye   NDC: [id number], Lot: [id number], Expiration date: 11/11/2022, Waste: 0 mL   Post-op Post injection exam found visual acuity of at least counting fingers. The patient tolerated the procedure well. There were no  complications. The patient received written and verbal post procedure care education. Post injection medications were not given.            ASSESSMENT/PLAN:    ICD-10-CM   1. Branch retinal vein occlusion of left eye with macular edema  H34.8320 OCT, Retina - OU - Both Eyes    Intravitreal Injection, Pharmacologic Agent - OS - Left Eye    2. Essential hypertension  I10     3. Hypertensive retinopathy of both eyes  H35.033     4. Early dry stage nonexudative age-related macular degeneration of both eyes  H35.3131     5. Pseudophakia, both eyes  Z96.1     6. Glaucoma suspect of both eyes  H40.003     7. Dry eyes  H04.123       1. BRVO w/ CME OS             - S/P IVA OS #1 (10.18.21), #2 (11.15.21), #3 (12.13.21), #4 (01.10.22), #5 (02.07.22), #6 (03.07.22), #7 (04.04.22), #8 (5.2.22) -- IVA resistance  - S/P IVE OS #1 (05.31.22) -- sample, #2 (06.28.22), #3 (07.26.22), #4 (08.23.22), #5 (09.20.22), #6 (10.20.22), #7 (11.17.22), #8 (12.15.22), #9 (01.12.23), #10 (02.09.23), #11 (03.16.23), #12 (04.13.23), #13 (05.11.23), #14 (06.08.23)             - S/P focal laser OS (02.16.22) - BCVA stable at 20/30 - OCT shows persistent IRF/IRHM temporal macula and fovea -- slightly improved - reduced PF/Prolensa to BID OS -- ?steroid response on IOP (PF/Prolensa) started 2.9.23 for possible CME component) - recommend IVE OS #15 today, 07.10.23 - pt wishes to proceed - RBA of procedure discussed, questions answered - informed consent obtained and signed - see procedure note - Avastin informed consent obtained and signed, 10.18.21 (OS) - Eylea informed  consent obtained and signed, 05.31.22 - [facility] # [id number], 07/14/2021 - 07/13/2022  - cont PF/Prolensa to BID OS - F/U 4 weeks, DFE, OCT, possible injection   2,3. Hypertensive retinopathy OU - discussed importance of tight BP control - monitor  4. Age related macular degeneration, non-exudative, both eyes  - The incidence,  anatomy, and pathology of dry AMD, risk of progression, and the AREDS and AREDS 2 study including smoking risks discussed with patient.   - recommend Amsler grid monitoring  5. Pseudophakia OU  - s/p CE/IOL  - IOL in good position, doing well  - monitor  6. Glaucoma Suspect  - IOP 18,20  - discussed the possibility of starting a glaucoma drop in the future    - under the expert care of Dr. Jerline Pain  7. Dry eyes OU (OS>OD) - recommend artificial tears and lubricating ointment as needed   Ophthalmic Meds Ordered this visit:  No orders of the defined types were placed in this encounter.    Return in about 4 weeks (around 02/17/2022) for f/u BRVO OS, DFE, OCT.  There are no Patient Instructions on file for this visit.  This document serves as a record of services personally performed by Gardiner Sleeper, MD, PhD. It was created on their behalf by Roselee Nova, COMT. The creation of this record is the provider's dictation and/or activities during the visit.  Electronically signed by: Roselee Nova, COMT 01/20/22 1:22 PM  This document serves as a record of services personally performed by Gardiner Sleeper, MD, PhD. It was created on their behalf by San Jetty. Owens Shark, OA an ophthalmic technician. The creation of this record is the provider's dictation and/or activities during the visit.    Electronically signed by: San Jetty. [place], [place] 07.10.2023 1:22 PM  Gardiner Sleeper, M.D., Ph.D. Diseases & Surgery of the Retina and Vitreous [facility]  I have reviewed the above documentation for accuracy and completeness, and I agree with the above. Gardiner Sleeper, M.D., Ph.D. 01/20/22 1:24 PM  Abbreviations: M myopia (nearsighted); A astigmatism; H hyperopia (farsighted); P presbyopia; Mrx spectacle prescription;  CTL contact lenses; OD right eye; OS left eye; OU both eyes  XT exotropia; ET esotropia; PEK punctate epithelial keratitis; PEE punctate epithelial erosions; DES dry  eye syndrome; MGD meibomian gland dysfunction; ATs artificial tears; PFAT's preservative free artificial tears; [place] nuclear sclerotic cataract; PSC posterior subcapsular cataract; ERM epi-retinal membrane; PVD posterior vitreous detachment; RD retinal detachment; DM diabetes mellitus; DR diabetic retinopathy; NPDR non-proliferative diabetic retinopathy; PDR proliferative diabetic retinopathy; CSME clinically significant macular edema; DME diabetic macular edema; dbh dot blot hemorrhages; CWS cotton wool spot; POAG primary open angle glaucoma; C/D cup-to-disc ratio; HVF humphrey visual field; GVF goldmann visual field; OCT optical coherence tomography; IOP intraocular pressure; BRVO Branch retinal vein occlusion; CRVO central retinal vein occlusion; CRAO central retinal artery occlusion; BRAO branch retinal artery occlusion; RT retinal tear; SB scleral buckle; PPV pars plana vitrectomy; VH Vitreous hemorrhage; PRP panretinal laser photocoagulation; IVK intravitreal kenalog; VMT vitreomacular traction; MH Macular hole;  NVD neovascularization of the disc; NVE neovascularization elsewhere; AREDS age related eye disease study; ARMD age related macular degeneration; POAG primary open angle glaucoma; EBMD epithelial/anterior basement membrane dystrophy; ACIOL anterior chamber intraocular lens; IOL intraocular lens; PCIOL posterior chamber intraocular lens; Phaco/IOL phacoemulsification with intraocular lens placement; [place] photorefractive keratectomy; LASIK laser assisted in situ keratomileusis; HTN hypertension; DM diabetes mellitus; COPD chronic obstructive pulmonary disease

## 2022-01-17 ENCOUNTER — Telehealth (HOSPITAL_BASED_OUTPATIENT_CLINIC_OR_DEPARTMENT_OTHER): Payer: Self-pay

## 2022-01-17 ENCOUNTER — Telehealth (HOSPITAL_BASED_OUTPATIENT_CLINIC_OR_DEPARTMENT_OTHER): Payer: Self-pay | Admitting: Cardiology

## 2022-01-17 NOTE — Telephone Encounter (Signed)
Patient reports weeping in her LLE near her ankle. She is taking lasix '20mg'$  daily and keeping the site clean and dry. Recommended that she elevate her legs when sitting.

## 2022-01-17 NOTE — Telephone Encounter (Signed)
Transition Care Management Follow-up Telephone Call Date of discharge and from where: [facility] 01/09/22-01/15/22 How have you been since you were released from the hospital? "I feel pretty good except for my left leg is weeping near the ankle". Any questions or concerns? Yes- Patient noted she called her PCP's office this morning to discuss her leg weeping. The RN at the practice had advised her to elevate her legs since she was already on a diuretic.  Patient said she had not elevated her legs yet. Inquired if she has a recliner and advised to sit in recline with her legs up and that should help.  Patient to discuss with PCP at appt. Next week and will call if she has changes.  Items Reviewed: Did the pt receive and understand the discharge instructions provided? Yes  Medications obtained and verified? Yes  Other? No  Any new allergies since your discharge? No  Dietary orders reviewed? Yes Do you have support at home? Yes -My kids help me, but I do live alone.  Inquired if patient might need help at home or a home health nurse to assess her and she said she would think about it and speak to PCP next week.  Home Care and Equipment/Supplies: Were home health services ordered? no If so, what is the name of the agency? N/A  Has the agency set up a time to come to the patient's home? not applicable Were any new equipment or medical supplies ordered?  No What is the name of the medical supply agency? N/A Were you able to get the supplies/equipment? not applicable Do you have any questions related to the use of the equipment or supplies? No  Functional Questionnaire: (I = Independent and D = Dependent) ADLs: I  Bathing/Dressing- I  Meal Prep- I  Eating- I  Maintaining continence- I  Transferring/Ambulation- I  Managing Meds- I  Follow up appointments reviewed:  PCP Hospital f/u appt confirmed? Yes  Scheduled to see Jacolyn Reedy, NP on 01/23/22 @ 0930. [facility] f/u  appt confirmed? Yes  Scheduled to see [facility] for labs on 01/20/22. Are transportation arrangements needed? No  If their condition worsens, is the pt aware to call PCP or go to the Emergency Dept.? Yes Was the patient provided with contact information for the PCP's office or ED? Yes Was to pt encouraged to call back with questions or concerns? Yes

## 2022-01-17 NOTE — Telephone Encounter (Signed)
Patient stated she has a wound on her leg which is still weeping. Please advise.

## 2022-01-18 ENCOUNTER — Emergency Department (HOSPITAL_BASED_OUTPATIENT_CLINIC_OR_DEPARTMENT_OTHER)
Admission: EM | Admit: 2022-01-18 | Discharge: 2022-01-18 | Disposition: A | Discharge status: Home / Self Care | Payer: Medicare Other | Attending: Emergency Medicine | Admitting: Emergency Medicine

## 2022-01-18 ENCOUNTER — Encounter (HOSPITAL_BASED_OUTPATIENT_CLINIC_OR_DEPARTMENT_OTHER): Payer: Self-pay

## 2022-01-18 DIAGNOSIS — Z7901 Long term (current) use of anticoagulants: Secondary | ICD-10-CM | POA: Insufficient documentation

## 2022-01-18 DIAGNOSIS — K5900 Constipation, unspecified: Secondary | ICD-10-CM | POA: Diagnosis not present

## 2022-01-18 DIAGNOSIS — K649 Unspecified hemorrhoids: Secondary | ICD-10-CM | POA: Insufficient documentation

## 2022-01-18 DIAGNOSIS — I509 Heart failure, unspecified: Secondary | ICD-10-CM | POA: Diagnosis not present

## 2022-01-18 MED ORDER — FLEET ENEMA 7-19 GM/118ML RE ENEM
1.0000 | ENEMA | Freq: Once | RECTAL | Status: AC
  Administered 2022-01-18: 1 via RECTAL
  Filled 2022-01-18: qty 1

## 2022-01-18 MED ORDER — POLYETHYLENE GLYCOL 3350 17 GM/SCOOP PO POWD: 1.0000 | Freq: Once | ORAL | 0 refills | Status: AC

## 2022-01-18 NOTE — ED Triage Notes (Signed)
Pt presents to POV d/t being "impacted" reports she was just hospitalized and has not been taking her daily fiber.   Pt has not taking OTC medications.

## 2022-01-18 NOTE — ED Provider Notes (Signed)
Janice Small Provider Note   CSN: [id number] Arrival date & time: 01/18/22  1111     History  Chief Complaint  Patient presents with   Fecal Impaction    Janice Small is a 88 y.o. female.  Patient here constipation.  History of the same.  Was admitted to the hospital recently and was not on her bowel regimen.  She has a rectal stool ball that she can feel.  She has hemorrhoids.  She is not having any nausea or vomiting.  No abdominal pain.  History of heart failure.  Nothing makes it worse or better.  She has tried her home medications but has not tried MiraLAX or enema.  She would like to have enema.  Denies any fevers or chills.  The history is provided by the patient.       Home Medications Prior to Admission medications   Medication Sig Start Date End Date Taking? Authorizing Provider  polyethylene glycol powder (GLYCOLAX/MIRALAX) 17 GM/SCOOP powder Take 255 g by mouth once for 1 dose. 01/18/22 01/18/22 Yes Shelbey Spindler, DO  amLODipine (NORVASC) 5 MG tablet Take 1 tablet (5 mg total) by mouth daily. 01/16/22 02/15/22  Arrien, Jimmy Picket, MD  Bromfenac Sodium (PROLENSA) 0.07 % SOLN Place 1 drop into the left eye 4 (four) times daily. 11/21/21   Bernarda Caffey, MD  cloNIDine (CATAPRES) 0.1 MG tablet TAKE 1 TABLET BY MOUTH TWICE  DAILY 12/17/21   Early, Coralee Pesa, NP  dapagliflozin propanediol (FARXIGA) 10 MG TABS tablet Take 1 tablet (10 mg total) by mouth daily. 01/16/22 02/15/22  Arrien, Jimmy Picket, MD  furosemide (LASIX) 40 MG tablet Take 1 tablet (40 mg total) by mouth daily. 01/15/22 02/14/22  Arrien, Jimmy Picket, MD  levothyroxine (SYNTHROID) 75 MCG tablet Take 1 tablet (75 mcg total) by mouth daily. 07/18/21   [name], NP  MAGNESIUM PO Take 1 tablet by mouth daily.    [provider]  metoprolol succinate (TOPROL-XL) 25 MG 24 hr tablet Take 1 tablet (25 mg total) by mouth daily. 12/23/21   de [place], Blondell Reveal, MD  potassium chloride  (KLOR-CON M) 10 MEQ tablet Take 1 tablet (10 mEq total) by mouth daily. 01/16/22 02/15/22  Arrien, Jimmy Picket, MD  prednisoLONE acetate (PRED FORTE) 1 % ophthalmic suspension Place 1 drop into the left eye 4 (four) times daily. 11/21/21   Bernarda Caffey, MD  rivaroxaban (XARELTO) 20 MG TABS tablet Take 1 tablet (20 mg total) by mouth daily with supper. 12/23/21   de [place], Raymond J, MD  VITAMIN D PO Take 1 tablet by mouth daily.    [provider]      Allergies    Codeine phosphate    Review of Systems   Review of Systems  Physical Exam Updated Vital Signs BP 122/85   Pulse 95   Temp 97.9 F (36.6 C) (Oral)   Resp 14   SpO2 100%  Physical Exam Vitals and nursing note reviewed.  Constitutional:      General: She is not in acute distress.    Appearance: She is well-developed.  HENT:     Head: Normocephalic and atraumatic.  Eyes:     Conjunctiva/sclera: Conjunctivae normal.  Cardiovascular:     Rate and Rhythm: Normal rate and regular rhythm.     Heart sounds: No murmur heard. Pulmonary:     Effort: Pulmonary effort is normal. No respiratory distress.     Breath sounds: Normal breath sounds.  Abdominal:     Palpations: Abdomen is soft.     Tenderness: There is no abdominal tenderness.  Genitourinary:    Comments: Stool ball in rectal exam with nonthrombosed hemorrhoids Musculoskeletal:        General: No swelling.     Cervical back: Neck supple.  Skin:    General: Skin is warm and dry.     Capillary Refill: Capillary refill takes less than 2 seconds.  Neurological:     Mental Status: She is alert.  Psychiatric:        Mood and Affect: Mood normal.     ED Results / Procedures / Treatments   Labs (all labs ordered are listed, but only abnormal results are displayed) Labs Reviewed - No data to display  EKG None  Radiology No results found.  Procedures Fecal disimpaction  Date/Time: 01/18/2022 12:45 PM  Performed by: Janice Sites, DO Authorized  by: Janice Sites, DO  Consent: Verbal consent obtained. Risks and benefits: risks, benefits and alternatives were discussed Consent given by: patient Patient understanding: patient states understanding of the procedure being performed Patient consent: the patient's understanding of the procedure matches consent given Relevant documents: relevant documents present and verified Required items: required blood products, implants, devices, and special equipment available Patient identity confirmed: verbally with patient Time out: Immediately prior to procedure a "time out" was called to verify the correct patient, procedure, equipment, support staff and site/side marked as required. Preparation: Patient was prepped and draped in the usual sterile fashion. Local anesthesia used: no  Anesthesia: Local anesthesia used: no  Sedation: Patient sedated: no  Patient tolerance: patient tolerated the procedure well with no immediate complications       Medications Ordered in ED Medications  sodium phosphate (FLEET) 7-19 GM/118ML enema 1 enema (1 enema Rectal Given by Other 01/18/22 1145)    ED Course/ Medical Decision Making/ A&P                           Medical Decision Making Risk OTC drugs.   Frona Small is here with constipation.  Normal vitals.  No fever.  Differential diagnosis is constipation and have much lower suspicion for any other acute process.  She has no abdominal pain.  She has a rectal stool ball that I was able to disimpact.  She is given an enema.  She has no nausea or vomiting.  She has a history of heart failure.  She was recently admitted and was not on her stool regiment and she believes that is what caused her to get constipated.  She felt much better after disimpaction.  Recommend using MiraLAX.  Discharged in good condition.  Understands return precautions.  Normal vitals.  This chart was dictated using voice recognition software.  Despite best efforts to  proofread,  errors can occur which can change the documentation meaning.         Final Clinical Impression(s) / ED Diagnoses Final diagnoses:  Constipation, unspecified constipation type    Rx / DC Orders ED Discharge Orders          Ordered    polyethylene glycol powder (GLYCOLAX/MIRALAX) 17 GM/SCOOP powder   Once        01/18/22 1243              Mirren Gest, [place], DO 01/18/22 1246

## 2022-01-19 NOTE — Progress Notes (Signed)
Cardiology Office Note:    Date:  01/24/2022   ID:  Janice Small, DOB 08/16/1927, MRN [id number]  PCP:  [name], NP  Cardiologist:  Donato Heinz, MD  Electrophysiologist:  None   Referring MD: [name], NP   Chief Complaint  Patient presents with   Congestive Heart Failure    History of Present Illness:    Janice Small is a 88 y.o. female with a hx of chronic diastolic heart failure, hypertension, hypothyroidism who presents for follow-up.  She was referred by Dr. de [place] for evaluation of atrial fibrillation, initially seen on 01/09/2022.  Noted to be in A-fib at PCP clinic 12/23/2021.  Started on Xarelto 20 mg daily.  She reports that she has been having significant lower extremity, states that she has been putting bandages on her legs because of all the fluid weeping from her legs.  She also reports has been feeling short of breath.  Occurs with minimal exertion such as walking around her house.  Denies any chest pain.  Denies any lightheadedness or syncope.  No bleeding issues on Xarelto.  No smoking history.  Family history includes father had MI in 53s.  At initial clinic visit on 01/09/2022 was noted to be significantly volume overloaded.  She was sent to the ED and admitted for IV diuresis.  Echocardiogram 01/10/2022 showed EF 60 to 65%, normal RV function, moderate left atrial enlargement, mild to moderate mitral regurgitation.  She responded well to IV diuresis and was discharged on Lasix 40 mg daily along with Farxiga 10 mg daily.  Since discharge from the hospital, she reports has been doing okay.  States that weight has been stable.  She denies any chest pain, dyspnea, lighheadedness, syncope, or palpitations.  Does report having lower extremity edema but not as bad as before.  She reports even having intermittent bleeding from her hemorrhoids.   Wt Readings from Last 3 Encounters:  01/23/22 148 lb 3.2 oz (67.2 kg)  01/15/22 140 lb 14.4 oz (63.9 kg)   01/09/22 150 lb 4.8 oz (68.2 kg)     Past Medical History:  Diagnosis Date   BREAST BIOPSY, HX OF 11/27/2006   CHF (congestive heart failure) ([place])    COLONIC POLYPS, HX OF 11/27/2006   HEMORRHOIDS 01/17/2010   HYPERTENSION 11/27/2006   Hypertensive retinopathy    OU   HYPOTHYROIDISM 11/27/2006   Macular degeneration    Dry OU   MENOPAUSAL SYNDROME 11/27/2006   Vascular disease     Past Surgical History:  Procedure Laterality Date   ABDOMINAL HYSTERECTOMY     APPENDECTOMY     BREAST EXCISIONAL BIOPSY Right 1987   BREAST SURGERY     bx   CATARACT EXTRACTION Bilateral    EYE SURGERY Bilateral    Cat Sx OU   HEMORRHOID BANDING     TONSILLECTOMY      Current Medications: Current Meds  Medication Sig   amLODipine (NORVASC) 5 MG tablet Take 1 tablet (5 mg total) by mouth daily.   Bromfenac Sodium (PROLENSA) 0.07 % SOLN Place 1 drop into the left eye 4 (four) times daily.   cloNIDine (CATAPRES) 0.1 MG tablet TAKE 1 TABLET BY MOUTH TWICE  DAILY   dapagliflozin propanediol (FARXIGA) 10 MG TABS tablet Take 1 tablet (10 mg total) by mouth daily.   furosemide (LASIX) 40 MG tablet Take 1 tablet (40 mg total) by mouth daily.   levothyroxine (SYNTHROID) 75 MCG tablet Take 1 tablet (75 mcg total)  by mouth daily.   MAGNESIUM PO Take 1 tablet by mouth daily.   metoprolol succinate (TOPROL-XL) 25 MG 24 hr tablet Take 1 tablet (25 mg total) by mouth daily.   potassium chloride (KLOR-CON M) 10 MEQ tablet Take 1 tablet (10 mEq total) by mouth daily.   prednisoLONE acetate (PRED FORTE) 1 % ophthalmic suspension Place 1 drop into the left eye 4 (four) times daily.   rivaroxaban (XARELTO) 20 MG TABS tablet Take 1 tablet (20 mg total) by mouth daily with supper.   VITAMIN D PO Take 1 tablet by mouth daily.     Allergies:   Codeine phosphate   Social History   Socioeconomic History   Marital status: Widowed    Spouse name: Not on file   Number of children: Not on file   Years of  education: Not on file   Highest education level: Not on file  Occupational History   Not on file  Tobacco Use   Smoking status: Never   Smokeless tobacco: Never  Vaping Use   Vaping Use: Never used  Substance and Sexual Activity   Alcohol use: Never    Comment: rarely   Drug use: No   Sexual activity: Not Currently  Other Topics Concern   Not on file  Social History Narrative   The patient is retired she has been in [place] for more than 50 years she has 3 sons that she is widowed.   Never tobacco, no alcohol, 2 caffeinated beverages daily no drugs   Social Determinants of Health   Financial Resource Strain: Not on file  Food Insecurity: Not on file  Transportation Needs: Not on file  Physical Activity: Not on file  Stress: Not on file  Social Connections: Not on file     Family History: The patient's family history includes Heart disease in her father. There is no history of Colon cancer, Esophageal cancer, Pancreatic cancer, Liver cancer, or Stomach cancer.  ROS:   Please see the history of present illness.     All other systems reviewed and are negative.  EKGs/Labs/Other Studies Reviewed:    The following studies were reviewed today:   EKG:   01/09/22: Afib, rate 93, low voltage, Q wave in V1/2 01/23/22: Atrial fibrillation, rate 89, low voltage, nonspecific T wave flattening  Recent Labs: 12/23/2021: TSH 2.720 01/09/2022: ALT 43 01/23/2022: BNP WILL FOLLOW; BUN 31; Creatinine, Ser 1.25; Hemoglobin 12.5; Magnesium 2.4; Platelets 148; Potassium 4.6; Sodium 140  Recent Lipid Panel    Component Value Date/Time   CHOL 167 11/04/2021 1144   TRIG 58 11/04/2021 1144   HDL 86 11/04/2021 1144   CHOLHDL 1.9 11/04/2021 1144   CHOLHDL 2 12/03/2018 1010   VLDL 18.6 12/03/2018 1010   LDLCALC 69 11/04/2021 1144   LDLDIRECT 104.0 06/17/2013 1046    Physical Exam:    VS:  BP 128/60   Pulse 89   Ht 5' (1.524 m)   Wt 148 lb 3.2 oz (67.2 kg)   SpO2 97%   BMI 28.94  kg/m     Wt Readings from Last 3 Encounters:  01/23/22 148 lb 3.2 oz (67.2 kg)  01/15/22 140 lb 14.4 oz (63.9 kg)  01/09/22 150 lb 4.8 oz (68.2 kg)     GEN:  Well nourished, well developed in no acute distress HEENT: Normal NECK: No JVD; No carotid bruits CARDIAC: Irregular, normal rate no murmurs, rubs, gallops RESPIRATORY:  Clear to auscultation without rales, wheezing or rhonchi  ABDOMEN:  Soft, non-tender, non-distended MUSCULOSKELETAL:  1+ edema SKIN: Warm and dry NEUROLOGIC:  Alert and oriented x 3 PSYCHIATRIC:  Normal affect   ASSESSMENT:    1. Chronic diastolic heart failure ([place])   2. Atrial fibrillation, unspecified type ([place])   3. Essential hypertension     PLAN:    Chronic diastolic heart failure: At initial clinic visit on 01/09/2022 was noted to be significantly volume overloaded.  She was sent to the ED and admitted for IV diuresis.  Echocardiogram 01/10/2022 showed EF 60 to 65%, normal RV function, moderate left atrial enlargement, mild to moderate mitral regurgitation.  She responded well to IV diuresis and was discharged on Lasix 40 mg daily along with Farxiga 10 mg daily. -Continue Lasix 40 mg daily and Farxiga 10 mg daily.  Check BMET/magnesium.  Edema is improved from prior clinic visit but is worsened since she was discharged from the hospital.  No JVD and lungs are clear and  she reports weight is stable since discharge.  Check BNP.  Recommend wearing compression stockings.  Will give salty 6 handout, recommend limiting sodium intake to less than 2000 mg/day  Atrial fibrillation: New diagnosis 12/23/2021.  Could be contributing to heart failure as above -Continue Xarelto -Continue Toprol-XL 25 mg daily -She is reporting bleeding from hemorrhoids.  Will check CBC.  Recommend she follow-up with Dr. Carlean Purl who previously performed hemorrhoid banding for her.  Hypertension: On clonidine 0.1 mg twice daily, amlodipine 5 mg daily, Lasix 40 mg daily, Toprol-XL 25 mg  daily.  Previously on lisinopril 40 mg daily, was discontinued during admission 12/2021 due to AKI.  Appears controlled  Hypothyroidism: On levothyroxine 75 mcg daily.  TSH 2.72 on 12/23/2021   RTC in 1 month   Medication Adjustments/Labs and Tests Ordered: Current medicines are reviewed at length with the patient today.  Concerns regarding medicines are outlined above.  Orders Placed This Encounter  Procedures   Basic metabolic panel   Magnesium   CBC   Brain natriuretic peptide   LONG TERM MONITOR (3-14 DAYS)   EKG 12-Lead   No orders of the defined types were placed in this encounter.   Patient Instructions  Medication Instructions:  Your physician recommends that you continue on your current medications as directed. Please refer to the Current Medication list given to you today.  *If you need a refill on your cardiac medications before your next appointment, please call your pharmacy*   Lab Work: BMET, Mag, CBC, BNP today  If you have labs (blood work) drawn today and your tests are completely normal, you will receive your results only by: MyChart Message (if you have MyChart) OR A paper copy in the mail If you have any lab test that is abnormal or we need to change your treatment, we will call you to review the results.   Testing/Procedures: Bryn Gulling- Long Term Monitor Instructions   Your physician has requested you wear a ZIO patch monitor for _3_ days.  This is a single patch monitor.   [employer] supplies one patch monitor per enrollment. Additional stickers are not available. Please do not apply patch if you will be having a Nuclear Stress Test, Echocardiogram, Cardiac CT, MRI, or Chest Xray during the period you would be wearing the monitor. The patch cannot be worn during these tests. You cannot remove and re-apply the ZIO XT patch monitor.  Your ZIO patch monitor will be sent Fed Ex from [employer] directly to your home address. It may take  3-5 days to  receive your monitor after you have been enrolled.  Once you have received your monitor, please review the enclosed instructions. Your monitor has already been registered assigning a specific monitor serial # to you.  Billing and Patient Assistance Program Information   We have supplied [employer] with any of your insurance information on file for billing purposes. [employer] offers a sliding scale Patient Assistance Program for patients that do not have insurance, or whose insurance does not completely cover the cost of the ZIO monitor.   You must apply for the Patient Assistance Program to qualify for this discounted rate.     To apply, please call [employer] at [phone], select option 4, then select option 2, and ask to apply for Patient Assistance Program.  Theodore Demark will ask your household income, and how many people are in your household.  They will quote your out-of-pocket cost based on that information.  [employer] will also be able to set up a 25-month interest-free payment plan if needed.  Applying the monitor   Shave hair from upper left chest.  Hold abrader disc by orange tab. Rub abrader in 40 strokes over the upper left chest as indicated in your monitor instructions.  Clean area with 4 enclosed alcohol pads. Let dry.  Apply patch as indicated in monitor instructions. Patch will be placed under collarbone on left side of chest with arrow pointing upward.  Rub patch adhesive wings for 2 minutes. Remove white label marked "1". Remove the white label marked "2". Rub patch adhesive wings for 2 additional minutes.  While looking in a mirror, press and release button in center of patch. A small green light will flash 3-4 times. This will be your only indicator that the monitor has been turned on. ?  Do not shower for the first 24 hours. You may shower after the first 24 hours.  Press the button if you feel a symptom. You will hear a small click. Record Date, Time and Symptom in the Patient Logbook.   When you are ready to remove the patch, follow instructions on the last 2 pages of the Patient Logbook. Stick patch monitor onto the last page of Patient Logbook.  Place Patient Logbook in the blue and white box.  Use locking tab on box and tape box closed securely.  The blue and white box has prepaid postage on it. Please place it in the mailbox as soon as possible. Your physician should have your test results approximately 7 days after the monitor has [facility]  Call [place] [phone] you have questions regarding your ZIO XT patch monitor. Call them immediately if you see an orange light blinking on your monitor.  If your monitor falls off in less than 4 days, contact our Monitor department at [phone] ?If your monitor becomes loose or falls off after 4 days call [employer] at [phone] suggestions on securing your monitor.?   Follow-Up: [facility] you and your health needs are our priority.  As part of our continuing mission to provide you with exceptional heart care, we have created designated Provider Care Teams.  These Care Teams include your primary Cardiologist (physician) and Advanced Practice Providers (APPs -  Physician Assistants and Nurse Practitioners) who all work together to provide you with the care you need, when you need it.  We recommend signing up for the patient portal called "MyChart".  Sign up information is provided on this After Visit  Summary.  MyChart is used to connect with patients for Virtual Visits (Telemedicine).  Patients are able to view lab/test results, encounter notes, upcoming appointments, etc.  Non-urgent messages can be sent to your provider as well.   To learn more about what you can do with MyChart, go to [email or web address].    Your next appointment:   1 month(s)  The format for your next appointment:   In Person  Provider:   Donato Heinz, MD {            Signed, Donato Heinz, MD  01/24/2022 6:49 AM    [place]

## 2022-01-20 ENCOUNTER — Ambulatory Visit (INDEPENDENT_AMBULATORY_CARE_PROVIDER_SITE_OTHER): Payer: Medicare Other | Admitting: Ophthalmology

## 2022-01-20 ENCOUNTER — Encounter (INDEPENDENT_AMBULATORY_CARE_PROVIDER_SITE_OTHER): Payer: Self-pay | Admitting: Ophthalmology

## 2022-01-20 DIAGNOSIS — H35033 Hypertensive retinopathy, bilateral: Secondary | ICD-10-CM | POA: Diagnosis not present

## 2022-01-20 DIAGNOSIS — H34832 Tributary (branch) retinal vein occlusion, left eye, with macular edema: Secondary | ICD-10-CM | POA: Diagnosis not present

## 2022-01-20 DIAGNOSIS — I10 Essential (primary) hypertension: Secondary | ICD-10-CM | POA: Diagnosis not present

## 2022-01-20 DIAGNOSIS — H353131 Nonexudative age-related macular degeneration, bilateral, early dry stage: Secondary | ICD-10-CM | POA: Diagnosis not present

## 2022-01-20 DIAGNOSIS — H04123 Dry eye syndrome of bilateral lacrimal glands: Secondary | ICD-10-CM

## 2022-01-20 DIAGNOSIS — H40003 Preglaucoma, unspecified, bilateral: Secondary | ICD-10-CM

## 2022-01-20 DIAGNOSIS — Z961 Presence of intraocular lens: Secondary | ICD-10-CM

## 2022-01-20 MED ORDER — AFLIBERCEPT 2MG/0.05ML IZ SOLN FOR KALEIDOSCOPE
2.0000 mg | INTRAVITREAL | Status: AC | PRN
  Administered 2022-01-20: 2 mg via INTRAVITREAL

## 2022-01-20 NOTE — Telephone Encounter (Signed)
Is she monitoring her daily weight?  Is it staying stable?

## 2022-01-21 NOTE — Telephone Encounter (Signed)
Spoke to patient, patient reports this has cleared up, has not noticed weeping in a few days.   She does report weighing daily, reports weight is stable.  No SOB.    Advised to continue daily weights and keep follow up as scheduled 7/13.  Patient verbalized understanding.

## 2022-01-22 ENCOUNTER — Ambulatory Visit (HOSPITAL_BASED_OUTPATIENT_CLINIC_OR_DEPARTMENT_OTHER): Payer: Medicare Other | Admitting: Cardiology

## 2022-01-23 ENCOUNTER — Ambulatory Visit (INDEPENDENT_AMBULATORY_CARE_PROVIDER_SITE_OTHER): Payer: Medicare Other | Admitting: Cardiology

## 2022-01-23 ENCOUNTER — Encounter: Payer: Self-pay | Admitting: Cardiology

## 2022-01-23 ENCOUNTER — Ambulatory Visit (INDEPENDENT_AMBULATORY_CARE_PROVIDER_SITE_OTHER): Payer: Medicare Other

## 2022-01-23 ENCOUNTER — Ambulatory Visit (HOSPITAL_BASED_OUTPATIENT_CLINIC_OR_DEPARTMENT_OTHER): Payer: Medicare Other | Admitting: Nurse Practitioner

## 2022-01-23 VITALS — BP 128/60 | HR 89 | Ht 60.0 in | Wt 148.2 lb

## 2022-01-23 DIAGNOSIS — I4891 Unspecified atrial fibrillation: Secondary | ICD-10-CM | POA: Diagnosis not present

## 2022-01-23 DIAGNOSIS — I10 Essential (primary) hypertension: Secondary | ICD-10-CM

## 2022-01-23 DIAGNOSIS — I5032 Chronic diastolic (congestive) heart failure: Secondary | ICD-10-CM | POA: Diagnosis not present

## 2022-01-23 NOTE — Patient Instructions (Signed)
Medication Instructions:  Your physician recommends that you continue on your current medications as directed. Please refer to the Current Medication list given to you today.  *If you need a refill on your cardiac medications before your next appointment, please call your pharmacy*   Lab Work: BMET, Mag, CBC, BNP today  If you have labs (blood work) drawn today and your tests are completely normal, you will receive your results only by: MyChart Message (if you have MyChart) OR A paper copy in the mail If you have any lab test that is abnormal or we need to change your treatment, we will call you to review the results.   Testing/Procedures: Bryn Gulling- Long Term Monitor Instructions   Your physician has requested you wear a ZIO patch monitor for _3_ days.  This is a single patch monitor.   [employer] supplies one patch monitor per enrollment. Additional stickers are not available. Please do not apply patch if you will be having a Nuclear Stress Test, Echocardiogram, Cardiac CT, MRI, or Chest Xray during the period you would be wearing the monitor. The patch cannot be worn during these tests. You cannot remove and re-apply the ZIO XT patch monitor.  Your ZIO patch monitor will be sent Fed Ex from [employer] directly to your home address. It may take 3-5 days to receive your monitor after you have been enrolled.  Once you have received your monitor, please review the enclosed instructions. Your monitor has already been registered assigning a specific monitor serial # to you.  Billing and Patient Assistance Program Information   We have supplied [employer] with any of your insurance information on file for billing purposes. [employer] offers a sliding scale Patient Assistance Program for patients that do not have insurance, or whose insurance does not completely cover the cost of the ZIO monitor.   You must apply for the Patient Assistance Program to qualify for this discounted rate.     To apply,  please call [employer] at [phone], select option 4, then select option 2, and ask to apply for Patient Assistance Program.  Theodore Demark will ask your household income, and how many people are in your household.  They will quote your out-of-pocket cost based on that information.  [employer] will also be able to set up a 39-month interest-free payment plan if needed.  Applying the monitor   Shave hair from upper left chest.  Hold abrader disc by orange tab. Rub abrader in 40 strokes over the upper left chest as indicated in your monitor instructions.  Clean area with 4 enclosed alcohol pads. Let dry.  Apply patch as indicated in monitor instructions. Patch will be placed under collarbone on left side of chest with arrow pointing upward.  Rub patch adhesive wings for 2 minutes. Remove white label marked "1". Remove the white label marked "2". Rub patch adhesive wings for 2 additional minutes.  While looking in a mirror, press and release button in center of patch. A small green light will flash 3-4 times. This will be your only indicator that the monitor has been turned on. ?  Do not shower for the first 24 hours. You may shower after the first 24 hours.  Press the button if you feel a symptom. You will hear a small click. Record Date, Time and Symptom in the Patient Logbook.  When you are ready to remove the patch, follow instructions on the last 2 pages of the Patient Logbook. Stick patch monitor onto the last page of  Patient Logbook.  Place Patient Logbook in the blue and white box.  Use locking tab on box and tape box closed securely.  The blue and white box has prepaid postage on it. Please place it in the mailbox as soon as possible. Your physician should have your test results approximately 7 days after the monitor has been mailed back to [facility].  Call [place] at [phone] if you have questions regarding your ZIO XT patch monitor. Call them immediately if you see an  orange light blinking on your monitor.  If your monitor falls off in less than 4 days, contact our Monitor department at [phone]. ?If your monitor becomes loose or falls off after 4 days call [employer] at [phone] for suggestions on securing your monitor.?   Follow-Up: At [facility], you and your health needs are our priority.  As part of our continuing mission to provide you with exceptional heart care, we have created designated Provider Care Teams.  These Care Teams include your primary Cardiologist (physician) and Advanced Practice Providers (APPs -  Physician Assistants and Nurse Practitioners) who all work together to provide you with the care you need, when you need it.  We recommend signing up for the patient portal called "MyChart".  Sign up information is provided on this After Visit Summary.  MyChart is used to connect with patients for Virtual Visits (Telemedicine).  Patients are able to view lab/test results, encounter notes, upcoming appointments, etc.  Non-urgent messages can be sent to your provider as well.   To learn more about what you can do with MyChart, go to [email or web address].    Your next appointment:   1 month(s)  The format for your next appointment:   In Person  Provider:   Donato Heinz, MD {

## 2022-01-23 NOTE — Progress Notes (Unsigned)
Enrolled patient for a 3 day Zio XT monitor to be mailed to patients home  

## 2022-01-24 ENCOUNTER — Telehealth: Payer: Self-pay

## 2022-01-24 LAB — BASIC METABOLIC PANEL
BUN/Creatinine Ratio: 25 (ref 12–28)
BUN: 31 mg/dL (ref 10–36)
CO2: 26 mmol/L (ref 20–29)
Calcium: 10.9 mg/dL — ABNORMAL HIGH (ref 8.7–10.3)
Chloride: 100 mmol/L (ref 96–106)
Creatinine, Ser: 1.25 mg/dL — ABNORMAL HIGH (ref 0.57–1.00)
Glucose: 86 mg/dL (ref 70–99)
Potassium: 4.6 mmol/L (ref 3.5–5.2)
Sodium: 140 mmol/L (ref 134–144)
eGFR: 40 mL/min/{1.73_m2} — ABNORMAL LOW (ref >59)

## 2022-01-24 LAB — CBC
Hematocrit: 37.8 % (ref 34.0–46.6)
Hemoglobin: 12.5 g/dL (ref 11.1–15.9)
MCH: 31.7 pg (ref 26.6–33.0)
MCHC: 33.1 g/dL (ref 31.5–35.7)
MCV: 96 fL (ref 79–97)
Platelets: 148 10*3/uL — ABNORMAL LOW (ref 150–450)
RBC: 3.94 x10E6/uL (ref 3.77–5.28)
RDW: 14 % (ref 11.7–15.4)
WBC: 4.6 10*3/uL (ref 3.4–10.8)

## 2022-01-24 LAB — MAGNESIUM: Magnesium: 2.4 mg/dL — ABNORMAL HIGH (ref 1.6–2.3)

## 2022-01-24 LAB — BRAIN NATRIURETIC PEPTIDE: BNP: 358.7 pg/mL — ABNORMAL HIGH (ref 0.0–100.0)

## 2022-01-24 NOTE — Telephone Encounter (Signed)
-----   Message from Gatha Mayer, MD sent at 01/24/2022  7:23 AM EDT ----- Regarding: RE: Sharol Harness,  I will have my RN Remo Lipps (cced) get her in to see me sometime.   I suspect recent impaction had something to do with this.  Johny Chess please set her up in next available follow-up or banding spot with me.  Thanks   ----- Message ----- From: Donato Heinz, MD Sent: 01/24/2022   6:53 AM EDT To: Gatha Mayer, MD Subject: Hemorroids                                     Dr. Carlean Purl, Ms. Courser saw you earlier this year for hemorrhoid banding and reported significant improvement with this.  She has since been started on anticoagulation for atrial fibrillation and is now reporting bleeding again.  Her CBC is stable.  Just wanted to see if we can get her back in with you to evaluate her hemorrhoids? Thanks, Gerald Stabs

## 2022-01-24 NOTE — Telephone Encounter (Signed)
Pt was notified of Dr. Carlean Purl recommendations: Pt stated that she has been having rectal bleeding and that she is on blood thinners: Pt was scheduled for Office Visit with Dr. Carlean Purl on 02/25/2022 at 3:10 with Dr. Carlean Purl: Pt was made aware that this is Dr. Carlean Purl soonest appointment: Pt stated that she thinks that she needs to be seen prior than this date. Pt was notified that I will let Dr Carlean Purl know her concerns and inform her of what he recommends: Please advise

## 2022-01-26 DIAGNOSIS — I4891 Unspecified atrial fibrillation: Secondary | ICD-10-CM

## 2022-01-27 NOTE — Telephone Encounter (Signed)
Pt made aware of the appointment that is available this Thursday at 9:30 to see Dr. Carlean Purl: Pt states that she does not have transportation for that day and her hemorrhoids are doing ok at the moment:   Pt verbalized understanding with all questions answered.

## 2022-01-27 NOTE — Telephone Encounter (Signed)
It looks like we have 0930 on Thursday 7/17 now open so we can put her in there

## 2022-01-30 ENCOUNTER — Ambulatory Visit: Payer: Medicare Other | Admitting: Internal Medicine

## 2022-02-09 NOTE — Progress Notes (Unsigned)
Cardiology Office Note:    Date:  02/13/2022   ID:  Janice Small, DOB 1928/07/13, MRN [id number]  PCP:  [name], NP  Cardiologist:  Donato Heinz, MD  Electrophysiologist:  None   Referring MD: [name], NP   Chief Complaint  Patient presents with   Congestive Heart Failure    History of Present Illness:    Janice Small is a 88 y.o. female with a hx of chronic diastolic heart failure, hypertension, hypothyroidism who presents for follow-up.  She was referred by Dr. de [place] for evaluation of atrial fibrillation, initially seen on 01/09/2022.  Noted to be in A-fib at PCP clinic 12/23/2021.  Started on Xarelto 20 mg daily.  She reports that she has been having significant lower extremity, states that she has been putting bandages on her legs because of all the fluid weeping from her legs.  She also reports has been feeling short of breath.  Occurs with minimal exertion such as walking around her house.  Denies any chest pain.  Denies any lightheadedness or syncope.  No bleeding issues on Xarelto.  No smoking history.  Family history includes father had MI in 28s.  At initial clinic visit on 01/09/2022 was noted to be significantly volume overloaded.  She was sent to the ED and admitted for IV diuresis.  Echocardiogram 01/10/2022 showed EF 60 to 65%, normal RV function, moderate left atrial enlargement, mild to moderate mitral regurgitation.  She responded well to IV diuresis and was discharged on Lasix 40 mg daily along with Farxiga 10 mg daily.  Since last clinic visit, she reports that she has been doing okay.  Denies any chest pain, dyspnea, lightheadedness, syncope,  or palpitations.  Continues to have LE edema.  Has not been using compression stockings.  Continues to have intermittent hemorrhoid bleeding.  She reports she has been weighing herself daily and weight has been stable.   Wt Readings from Last 3 Encounters:  02/13/22 143 lb 12.8 oz (65.2 kg)  01/23/22 148  lb 3.2 oz (67.2 kg)  01/15/22 140 lb 14.4 oz (63.9 kg)     Past Medical History:  Diagnosis Date   BREAST BIOPSY, HX OF 11/27/2006   CHF (congestive heart failure) ([place])    COLONIC POLYPS, HX OF 11/27/2006   HEMORRHOIDS 01/17/2010   HYPERTENSION 11/27/2006   Hypertensive retinopathy    OU   HYPOTHYROIDISM 11/27/2006   Macular degeneration    Dry OU   MENOPAUSAL SYNDROME 11/27/2006   Vascular disease     Past Surgical History:  Procedure Laterality Date   ABDOMINAL HYSTERECTOMY     APPENDECTOMY     BREAST EXCISIONAL BIOPSY Right 1987   BREAST SURGERY     bx   CATARACT EXTRACTION Bilateral    EYE SURGERY Bilateral    Cat Sx OU   HEMORRHOID BANDING     TONSILLECTOMY      Current Medications: Current Meds  Medication Sig   amLODipine (NORVASC) 5 MG tablet Take 1 tablet (5 mg total) by mouth daily.   Bromfenac Sodium (PROLENSA) 0.07 % SOLN Place 1 drop into the left eye 4 (four) times daily.   cloNIDine (CATAPRES) 0.1 MG tablet TAKE 1 TABLET BY MOUTH TWICE  DAILY   furosemide (LASIX) 40 MG tablet Take 1 tablet (40 mg total) by mouth daily.   MAGNESIUM PO Take 1 tablet by mouth daily.   prednisoLONE acetate (PRED FORTE) 1 % ophthalmic suspension Place 1 drop into the left  eye 4 (four) times daily.   rivaroxaban (XARELTO) 20 MG TABS tablet Take 1 tablet (20 mg total) by mouth daily with supper.   SYNTHROID 75 MCG tablet TAKE 1 TABLET BY MOUTH DAILY   VITAMIN D PO Take 1 tablet by mouth daily.   [DISCONTINUED] dapagliflozin propanediol (FARXIGA) 10 MG TABS tablet Take 1 tablet (10 mg total) by mouth daily.   [DISCONTINUED] metoprolol succinate (TOPROL-XL) 25 MG 24 hr tablet Take 1 tablet (25 mg total) by mouth daily.   [DISCONTINUED] potassium chloride (KLOR-CON M) 10 MEQ tablet Take 1 tablet (10 mEq total) by mouth daily.     Allergies:   Codeine phosphate   Social History   Socioeconomic History   Marital status: Widowed    Spouse name: Not on file   Number of  children: Not on file   Years of education: Not on file   Highest education level: Not on file  Occupational History   Not on file  Tobacco Use   Smoking status: Never   Smokeless tobacco: Never  Vaping Use   Vaping Use: Never used  Substance and Sexual Activity   Alcohol use: Never    Comment: rarely   Drug use: No   Sexual activity: Not Currently  Other Topics Concern   Not on file  Social History Narrative   The patient is retired she has been in [place] for more than 50 years she has 3 sons that she is widowed.   Never tobacco, no alcohol, 2 caffeinated beverages daily no drugs   Social Determinants of Health   Financial Resource Strain: Not on file  Food Insecurity: Not on file  Transportation Needs: Not on file  Physical Activity: Not on file  Stress: Not on file  Social Connections: Not on file     Family History: The patient's family history includes Heart disease in her father. There is no history of Colon cancer, Esophageal cancer, Pancreatic cancer, Liver cancer, or Stomach cancer.  ROS:   Please see the history of present illness.     All other systems reviewed and are negative.  EKGs/Labs/Other Studies Reviewed:    The following studies were reviewed today:   EKG:   01/09/22: Afib, rate 93, low voltage, Q wave in V1/2 01/23/22: Atrial fibrillation, rate 89, low voltage, nonspecific T wave flattening  Recent Labs: 12/23/2021: TSH 2.720 01/09/2022: ALT 43 01/23/2022: BNP 358.7; BUN 31; Creatinine, Ser 1.25; Hemoglobin 12.5; Magnesium 2.4; Platelets 148; Potassium 4.6; Sodium 140  Recent Lipid Panel    Component Value Date/Time   CHOL 167 11/04/2021 1144   TRIG 58 11/04/2021 1144   HDL 86 11/04/2021 1144   CHOLHDL 1.9 11/04/2021 1144   CHOLHDL 2 12/03/2018 1010   VLDL 18.6 12/03/2018 1010   LDLCALC 69 11/04/2021 1144   LDLDIRECT 104.0 06/17/2013 1046    Physical Exam:    VS:  BP (!) 142/90   Pulse 86   Ht 5' (1.524 m)   Wt 143 lb 12.8 oz  (65.2 kg)   SpO2 96%   BMI 28.08 kg/m     Wt Readings from Last 3 Encounters:  02/13/22 143 lb 12.8 oz (65.2 kg)  01/23/22 148 lb 3.2 oz (67.2 kg)  01/15/22 140 lb 14.4 oz (63.9 kg)     GEN:  Well nourished, well developed in no acute distress HEENT: Normal NECK: + JVD CARDIAC: Irregular, normal rate no murmurs, rubs, gallops RESPIRATORY:  Clear to auscultation without rales, wheezing or rhonchi  ABDOMEN: Soft, non-tender, non-distended MUSCULOSKELETAL:  1+ edema SKIN: Warm and dry NEUROLOGIC:  Alert and oriented x 3 PSYCHIATRIC:  Normal affect   ASSESSMENT:    1. Chronic diastolic heart failure (HCC)   2. Persistent atrial fibrillation ([place])   3. Medication management   4. Essential hypertension   5. Hyperlipidemia, unspecified hyperlipidemia type      PLAN:    Chronic diastolic heart failure: At initial clinic visit on 01/09/2022 was noted to be significantly volume overloaded.  She was sent to the ED and admitted for IV diuresis.  Echocardiogram 01/10/2022 showed EF 60 to 65%, normal RV function, moderate left atrial enlargement, mild to moderate mitral regurgitation.  She responded well to IV diuresis and was discharged on Lasix 40 mg daily along with Farxiga 10 mg daily. -Continue Lasix 40 mg daily and Farxiga 10 mg daily.  Check BMET/magnesium.  Continues to have 1+ bilateral lower extremity edema.  Weight is down 5 pounds from prior clinic visit.  Recommend compression stockings but she declines.  Atrial fibrillation: New diagnosis 12/23/2021.  Could be contributing to heart failure as above -Continue Xarelto -Continue Toprol-XL 25 mg daily -She is reporting bleeding from hemorrhoids.  Has appointment coming up with Dr. Carlean Purl and GI.  Could consider cardioversion to restore sinus rhythm if contributing to heart failure as above, but would hold off at this time until not having any issues with her hemorrhoids.  Hypertension: On clonidine 0.1 mg twice daily, amlodipine 5  mg daily, Lasix 40 mg daily, Toprol-XL 25 mg daily.  Previously on lisinopril 40 mg daily, was discontinued during admission 12/2021 due to AKI.    Hypothyroidism: On levothyroxine 75 mcg daily.  TSH 2.72 on 12/23/2021   RTC in 1 month   Medication Adjustments/Labs and Tests Ordered: Current medicines are reviewed at length with the patient today.  Concerns regarding medicines are outlined above.  Orders Placed This Encounter  Procedures   Basic Metabolic Panel (BMET)   Magnesium   Pro b natriuretic peptide (BNP)   Meds ordered this encounter  Medications   dapagliflozin propanediol (FARXIGA) 10 MG TABS tablet    Sig: Take 1 tablet (10 mg total) by mouth daily.    Dispense:  30 tablet    Refill:  11   metoprolol succinate (TOPROL-XL) 25 MG 24 hr tablet    Sig: Take 1 tablet (25 mg total) by mouth daily.    Dispense:  90 tablet    Refill:  3   potassium chloride (KLOR-CON M) 10 MEQ tablet    Sig: Take 1 tablet (10 mEq total) by mouth daily.    Dispense:  90 tablet    Refill:  3    Patient Instructions  Medication Instructions:  Your physician recommends that you continue on your current medications as directed. Please refer to the Current Medication list given to you today.  *If you need a refill on your cardiac medications before your next appointment, please call your pharmacy*   Lab Work: TODAY: BMET, Mag, BNP  If you have labs (blood work) drawn today and your tests are completely normal, you will receive your results only by: [place] (if you have MyChart) OR A paper copy in the mail If you have any lab test that is abnormal or we need to change your treatment, we will call you to review the results.   Testing/Procedures: None   Follow-Up: At [facility], you and your health needs are our priority.  As part  of our continuing mission to provide you with exceptional heart care, we have created designated Provider Care Teams.  These Care Teams include your  primary Cardiologist (physician) and Advanced Practice Providers (APPs -  Physician Assistants and Nurse Practitioners) who all work together to provide you with the care you need, when you need it.  We recommend signing up for the patient portal called "MyChart".  Sign up information is provided on this After Visit Summary.  MyChart is used to connect with patients for Virtual Visits (Telemedicine).  Patients are able to view lab/test results, encounter notes, upcoming appointments, etc.  Non-urgent messages can be sent to your provider as well.   To learn more about what you can do with MyChart, go to [email or web address].    Your next appointment:   Sept 5th at 10am  The format for your next appointment:   In Person  Provider:   Donato Heinz, MD     Other Instructions   Important Information About Sugar         Signed, Donato Heinz, MD  02/13/2022 11:32 PM    [place]

## 2022-02-11 NOTE — Telephone Encounter (Signed)
Prolia VOB initiated via [email or web address]  Last Prolia inj 10/02/21 Next Prolia inj due 04/05/22   Prior Auth: APPROVED PA# [id number] Valid: valid 08/08/21-08/08/22

## 2022-02-13 ENCOUNTER — Other Ambulatory Visit: Payer: Self-pay

## 2022-02-13 ENCOUNTER — Ambulatory Visit (INDEPENDENT_AMBULATORY_CARE_PROVIDER_SITE_OTHER): Payer: Medicare Other | Admitting: Cardiology

## 2022-02-13 ENCOUNTER — Encounter: Payer: Self-pay | Admitting: Cardiology

## 2022-02-13 VITALS — BP 142/90 | HR 86 | Ht 60.0 in | Wt 143.8 lb

## 2022-02-13 DIAGNOSIS — I4819 Other persistent atrial fibrillation: Secondary | ICD-10-CM | POA: Diagnosis not present

## 2022-02-13 DIAGNOSIS — I5032 Chronic diastolic (congestive) heart failure: Secondary | ICD-10-CM

## 2022-02-13 DIAGNOSIS — I10 Essential (primary) hypertension: Secondary | ICD-10-CM | POA: Diagnosis not present

## 2022-02-13 DIAGNOSIS — Z79899 Other long term (current) drug therapy: Secondary | ICD-10-CM

## 2022-02-13 DIAGNOSIS — I4891 Unspecified atrial fibrillation: Secondary | ICD-10-CM

## 2022-02-13 DIAGNOSIS — E785 Hyperlipidemia, unspecified: Secondary | ICD-10-CM

## 2022-02-13 MED ORDER — METOPROLOL SUCCINATE ER 25 MG PO TB24: 25.0000 mg | ORAL_TABLET | Freq: Every day | ORAL | 3 refills | Status: DC

## 2022-02-13 MED ORDER — POTASSIUM CHLORIDE CRYS ER 10 MEQ PO TBCR: 10.0000 meq | EXTENDED_RELEASE_TABLET | Freq: Every day | ORAL | 3 refills | Status: DC

## 2022-02-13 MED ORDER — DAPAGLIFLOZIN PROPANEDIOL 10 MG PO TABS: 10.0000 mg | ORAL_TABLET | Freq: Every day | ORAL | 11 refills | Status: DC

## 2022-02-13 NOTE — Patient Instructions (Signed)
Medication Instructions:  Your physician recommends that you continue on your current medications as directed. Please refer to the Current Medication list given to you today.  *If you need a refill on your cardiac medications before your next appointment, please call your pharmacy*   Lab Work: TODAY: BMET, Mag, BNP  If you have labs (blood work) drawn today and your tests are completely normal, you will receive your results only by: [place] (if you have MyChart) OR A paper copy in the mail If you have any lab test that is abnormal or we need to change your treatment, we will call you to review the results.   Testing/Procedures: None   Follow-Up: At [facility] you and your health needs are our priority.  As part of our continuing mission to provide you with exceptional heart care, we have created designated Provider Care Teams.  These Care Teams include your primary Cardiologist (physician) and Advanced Practice Providers (APPs -  Physician Assistants and Nurse Practitioners) who all work together to provide you with the care you need, when you need it.  We recommend signing up for the patient portal called "MyChart".  Sign up information is provided on this After Visit Summary.  MyChart is used to connect with patients for Virtual Visits (Telemedicine).  Patients are able to view lab/test results, encounter notes, upcoming appointments, etc.  Non-urgent messages can be sent to your provider as well.   To learn more about what you can do with MyChart, go to [email or web address].    Your next appointment:   Sept 5th at 10am  The format for your next appointment:   In Person  Provider:   Donato Heinz, MD     Other Instructions   Important Information About Sugar

## 2022-02-13 NOTE — Telephone Encounter (Signed)
Prescription refill request for Xarelto received.  Indication: Atrial Fib Last office visit: 02/13/22  Levada Dy MD Weight: 65.2kg Age: 88 Scr: 1.25 on 01/23/22 CrCl: 29.56  Pt is on Xarelto '20mg'$  daily.  Based on above findings Xarelto '15mg'$  daily would be the appropriate dose.  Message sent to MD/PharmD for advise on dose reduction.  Will hold off on refill until then.

## 2022-02-14 ENCOUNTER — Other Ambulatory Visit: Payer: Self-pay | Admitting: Cardiology

## 2022-02-14 ENCOUNTER — Other Ambulatory Visit: Payer: Self-pay

## 2022-02-14 ENCOUNTER — Telehealth: Payer: Self-pay | Admitting: Cardiology

## 2022-02-14 DIAGNOSIS — I4891 Unspecified atrial fibrillation: Secondary | ICD-10-CM

## 2022-02-14 LAB — MAGNESIUM: Magnesium: 2.3 mg/dL (ref 1.6–2.3)

## 2022-02-14 LAB — BASIC METABOLIC PANEL
BUN/Creatinine Ratio: 25 (ref 12–28)
BUN: 29 mg/dL (ref 10–36)
CO2: 19 mmol/L — ABNORMAL LOW (ref 20–29)
Calcium: 10.5 mg/dL — ABNORMAL HIGH (ref 8.7–10.3)
Chloride: 100 mmol/L (ref 96–106)
Creatinine, Ser: 1.15 mg/dL — ABNORMAL HIGH (ref 0.57–1.00)
Glucose: 86 mg/dL (ref 70–99)
Potassium: 4.4 mmol/L (ref 3.5–5.2)
Sodium: 141 mmol/L (ref 134–144)
eGFR: 45 mL/min/{1.73_m2} — ABNORMAL LOW (ref >59)

## 2022-02-14 LAB — PRO B NATRIURETIC PEPTIDE: NT-Pro BNP: 2289 pg/mL — ABNORMAL HIGH (ref 0–738)

## 2022-02-14 MED ORDER — RIVAROXABAN 20 MG PO TABS: 20.0000 mg | ORAL_TABLET | Freq: Every day | ORAL | 1 refills | Status: DC

## 2022-02-14 NOTE — Telephone Encounter (Signed)
*  STAT* If patient is at the pharmacy, call can be transferred to refill team.   1. Which medications need to be refilled? (please list name of each medication and dose if known)   rivaroxaban (XARELTO) 20 MG TABS tablet    2. Which pharmacy/location (including street and city if local pharmacy) is medication to be sent to?  [id number]- [place], [place] - [address]  3. Do they need a 30 day or 90 day supply? [address] [place]

## 2022-02-14 NOTE — Telephone Encounter (Signed)
Prescription refill request for Xarelto received.  Indication:Afib Last office visit:8/23 Weight:65.2 kg Age:88 Scr:1.1 CrCl:33.59 ml/min  Awaiting decision on dose reduction

## 2022-02-17 ENCOUNTER — Telehealth (HOSPITAL_BASED_OUTPATIENT_CLINIC_OR_DEPARTMENT_OTHER): Payer: Self-pay | Admitting: *Deleted

## 2022-02-17 ENCOUNTER — Encounter (INDEPENDENT_AMBULATORY_CARE_PROVIDER_SITE_OTHER): Payer: Medicare Other | Admitting: Ophthalmology

## 2022-02-17 ENCOUNTER — Ambulatory Visit: Payer: Medicare Other | Admitting: Cardiology

## 2022-02-17 MED ORDER — RIVAROXABAN 15 MG PO TABS: 15.0000 mg | ORAL_TABLET | Freq: Every day | ORAL | 5 refills | Status: DC

## 2022-02-17 NOTE — Telephone Encounter (Signed)
Patient notified of dose reduction and New RX sent to pharmacy.

## 2022-02-17 NOTE — Telephone Encounter (Signed)
Yes we can reduce dose to 15 mg

## 2022-02-17 NOTE — Telephone Encounter (Signed)
Received message patient was calling to ask about "blood thinner"  Called patient, she stated she was wanting to know when her Xarelto would be ready however she had received notification from pharmacy they had received  Nothing further needed at this point

## 2022-02-18 ENCOUNTER — Encounter (INDEPENDENT_AMBULATORY_CARE_PROVIDER_SITE_OTHER): Payer: Medicare Other | Admitting: Ophthalmology

## 2022-02-18 NOTE — Progress Notes (Signed)
[facility] Note  02/21/2022    CHIEF COMPLAINT Patient presents for Retina Follow Up  HISTORY OF PRESENT ILLNESS: Janice Small is a 88 y.o. female who presents to the clinic today for:  HPI     Retina Follow Up   Patient presents with  CRVO/BRVO.  In left eye.  This started 4.5 weeks ago.  I, the attending physician,  performed the HPI with the patient and updated documentation appropriately.        Comments   Patient here for 4.5 weeks retina follow up for BRVO OS. Patient states vision about the same, not sure. No eye pain. Has had an infection on OS. Just finished 10 days of pill antibiotic about 1 1/2 weeks ago.       Last edited by Bernarda Caffey, MD on 02/21/2022 12:48 PM.    Pt states vision is stable, she had an infection OS and just finished antibiotics  Referring physician: [name], NP [address],  [place] [address]  HISTORICAL INFORMATION:   Selected notes from the MEDICAL RECORD NUMBER Referred by Dr. Jerline Pain for eval of BRVO OS LEE: 10.12.2021 Ocular Hx- Glc suspect   CURRENT MEDICATIONS: Current Outpatient Medications (Ophthalmic Drugs)  Medication Sig   Bromfenac Sodium (PROLENSA) 0.07 % SOLN Place 1 drop into the left eye 4 (four) times daily.   prednisoLONE acetate (PRED FORTE) 1 % ophthalmic suspension Place 1 drop into the left eye 4 (four) times daily.   No current facility-administered medications for this visit. (Ophthalmic Drugs)   Current Outpatient Medications (Other)  Medication Sig   cloNIDine (CATAPRES) 0.1 MG tablet TAKE 1 TABLET BY MOUTH TWICE  DAILY   dapagliflozin propanediol (FARXIGA) 10 MG TABS tablet Take 1 tablet (10 mg total) by mouth daily.   MAGNESIUM PO Take 1 tablet by mouth daily.   metoprolol succinate (TOPROL-XL) 25 MG 24 hr tablet Take 1 tablet (25 mg total) by mouth daily.   potassium chloride (KLOR-CON M) 10 MEQ tablet Take 1 tablet (10 mEq total) by mouth  daily.   Rivaroxaban (XARELTO) 15 MG TABS tablet Take 1 tablet (15 mg total) by mouth daily with supper.   SYNTHROID 75 MCG tablet TAKE 1 TABLET BY MOUTH DAILY   VITAMIN D PO Take 1 tablet by mouth daily.   amLODipine (NORVASC) 5 MG tablet Take 1 tablet (5 mg total) by mouth daily.   furosemide (LASIX) 40 MG tablet Take 1 tablet (40 mg total) by mouth daily.   No current facility-administered medications for this visit. (Other)   REVIEW OF SYSTEMS: ROS   Positive for: Gastrointestinal, Musculoskeletal, Eyes Negative for: Constitutional, Neurological, Skin, Genitourinary, HENT, Endocrine, Cardiovascular, Respiratory, Psychiatric, Allergic/Imm, Heme/Lymph Last edited by Theodore Demark, COA on 02/21/2022  9:38 AM.     ALLERGIES Allergies  Allergen Reactions   Codeine Phosphate     REACTION: unspecified   PAST MEDICAL HISTORY Past Medical History:  Diagnosis Date   BREAST BIOPSY, HX OF 11/27/2006   CHF (congestive heart failure) ([place])    COLONIC POLYPS, HX OF 11/27/2006   HEMORRHOIDS 01/17/2010   HYPERTENSION 11/27/2006   Hypertensive retinopathy    OU   HYPOTHYROIDISM 11/27/2006   Macular degeneration    Dry OU   MENOPAUSAL SYNDROME 11/27/2006   Vascular disease    Past Surgical History:  Procedure Laterality Date   ABDOMINAL HYSTERECTOMY     APPENDECTOMY     BREAST EXCISIONAL BIOPSY  Right 1987   BREAST SURGERY     bx   CATARACT EXTRACTION Bilateral    EYE SURGERY Bilateral    Cat Sx OU   HEMORRHOID BANDING     TONSILLECTOMY     FAMILY HISTORY Family History  Problem Relation Age of Onset   Heart disease Father    Colon cancer Neg Hx    Esophageal cancer Neg Hx    Pancreatic cancer Neg Hx    Liver cancer Neg Hx    Stomach cancer Neg Hx    SOCIAL HISTORY Social History   Tobacco Use   Smoking status: Never   Smokeless tobacco: Never  Vaping Use   Vaping Use: Never used  Substance Use Topics   Alcohol use: Never    Comment: rarely   Drug use: No        OPHTHALMIC EXAM:  Base Eye Exam     Visual Acuity (Snellen - Linear)       Right Left   Dist cc 20/20 -2 20/40   Dist ph cc  20/30    Correction: Glasses         Tonometry (Tonopen, 9:35 AM)       Right Left   Pressure 16 20         Pupils       Dark Light Shape React APD   Right 3 2 Round Minimal None   Left 3 2 Round Minimal None         Visual Fields (Counting fingers)       Left Right    Full Full         Extraocular Movement       Right Left    Full, Ortho Full, Ortho         Neuro/Psych     Oriented x3: Yes   Mood/Affect: Normal         Dilation     Both eyes: 1.0% Mydriacyl, 2.5% Phenylephrine @ 9:35 AM           Slit Lamp and Fundus Exam     Slit Lamp Exam       Right Left   Lids/Lashes Dermatochalasis - upper lid Dermatochalasis - upper lid, mild Meibomian gland dysfunction   Conjunctiva/Sclera White and quiet White and quiet   Cornea 2+ inferior Punctate epithelial erosions, well healed temporal cataract wounds, arcus, early band K nasal and temporal, tear film debris 3+ inferior Punctate epithelial erosions, irregular epi, well healed temporal cataract wounds, arcus, tear film debris   Anterior Chamber Deep and quiet Deep and quiet   Iris Round and dilated Round and dilated   Lens 3 piece PC IOL in good position with open PC 3 piece PC IOL in good position, 1+PCO (non-central)   Anterior Vitreous Vitreous syneresis Vitreous syneresis, Posterior vitreous detachment         Fundus Exam       Right Left   Disc Pink and Sharp, Compact mild Pallor, Sharp rim, temporal PPA, Compact   C/D Ratio 0.5 0.4   Macula Flat, Blunted foveal reflex, mild Drusen, RPE mottling and clumping, no heme or edema Blunted foveal reflex, focal IRH and exudates temporal macula -- persistent, cystic changes and edema -- persistent, +laser changes, Drusen   Vessels attenuated, mild tortuosity Tortuous, severe attenuation of vessels superior  macula   Periphery Attached, No heme  Attached, mild reticular degeneration           Refraction  Wearing Rx       Sphere Cylinder Axis Add   Right -1.00 +0.50 027 +2.50   Left -0.75 +0.75 083 +2.50    Type: progressive           IMAGING AND PROCEDURES  Imaging and Procedures for 02/21/2022  OCT, Retina - OU - Both Eyes       Right Eye Quality was good. Central Foveal Thickness: 269. Progression has been stable. Findings include normal foveal contour, no IRF, no SRF, retinal drusen .   Left Eye Quality was good. Central Foveal Thickness: 268. Progression has been stable. Findings include normal foveal contour, no SRF, retinal drusen , intraretinal hyper-reflective material, intraretinal fluid (Persistent IRF/IRHM temporal macula and fovea -- ?slight improvement).   Notes *Images captured and stored on drive  Diagnosis / Impression:  OD: non-exu ARMD w/ fine drusen OS: BRVO w/ CME -- persistent IRF/IRHM temporal macula and fovea - ?slight improvement  Clinical management:  See below  Abbreviations: NFP - Normal foveal profile. CME - cystoid macular edema. PED - pigment epithelial detachment. IRF - intraretinal fluid. SRF - subretinal fluid. EZ - ellipsoid zone. ERM - epiretinal membrane. ORA - outer retinal atrophy. ORT - outer retinal tubulation. SRHM - subretinal hyper-reflective material. IRHM - intraretinal hyper-reflective material      Intravitreal Injection, Pharmacologic Agent - OS - Left Eye       Time Out 02/21/2022. 10:21 AM. Confirmed correct patient, procedure, site, and patient consented.   Anesthesia Topical anesthesia was used. Anesthetic medications included Lidocaine 2%, Proparacaine 0.5%.   Procedure Preparation included 5% betadine to ocular surface, eyelid speculum. A (32g) needle was used.   Injection: 2 mg aflibercept 2 MG/0.05ML   Route: Intravitreal, Site: Left Eye   NDC: [id number], Lot: [id number], Expiration date:  04/13/2023, Waste: 0 mL   Post-op Post injection exam found visual acuity of at least counting fingers. The patient tolerated the procedure well. There were no complications. The patient received written and verbal post procedure care education. Post injection medications were not given.            ASSESSMENT/PLAN:    ICD-10-CM   1. Branch retinal vein occlusion of left eye with macular edema  H34.8320 OCT, Retina - OU - Both Eyes    Intravitreal Injection, Pharmacologic Agent - OS - Left Eye    aflibercept (EYLEA) SOLN 2 mg    2. Essential hypertension  I10     3. Hypertensive retinopathy of both eyes  H35.033     4. Early dry stage nonexudative age-related macular degeneration of both eyes  H35.3131     5. Pseudophakia, both eyes  Z96.1     6. Glaucoma suspect of both eyes  H40.003     7. Dry eyes  H04.123      1. BRVO w/ CME OS             - S/P IVA OS #1 (10.18.21), #2 (11.15.21), #3 (12.13.21), #4 (01.10.22), #5 (02.07.22), #6 (03.07.22), #7 (04.04.22), #8 (5.2.22) -- IVA resistance  - S/P IVE OS #1 (05.31.22) -- sample, #2 (06.28.22), #3 (07.26.22), #4 (08.23.22), #5 (09.20.22), #6 (10.20.22), #7 (11.17.22), #8 (12.15.22), #9 (01.12.23), #10 (02.09.23), #11 (03.16.23), #12 (04.13.23), #13 (05.11.23), #14 (06.08.23)             - S/P focal laser OS (02.16.22) - BCVA stable at 20/30 - OCT shows persistent IRF/IRHM temporal macula and fovea -- slightly improved - reduced PF/Prolensa (for CME component)  to BID OS due to steroid response - recommend IVE OS #15 today, 07.10.23 - pt wishes to proceed - RBA of procedure discussed, questions answered - informed consent obtained and signed - see procedure note - [place]ast[place] informed consent obtained and signed, 10.18.21 (OS) - Eylea informed consent obtained and signed, 05.31.22 - [facility] # [id number], 07/14/2021 - 07/13/2022  - cont PF/Prolensa to BID OS - F/U 4-5 weeks, DFE, OCT, possible injection   2,3. Hypertensive  retinopathy OU - discussed importance of tight BP control - monitor  4. Age related macular degeneration, non-exudative, both eyes  - The incidence, anatomy, and pathology of dry AMD, risk of progression, and the AREDS and AREDS 2 study including smoking risks discussed with patient.   - recommend Amsler grid monitoring  5. Pseudophakia OU  - s/p CE/IOL  - IOL in good position, doing well  - monitor  6. Glaucoma Suspect  - IOP 16,20  - discussed the possibility of starting a glaucoma drop in the future    - under the expert care of Dr. Jerline Pain  7. Dry eyes OU (OS>OD) - recommend artificial tears and lubricating ointment as needed  Ophthalmic Meds Ordered this visit:  Meds ordered this encounter  Medications   aflibercept (EYLEA) SOLN 2 mg     Return for f/u 4-5 weeks, BRVO OS, DFE, OCT.  There are no Patient Instructions on file for this visit.  This document serves as a record of services personally performed by Gardiner Sleeper, MD, PhD. It was created on their behalf by San Jetty. Owens Shark, OA an ophthalmic technician. The creation of this record is the provider's dictation and/or activities during the visit.    Electronically signed by: San Jetty. Owens Shark, [place] 08.08.2023 12:52 PM  Gardiner Sleeper, M.D., Ph.D. Diseases & Surgery of the Retina and Vitreous [facility]  I have reviewed the above documentation for accuracy and completeness, and I agree with the above. Gardiner Sleeper, M.D., Ph.D. 02/21/22 12:52 PM   Abbreviations: M myopia (nearsighted); A astigmatism; H hyperopia (farsighted); P presbyopia; Mrx spectacle prescription;  CTL contact lenses; OD right eye; OS left eye; OU both eyes  XT exotropia; ET esotropia; PEK punctate epithelial keratitis; PEE punctate epithelial erosions; DES dry eye syndrome; MGD meibomian gland dysfunction; ATs artificial tears; PFAT's preservative free artificial tears; [place] nuclear sclerotic cataract; PSC posterior  subcapsular cataract; ERM epi-retinal membrane; PVD posterior vitreous detachment; RD retinal detachment; DM diabetes mellitus; DR diabetic retinopathy; NPDR non-proliferative diabetic retinopathy; PDR proliferative diabetic retinopathy; CSME clinically significant macular edema; DME diabetic macular edema; dbh dot blot hemorrhages; CWS cotton wool spot; POAG primary open angle glaucoma; C/D cup-to-disc ratio; HVF humphrey visual field; GVF goldmann visual field; OCT optical coherence tomography; IOP intraocular pressure; BRVO Branch retinal vein occlusion; CRVO central retinal vein occlusion; CRAO central retinal artery occlusion; BRAO branch retinal artery occlusion; RT retinal tear; SB scleral buckle; PPV pars plana vitrectomy; VH Vitreous hemorrhage; PRP panretinal laser photocoagulation; IVK intravitreal kenalog; VMT vitreomacular traction; MH Macular hole;  NVD neovascularization of the disc; NVE neovascularization elsewhere; AREDS age related eye disease study; ARMD age related macular degeneration; POAG primary open angle glaucoma; EBMD epithelial/anterior basement membrane dystrophy; ACIOL anterior chamber intraocular lens; IOL intraocular lens; PCIOL posterior chamber intraocular lens; Phaco/IOL phacoemulsification with intraocular lens placement; [place] photorefractive keratectomy; LASIK laser assisted in situ keratomileusis; HTN hypertension; DM diabetes mellitus; COPD chronic obstructive pulmonary disease

## 2022-02-19 ENCOUNTER — Encounter (INDEPENDENT_AMBULATORY_CARE_PROVIDER_SITE_OTHER): Payer: Medicare Other | Admitting: Ophthalmology

## 2022-02-19 DIAGNOSIS — H35033 Hypertensive retinopathy, bilateral: Secondary | ICD-10-CM

## 2022-02-19 DIAGNOSIS — H40003 Preglaucoma, unspecified, bilateral: Secondary | ICD-10-CM

## 2022-02-19 DIAGNOSIS — H34832 Tributary (branch) retinal vein occlusion, left eye, with macular edema: Secondary | ICD-10-CM

## 2022-02-19 DIAGNOSIS — H353131 Nonexudative age-related macular degeneration, bilateral, early dry stage: Secondary | ICD-10-CM

## 2022-02-19 DIAGNOSIS — I10 Essential (primary) hypertension: Secondary | ICD-10-CM

## 2022-02-19 DIAGNOSIS — Z961 Presence of intraocular lens: Secondary | ICD-10-CM

## 2022-02-19 DIAGNOSIS — H04123 Dry eye syndrome of bilateral lacrimal glands: Secondary | ICD-10-CM

## 2022-02-20 ENCOUNTER — Telehealth: Payer: Self-pay | Admitting: Cardiology

## 2022-02-20 ENCOUNTER — Encounter: Payer: Self-pay | Admitting: *Deleted

## 2022-02-20 NOTE — Telephone Encounter (Signed)
Prior auth required for PROLIA  PA PROCESS DETAILS: Effective 07/14/2021 if the patient is new to Prolia, Prior authorization and Step Therapy are required & not on file. Please go to [email or web address] or call [phone] to initiate the prior authorization. For exception to the policy please visit [email or web address] and review Policy Number [id number] 

## 2022-02-20 NOTE — Telephone Encounter (Signed)
Pt c/o medication issue:  1. Name of Medication:   Rivaroxaban (XARELTO) 15 MG TABS tablet  2. How are you currently taking this medication (dosage and times per day)? New lower dosage as prescribed  3. Are you having a reaction (difficulty breathing--STAT)?   N/A  4. What is your medication issue?    Patient called stating she received the wrong dosage ('20mg'$ ) of this medication from [facility] and she does not want future prescriptions sent to that pharmacy.

## 2022-02-20 NOTE — Telephone Encounter (Signed)
Spoke to patient she stated Xarelto was changed to 15 mg daily.Stated she does not want to use [facility] mail order.Stated she has new Xarelto 15 mg prescription.Stated everything taken care of.

## 2022-02-21 ENCOUNTER — Ambulatory Visit (INDEPENDENT_AMBULATORY_CARE_PROVIDER_SITE_OTHER): Payer: Medicare Other | Admitting: Ophthalmology

## 2022-02-21 ENCOUNTER — Encounter (INDEPENDENT_AMBULATORY_CARE_PROVIDER_SITE_OTHER): Payer: Self-pay | Admitting: Ophthalmology

## 2022-02-21 DIAGNOSIS — H353131 Nonexudative age-related macular degeneration, bilateral, early dry stage: Secondary | ICD-10-CM | POA: Diagnosis not present

## 2022-02-21 DIAGNOSIS — H40003 Preglaucoma, unspecified, bilateral: Secondary | ICD-10-CM

## 2022-02-21 DIAGNOSIS — I10 Essential (primary) hypertension: Secondary | ICD-10-CM | POA: Diagnosis not present

## 2022-02-21 DIAGNOSIS — H04123 Dry eye syndrome of bilateral lacrimal glands: Secondary | ICD-10-CM

## 2022-02-21 DIAGNOSIS — H35033 Hypertensive retinopathy, bilateral: Secondary | ICD-10-CM

## 2022-02-21 DIAGNOSIS — H34832 Tributary (branch) retinal vein occlusion, left eye, with macular edema: Secondary | ICD-10-CM

## 2022-02-21 DIAGNOSIS — Z961 Presence of intraocular lens: Secondary | ICD-10-CM

## 2022-02-21 MED ORDER — AFLIBERCEPT 2MG/0.05ML IZ SOLN FOR KALEIDOSCOPE
2.0000 mg | INTRAVITREAL | Status: AC | PRN
  Administered 2022-02-21: 2 mg via INTRAVITREAL

## 2022-02-24 ENCOUNTER — Telehealth: Payer: Self-pay | Admitting: Cardiology

## 2022-02-24 DIAGNOSIS — Z79899 Other long term (current) drug therapy: Secondary | ICD-10-CM

## 2022-02-24 DIAGNOSIS — R600 Localized edema: Secondary | ICD-10-CM

## 2022-02-24 DIAGNOSIS — I5032 Chronic diastolic (congestive) heart failure: Secondary | ICD-10-CM

## 2022-02-24 NOTE — Telephone Encounter (Signed)
Called patient, she states she is continuing to have LE edema. Recently seen by Dr.Schumann on 08/03- he advised in his note of the LE edema, and advised to continue lasix 40 mg daily (which she is doing) however, patient states now the swelling is worse and spreading up her leg close to her knee. Patient states that she does elevate her legs and it gets better overnight, but comes back during the day. She does not use compression stockings (she has sores on her legs that prevents her from using these). She denies any salt in her diet she is very strict about what she eats. Patient states her weight is 142 lb today- last OV note states weight was 143. Patient does mention having pitting edema in her legs. She states her left knee is more swollen but she believes this is because of arthritis. I advised I would route to MD to advise if he recommended any change to medications. Patient aware to continue to elevate legs at this time.

## 2022-02-24 NOTE — Telephone Encounter (Signed)
Recommend increasing Lasix to 80 mg daily.  Check BMET/magnesium/BNP in 1 week.  Continue daily weights.

## 2022-02-24 NOTE — Telephone Encounter (Signed)
Patient is calling to talk to Dr. Gardiner Rhyme or nurse

## 2022-02-25 ENCOUNTER — Other Ambulatory Visit: Payer: Self-pay | Admitting: *Deleted

## 2022-02-25 ENCOUNTER — Ambulatory Visit (INDEPENDENT_AMBULATORY_CARE_PROVIDER_SITE_OTHER): Payer: Medicare Other | Admitting: Internal Medicine

## 2022-02-25 ENCOUNTER — Encounter: Payer: Self-pay | Admitting: Internal Medicine

## 2022-02-25 VITALS — BP 148/90 | HR 72 | Ht 60.0 in | Wt 142.0 lb

## 2022-02-25 DIAGNOSIS — I5032 Chronic diastolic (congestive) heart failure: Secondary | ICD-10-CM

## 2022-02-25 DIAGNOSIS — K644 Residual hemorrhoidal skin tags: Secondary | ICD-10-CM

## 2022-02-25 DIAGNOSIS — K648 Other hemorrhoids: Secondary | ICD-10-CM

## 2022-02-25 DIAGNOSIS — R600 Localized edema: Secondary | ICD-10-CM

## 2022-02-25 DIAGNOSIS — Z79899 Other long term (current) drug therapy: Secondary | ICD-10-CM

## 2022-02-25 NOTE — Patient Instructions (Signed)
HEMORRHOID BANDING PROCEDURE    FOLLOW-UP CARE   The procedure you have had should have been relatively painless since the banding of the area involved does not have nerve endings and there is no pain sensation.  The rubber band cuts off the blood supply to the hemorrhoid and the band may fall off as soon as 48 hours after the banding (the band may occasionally be seen in the toilet bowl following a bowel movement). You may notice a temporary feeling of fullness in the rectum which should respond adequately to plain Tylenol or Motrin.  Following the banding, avoid strenuous exercise that evening and resume full activity the next day.  A sitz bath (soaking in a warm tub) or bidet is soothing, and can be useful for cleansing the area after bowel movements.     To avoid constipation, take two tablespoons of natural wheat bran, natural oat bran, flax, Benefiber or any over the counter fiber supplement and increase your water intake to 7-8 glasses daily.    Unless you have been prescribed anorectal medication, do not put anything inside your rectum for two weeks: No suppositories, enemas, fingers, etc.  Occasionally, you may have more bleeding than usual after the banding procedure.  This is often from the untreated hemorrhoids rather than the treated one.  Don't be concerned if there is a tablespoon or so of blood.  If there is more blood than this, lie flat with your bottom higher than your head and apply an ice pack to the area. If the bleeding does not stop within a half an hour or if you feel faint, call our office at [phone] or go to the emergency room.  Problems are not common; however, if there is a substantial amount of bleeding, severe pain, chills, fever or difficulty passing urine (very rare) or other problems, you should call us at [phone] or report to the nearest emergency room.  Do not stay seated continuously for more than 2-3 hours for a day or two after the procedure.   Tighten your buttock muscles 10-15 times every two hours and take 10-15 deep breaths every 1-2 hours.  Do not spend more than a few minutes on the toilet if you cannot empty your bowel; instead re-visit the toilet at a later time.  I appreciate the opportunity to care for you. Carl Gessner, MD, FACG  

## 2022-02-25 NOTE — Progress Notes (Signed)
   HEMORRHOID LIGATION  Patient with a history of up bleeding hemorrhoids status post banding of the right anterior and left lateral internal components in March 2023.  Here because of some persistent bleeding.  Minor she says but she is now on anticoagulation after A-fib diagnosis and cardiology suggested she come back to see me to address this.  As long as she uses her Metamucil and does not get constipated or straining or does not have multiple bowel movements she does not tend to bleed.  It is similar to bleeding she has had in the past and not great amounts.  It is probably not as severe as it was prior to the banding in March.   Patti [place], [place] present  Rectal exam reveals external hemorrhoids all 3 positions, there is a grade 3 prolapsed internal component of the right anterior seen as well the right anterior external hemorrhoid is also slightly friable.  Anoscopic exam shows the same, with grade 2 left lateral and right posterior internal hemorrhoids.  There is stigmata of bleeding on the right anterior internal component.   We had a discussion the patient preferred to have this hemorrhoid that I think is symptomatic banded.   PROCEDURE NOTE: The patient presents with symptomatic grade 3  hemorrhoids, requesting rubber band ligation of his/her hemorrhoidal disease.  All risks, benefits and alternative forms of therapy were described and informed consent was obtained.   The anorectum was pre-medicated with 0.125% nitroglycerin and 5% lidocaine The decision was made to band the right anterior internal hemorrhoid, and the [place] was used to perform band ligation without complication.  Digital anorectal examination was then performed to assure proper positioning of the band, and to adjust the banded tissue as required.  The patient was discharged home without pain or other issues.  Dietary and behavioral recommendations were given and along with follow-up instructions.      The following adjunctive treatments were recommended:  Continue Metamucil  The patient will return as needed for follow-up and possible additional banding as required. No complications were encountered and the patient tolerated the procedure well.

## 2022-02-25 NOTE — Telephone Encounter (Signed)
Pt ready for scheduling on or after 04/05/22   Out-of-pocket cost due at time of visit: $301   Primary: [employer] Prolia co-insurance: 20% (approximately $276) Admin fee co-insurance: 20% (approximately $25)   Secondary: n/a Prolia co-insurance:  Admin fee co-insurance:    Deductible: does not apply   Prior Auth: APPROVED PA# [id number] Valid: valid 08/08/21-08/08/22   ** This summary of benefits is an estimation of the patient's out-of-pocket cost. Exact cost may vary based on individual plan coverage.

## 2022-02-25 NOTE — Telephone Encounter (Signed)
Spoke to patient, aware of recommendations and verbalized understanding.  Orders placed for labs in 1 week

## 2022-03-05 ENCOUNTER — Telehealth: Payer: Self-pay | Admitting: Internal Medicine

## 2022-03-05 NOTE — Telephone Encounter (Signed)
Pt states she had some bleeding this am from her hemorrhoids for no reason as she was not having a BM. States she is on a blood thinner and thinks she needs to see Dr. Carlean Purl as there was a hem he could not get to for banding. Pt scheduled to see Dr. Carlean Purl 04/01/22 at 2:50pm. Pt aware of appt.

## 2022-03-05 NOTE — Telephone Encounter (Signed)
Patient called and states she is having hemorrhoidal bleeding and is in pain. Please advise, thank you.

## 2022-03-08 ENCOUNTER — Other Ambulatory Visit (HOSPITAL_BASED_OUTPATIENT_CLINIC_OR_DEPARTMENT_OTHER): Payer: Self-pay | Admitting: Family Medicine

## 2022-03-08 DIAGNOSIS — I4891 Unspecified atrial fibrillation: Secondary | ICD-10-CM

## 2022-03-13 ENCOUNTER — Telehealth: Payer: Self-pay | Admitting: Cardiology

## 2022-03-13 NOTE — Telephone Encounter (Signed)
Patient stated she is down 1 1/2 pounds today. Her edema and soreness are in her left knee. It swells as the day goes on. She denies ankle swelling more than usual, sob, or chest pain. Recommended she contact PCP. Recommended that she use ice packs throughout the day and esp. before going to bed. Patient education on protecting skin by using a towel between the ice pack and her skin to prevent ice burn. She verbalized understanding.

## 2022-03-13 NOTE — Telephone Encounter (Signed)
Pt c/o swelling: STAT is pt has developed SOB within 24 hours  How much weight have you gained and in what time span? 1 1/2 lbs  If swelling, where is the swelling located? In knee and lower part of legs  Are you currently taking a fluid pill? Yes  Are you currently SOB? No  Do you have a log of your daily weights (if so, list)?   Have you gained 3 pounds in a day or 5 pounds in a week?   Have you traveled recently? No   Pt is wanting to know if she can increase her lasix.

## 2022-03-14 NOTE — Telephone Encounter (Signed)
Agree with plan 

## 2022-03-17 NOTE — Progress Notes (Signed)
Cardiology Office Note:    Date:  03/18/2022   ID:  Janice Small, DOB 08/17/1927, MRN [id number]  PCP:  [name], NP  Cardiologist:  Donato Heinz, MD  Electrophysiologist:  None   Referring MD: [name], NP   Chief Complaint  Patient presents with   Atrial Fibrillation    History of Present Illness:    Janice Small is a 88 y.o. female with a hx of chronic diastolic heart failure, hypertension, hypothyroidism who presents for follow-up.  She was referred by Dr. de [place] for evaluation of atrial fibrillation, initially seen on 01/09/2022.  Noted to be in A-fib at PCP clinic 12/23/2021.  Started on Xarelto 20 mg daily.  She reports that she has been having significant lower extremity, states that she has been putting bandages on her legs because of all the fluid weeping from her legs.  She also reports has been feeling short of breath.  Occurs with minimal exertion such as walking around her house.  Denies any chest pain.  Denies any lightheadedness or syncope.  No bleeding issues on Xarelto.  No smoking history.  Family history includes father had MI in 52s.  At initial clinic visit on 01/09/2022 was noted to be significantly volume overloaded.  She was sent to the ED and admitted for IV diuresis.  Echocardiogram 01/10/2022 showed EF 60 to 65%, normal RV function, moderate left atrial enlargement, mild to moderate mitral regurgitation.  She responded well to IV diuresis and was discharged on Lasix 40 mg daily along with Farxiga 10 mg daily.  Since last clinic visit, she reports she has been doing okay.  Continues to have lower extremity edema.  Weight is stable.  Denies any chest pain but reports some dyspnea.  Reports no bleeding since hemorrhoid banding.   Wt Readings from Last 3 Encounters:  03/18/22 142 lb 6.4 oz (64.6 kg)  02/25/22 142 lb (64.4 kg)  02/13/22 143 lb 12.8 oz (65.2 kg)     Past Medical History:  Diagnosis Date   BREAST BIOPSY, HX OF 11/27/2006    CHF (congestive heart failure) ([place])    COLONIC POLYPS, HX OF 11/27/2006   HEMORRHOIDS 01/17/2010   HYPERTENSION 11/27/2006   Hypertensive retinopathy    OU   HYPOTHYROIDISM 11/27/2006   Macular degeneration    Dry OU   MENOPAUSAL SYNDROME 11/27/2006   Vascular disease     Past Surgical History:  Procedure Laterality Date   ABDOMINAL HYSTERECTOMY     APPENDECTOMY     BREAST EXCISIONAL BIOPSY Right 1987   BREAST SURGERY     bx   CATARACT EXTRACTION Bilateral    EYE SURGERY Bilateral    Cat Sx OU   HEMORRHOID BANDING     TONSILLECTOMY      Current Medications: Current Meds  Medication Sig   Bromfenac Sodium (PROLENSA) 0.07 % SOLN Place 1 drop into the left eye 4 (four) times daily.   cloNIDine (CATAPRES) 0.1 MG tablet TAKE 1 TABLET BY MOUTH TWICE  DAILY   dapagliflozin propanediol (FARXIGA) 10 MG TABS tablet Take 1 tablet (10 mg total) by mouth daily.   MAGNESIUM PO Take 1 tablet by mouth daily.   metoprolol succinate (TOPROL-XL) 25 MG 24 hr tablet Take 1 tablet (25 mg total) by mouth daily.   prednisoLONE acetate (PRED FORTE) 1 % ophthalmic suspension Place 1 drop into the left eye 4 (four) times daily.   Rivaroxaban (XARELTO) 15 MG TABS tablet Take 1 tablet (15  mg total) by mouth daily with supper.   SYNTHROID 75 MCG tablet TAKE 1 TABLET BY MOUTH DAILY   VITAMIN D PO Take 1 tablet by mouth daily.   [DISCONTINUED] potassium chloride (KLOR-CON M) 10 MEQ tablet Take 1 tablet (10 mEq total) by mouth daily.     Allergies:   Codeine phosphate   Social History   Socioeconomic History   Marital status: Widowed    Spouse name: Not on file   Number of children: Not on file   Years of education: Not on file   Highest education level: Not on file  Occupational History   Not on file  Tobacco Use   Smoking status: Never   Smokeless tobacco: Never  Vaping Use   Vaping Use: Never used  Substance and Sexual Activity   Alcohol use: Never    Comment: rarely   Drug use: No    Sexual activity: Not Currently  Other Topics Concern   Not on file  Social History Narrative   The patient is retired she has been in [place] for more than 50 years she has 3 sons that she is widowed.   Never tobacco, no alcohol, 2 caffeinated beverages daily no drugs   Social Determinants of Health   Financial Resource Strain: Not on file  Food Insecurity: Not on file  Transportation Needs: Not on file  Physical Activity: Not on file  Stress: Not on file  Social Connections: Not on file     Family History: The patient's family history includes Heart disease in her father. There is no history of Colon cancer, Esophageal cancer, Pancreatic cancer, Liver cancer, or Stomach cancer.  ROS:   Please see the history of present illness.     All other systems reviewed and are negative.  EKGs/Labs/Other Studies Reviewed:    The following studies were reviewed today:   EKG:   01/09/22: Afib, rate 93, low voltage, Q wave in V1/2 01/23/22: Atrial fibrillation, rate 89, low voltage, nonspecific T wave flattening  Recent Labs: 12/23/2021: TSH 2.720 01/09/2022: ALT 43 01/23/2022: BNP 358.7; Hemoglobin 12.5; Platelets 148 02/13/2022: BUN 29; Creatinine, Ser 1.15; Magnesium 2.3; NT-Pro BNP 2,289; Potassium 4.4; Sodium 141  Recent Lipid Panel    Component Value Date/Time   CHOL 167 11/04/2021 1144   TRIG 58 11/04/2021 1144   HDL 86 11/04/2021 1144   CHOLHDL 1.9 11/04/2021 1144   CHOLHDL 2 12/03/2018 1010   VLDL 18.6 12/03/2018 1010   LDLCALC 69 11/04/2021 1144   LDLDIRECT 104.0 06/17/2013 1046    Physical Exam:    VS:  BP 132/88   Pulse 82   Ht 5' (1.524 m)   Wt 142 lb 6.4 oz (64.6 kg)   SpO2 99%   BMI 27.81 kg/m     Wt Readings from Last 3 Encounters:  03/18/22 142 lb 6.4 oz (64.6 kg)  02/25/22 142 lb (64.4 kg)  02/13/22 143 lb 12.8 oz (65.2 kg)     GEN:  Well nourished, well developed in no acute distress HEENT: Normal NECK: + JVD CARDIAC: Irregular, normal rate no  murmurs, rubs, gallops RESPIRATORY:  Clear to auscultation without rales, wheezing or rhonchi  ABDOMEN: Soft, non-tender, non-distended MUSCULOSKELETAL:  1+ edema SKIN: Warm and dry NEUROLOGIC:  Alert and oriented x 3 PSYCHIATRIC:  Normal affect   ASSESSMENT:    1. Chronic diastolic heart failure (HCC)   2. Persistent atrial fibrillation ([place])   3. Essential hypertension     PLAN:  Chronic diastolic heart failure: At initial clinic visit on 01/09/2022 was noted to be significantly volume overloaded.  She was sent to the ED and admitted for IV diuresis.  Echocardiogram 01/10/2022 showed EF 60 to 65%, normal RV function, moderate left atrial enlargement, mild to moderate mitral regurgitation.  She responded well to IV diuresis and was discharged on Lasix 40 mg daily along with Farxiga 10 mg daily. -Appears volume overloaded, will increase Lasix to 40 mg twice daily and continue Farxiga 10 mg daily.  Check BMET/magnesium today and at f/u in Afib clinic  Atrial fibrillation: New diagnosis 12/23/2021.  Could be contributing to heart failure as above -Continue Xarelto -Continue Toprol-XL 25 mg daily -A-fib could be contributing to diastolic heart failure as above and would consider cardioversion.  She was having bleeding from hemorrhoids so this was deferred, she recently underwent hemorrhoid banding and reports resolution.  She is undecided about cardioversion.  As she does appear more volume overloaded, recommend increased diuresis and follow-up in A-fib clinic next week.  If volume status improved and no further bleeding, would consider setting up for cardioversion if patient agreeable  Hypertension: On clonidine 0.1 mg twice daily, amlodipine 5 mg daily, Lasix 40 mg daily, Toprol-XL 25 mg daily.  Previously on lisinopril 40 mg daily, was discontinued during admission 12/2021 due to AKI.   -BP appears controlled.  Discussed switching from amlodipine to alternative antihypertensive as concerned  that amlodipine could be contributing to her lower extremity edema but she declines  Hypothyroidism: On levothyroxine 75 mcg daily.  TSH 2.72 on 12/23/2021   RTC in 6-8 weeks   Medication Adjustments/Labs and Tests Ordered: Current medicines are reviewed at length with the patient today.  Concerns regarding medicines are outlined above.  Orders Placed This Encounter  Procedures   Basic metabolic panel   Magnesium   Brain natriuretic peptide   Amb Referral to AFIB Clinic   EKG 12-Lead   Meds ordered this encounter  Medications   furosemide (LASIX) 40 MG tablet    Sig: Take 1 tablet (40 mg total) by mouth 2 (two) times daily.    Dispense:  180 tablet    Refill:  3   potassium chloride (KLOR-CON M) 10 MEQ tablet    Sig: Take 1 tablet (10 mEq total) by mouth 2 (two) times daily.    Dispense:  180 tablet    Refill:  3    Patient Instructions  Medication Instructions:  INCREASE furosemide (Lasix) to 40 mg two times daily INCREASE potassium to 10 meq two times daily  *If you need a refill on your cardiac medications before your next appointment, please call your pharmacy*   Lab Work: BMET, Mag, BNP today AND repeat at next appointment (with Afib clinic)  If you have labs (blood work) drawn today and your tests are completely normal, you will receive your results only by: [place] (if you have MyChart) OR A paper copy in the mail If you have any lab test that is abnormal or we need to change your treatment, we will call you to review the results.  Follow-Up: At [facility], you and your health needs are our priority.  As part of our continuing mission to provide you with exceptional heart care, we have created designated Provider Care Teams.  These Care Teams include your primary Cardiologist (physician) and Advanced Practice Providers (APPs -  Physician Assistants and Nurse Practitioners) who all work together to provide you with the care you need,  when you  need it.  We recommend signing up for the patient portal called "MyChart".  Sign up information is provided on this After Visit Summary.  MyChart is used to connect with patients for Virtual Visits (Telemedicine).  Patients are able to view lab/test results, encounter notes, upcoming appointments, etc.  Non-urgent messages can be sent to your provider as well.   To learn more about what you can do with MyChart, go to [email or web address].    Your next appointment:   6-8 weeks  The format for your next appointment:   In Person  Provider:   Donato Heinz, MD     Other Instructions You have been referred to: Atrial Fibrillation Clinic at [facility] (request appointment to be scheduled in 7-10 days)      '    Signed, Donato Heinz, MD  03/18/2022 10:52 AM    [place]

## 2022-03-18 ENCOUNTER — Other Ambulatory Visit: Payer: Self-pay | Admitting: Cardiology

## 2022-03-18 ENCOUNTER — Ambulatory Visit: Payer: Medicare Other | Attending: Cardiology | Admitting: Cardiology

## 2022-03-18 VITALS — BP 132/88 | HR 82 | Ht 60.0 in | Wt 142.4 lb

## 2022-03-18 DIAGNOSIS — I5032 Chronic diastolic (congestive) heart failure: Secondary | ICD-10-CM | POA: Diagnosis not present

## 2022-03-18 DIAGNOSIS — I10 Essential (primary) hypertension: Secondary | ICD-10-CM | POA: Diagnosis not present

## 2022-03-18 DIAGNOSIS — I4819 Other persistent atrial fibrillation: Secondary | ICD-10-CM | POA: Diagnosis not present

## 2022-03-18 LAB — SPECIMEN STATUS REPORT

## 2022-03-18 MED ORDER — POTASSIUM CHLORIDE CRYS ER 10 MEQ PO TBCR: 10.0000 meq | EXTENDED_RELEASE_TABLET | Freq: Two times a day (BID) | ORAL | 3 refills | Status: DC

## 2022-03-18 MED ORDER — FUROSEMIDE 40 MG PO TABS: 40.0000 mg | ORAL_TABLET | Freq: Two times a day (BID) | ORAL | 3 refills | Status: DC

## 2022-03-18 NOTE — Progress Notes (Signed)
[facility] Note  03/21/2022    CHIEF COMPLAINT Patient presents for Retina Follow Up  HISTORY OF PRESENT ILLNESS: Janice Small is a 88 y.o. female who presents to the clinic today for:  HPI     Retina Follow Up   Patient presents with  CRVO/BRVO.  In left eye.  This started months ago.  Duration of 4 weeks.  Since onset it is gradually worsening.  I, the attending physician,  performed the HPI with the patient and updated documentation appropriately.        Comments   Patient feels that the near vision is not as good as it used to be.       Last edited by Bernarda Caffey, MD on 03/21/2022  4:00 PM.    Pt states she is in a fib right now, but not experiencing any chest pain or heart palpitations at the moment, she has an appt next week to decide if she should have sx or not  Referring physician: [name], NP [address] [place],  [place] [address]  HISTORICAL INFORMATION:   Selected notes from the [place] Referred by Dr. Jerline Pain for eval of BRVO OS LEE: 10.12.2021 Ocular Hx- Glc suspect   CURRENT MEDICATIONS: Current Outpatient Medications (Ophthalmic Drugs)  Medication Sig   Bromfenac Sodium (PROLENSA) 0.07 % SOLN Place 1 drop into the left eye 4 (four) times daily.   prednisoLONE acetate (PRED FORTE) 1 % ophthalmic suspension Place 1 drop into the left eye 4 (four) times daily.   No current facility-administered medications for this visit. (Ophthalmic Drugs)   Current Outpatient Medications (Other)  Medication Sig   amLODipine (NORVASC) 5 MG tablet Take 1 tablet (5 mg total) by mouth daily.   cloNIDine (CATAPRES) 0.1 MG tablet TAKE 1 TABLET BY MOUTH TWICE  DAILY   dapagliflozin propanediol (FARXIGA) 10 MG TABS tablet Take 1 tablet (10 mg total) by mouth daily.   furosemide (LASIX) 40 MG tablet Take 1 tablet (40 mg total) by mouth 2 (two) times daily.   MAGNESIUM PO Take 1 tablet by mouth daily.    metoprolol succinate (TOPROL-XL) 25 MG 24 hr tablet Take 1 tablet (25 mg total) by mouth daily.   potassium chloride (KLOR-CON M) 10 MEQ tablet Take 1 tablet (10 mEq total) by mouth 2 (two) times daily.   Rivaroxaban (XARELTO) 15 MG TABS tablet Take 1 tablet (15 mg total) by mouth daily with supper.   SYNTHROID 75 MCG tablet TAKE 1 TABLET BY MOUTH DAILY   VITAMIN D PO Take 1 tablet by mouth daily.   No current facility-administered medications for this visit. (Other)   REVIEW OF SYSTEMS: ROS   Positive for: Gastrointestinal, Musculoskeletal, Eyes Negative for: Constitutional, Neurological, Skin, Genitourinary, HENT, Endocrine, Cardiovascular, Respiratory, Psychiatric, Allergic/Imm, Heme/Lymph Last edited by Annie Paras, COT on 03/21/2022  9:02 AM.      ALLERGIES Allergies  Allergen Reactions   Codeine Phosphate     REACTION: unspecified   PAST MEDICAL HISTORY Past Medical History:  Diagnosis Date   BREAST BIOPSY, HX OF 11/27/2006   CHF (congestive heart failure) ([place])    COLONIC POLYPS, HX OF 11/27/2006   HEMORRHOIDS 01/17/2010   HYPERTENSION 11/27/2006   Hypertensive retinopathy    OU   HYPOTHYROIDISM 11/27/2006   Macular degeneration    Dry OU   MENOPAUSAL SYNDROME 11/27/2006   Vascular disease    Past Surgical History:  Procedure Laterality Date  ABDOMINAL HYSTERECTOMY     APPENDECTOMY     BREAST EXCISIONAL BIOPSY Right 1987   BREAST SURGERY     bx   CATARACT EXTRACTION Bilateral    EYE SURGERY Bilateral    Cat Sx OU   HEMORRHOID BANDING     TONSILLECTOMY     FAMILY HISTORY Family History  Problem Relation Age of Onset   Heart disease Father    Colon cancer Neg Hx    Esophageal cancer Neg Hx    Pancreatic cancer Neg Hx    Liver cancer Neg Hx    Stomach cancer Neg Hx    SOCIAL HISTORY Social History   Tobacco Use   Smoking status: Never   Smokeless tobacco: Never  Vaping Use   Vaping Use: Never used  Substance Use Topics   Alcohol use:  Never    Comment: rarely   Drug use: No       OPHTHALMIC EXAM:  Base Eye Exam     Visual Acuity (Snellen - Linear)       Right Left   Dist cc 20/20 20/40   Dist ph cc  20/30    Correction: Glasses         Tonometry (Tonopen, 9:06 AM)       Right Left   Pressure 15 12         Pupils       Dark Light Shape React APD   Right 3 2 Round Minimal None   Left 3 2 Round Minimal None         Visual Fields       Left Right    Full Full         Extraocular Movement       Right Left    Full, Ortho Full, Ortho         Neuro/Psych     Oriented x3: Yes   Mood/Affect: Normal         Dilation     Both eyes: 1.0% Mydriacyl, 2.5% Phenylephrine @ 9:03 AM           Slit Lamp and Fundus Exam     Slit Lamp Exam       Right Left   Lids/Lashes Dermatochalasis - upper lid Dermatochalasis - upper lid, mild Meibomian gland dysfunction   Conjunctiva/Sclera White and quiet White and quiet   Cornea 2+ inferior Punctate epithelial erosions, well healed temporal cataract wounds, arcus, early band K nasal and temporal, tear film debris 3+ inferior Punctate epithelial erosions, irregular epi, well healed temporal cataract wounds, arcus, tear film debris   Anterior Chamber Deep and quiet Deep and quiet   Iris Round and dilated Round and dilated   Lens 3 piece PC IOL in good position with open PC 3 piece PC IOL in good position, 1+PCO (non-central)   Anterior Vitreous Vitreous syneresis Vitreous syneresis, Posterior vitreous detachment         Fundus Exam       Right Left   Disc Pink and Sharp, Compact mild Pallor, Sharp rim, temporal PPA, Compact   C/D Ratio 0.5 0.4   Macula Flat, Blunted foveal reflex, mild Drusen, RPE mottling and clumping, no heme or edema Blunted foveal reflex, focal IRH and exudates temporal macula -- slightly improved, cystic changes and edema -- slightly improved, +laser changes, Drusen   Vessels attenuated, Tortuous Tortuous, severe  attenuation of vessels superior macula   Periphery Attached, No heme  Attached, mild reticular degeneration  Refraction     Wearing Rx       Sphere Cylinder Axis Add   Right -1.00 +0.50 027 +2.50   Left -0.75 +0.75 083 +2.50    Type: progressive           IMAGING AND PROCEDURES  Imaging and Procedures for 03/21/2022  OCT, Retina - OU - Both Eyes       Right Eye Quality was good. Central Foveal Thickness: 265. Progression has been stable. Findings include normal foveal contour, no IRF, no SRF, retinal drusen .   Left Eye Quality was good. Central Foveal Thickness: 269. Progression has improved. Findings include normal foveal contour, no SRF, retinal drusen , intraretinal hyper-reflective material, intraretinal fluid (Mild interval improvement in RF/IRHM temporal macula and fovea ).   Notes *Images captured and stored on drive  Diagnosis / Impression:  OD: non-exu ARMD w/ fine drusen OS: BRVO w/ CME -- Mild interval improvement in RF/IRHM temporal macula and fovea   Clinical management:  See below  Abbreviations: NFP - Normal foveal profile. CME - cystoid macular edema. PED - pigment epithelial detachment. IRF - intraretinal fluid. SRF - subretinal fluid. EZ - ellipsoid zone. ERM - epiretinal membrane. ORA - outer retinal atrophy. ORT - outer retinal tubulation. SRHM - subretinal hyper-reflective material. IRHM - intraretinal hyper-reflective material      Intravitreal Injection, Pharmacologic Agent - OS - Left Eye       Time Out 03/21/2022. 10:28 AM. Confirmed correct patient, procedure, site, and patient consented.   Anesthesia Topical anesthesia was used. Anesthetic medications included Lidocaine 2%, Proparacaine 0.5%.   Procedure Preparation included 5% betadine to ocular surface, eyelid speculum. A (32g) needle was used.   Injection: 2 mg aflibercept 2 MG/0.05ML   Route: Intravitreal, Site: Left Eye   NDC: A3590391, Lot: [id number],  Expiration date: 05/14/2023, Waste: 0 mL   Post-op Post injection exam found visual acuity of at least counting fingers. The patient tolerated the procedure well. There were no complications. The patient received written and verbal post procedure care education. Post injection medications were not given.            ASSESSMENT/PLAN:    ICD-10-CM   1. Branch retinal vein occlusion of left eye with macular edema  H34.8320 OCT, Retina - OU - Both Eyes    Intravitreal Injection, Pharmacologic Agent - OS - Left Eye    aflibercept (EYLEA) SOLN 2 mg    2. Essential hypertension  I10     3. Hypertensive retinopathy of both eyes  H35.033     4. Early dry stage nonexudative age-related macular degeneration of both eyes  H35.3131     5. Pseudophakia, both eyes  Z96.1     6. Glaucoma suspect of both eyes  H40.003     7. Dry eyes  H04.123      1. BRVO w/ CME OS             - S/P IVA OS #1 (10.18.21), #2 (11.15.21), #3 (12.13.21), #4 (01.10.22), #5 (02.07.22), #6 (03.07.22), #7 (04.04.22), #8 (5.2.22) -- IVA resistance  - S/P IVE OS #1 (05.31.22) -- sample, #2 (06.28.22), #3 (07.26.22), #4 (08.23.22), #5 (09.20.22), #6 (10.20.22), #7 (11.17.22), #8 (12.15.22), #9 (01.12.23), #10 (02.09.23), #11 (03.16.23), #12 (04.13.23), #13 (05.11.23), #14 (06.08.23), #15 (07.10.23), #16 (08.11.23)             - S/P focal laser OS (02.16.22) - BCVA stable at 20/30 - OCT shows interval improvement in IRF/IRHM temporal  macula and fovea at 4 wks - reduced PF/Prolensa (for CME component) to BID OS due to steroid response - recommend IVE OS #17 today, 09.08.23 - pt wishes to proceed - RBA of procedure discussed, questions answered - informed consent obtained and signed - see procedure note - Avastin informed consent obtained and signed, 10.18.21 (OS) - Eylea informed consent obtained and signed, 05.31.22 - [facility] # [id number], 07/14/2021 - 07/13/2022  - cont PF/Prolensa BID OS - F/U 4 weeks, DFE,  OCT, possible injection   2,3. Hypertensive retinopathy OU - discussed importance of tight BP control - monitor  4. Age related macular degeneration, non-exudative, both eyes  - The incidence, anatomy, and pathology of dry AMD, risk of progression, and the AREDS and AREDS 2 study including smoking risks discussed with patient.   - recommend Amsler grid monitoring  5. Pseudophakia OU  - s/p CE/IOL  - IOL in good position, doing well  - monitor  6. Glaucoma Suspect  - IOP 16,20  - discussed the possibility of starting a glaucoma drop in the future    - under the expert care of Dr. Jerline Pain  7. Dry eyes OU (OS>OD) - recommend artificial tears and lubricating ointment as needed  Ophthalmic Meds Ordered this visit:  Meds ordered this encounter  Medications   aflibercept (EYLEA) SOLN 2 mg     Return in about 4 weeks (around 04/18/2022) for f/u BRVO OS, DFE, OCT.  There are no Patient Instructions on file for this visit.  This document serves as a record of services personally performed by Gardiner Sleeper, MD, PhD. It was created on their behalf by San Jetty. Owens Shark, OA an ophthalmic technician. The creation of this record is the provider's dictation and/or activities during the visit.    Electronically signed by: San Jetty. Secretary, [place] 09.05.2023 4:02 PM  Gardiner Sleeper, M.D., Ph.D. Diseases & Surgery of the Retina and Vitreous [facility]  I have reviewed the above documentation for accuracy and completeness, and I agree with the above. Gardiner Sleeper, M.D., Ph.D. 03/21/22 4:02 PM   Abbreviations: M myopia (nearsighted); A astigmatism; H hyperopia (farsighted); P presbyopia; Mrx spectacle prescription;  CTL contact lenses; OD right eye; OS left eye; OU both eyes  XT exotropia; ET esotropia; PEK punctate epithelial keratitis; PEE punctate epithelial erosions; DES dry eye syndrome; MGD meibomian gland dysfunction; ATs artificial tears; PFAT's preservative free  artificial tears; [place] nuclear sclerotic cataract; PSC posterior subcapsular cataract; ERM epi-retinal membrane; PVD posterior vitreous detachment; RD retinal detachment; DM diabetes mellitus; DR diabetic retinopathy; NPDR non-proliferative diabetic retinopathy; PDR proliferative diabetic retinopathy; CSME clinically significant macular edema; DME diabetic macular edema; dbh dot blot hemorrhages; CWS cotton wool spot; POAG primary open angle glaucoma; C/D cup-to-disc ratio; HVF humphrey visual field; GVF goldmann visual field; OCT optical coherence tomography; IOP intraocular pressure; BRVO Branch retinal vein occlusion; CRVO central retinal vein occlusion; CRAO central retinal artery occlusion; BRAO branch retinal artery occlusion; RT retinal tear; SB scleral buckle; PPV pars plana vitrectomy; VH Vitreous hemorrhage; PRP panretinal laser photocoagulation; IVK intravitreal kenalog; VMT vitreomacular traction; MH Macular hole;  NVD neovascularization of the disc; NVE neovascularization elsewhere; AREDS age related eye disease study; ARMD age related macular degeneration; POAG primary open angle glaucoma; EBMD epithelial/anterior basement membrane dystrophy; ACIOL anterior chamber intraocular lens; IOL intraocular lens; PCIOL posterior chamber intraocular lens; Phaco/IOL phacoemulsification with intraocular lens placement; PRK photorefractive keratectomy; LASIK laser assisted in situ keratomileusis; HTN  hypertension; DM diabetes mellitus; COPD chronic obstructive pulmonary disease

## 2022-03-18 NOTE — Patient Instructions (Addendum)
Medication Instructions:  INCREASE furosemide (Lasix) to 40 mg two times daily INCREASE potassium to 10 meq two times daily  *If you need a refill on your cardiac medications before your next appointment, please call your pharmacy*   Lab Work: BMET, Mag, BNP today AND repeat at next appointment (with Afib clinic)  If you have labs (blood work) drawn today and your tests are completely normal, you will receive your results only by: [place] (if you have MyChart) OR A paper copy in the mail If you have any lab test that is abnormal or we need to change your treatment, we will call you to review the results.  Follow-Up: At [facility], you and your health needs are our priority.  As part of our continuing mission to provide you with exceptional heart care, we have created designated Provider Care Teams.  These Care Teams include your primary Cardiologist (physician) and Advanced Practice Providers (APPs -  Physician Assistants and Nurse Practitioners) who all work together to provide you with the care you need, when you need it.  We recommend signing up for the patient portal called "MyChart".  Sign up information is provided on this After Visit Summary.  MyChart is used to connect with patients for Virtual Visits (Telemedicine).  Patients are able to view lab/test results, encounter notes, upcoming appointments, etc.  Non-urgent messages can be sent to your provider as well.   To learn more about what you can do with MyChart, go to [email or web address].    Your next appointment:   6-8 weeks  The format for your next appointment:   In Person  Provider:   Donato Heinz, MD     Other Instructions You have been referred to: Atrial Fibrillation Clinic at [facility] (request appointment to be scheduled in 7-10 days)      '

## 2022-03-19 LAB — BRAIN NATRIURETIC PEPTIDE: BNP: 409.5 pg/mL — ABNORMAL HIGH (ref 0.0–100.0)

## 2022-03-19 LAB — BASIC METABOLIC PANEL

## 2022-03-19 LAB — MAGNESIUM

## 2022-03-21 ENCOUNTER — Ambulatory Visit (INDEPENDENT_AMBULATORY_CARE_PROVIDER_SITE_OTHER): Payer: Medicare Other | Admitting: Ophthalmology

## 2022-03-21 ENCOUNTER — Encounter (INDEPENDENT_AMBULATORY_CARE_PROVIDER_SITE_OTHER): Payer: Self-pay | Admitting: Ophthalmology

## 2022-03-21 DIAGNOSIS — H35033 Hypertensive retinopathy, bilateral: Secondary | ICD-10-CM | POA: Diagnosis not present

## 2022-03-21 DIAGNOSIS — H04123 Dry eye syndrome of bilateral lacrimal glands: Secondary | ICD-10-CM

## 2022-03-21 DIAGNOSIS — H353131 Nonexudative age-related macular degeneration, bilateral, early dry stage: Secondary | ICD-10-CM

## 2022-03-21 DIAGNOSIS — I10 Essential (primary) hypertension: Secondary | ICD-10-CM

## 2022-03-21 DIAGNOSIS — H34832 Tributary (branch) retinal vein occlusion, left eye, with macular edema: Secondary | ICD-10-CM

## 2022-03-21 DIAGNOSIS — Z961 Presence of intraocular lens: Secondary | ICD-10-CM

## 2022-03-21 DIAGNOSIS — H40003 Preglaucoma, unspecified, bilateral: Secondary | ICD-10-CM

## 2022-03-21 MED ORDER — AFLIBERCEPT 2MG/0.05ML IZ SOLN FOR KALEIDOSCOPE
2.0000 mg | INTRAVITREAL | Status: AC | PRN
  Administered 2022-03-21: 2 mg via INTRAVITREAL

## 2022-03-25 ENCOUNTER — Ambulatory Visit (HOSPITAL_COMMUNITY)
Admission: RE | Admit: 2022-03-25 | Discharge: 2022-03-25 | Disposition: A | Discharge status: Home / Self Care | Payer: Medicare Other | Source: Ambulatory Visit | Attending: Nurse Practitioner | Admitting: Nurse Practitioner

## 2022-03-25 ENCOUNTER — Encounter (HOSPITAL_COMMUNITY): Payer: Self-pay | Admitting: Nurse Practitioner

## 2022-03-25 VITALS — BP 134/66 | HR 76 | Ht 60.0 in | Wt 141.4 lb

## 2022-03-25 DIAGNOSIS — I4819 Other persistent atrial fibrillation: Secondary | ICD-10-CM | POA: Diagnosis present

## 2022-03-25 DIAGNOSIS — D6869 Other thrombophilia: Secondary | ICD-10-CM | POA: Diagnosis not present

## 2022-03-25 DIAGNOSIS — Z7901 Long term (current) use of anticoagulants: Secondary | ICD-10-CM | POA: Diagnosis not present

## 2022-03-25 DIAGNOSIS — I10 Essential (primary) hypertension: Secondary | ICD-10-CM | POA: Diagnosis not present

## 2022-03-25 DIAGNOSIS — I5032 Chronic diastolic (congestive) heart failure: Secondary | ICD-10-CM | POA: Insufficient documentation

## 2022-03-25 LAB — BASIC METABOLIC PANEL
Anion gap: 7 (ref 5–15)
BUN: 30 mg/dL — ABNORMAL HIGH (ref 8–23)
CO2: 27 mmol/L (ref 22–32)
Calcium: 11.1 mg/dL — ABNORMAL HIGH (ref 8.9–10.3)
Chloride: 104 mmol/L (ref 98–111)
Creatinine, Ser: 1.27 mg/dL — ABNORMAL HIGH (ref 0.44–1.00)
GFR, Estimated: 40 mL/min — ABNORMAL LOW (ref >60)
Glucose, Bld: 104 mg/dL — ABNORMAL HIGH (ref 70–99)
Potassium: 4 mmol/L (ref 3.5–5.1)
Sodium: 138 mmol/L (ref 135–145)

## 2022-03-25 NOTE — Progress Notes (Signed)
Primary Care Physician: Janice Hutching Coralee Pesa, NP Referring Physician:   [name] is a 88 y.o. female with a h/o  hx of chronic diastolic heart failure, hypertension, hypothyroidism who presents for follow-up.  She was referred by Dr. de [place] for evaluation of atrial fibrillation, initially seen on 01/09/2022.  Noted to be in new onset A-fib at PCP clinic 12/23/2021.  Started on Xarelto daily.  She reports that she has been having significant lower extremity, states that she has been putting bandages on her legs because of all the fluid weeping from her legs.  She also reports has been feeling short of breath.  Occurs with minimal exertion such as walking around her house.   Admitted 6/29 with good diuresis. She saw Dr. Gardiner Rhyme 03/18/22 and the plan was to send here for f/u and if fluid status stable and no  bleeding form hemorrhoids to get pt scheduled for cardioversion. Discussion with pt and son this am re pursuing. Risk vrs benefit discussed. She  is  afraid she  missed one xarelto this last week so will schedule 3 weeks from today. She states that she had very little swelling in her legs prior to afib dx but although right now LEE is stable but is still present and makes her legs feel tight. No weeping currently but did have present prior to hospital and diuresis.    Today, she denies symptoms of palpitations, chest pain, shortness of breath, orthopnea, PND, + lower extremity edema, no dizziness, presyncope, syncope, or neurologic sequela. The patient is tolerating medications without difficulties and is otherwise without complaint today.   Past Medical History:  Diagnosis Date   BREAST BIOPSY, HX OF 11/27/2006   CHF (congestive heart failure) (HCC)    COLONIC POLYPS, HX OF 11/27/2006   HEMORRHOIDS 01/17/2010   HYPERTENSION 11/27/2006   Hypertensive retinopathy    OU   HYPOTHYROIDISM 11/27/2006   Macular degeneration    Dry OU   MENOPAUSAL SYNDROME 11/27/2006   Vascular disease     Past Surgical History:  Procedure Laterality Date   ABDOMINAL HYSTERECTOMY     APPENDECTOMY     BREAST EXCISIONAL BIOPSY Right 1987   BREAST SURGERY     bx   CATARACT EXTRACTION Bilateral    EYE SURGERY Bilateral    Cat Sx OU   HEMORRHOID BANDING     TONSILLECTOMY      Current Outpatient Medications  Medication Sig Dispense Refill   amLODipine (NORVASC) 5 MG tablet Take 1 tablet (5 mg total) by mouth daily. 30 tablet 0   cloNIDine (CATAPRES) 0.1 MG tablet TAKE 1 TABLET BY MOUTH TWICE  DAILY 180 tablet 3   dapagliflozin propanediol (FARXIGA) 10 MG TABS tablet Take 1 tablet (10 mg total) by mouth daily. 30 tablet 11   furosemide (LASIX) 40 MG tablet Take 1 tablet (40 mg total) by mouth 2 (two) times daily. (Patient taking differently: Take 40 mg by mouth daily.) 180 tablet 3   MAGNESIUM PO Take 1 tablet by mouth daily.     metoprolol succinate (TOPROL-XL) 25 MG 24 hr tablet Take 1 tablet (25 mg total) by mouth daily. 90 tablet 3   potassium chloride (KLOR-CON M) 10 MEQ tablet Take 1 tablet (10 mEq total) by mouth 2 (two) times daily. 180 tablet 3   Rivaroxaban (XARELTO) 15 MG TABS tablet Take 1 tablet (15 mg total) by mouth daily with supper. 30 tablet 5   SYNTHROID 75 MCG tablet TAKE 1 TABLET BY MOUTH  DAILY 90 tablet 3   VITAMIN D PO Take 1 tablet by mouth daily.     Bromfenac Sodium (PROLENSA) 0.07 % SOLN Place 1 drop into the left eye 4 (four) times daily. (Patient not taking: Reported on 03/25/2022) 3 mL 3   prednisoLONE acetate (PRED FORTE) 1 % ophthalmic suspension Place 1 drop into the left eye 4 (four) times daily. (Patient not taking: Reported on 03/25/2022) 15 mL 0   No current facility-administered medications for this encounter.    Allergies  Allergen Reactions   Codeine Phosphate     REACTION: unspecified    Social History   Socioeconomic History   Marital status: Widowed    Spouse name: Not on file   Number of children: Not on file   Years of education: Not  on file   Highest education level: Not on file  Occupational History   Not on file  Tobacco Use   Smoking status: Never   Smokeless tobacco: Never  Vaping Use   Vaping Use: Never used  Substance and Sexual Activity   Alcohol use: Never    Comment: rarely   Drug use: No   Sexual activity: Not Currently  Other Topics Concern   Not on file  Social History Narrative   The patient is retired she has been in [place] for more than 50 years she has 3 sons that she is widowed.   Never tobacco, no alcohol, 2 caffeinated beverages daily no drugs   Social Determinants of Health   Financial Resource Strain: Not on file  Food Insecurity: Not on file  Transportation Needs: Not on file  Physical Activity: Not on file  Stress: Not on file  Social Connections: Not on file  Intimate Partner Violence: Not on file    Family History  Problem Relation Age of Onset   Heart disease Father    Colon cancer Neg Hx    Esophageal cancer Neg Hx    Pancreatic cancer Neg Hx    Liver cancer Neg Hx    Stomach cancer Neg Hx     ROS- All systems are reviewed and negative except as per the HPI above  Physical Exam: Vitals:   03/25/22 0955  Weight: 64.1 kg  Height: 5' (1.524 m)   Wt Readings from Last 3 Encounters:  03/25/22 64.1 kg  03/18/22 64.6 kg  02/25/22 64.4 kg    Labs: Lab Results  Component Value Date   NA 139 03/18/2022   K 4.6 03/18/2022   CL 101 03/18/2022   CO2 22 03/18/2022   GLUCOSE 95 03/18/2022   GLUCOSE CANCELED 03/18/2022   BUN 31 03/18/2022   CREATININE 1.20 (H) 03/18/2022   CALCIUM 11.0 (H) 03/18/2022   MG 2.4 (H) 03/18/2022   MG CANCELED 03/18/2022   No results found for: "INR" Lab Results  Component Value Date   CHOL 167 11/04/2021   HDL 86 11/04/2021   LDLCALC 69 11/04/2021   TRIG 58 11/04/2021     GEN- The patient is well appearing, alert and oriented x 3 today.   Head- normocephalic, atraumatic Eyes-  Sclera clear, conjunctiva pink Ears-  hearing intact Oropharynx- clear Neck- supple, no JVP Lymph- no cervical lymphadenopathy Lungs- Clear to ausculation bilaterally, normal work of breathing Heart- irregular rate and rhythm, no murmurs, rubs or gallops, PMI not laterally displaced GI- soft, NT, ND, + BS Extremities- no clubbing, cyanosis, or  +edema MS- no significant deformity or atrophy Skin- no rash or lesion Psych- euthymic mood,  full affect Neuro- strength and sensation are intact  EKG-Vent. rate 76 BPM PR interval * ms QRS duration 72 ms QT/QTcB 322/362 ms P-R-T axes * 2 -35 Atrial fibrillation Low voltage QRS Nonspecific T wave abnormality Abnormal ECG When compared with ECG of 09-Jan-2022 14:01, PREVIOUS ECG IS PRESENT    Assessment and Plan:  1. Afib  Rate controlled  New onset in June and persistent since then Discussed  pursing cardioversion, risk vrs benefit, and pt and son want to proceed She is questioning if she missed one night of xarelto in this last week, so will schedule 3 weeks from today   2. LLE She has only  been taking lasix 40 mg daily vrs bid as she feels her weights at home stable, leg edema present but stable  Bmet today Avoid salt Elevate legs when sitting  If fluid status worsens prior to cardioversion contact the office   3. HTN Stable   4. CHA2DS2VASc  score of at least 5 Reminded not to miss xarelto 15 mg daily with pending cardioversion  Pt will come to Afib clinic am of cardioversion for bmet/mag   F/u with Dr. Gardiner Rhyme as scheduled in October   Maximos Zayas C. Desirey Keahey, [facility] [address] [place], [place] [address] [phone]

## 2022-03-25 NOTE — Patient Instructions (Signed)
Cardioversion scheduled for Wednesday, October 4th  -Come to afib clinic for labs at [place] at the [facility] and go to admitting at 10  - Do not eat or drink anything after midnight the night prior to your procedure.  - Take all your morning medication (except diabetic medications) with a sip of water prior to arrival.  - You will not be able to drive home after your procedure.  - Do NOT miss any doses of your blood thinner - if you should miss a dose please notify our office immediately.  - If you feel as if you go back into normal rhythm prior to scheduled cardioversion, please notify our office immediately. If your procedure is canceled in the cardioversion suite you will be charged a cancellation fee.

## 2022-03-25 NOTE — H&P (View-Only) (Signed)
Primary Care Physician: Donnetta Hutching Coralee Pesa, NP Referring Physician:   Waver Dibiasio is a 88 y.o. female with a h/o  hx of chronic diastolic heart failure, hypertension, hypothyroidism who presents for follow-up.  She was referred by Dr. de [place] for evaluation of atrial fibrillation, initially seen on 01/09/2022.  Noted to be in new onset A-fib at PCP clinic 12/23/2021.  Started on Xarelto daily.  She reports that she has been having significant lower extremity, states that she has been putting bandages on her legs because of all the fluid weeping from her legs.  She also reports has been feeling short of breath.  Occurs with minimal exertion such as walking around her house.   Admitted 6/29 with good diuresis. She saw Dr. Gardiner Rhyme 03/18/22 and the plan was to send here for f/u and if fluid status stable and no  bleeding form hemorrhoids to get pt scheduled for cardioversion. Discussion with pt and son this am re pursuing. Risk vrs benefit discussed. She  is  afraid she  missed one xarelto this last week so will schedule 3 weeks from today. She states that she had very little swelling in her legs prior to afib dx but although right now LEE is stable but is still present and makes her legs feel tight. No weeping currently but did have present prior to hospital and diuresis.    Today, she denies symptoms of palpitations, chest pain, shortness of breath, orthopnea, PND, + lower extremity edema, no dizziness, presyncope, syncope, or neurologic sequela. The patient is tolerating medications without difficulties and is otherwise without complaint today.   Past Medical History:  Diagnosis Date   BREAST BIOPSY, HX OF 11/27/2006   CHF (congestive heart failure) (HCC)    COLONIC POLYPS, HX OF 11/27/2006   HEMORRHOIDS 01/17/2010   HYPERTENSION 11/27/2006   Hypertensive retinopathy    OU   HYPOTHYROIDISM 11/27/2006   Macular degeneration    Dry OU   MENOPAUSAL SYNDROME 11/27/2006   Vascular disease     Past Surgical History:  Procedure Laterality Date   ABDOMINAL HYSTERECTOMY     APPENDECTOMY     BREAST EXCISIONAL BIOPSY Right 1987   BREAST SURGERY     bx   CATARACT EXTRACTION Bilateral    EYE SURGERY Bilateral    Cat Sx OU   HEMORRHOID BANDING     TONSILLECTOMY      Current Outpatient Medications  Medication Sig Dispense Refill   amLODipine (NORVASC) 5 MG tablet Take 1 tablet (5 mg total) by mouth daily. 30 tablet 0   cloNIDine (CATAPRES) 0.1 MG tablet TAKE 1 TABLET BY MOUTH TWICE  DAILY 180 tablet 3   dapagliflozin propanediol (FARXIGA) 10 MG TABS tablet Take 1 tablet (10 mg total) by mouth daily. 30 tablet 11   furosemide (LASIX) 40 MG tablet Take 1 tablet (40 mg total) by mouth 2 (two) times daily. (Patient taking differently: Take 40 mg by mouth daily.) 180 tablet 3   MAGNESIUM PO Take 1 tablet by mouth daily.     metoprolol succinate (TOPROL-XL) 25 MG 24 hr tablet Take 1 tablet (25 mg total) by mouth daily. 90 tablet 3   potassium chloride (KLOR-CON M) 10 MEQ tablet Take 1 tablet (10 mEq total) by mouth 2 (two) times daily. 180 tablet 3   Rivaroxaban (XARELTO) 15 MG TABS tablet Take 1 tablet (15 mg total) by mouth daily with supper. 30 tablet 5   SYNTHROID 75 MCG tablet TAKE 1 TABLET BY MOUTH  DAILY 90 tablet 3   VITAMIN D PO Take 1 tablet by mouth daily.     Bromfenac Sodium (PROLENSA) 0.07 % SOLN Place 1 drop into the left eye 4 (four) times daily. (Patient not taking: Reported on 03/25/2022) 3 mL 3   prednisoLONE acetate (PRED FORTE) 1 % ophthalmic suspension Place 1 drop into the left eye 4 (four) times daily. (Patient not taking: Reported on 03/25/2022) 15 mL 0   No current facility-administered medications for this encounter.    Allergies  Allergen Reactions   Codeine Phosphate     REACTION: unspecified    Social History   Socioeconomic History   Marital status: Widowed    Spouse name: Not on file   Number of children: Not on file   Years of education: Not  on file   Highest education level: Not on file  Occupational History   Not on file  Tobacco Use   Smoking status: Never   Smokeless tobacco: Never  Vaping Use   Vaping Use: Never used  Substance and Sexual Activity   Alcohol use: Never    Comment: rarely   Drug use: No   Sexual activity: Not Currently  Other Topics Concern   Not on file  Social History Narrative   The patient is retired she has been in [place] for more than 50 years she has 3 sons that she is widowed.   Never tobacco, no alcohol, 2 caffeinated beverages daily no drugs   Social Determinants of Health   Financial Resource Strain: Not on file  Food Insecurity: Not on file  Transportation Needs: Not on file  Physical Activity: Not on file  Stress: Not on file  Social Connections: Not on file  Intimate Partner Violence: Not on file    Family History  Problem Relation Age of Onset   Heart disease Father    Colon cancer Neg Hx    Esophageal cancer Neg Hx    Pancreatic cancer Neg Hx    Liver cancer Neg Hx    Stomach cancer Neg Hx     ROS- All systems are reviewed and negative except as per the HPI above  Physical Exam: Vitals:   03/25/22 0955  Weight: 64.1 kg  Height: 5' (1.524 m)   Wt Readings from Last 3 Encounters:  03/25/22 64.1 kg  03/18/22 64.6 kg  02/25/22 64.4 kg    Labs: Lab Results  Component Value Date   NA 139 03/18/2022   K 4.6 03/18/2022   CL 101 03/18/2022   CO2 22 03/18/2022   GLUCOSE 95 03/18/2022   GLUCOSE CANCELED 03/18/2022   BUN 31 03/18/2022   CREATININE 1.20 (H) 03/18/2022   CALCIUM 11.0 (H) 03/18/2022   MG 2.4 (H) 03/18/2022   MG CANCELED 03/18/2022   No results found for: "INR" Lab Results  Component Value Date   CHOL 167 11/04/2021   HDL 86 11/04/2021   LDLCALC 69 11/04/2021   TRIG 58 11/04/2021     GEN- The patient is well appearing, alert and oriented x 3 today.   Head- normocephalic, atraumatic Eyes-  Sclera clear, conjunctiva pink Ears-  hearing intact Oropharynx- clear Neck- supple, no JVP Lymph- no cervical lymphadenopathy Lungs- Clear to ausculation bilaterally, normal work of breathing Heart- irregular rate and rhythm, no murmurs, rubs or gallops, PMI not laterally displaced GI- soft, NT, ND, + BS Extremities- no clubbing, cyanosis, or  +edema MS- no significant deformity or atrophy Skin- no rash or lesion Psych- euthymic mood,  full affect Neuro- strength and sensation are intact  EKG-Vent. rate 76 BPM PR interval * ms QRS duration 72 ms QT/QTcB 322/362 ms P-R-T axes * 2 -35 Atrial fibrillation Low voltage QRS Nonspecific T wave abnormality Abnormal ECG When compared with ECG of 09-Jan-2022 14:01, PREVIOUS ECG IS PRESENT    Assessment and Plan:  1. Afib  Rate controlled  New onset in June and persistent since then Discussed  pursing cardioversion, risk vrs benefit, and pt and son want to proceed She is questioning if she missed one night of xarelto in this last week, so will schedule 3 weeks from today   2. LLE She has only  been taking lasix 40 mg daily vrs bid as she feels her weights at home stable, leg edema present but stable  Bmet today Avoid salt Elevate legs when sitting  If fluid status worsens prior to cardioversion contact the office   3. HTN Stable   4. CHA2DS2VASc  score of at least 5 Reminded not to miss xarelto 15 mg daily with pending cardioversion  Pt will come to Afib clinic am of cardioversion for bmet/mag   F/u with Dr. Gardiner Rhyme as scheduled in October   Nakema Fake C. Icyss Skog, [facility] [address] [place], [place] [address] [phone]

## 2022-04-01 ENCOUNTER — Encounter: Payer: Medicare Other | Admitting: Internal Medicine

## 2022-04-01 ENCOUNTER — Telehealth: Payer: Self-pay | Admitting: Cardiology

## 2022-04-01 NOTE — Telephone Encounter (Signed)
Spoke with patient of Dr. Gardiner Rhyme. She reports she had a large piece of fish with breading on it a few days ago and has felt full ever since. She reports her legs are tight. She did NOT increase lasix to '40mg'$  twice daily/K+ BID as directed at 9/5 visit. She did still have BMET on 9/12 at AFIB clinic. She reports weight gain of 1lb since yesterday. Advised OK to increase lasix to BID along with K, as advised per previous visit. Will send to MD to see if he would like her to remain on BID dosing of each, or have second dose as standing PRN and to inquire if she needs more labs, per patient request

## 2022-04-01 NOTE — Telephone Encounter (Signed)
Pt c/o medication issue:  1. Name of Medication:  furosemide (LASIX) 40 MG tablet  2. How are you currently taking this medication (dosage and times per day)? 1 tablet daily  3. Are you having a reaction (difficulty breathing--STAT)? no  4. What is your medication issue? Patient would like to know if it would be okay to take it twice a day for a couple of days.  She stated she was allowed to do that once before.

## 2022-04-01 NOTE — Telephone Encounter (Signed)
Agree with taking Lasix twice daily  x3 days.  Would then go back to daily dosing and monitor daily weights, can take twice daily if gains more than 3 pounds in 1 day or 5 pounds in 1 week

## 2022-04-03 NOTE — Telephone Encounter (Signed)
Attempt to call-unable to reach or leave VM.

## 2022-04-07 NOTE — Telephone Encounter (Signed)
Spoke to patient, she reports weight is 138-139, fluctuates based on what she eats. She states she never gains 3 lbs overnight or 5 lbs in 1 week.   Her legs continue to be swollen and tight.   She has not taken lasix BID every day as she forgets to take the evening dose some days and by the time she remembers it is too late.      Advised I would send message to Dr. Gardiner Rhyme to review.  Per chart review-labs are to be checked AM of cardioversion on 10/4 in afib clinic.

## 2022-04-08 NOTE — Telephone Encounter (Signed)
OK to take lasix BID, would check BMET/mag in 1 week once starts BID dosing

## 2022-04-10 LAB — BASIC METABOLIC PANEL
BUN/Creatinine Ratio: 26 (ref 12–28)
BUN: 31 mg/dL (ref 10–36)
CO2: 22 mmol/L (ref 20–29)
Calcium: 11 mg/dL — ABNORMAL HIGH (ref 8.7–10.3)
Chloride: 101 mmol/L (ref 96–106)
Creatinine, Ser: 1.2 mg/dL — ABNORMAL HIGH (ref 0.57–1.00)
Glucose: 95 mg/dL (ref 70–99)
Potassium: 4.6 mmol/L (ref 3.5–5.2)
Sodium: 139 mmol/L (ref 134–144)
eGFR: 42 mL/min/{1.73_m2} — ABNORMAL LOW (ref >59)

## 2022-04-10 LAB — MAGNESIUM: Magnesium: 2.4 mg/dL — ABNORMAL HIGH (ref 1.6–2.3)

## 2022-04-10 LAB — BRAIN NATRIURETIC PEPTIDE

## 2022-04-11 ENCOUNTER — Other Ambulatory Visit (HOSPITAL_BASED_OUTPATIENT_CLINIC_OR_DEPARTMENT_OTHER): Payer: Self-pay | Admitting: Family Medicine

## 2022-04-11 ENCOUNTER — Other Ambulatory Visit (INDEPENDENT_AMBULATORY_CARE_PROVIDER_SITE_OTHER): Payer: Self-pay | Admitting: Ophthalmology

## 2022-04-11 DIAGNOSIS — I4891 Unspecified atrial fibrillation: Secondary | ICD-10-CM

## 2022-04-14 ENCOUNTER — Telehealth: Payer: Self-pay | Admitting: Internal Medicine

## 2022-04-14 NOTE — Telephone Encounter (Signed)
Pt stated that's she is requesting an hemorrhoid banding as soon as possible: Pt stated that she has had several Hemorrhoid bandings in the past: Pt was scheduled to see Dr. Carlean Purl on 05/02/2022 at 2:50 pm: Pt was made aware: Pt verbalized understanding with all questions answered.

## 2022-04-14 NOTE — Telephone Encounter (Signed)
Inbound call from patient requesting to speak with a provider to inquire if she is able to come over tomorrow for Hemorid banding. Patient woke up this morning with bleeding and is requesting a call back from a provider. Please give a call to further advise.  Thank you

## 2022-04-15 NOTE — Progress Notes (Signed)
[facility] Note  04/24/2022    CHIEF COMPLAINT Patient presents for Retina Follow Up  HISTORY OF PRESENT ILLNESS: Janice Small is a 88 y.o. female who presents to the clinic today for:  HPI     Retina Follow Up   Patient presents with  CRVO/BRVO.  In left eye.  This started months ago.  Duration of 5 weeks.  Since onset it is gradually worsening.  I, the attending physician,  performed the HPI with the patient and updated documentation appropriately.        Comments   Patient states that she can not see as well as she used to, so there she is unable to tell if there has been any visual changes.       Last edited by Bernarda Caffey, MD on 04/24/2022 10:43 AM.    Pt slightly delayed to f/u -- almost 5 wks instead of 4. Pt states that she has not been using her drops for last few weeks due to other health issues -- dealing with A fib.   Referring physician: [name], NP [place],  [place] [id number]  HISTORICAL INFORMATION:   Selected notes from the MEDICAL RECORD NUMBER Referred by Dr. Jerline Pain for eval of BRVO OS LEE: 10.12.2021 Ocular Hx- Glc suspect   CURRENT MEDICATIONS: Current Outpatient Medications (Ophthalmic Drugs)  Medication Sig   Bromfenac Sodium (PROLENSA) 0.07 % SOLN Place 1 drop into the left eye 4 (four) times daily.   prednisoLONE acetate (PRED FORTE) 1 % ophthalmic suspension INSTILL 1 DROP INTO LEFT EYE 4 TIMES A DAY   No current facility-administered medications for this visit. (Ophthalmic Drugs)   Current Outpatient Medications (Other)  Medication Sig   amLODipine (NORVASC) 5 MG tablet Take 1 tablet (5 mg total) by mouth daily.   cloNIDine (CATAPRES) 0.1 MG tablet TAKE 1 TABLET BY MOUTH TWICE  DAILY   dapagliflozin propanediol (FARXIGA) 10 MG TABS tablet Take 1 tablet (10 mg total) by mouth daily.   furosemide (LASIX) 40 MG tablet Take 1 tablet (40 mg total) by mouth 2 (two) times daily.    metoprolol succinate (TOPROL-XL) 25 MG 24 hr tablet Take 1 tablet (25 mg total) by mouth daily.   potassium chloride (KLOR-CON M) 10 MEQ tablet Take 1 tablet (10 mEq total) by mouth 2 (two) times daily. (Patient taking differently: Take 10 mEq by mouth in the morning.)   Rivaroxaban (XARELTO) 15 MG TABS tablet Take 1 tablet (15 mg total) by mouth daily with supper.   SYNTHROID 75 MCG tablet TAKE 1 TABLET BY MOUTH DAILY   VITAMIN D PO Take 2,000 Units by mouth daily after lunch.   XARELTO 20 MG TABS tablet TAKE 1 TABLET BY MOUTH DAILY WITH SUPPER.   No current facility-administered medications for this visit. (Other)   REVIEW OF SYSTEMS: ROS   Positive for: Gastrointestinal, Musculoskeletal, Eyes Negative for: Constitutional, Neurological, Skin, Genitourinary, HENT, Endocrine, Cardiovascular, Respiratory, Psychiatric, Allergic/Imm, Heme/Lymph Last edited by Annie Paras, COT on 04/24/2022  8:58 AM.     ALLERGIES Allergies  Allergen Reactions   Codeine Phosphate Nausea And Vomiting   PAST MEDICAL HISTORY Past Medical History:  Diagnosis Date   BREAST BIOPSY, HX OF 11/27/2006   CHF (congestive heart failure) ([place])    COLONIC POLYPS, HX OF 11/27/2006   HEMORRHOIDS 01/17/2010   HYPERTENSION 11/27/2006   Hypertensive retinopathy    OU   HYPOTHYROIDISM 11/27/2006   Macular  degeneration    Dry OU   MENOPAUSAL SYNDROME 11/27/2006   Vascular disease    Past Surgical History:  Procedure Laterality Date   ABDOMINAL HYSTERECTOMY     APPENDECTOMY     BREAST EXCISIONAL BIOPSY Right 1987   BREAST SURGERY     bx   CARDIOVERSION N/A 04/16/2022   Procedure: CARDIOVERSION;  Surgeon: [name], MD;  Location: [facility];  Service: Cardiovascular;  Laterality: N/A;   CATARACT EXTRACTION Bilateral    EYE SURGERY Bilateral    Cat Sx OU   HEMORRHOID BANDING     TONSILLECTOMY     FAMILY HISTORY Family History  Problem Relation Age of Onset   Heart disease Father     Colon cancer Neg Hx    Esophageal cancer Neg Hx    Pancreatic cancer Neg Hx    Liver cancer Neg Hx    Stomach cancer Neg Hx    SOCIAL HISTORY Social History   Tobacco Use   Smoking status: Never   Smokeless tobacco: Never  Vaping Use   Vaping Use: Never used  Substance Use Topics   Alcohol use: Never    Comment: rarely   Drug use: No       OPHTHALMIC EXAM:  Base Eye Exam     Visual Acuity (Snellen - Linear)       Right Left   Dist cc 20/20 +2 20/30 +1   Dist ph cc  NI    Correction: Glasses         Tonometry (Tonopen, 9:03 AM)       Right Left   Pressure 18 20         Pupils       Dark Light Shape React APD   Right 3 2 Round Brisk None   Left 3 2 Round Brisk None         Visual Fields       Left Right    Full Full         Extraocular Movement       Right Left    Full, Ortho Full, Ortho         Neuro/Psych     Oriented x3: Yes   Mood/Affect: Normal         Dilation     Both eyes: 1.0% Mydriacyl, 2.5% Phenylephrine @ 8:58 AM           Slit Lamp and Fundus Exam     Slit Lamp Exam       Right Left   Lids/Lashes Dermatochalasis - upper lid Dermatochalasis - upper lid, mild Meibomian gland dysfunction   Conjunctiva/Sclera White and quiet White and quiet   Cornea 2+ inferior Punctate epithelial erosions, well healed temporal cataract wounds, arcus, early band K nasal and temporal, tear film debris 3+ inferior Punctate epithelial erosions, irregular epi, well healed temporal cataract wounds, arcus, tear film debris   Anterior Chamber Deep and quiet Deep and quiet   Iris Round and dilated Round and dilated   Lens 3 piece PC IOL in good position with open PC 3 piece PC IOL in good position, 1+PCO (non-central)   Anterior Vitreous Vitreous syneresis Vitreous syneresis, Posterior vitreous detachment         Fundus Exam       Right Left   Disc Pink and Sharp, Compact mild Pallor, Sharp rim, temporal PPA, Compact   C/D Ratio  0.5 0.4   Macula Flat, Blunted foveal reflex, mild Drusen,  RPE mottling and clumping, no heme or edema Blunted foveal reflex, focal IRH and exudates temporal macula -- slightly improved, cystic changes and edema -- slightly increased, +laser changes, Drusen   Vessels attenuated, mild tortuosity attenuated, Tortuous   Periphery Attached, No heme  Attached, mild reticular degeneration           Refraction     Wearing Rx       Sphere Cylinder Axis Add   Right -1.00 +0.50 027 +2.50   Left -0.75 +0.75 083 +2.50    Type: progressive           IMAGING AND PROCEDURES  Imaging and Procedures for 04/24/2022  OCT, Retina - OU - Both Eyes       Right Eye Quality was good. Central Foveal Thickness: 267. Progression has been stable. Findings include normal foveal contour, no IRF, no SRF, retinal drusen .   Left Eye Quality was good. Central Foveal Thickness: 266. Progression has worsened. Findings include normal foveal contour, no SRF, retinal drusen , intraretinal hyper-reflective material, intraretinal fluid (Persistent IRF/IRHM temporal macula and fovea -- slightly increased).   Notes *Images captured and stored on drive  Diagnosis / Impression:  OD: non-exu ARMD w/ fine drusen OS: BRVO w/ CME -- Persistent IRF/IRHM temporal macula and fovea -- slightly increased  Clinical management:  See below  Abbreviations: NFP - Normal foveal profile. CME - cystoid macular edema. PED - pigment epithelial detachment. IRF - intraretinal fluid. SRF - subretinal fluid. EZ - ellipsoid zone. ERM - epiretinal membrane. ORA - outer retinal atrophy. ORT - outer retinal tubulation. SRHM - subretinal hyper-reflective material. IRHM - intraretinal hyper-reflective material      Intravitreal Injection, Pharmacologic Agent - OS - Left Eye       Time Out 04/24/2022. 9:50 AM. Confirmed correct patient, procedure, site, and patient consented.   Anesthesia Topical anesthesia was used. Anesthetic  medications included Lidocaine 2%, Proparacaine 0.5%.   Procedure Preparation included 5% betadine to ocular surface, eyelid speculum. A (32g) needle was used.   Injection: 2 mg aflibercept 2 MG/0.05ML   Route: Intravitreal, Site: Left Eye   NDC: [id number], Lot: [id number], Expiration date: 05/14/2023, Waste: 0 mL   Post-op Post injection exam found visual acuity of at least counting fingers. The patient tolerated the procedure well. There were no complications. The patient received written and verbal post procedure care education. Post injection medications were not given.            ASSESSMENT/PLAN:    ICD-10-CM   1. Branch retinal vein occlusion of left eye with macular edema  H34.8320 OCT, Retina - OU - Both Eyes    Intravitreal Injection, Pharmacologic Agent - OS - Left Eye    aflibercept (EYLEA) SOLN 2 mg    2. Essential hypertension  I10     3. Hypertensive retinopathy of both eyes  H35.033     4. Early dry stage nonexudative age-related macular degeneration of both eyes  H35.3131     5. Pseudophakia, both eyes  Z96.1     6. Glaucoma suspect of both eyes  H40.003     7. Dry eyes  H04.123      1. BRVO w/ CME OS  - slight delay in f/u -- almost 5 wks instead of 4 due to Thursday preference             - S/P IVA OS #1 (10.18.21), #2 (11.15.21), #3 (12.13.21), #4 (01.10.22), #5 (02.07.22), #6 (03.07.22), #7 (04.04.22), #8 (  5.2.22) -- IVA resistance  - S/P IVE OS #1 (05.31.22) -- sample, #2 (06.28.22), #3 (07.26.22), #4 (08.23.22), #5 (09.20.22), #6 (10.20.22), #7 (11.17.22), #8 (12.15.22), #9 (01.12.23), #10 (02.09.23), #11 (03.16.23), #12 (04.13.23), #13 (05.11.23), #14 (06.08.23), #15 (07.10.23), #16 (08.11.23),#17 (09.08.23)             - S/P focal laser OS (02.16.22) - BCVA stable at 20/30 - OCT shows persistent IRF/IRHM temporal macula and fovea -- slightly increased at 5 weeks - reduced PF/Prolensa (for CME component) to BID OS due to steroid response -- pt  states she has not been taking due to dealing with other health issues (A fib) - recommend IVE OS #18 today, 10.12.23 with follow up back to 4 weeks - pt wishes to proceed - RBA of procedure discussed, questions answered - informed consent obtained and signed - see procedure note - Avastin informed consent obtained and signed, 10.18.21 (OS) - Eylea informed consent obtained and signed, 05.31.22 - [facility] # [id number], 07/14/2021 - 07/13/2022  - cont PF/Prolensa BID OS - F/U 4 weeks, DFE, OCT, possible injection   2,3. Hypertensive retinopathy OU - discussed importance of tight BP control - monitor  4. Age related macular degeneration, non-exudative, both eyes  - The incidence, anatomy, and pathology of dry AMD, risk of progression, and the AREDS and AREDS 2 study including smoking risks discussed with patient.   - recommend Amsler grid monitoring  5. Pseudophakia OU  - s/p CE/IOL  - IOL in good position, doing well  - monitor  6. Glaucoma Suspect  - IOP 16,20  - discussed the possibility of starting a glaucoma drop in the future    - under the expert care of Dr. Jerline Pain  7. Dry eyes OU (OS>OD) - recommend artificial tears and lubricating ointment as needed  Ophthalmic Meds Ordered this visit:  Meds ordered this encounter  Medications   aflibercept (EYLEA) SOLN 2 mg     Return in about 4 weeks (around 05/22/2022) for f/u BRVO OS, DFE, OCT, Possible Injxn.  There are no Patient Instructions on file for this visit.  This document serves as a record of services personally performed by Gardiner Sleeper, MD, PhD. It was created on their behalf by San Jetty. Owens Shark, OA an ophthalmic technician. The creation of this record is the provider's dictation and/or activities during the visit.    Electronically signed by: San Jetty. Owens Shark, [place] 10.03.2023 10:44 AM   Gardiner Sleeper, M.D., Ph.D. Diseases & Surgery of the Retina and Vitreous [facility]  I have  reviewed the above documentation for accuracy and completeness, and I agree with the above. Gardiner Sleeper, M.D., Ph.D. 04/24/22 10:44 AM  Abbreviations: M myopia (nearsighted); A astigmatism; H hyperopia (farsighted); P presbyopia; Mrx spectacle prescription;  CTL contact lenses; OD right eye; OS left eye; OU both eyes  XT exotropia; ET esotropia; PEK punctate epithelial keratitis; PEE punctate epithelial erosions; DES dry eye syndrome; MGD meibomian gland dysfunction; ATs artificial tears; PFAT's preservative free artificial tears; [place] nuclear sclerotic cataract; PSC posterior subcapsular cataract; ERM epi-retinal membrane; PVD posterior vitreous detachment; RD retinal detachment; DM diabetes mellitus; DR diabetic retinopathy; NPDR non-proliferative diabetic retinopathy; PDR proliferative diabetic retinopathy; CSME clinically significant macular edema; DME diabetic macular edema; dbh dot blot hemorrhages; CWS cotton wool spot; POAG primary open angle glaucoma; C/D cup-to-disc ratio; HVF humphrey visual field; GVF goldmann visual field; OCT optical coherence tomography; IOP intraocular pressure; BRVO Branch retinal  vein occlusion; CRVO central retinal vein occlusion; CRAO central retinal artery occlusion; BRAO branch retinal artery occlusion; RT retinal tear; SB scleral buckle; PPV pars plana vitrectomy; VH Vitreous hemorrhage; PRP panretinal laser photocoagulation; IVK intravitreal kenalog; VMT vitreomacular traction; MH Macular hole;  NVD neovascularization of the disc; NVE neovascularization elsewhere; AREDS age related eye disease study; ARMD age related macular degeneration; POAG primary open angle glaucoma; EBMD epithelial/anterior basement membrane dystrophy; ACIOL anterior chamber intraocular lens; IOL intraocular lens; PCIOL posterior chamber intraocular lens; Phaco/IOL phacoemulsification with intraocular lens placement; [place] photorefractive keratectomy; LASIK laser assisted in situ keratomileusis; HTN  hypertension; DM diabetes mellitus; COPD chronic obstructive pulmonary disease

## 2022-04-16 ENCOUNTER — Ambulatory Visit (HOSPITAL_COMMUNITY): Payer: Medicare Other | Admitting: Certified Registered"

## 2022-04-16 ENCOUNTER — Encounter (HOSPITAL_COMMUNITY)
Admission: RE | Disposition: A | Discharge status: Home / Self Care | Payer: Self-pay | Source: Home / Self Care | Attending: Internal Medicine

## 2022-04-16 ENCOUNTER — Ambulatory Visit (HOSPITAL_COMMUNITY)
Admission: RE | Admit: 2022-04-16 | Discharge: 2022-04-16 | Disposition: A | Discharge status: Home / Self Care | Payer: Medicare Other | Source: Ambulatory Visit | Attending: Nurse Practitioner | Admitting: Nurse Practitioner

## 2022-04-16 ENCOUNTER — Ambulatory Visit (HOSPITAL_BASED_OUTPATIENT_CLINIC_OR_DEPARTMENT_OTHER): Payer: Medicare Other | Admitting: Certified Registered"

## 2022-04-16 ENCOUNTER — Other Ambulatory Visit: Payer: Self-pay

## 2022-04-16 ENCOUNTER — Ambulatory Visit (HOSPITAL_COMMUNITY)
Admission: RE | Admit: 2022-04-16 | Discharge: 2022-04-16 | Disposition: A | Discharge status: Home / Self Care | Payer: Medicare Other | Attending: Internal Medicine | Admitting: Internal Medicine

## 2022-04-16 ENCOUNTER — Encounter (HOSPITAL_COMMUNITY): Payer: Self-pay | Admitting: Internal Medicine

## 2022-04-16 DIAGNOSIS — Z79899 Other long term (current) drug therapy: Secondary | ICD-10-CM | POA: Insufficient documentation

## 2022-04-16 DIAGNOSIS — N289 Disorder of kidney and ureter, unspecified: Secondary | ICD-10-CM | POA: Diagnosis not present

## 2022-04-16 DIAGNOSIS — I4891 Unspecified atrial fibrillation: Secondary | ICD-10-CM | POA: Diagnosis present

## 2022-04-16 DIAGNOSIS — I4819 Other persistent atrial fibrillation: Secondary | ICD-10-CM

## 2022-04-16 DIAGNOSIS — I110 Hypertensive heart disease with heart failure: Secondary | ICD-10-CM

## 2022-04-16 DIAGNOSIS — I509 Heart failure, unspecified: Secondary | ICD-10-CM

## 2022-04-16 DIAGNOSIS — E039 Hypothyroidism, unspecified: Secondary | ICD-10-CM | POA: Insufficient documentation

## 2022-04-16 HISTORY — PX: CARDIOVERSION: SHX1299

## 2022-04-16 LAB — POCT I-STAT, CHEM 8
BUN: 42 mg/dL — ABNORMAL HIGH (ref 8–23)
Calcium, Ion: 1.35 mmol/L (ref 1.15–1.40)
Chloride: 104 mmol/L (ref 98–111)
Creatinine, Ser: 1.3 mg/dL — ABNORMAL HIGH (ref 0.44–1.00)
Glucose, Bld: 104 mg/dL — ABNORMAL HIGH (ref 70–99)
HCT: 40 % (ref 36.0–46.0)
Hemoglobin: 13.6 g/dL (ref 12.0–15.0)
Potassium: 4 mmol/L (ref 3.5–5.1)
Sodium: 140 mmol/L (ref 135–145)
TCO2: 28 mmol/L (ref 22–32)

## 2022-04-16 LAB — CBC
HCT: 37.5 % (ref 36.0–46.0)
Hemoglobin: 12.2 g/dL (ref 12.0–15.0)
MCH: 33.3 pg (ref 26.0–34.0)
MCHC: 32.5 g/dL (ref 30.0–36.0)
MCV: 102.5 fL — ABNORMAL HIGH (ref 80.0–100.0)
Platelets: 138 10*3/uL — ABNORMAL LOW (ref 150–400)
RBC: 3.66 MIL/uL — ABNORMAL LOW (ref 3.87–5.11)
RDW: 14.9 % (ref 11.5–15.5)
WBC: 3.9 10*3/uL — ABNORMAL LOW (ref 4.0–10.5)
nRBC: 0 % (ref 0.0–0.2)

## 2022-04-16 LAB — BASIC METABOLIC PANEL
Anion gap: 9 (ref 5–15)
BUN: 36 mg/dL — ABNORMAL HIGH (ref 8–23)
CO2: 26 mmol/L (ref 22–32)
Calcium: 10.8 mg/dL — ABNORMAL HIGH (ref 8.9–10.3)
Chloride: 106 mmol/L (ref 98–111)
Creatinine, Ser: 1.26 mg/dL — ABNORMAL HIGH (ref 0.44–1.00)
GFR, Estimated: 40 mL/min — ABNORMAL LOW (ref >60)
Glucose, Bld: 109 mg/dL — ABNORMAL HIGH (ref 70–99)
Potassium: 4.2 mmol/L (ref 3.5–5.1)
Sodium: 141 mmol/L (ref 135–145)

## 2022-04-16 LAB — MAGNESIUM: Magnesium: 2.7 mg/dL — ABNORMAL HIGH (ref 1.7–2.4)

## 2022-04-16 SURGERY — CARDIOVERSION
Anesthesia: General

## 2022-04-16 MED ORDER — LIDOCAINE 2% (20 MG/ML) 5 ML SYRINGE
INTRAMUSCULAR | Status: DC | PRN
  Administered 2022-04-16: 20 mg via INTRAVENOUS

## 2022-04-16 MED ORDER — PROPOFOL 10 MG/ML IV BOLUS
INTRAVENOUS | Status: DC | PRN
  Administered 2022-04-16: 40 mg via INTRAVENOUS
  Administered 2022-04-16: 20 mg via INTRAVENOUS

## 2022-04-16 MED ORDER — SODIUM CHLORIDE 0.9 % IV SOLN: INTRAVENOUS | Status: DC

## 2022-04-16 NOTE — CV Procedure (Signed)
Procedure: Electrical Cardioversion Indications:  Atrial Fibrillation  Procedure Details:  Consent: Risks of procedure as well as the alternatives and risks of each were explained to the (patient/caregiver).  Consent for procedure obtained.  Time Out: Verified patient identification, verified procedure, site/side was marked, verified correct patient position, special equipment/implants available, medications/allergies/relevent history reviewed, required imaging and test results available. PERFORMED.  Patient placed on cardiac monitor, pulse oximetry, supplemental oxygen as necessary.  Sedation given:  propofol per anesthesia Pacer pads placed anterior and posterior chest.  Cardioverted 2 time(s).  Cardioversion with synchronized biphasic 120J, 200J shock.  Evaluation: Findings: Post procedure EKG shows: NSR Complications: None Patient did tolerate procedure well.  Time Spent Directly with the Patient:  30 minutes   Janice Small 04/16/2022, 10:24 AM

## 2022-04-16 NOTE — Interval H&P Note (Signed)
History and Physical Interval Note:  04/16/2022 10:06 AM  Janice Small  has presented today for surgery, with the diagnosis of AFIB.  The various methods of treatment have been discussed with the patient and family. After consideration of risks, benefits and other options for treatment, the patient has consented to  Procedure(s): CARDIOVERSION (N/A) as a surgical intervention.  The patient's history has been reviewed, patient examined, no change in status, stable for surgery.  I have reviewed the patient's chart and labs.  Questions were answered to the patient's satisfaction.     [name]

## 2022-04-16 NOTE — Anesthesia Procedure Notes (Signed)
Procedure Name: General with mask airway Date/Time: 04/16/2022 10:18 AM  Performed by: Anastasio Auerbach, CRNAPre-anesthesia Checklist: Patient identified, Emergency Drugs available, Suction available and Patient being monitored Patient Re-evaluated:Patient Re-evaluated prior to induction Oxygen Delivery Method: Ambu bag Induction Type: IV induction Ventilation: Mask ventilation without difficulty

## 2022-04-16 NOTE — Anesthesia Preprocedure Evaluation (Signed)
Anesthesia Evaluation  Patient identified by MRN, date of birth, ID band Patient awake    Reviewed: Allergy & Precautions, NPO status , Patient's Chart, lab work & pertinent test results  Airway Mallampati: II  TM Distance: >3 FB Neck ROM: Full    Dental   Pulmonary neg pulmonary ROS,    breath sounds clear to auscultation       Cardiovascular hypertension, Pt. on medications and Pt. on home beta blockers +CHF  + dysrhythmias  Rhythm:Irregular Rate:Normal  Normal EF. Mild/ Mod MR.   Neuro/Psych negative neurological ROS     GI/Hepatic negative GI ROS, Neg liver ROS,   Endo/Other  Hypothyroidism   Renal/GU Renal InsufficiencyRenal disease     Musculoskeletal   Abdominal   Peds  Hematology negative hematology ROS (+)   Anesthesia Other Findings   Reproductive/Obstetrics                             Anesthesia Physical Anesthesia Plan  ASA: 3  Anesthesia Plan: General   Post-op Pain Management:    Induction: Intravenous  PONV Risk Score and Plan: 3 and Treatment may vary due to age or medical condition  Airway Management Planned: Natural Airway and Mask  Additional Equipment:   Intra-op Plan:   Post-operative Plan:   Informed Consent: I have reviewed the patients History and Physical, chart, labs and discussed the procedure including the risks, benefits and alternatives for the proposed anesthesia with the patient or authorized representative who has indicated his/her understanding and acceptance.       Plan Discussed with:   Anesthesia Plan Comments:         Anesthesia Quick Evaluation

## 2022-04-16 NOTE — Transfer of Care (Signed)
Immediate Anesthesia Transfer of Care Note  Patient: Janice Small  Procedure(s) Performed: CARDIOVERSION  Patient Location: Endoscopy Unit  Anesthesia Type:General  Level of Consciousness: awake, alert  and oriented  Airway & Oxygen Therapy: Patient Spontanous Breathing  Post-op Assessment: Report given to RN  Post vital signs: Reviewed and stable  Last Vitals:  Vitals Value Taken Time  BP 94/58   Temp    Pulse 59 04/16/22 1025  Resp 11 04/16/22 1025  SpO2 100 % 04/16/22 1025  Vitals shown include unvalidated device data.  Last Pain:  Vitals:   04/16/22 1001  TempSrc: Temporal  PainSc: 0-No pain         Complications: No notable events documented.

## 2022-04-16 NOTE — Discharge Instructions (Signed)

## 2022-04-17 ENCOUNTER — Telehealth: Payer: Self-pay | Admitting: Cardiology

## 2022-04-17 MED ORDER — FUROSEMIDE 40 MG PO TABS: 40.0000 mg | ORAL_TABLET | Freq: Two times a day (BID) | ORAL | 3 refills | Status: DC

## 2022-04-17 NOTE — Telephone Encounter (Signed)
*  STAT* If patient is at the pharmacy, call can be transferred to refill team.   1. Which medications need to be refilled? (please list name of each medication and dose if known)   furosemide (LASIX) 40 MG tablet  2. Which pharmacy/location (including street and city if local pharmacy) is medication to be sent to?    [id number]- [place], [place] - [address]  3. Do they need a 30 day or 90 day supply? 90 day   Patient wanted to know if she still needs to take this medication as she will need a refill.

## 2022-04-17 NOTE — Anesthesia Postprocedure Evaluation (Signed)
Anesthesia Post Note  Patient: Janice Small  Procedure(s) Performed: CARDIOVERSION     Patient location during evaluation: PACU Anesthesia Type: General Level of consciousness: awake and alert Pain management: pain level controlled Vital Signs Assessment: post-procedure vital signs reviewed and stable Respiratory status: spontaneous breathing, nonlabored ventilation, respiratory function stable and patient connected to nasal cannula oxygen Cardiovascular status: blood pressure returned to baseline and stable Postop Assessment: no apparent nausea or vomiting Anesthetic complications: no   No notable events documented.  Last Vitals:  Vitals:   04/16/22 1041 04/16/22 1051  BP: 115/61 138/69  Pulse: (!) 50 (!) 52  Resp: 15 (!) 21  Temp:    SpO2: 94% 95%    Last Pain:  Vitals:   04/16/22 1051  TempSrc:   PainSc: 0-No pain                 Tiajuana Amass

## 2022-04-18 ENCOUNTER — Telehealth: Payer: Self-pay | Admitting: Cardiology

## 2022-04-18 MED ORDER — FUROSEMIDE 40 MG PO TABS: 40.0000 mg | ORAL_TABLET | Freq: Two times a day (BID) | ORAL | 3 refills | Status: DC

## 2022-04-18 NOTE — Telephone Encounter (Signed)
*  STAT* If patient is at the pharmacy, call can be transferred to refill team.   1. Which medications need to be refilled? (please list name of each medication and dose if known) furosemide (LASIX) 40 MG tablet  2. Which pharmacy/location (including street and city if local pharmacy) is medication to be sent to? [id number]- [place], [place] - [address]  3. Do they need a 30 day or 90 day supply? [address]

## 2022-04-18 NOTE — Telephone Encounter (Signed)
Medication has been sent to the pharmacy for refill

## 2022-04-20 ENCOUNTER — Encounter (HOSPITAL_COMMUNITY): Payer: Self-pay | Admitting: Internal Medicine

## 2022-04-21 ENCOUNTER — Other Ambulatory Visit: Payer: Self-pay

## 2022-04-21 MED ORDER — FUROSEMIDE 40 MG PO TABS: 40.0000 mg | ORAL_TABLET | Freq: Two times a day (BID) | ORAL | 3 refills | Status: DC

## 2022-04-22 ENCOUNTER — Other Ambulatory Visit (HOSPITAL_COMMUNITY): Payer: Self-pay | Admitting: *Deleted

## 2022-04-22 NOTE — Progress Notes (Signed)
Mag d/c'ed due to elevated level 04/16/22

## 2022-04-23 ENCOUNTER — Telehealth: Payer: Self-pay | Admitting: Cardiology

## 2022-04-23 NOTE — Telephone Encounter (Signed)
Pt c/o medication issue:  1. Name of Medication: Furosemide  2. How are you currently taking this medication (dosage and times per day)?   3. Are you having a reaction (difficulty breathing--STAT)?   4. What is your medication issue? Need the directions on how she is supposed to be taking the Furosemide pleasei

## 2022-04-23 NOTE — Telephone Encounter (Signed)
Has she been taking it twice daily?  She was supposed to have labs checked 1 week after starting twice daily dosing.  It is okay to take twice daily but would need to monitor labs.  If she has not started taking twice daily, and she feels like the fluid has improved, can continue on just once daily dosing.  If she started taking it twice daily, will need to check blood work.

## 2022-04-23 NOTE — Telephone Encounter (Signed)
Pt called inquiring as to how's she's supposed to take lasix. She stated she was under the assumption she was only to take 40 mg BID for a short period of time.  Per office visit on 9/5, MD recommended increasing lasix to 40 mg BID but on 9/12 Roderic Palau noted pt was only taking 40 mg daily with stable weights. Pt report current weight is still stable.  Will forward to MD for clarification.

## 2022-04-24 ENCOUNTER — Encounter (INDEPENDENT_AMBULATORY_CARE_PROVIDER_SITE_OTHER): Payer: Self-pay | Admitting: Ophthalmology

## 2022-04-24 ENCOUNTER — Ambulatory Visit (INDEPENDENT_AMBULATORY_CARE_PROVIDER_SITE_OTHER): Payer: Medicare Other | Admitting: Ophthalmology

## 2022-04-24 DIAGNOSIS — H04123 Dry eye syndrome of bilateral lacrimal glands: Secondary | ICD-10-CM

## 2022-04-24 DIAGNOSIS — I10 Essential (primary) hypertension: Secondary | ICD-10-CM

## 2022-04-24 DIAGNOSIS — H40003 Preglaucoma, unspecified, bilateral: Secondary | ICD-10-CM

## 2022-04-24 DIAGNOSIS — H35033 Hypertensive retinopathy, bilateral: Secondary | ICD-10-CM

## 2022-04-24 DIAGNOSIS — H34832 Tributary (branch) retinal vein occlusion, left eye, with macular edema: Secondary | ICD-10-CM

## 2022-04-24 DIAGNOSIS — H353131 Nonexudative age-related macular degeneration, bilateral, early dry stage: Secondary | ICD-10-CM | POA: Diagnosis not present

## 2022-04-24 DIAGNOSIS — Z961 Presence of intraocular lens: Secondary | ICD-10-CM

## 2022-04-24 MED ORDER — AFLIBERCEPT 2MG/0.05ML IZ SOLN FOR KALEIDOSCOPE
2.0000 mg | INTRAVITREAL | Status: AC | PRN
  Administered 2022-04-24: 2 mg via INTRAVITREAL

## 2022-04-25 NOTE — Telephone Encounter (Signed)
Spoke to patient, patient reports she has only been taking furosemide (Lasix) 40 mg daily.  She reports her weight continues to decrease-currently 136 lbs.  She reports she has changed her diet and believes this has helped/contributed.     Labs drawn 10/4 day of cardioversion-to be repeated in 2-3 weeks. OV with Dr. Gardiner Rhyme 10/25.  Advised to continue as she is currently taking.  Continue daily weights and monitoring diet/salt intake.   She is aware/encouraged to call with any changes in symptoms or increase in weight.   Patient verbalized understanding.

## 2022-05-02 ENCOUNTER — Telehealth: Payer: Self-pay | Admitting: Cardiology

## 2022-05-02 ENCOUNTER — Ambulatory Visit (INDEPENDENT_AMBULATORY_CARE_PROVIDER_SITE_OTHER): Payer: Medicare Other | Admitting: Internal Medicine

## 2022-05-02 ENCOUNTER — Encounter: Payer: Self-pay | Admitting: Internal Medicine

## 2022-05-02 VITALS — BP 140/70 | HR 86 | Ht 60.0 in | Wt 135.2 lb

## 2022-05-02 DIAGNOSIS — K644 Residual hemorrhoidal skin tags: Secondary | ICD-10-CM

## 2022-05-02 DIAGNOSIS — K648 Other hemorrhoids: Secondary | ICD-10-CM | POA: Diagnosis not present

## 2022-05-02 NOTE — Telephone Encounter (Signed)
Not sure what is causing the weeping if the swelling has resolved.  Recommend discussing with PCP

## 2022-05-02 NOTE — Progress Notes (Signed)
   HEMORRHOID LIGATION  Female staff present  Rectal - external hemorrhoids seen RA friabe  Anoscopy Gr 2 RP>LL and Gr 1 RA internal hemorrhoids + associated external components   PROCEDURE NOTE: The patient presents with symptomatic grade 2  hemorrhoids, requesting rubber band ligation of his/her hemorrhoidal disease.  All risks, benefits and alternative forms of therapy were described and informed consent was obtained.   The anorectum was pre-medicated with 0.125% NTG and 5% lidocaine The decision was made to band the LL and RP internal hemorrhoids, and the [place] was used to perform band ligation without complication.  Digital anorectal examination was then performed to assure proper positioning of the band, and to adjust the banded tissue as required.  The patient was discharged home without pain or other issues.  Dietary and behavioral recommendations were given and along with follow-up instructions.     The following adjunctive treatments were recommended:  Continue metamucil  The patient will return as needed for  follow-up and possible additional banding as required. No complications were encountered and the patient tolerated the procedure well.

## 2022-05-02 NOTE — Patient Instructions (Signed)
HEMORRHOID BANDING PROCEDURE    FOLLOW-UP CARE   The procedure you have had should have been relatively painless since the banding of the area involved does not have nerve endings and there is no pain sensation.  The rubber band cuts off the blood supply to the hemorrhoid and the band may fall off as soon as 48 hours after the banding (the band may occasionally be seen in the toilet bowl following a bowel movement). You may notice a temporary feeling of fullness in the rectum which should respond adequately to plain Tylenol or Motrin.  Following the banding, avoid strenuous exercise that evening and resume full activity the next day.  A sitz bath (soaking in a warm tub) or bidet is soothing, and can be useful for cleansing the area after bowel movements.     To avoid constipation, take two tablespoons of natural wheat bran, natural oat bran, flax, Benefiber or any over the counter fiber supplement and increase your water intake to 7-8 glasses daily. Continue your metamucil.  Unless you have been prescribed anorectal medication, do not put anything inside your rectum for two weeks: No suppositories, enemas, fingers, etc.  Occasionally, you may have more bleeding than usual after the banding procedure.  This is often from the untreated hemorrhoids rather than the treated one.  Don't be concerned if there is a tablespoon or so of blood.  If there is more blood than this, lie flat with your bottom higher than your head and apply an ice pack to the area. If the bleeding does not stop within a half an hour or if you feel faint, call our office at [phone] or go to the emergency room.  Problems are not common; however, if there is a substantial amount of bleeding, severe pain, chills, fever or difficulty passing urine (very rare) or other problems, you should call us at [phone] or report to the nearest emergency room.  Do not stay seated continuously for more than 2-3 hours for a day or  two after the procedure.  Tighten your buttock muscles 10-15 times every two hours and take 10-15 deep breaths every 1-2 hours.  Do not spend more than a few minutes on the toilet if you cannot empty your bowel; instead re-visit the toilet at a later time.   I appreciate the opportunity to care for you. Silvano Rusk, MD, [facility]

## 2022-05-02 NOTE — Telephone Encounter (Signed)
Patient aware and verbalized understanding. °

## 2022-05-02 NOTE — Telephone Encounter (Signed)
Patient called asking that the nurse give her ac cal

## 2022-05-02 NOTE — Telephone Encounter (Signed)
Patient stated the swelling in her legs has resolved, but she is having weeping on her left ankle area. She asked if there is a medication to apply to the site. She is keeping a dressing on it.

## 2022-05-06 ENCOUNTER — Encounter (HOSPITAL_BASED_OUTPATIENT_CLINIC_OR_DEPARTMENT_OTHER): Payer: Self-pay | Admitting: Nurse Practitioner

## 2022-05-06 ENCOUNTER — Ambulatory Visit (HOSPITAL_BASED_OUTPATIENT_CLINIC_OR_DEPARTMENT_OTHER): Payer: Medicare Other | Admitting: Nurse Practitioner

## 2022-05-06 VITALS — BP 138/80 | HR 79 | Ht 60.0 in | Wt 135.9 lb

## 2022-05-06 DIAGNOSIS — R600 Localized edema: Secondary | ICD-10-CM

## 2022-05-06 DIAGNOSIS — Z23 Encounter for immunization: Secondary | ICD-10-CM

## 2022-05-06 NOTE — Patient Instructions (Signed)
I want you to take an extra 1/2 tab of your lasix when you take your dose today to see if we can get a little extra fluid off of your legs. I am hopeful that this will be enough to help let the little wound dry up.   Be sure to tell your cardiologist tomorrow that you took the extra 1/2 of a pill today- he may want you to continue for another day or two.   Try to keep your legs propped as much as you can. Laying on the couch with your legs on a pillow can be very helpful or use another chair to prop your feet up as much as possible.

## 2022-05-06 NOTE — Progress Notes (Addendum)
Patient Janice Render, DNP, AGNP-c Primary Care & Sports Medicine [address]  [place], [place] [address] [phone]  Subjective:   Janice Small is a 88 y.o. female presents to day for evaluation of: Follow-up (Patient presents today for wheeping spot on left leg x 1 week. Would like flu shot while in office. )  Janice Small endorses her LE have been quite swollen in the recent weeks and she noticed about a week ago that one area on her left leg is weeping clear fluid. She has been taking her lasix as directed, but she is still experiencing the significant edema and fluid retention. She denies increased shob, fever, chills, erythema, or pustular drainage from the area. She has been keeping the area clean and as dry as possible.   PMH, Medications, and Allergies reviewed and updated in chart as appropriate.   ROS negative except for what is listed in HPI. Objective:  BP 138/80   Pulse 79   Ht 5' (1.524 m)   Wt 135 lb 14.4 oz (61.6 kg)   SpO2 98%   BMI 26.54 kg/m  Physical Exam Vitals and nursing note reviewed.  Constitutional:      Appearance: Normal appearance.  HENT:     Head: Normocephalic.  Cardiovascular:     Rate and Rhythm: Normal rate and regular rhythm.     Pulses: Normal pulses.     Heart sounds: Normal heart sounds.  Pulmonary:     Effort: Pulmonary effort is normal. No respiratory distress.     Breath sounds: Normal breath sounds. No rhonchi or rales.  Musculoskeletal:     Right lower leg: Edema present.     Left lower leg: Edema present.       Legs:  Skin:    General: Skin is warm.     Capillary Refill: Capillary refill takes less than 2 seconds.  Neurological:     General: No focal deficit present.     Mental Status: She is alert and oriented to person, place, and time.           Assessment & Plan:   Problem List Items Addressed This Visit     Bilateral leg edema - Primary    Pitting edema noted in  both lower extremities with weeping present in the right leg around the mid portion of the anterior shin.  At this time there are no signs of infection which is reassuring.  Discussed options with patient and review of most recent labs.  We will plan to increase current Lasix dose by one half tab daily and monitor closely.  She does have an appointment with cardiology tomorrow and therefore I have encouraged her to discuss the dosage increase with them to ensure that they feel this is appropriate for long-term treatment.  Her lungs are clear today which is also reassuring.  We will plan to follow-up if symptoms worsen or fail to improve please.  Otherwise routine follow-up recommended.      Other Visit Diagnoses     Flu vaccine need       Relevant Orders   Flu vaccine, recombinat, quadrivalent, inj (Completed)         Janice Render, DNP, AGNP-c 06/12/2022  7:51 PM    History, Medications, Surgery, SDOH, and Family History reviewed and updated as appropriate.

## 2022-05-07 ENCOUNTER — Encounter: Payer: Self-pay | Admitting: Cardiology

## 2022-05-07 ENCOUNTER — Other Ambulatory Visit (INDEPENDENT_AMBULATORY_CARE_PROVIDER_SITE_OTHER): Payer: Self-pay

## 2022-05-07 ENCOUNTER — Ambulatory Visit: Payer: Medicare Other | Attending: Cardiology | Admitting: Cardiology

## 2022-05-07 VITALS — BP 144/58 | HR 68 | Ht 60.0 in | Wt 134.6 lb

## 2022-05-07 DIAGNOSIS — I5032 Chronic diastolic (congestive) heart failure: Secondary | ICD-10-CM

## 2022-05-07 DIAGNOSIS — I10 Essential (primary) hypertension: Secondary | ICD-10-CM

## 2022-05-07 DIAGNOSIS — S81809A Unspecified open wound, unspecified lower leg, initial encounter: Secondary | ICD-10-CM | POA: Diagnosis not present

## 2022-05-07 DIAGNOSIS — I4891 Unspecified atrial fibrillation: Secondary | ICD-10-CM | POA: Diagnosis not present

## 2022-05-07 DIAGNOSIS — H353131 Nonexudative age-related macular degeneration, bilateral, early dry stage: Secondary | ICD-10-CM

## 2022-05-07 DIAGNOSIS — H35033 Hypertensive retinopathy, bilateral: Secondary | ICD-10-CM

## 2022-05-07 DIAGNOSIS — H04123 Dry eye syndrome of bilateral lacrimal glands: Secondary | ICD-10-CM

## 2022-05-07 DIAGNOSIS — Z961 Presence of intraocular lens: Secondary | ICD-10-CM

## 2022-05-07 DIAGNOSIS — H40003 Preglaucoma, unspecified, bilateral: Secondary | ICD-10-CM

## 2022-05-07 DIAGNOSIS — H34832 Tributary (branch) retinal vein occlusion, left eye, with macular edema: Secondary | ICD-10-CM

## 2022-05-07 NOTE — Progress Notes (Signed)
Cardiology Office Note:    Date:  05/07/2022   ID:  Janice Small, DOB [birth year], MRN [id number]  PCP:  [name], NP  Cardiologist:  Donato Heinz, MD  Electrophysiologist:  None   Referring MD: [name], NP   Chief Complaint  Patient presents with   Atrial Fibrillation    History of Present Illness:    Janice Small is a 88 y.o. female with a hx of chronic diastolic heart failure, hypertension, hypothyroidism who presents for follow-up.  She was referred by Dr. de [place] for evaluation of atrial fibrillation, initially seen on 01/09/2022.  Noted to be in A-fib at PCP clinic 12/23/2021.  Started on Xarelto 20 mg daily.  She reports that she has been having significant lower extremity, states that she has been putting bandages on her legs because of all the fluid weeping from her legs.  She also reports has been feeling short of breath.  Occurs with minimal exertion such as walking around her house.  Denies any chest pain.  Denies any lightheadedness or syncope.  No bleeding issues on Xarelto.  No smoking history.  Family history includes father had MI in 51s.  At initial clinic visit on 01/09/2022 was noted to be significantly volume overloaded.  She was sent to the ED and admitted for IV diuresis.  Echocardiogram 01/10/2022 showed EF 60 to 65%, normal RV function, moderate left atrial enlargement, mild to moderate mitral regurgitation.  She responded well to IV diuresis and was discharged on Lasix 40 mg daily along with Farxiga 10 mg daily.  Underwent successful cardioversion for A-fib on 04/16/2022.  Since last clinic visit, she reports she is doing okay.  Continues to have some lower extremity edema that has improved though continues to have an area of weeping.  Weight is down 8 pounds.  Denies any chest pain, dyspnea, lightheadedness, syncope, or palpitations.  Taking eliquis, no bleeding issues.    Wt Readings from Last 3 Encounters:  05/07/22 134 lb 9.6 oz (61.1 kg)   05/06/22 135 lb 14.4 oz (61.6 kg)  05/02/22 135 lb 4 oz (61.3 kg)     Past Medical History:  Diagnosis Date   AKI (acute kidney injury) ([place])    BREAST BIOPSY, HX OF 11/27/2006   CHF (congestive heart failure) ([place])    COLONIC POLYPS, HX OF 11/27/2006   HEMORRHOIDS 01/17/2010   HYPERTENSION 11/27/2006   Hypertensive retinopathy    OU   HYPOTHYROIDISM 11/27/2006   Macular degeneration    Dry OU   MENOPAUSAL SYNDROME 11/27/2006   Vascular disease     Past Surgical History:  Procedure Laterality Date   ABDOMINAL HYSTERECTOMY     APPENDECTOMY     BREAST EXCISIONAL BIOPSY Right 1987   BREAST SURGERY     bx   CARDIOVERSION N/A 04/16/2022   Procedure: CARDIOVERSION;  Surgeon: [name], MD;  Location: [facility] ENDOSCOPY;  Service: Cardiovascular;  Laterality: N/A;   CATARACT EXTRACTION Bilateral    EYE SURGERY Bilateral    Cat Sx OU   HEMORRHOID BANDING     TONSILLECTOMY      Current Medications: Current Meds  Medication Sig   amLODipine (NORVASC) 5 MG tablet Take 1 tablet (5 mg total) by mouth daily.   Bromfenac Sodium (PROLENSA) 0.07 % SOLN Place 1 drop into the left eye 4 (four) times daily.   cloNIDine (CATAPRES) 0.1 MG tablet TAKE 1 TABLET BY MOUTH TWICE  DAILY   dapagliflozin propanediol (FARXIGA) 10 MG  TABS tablet Take 1 tablet (10 mg total) by mouth daily.   furosemide (LASIX) 40 MG tablet Take 40 mg by mouth daily.   metoprolol succinate (TOPROL-XL) 25 MG 24 hr tablet Take 1 tablet (25 mg total) by mouth daily.   mupirocin ointment (BACTROBAN) 2 % Apply topically.   potassium chloride (KLOR-CON M) 10 MEQ tablet Take 1 tablet (10 mEq total) by mouth 2 (two) times daily. (Patient taking differently: Take 10 mEq by mouth in the morning.)   prednisoLONE acetate (PRED FORTE) 1 % ophthalmic suspension INSTILL 1 DROP INTO LEFT EYE 4 TIMES A DAY   Rivaroxaban (XARELTO) 15 MG TABS tablet Take 1 tablet (15 mg total) by mouth daily with supper.   SYNTHROID 75 MCG tablet  TAKE 1 TABLET BY MOUTH DAILY   VITAMIN D PO Take 2,000 Units by mouth daily after lunch.     Allergies:   Codeine phosphate   Social History   Socioeconomic History   Marital status: Widowed    Spouse name: Not on file   Number of children: Not on file   Years of education: Not on file   Highest education level: Not on file  Occupational History   Not on file  Tobacco Use   Smoking status: Never   Smokeless tobacco: Never  Vaping Use   Vaping Use: Never used  Substance and Sexual Activity   Alcohol use: Never    Comment: rarely   Drug use: No   Sexual activity: Not Currently  Other Topics Concern   Not on file  Social History Narrative   The patient is retired she has been in [place] for more than 50 years she has 3 sons that she is widowed.   Never tobacco, no alcohol, 2 caffeinated beverages daily no drugs   Social Determinants of Health   Financial Resource Strain: Not on file  Food Insecurity: Not on file  Transportation Needs: Not on file  Physical Activity: Not on file  Stress: Not on file  Social Connections: Not on file     Family History: The patient's family history includes Heart disease in her father. There is no history of Colon cancer, Esophageal cancer, Pancreatic cancer, Liver cancer, or Stomach cancer.  ROS:   Please see the history of present illness.     All other systems reviewed and are negative.  EKGs/Labs/Other Studies Reviewed:    The following studies were reviewed today:   EKG:   01/09/22: Afib, rate 93, low voltage, Q wave in V1/2 01/23/22: Atrial fibrillation, rate 89, low voltage, nonspecific T wave flattening 05/07/22: Change in P wave axis from prior, suspect ectopic atrial rhythm, voltage, nonspecific T wave flattening, rate 68  Recent Labs: 12/23/2021: TSH 2.720 01/09/2022: ALT 43 02/13/2022: NT-Pro BNP 2,289 03/18/2022: BNP CANCELED; BNP 409.5 04/16/2022: BUN 36; Creatinine, Ser 1.26; Hemoglobin 12.2; Magnesium 2.7; Platelets  138; Potassium 4.2; Sodium 141  Recent Lipid Panel    Component Value Date/Time   CHOL 167 11/04/2021 1144   TRIG 58 11/04/2021 1144   HDL 86 11/04/2021 1144   CHOLHDL 1.9 11/04/2021 1144   CHOLHDL 2 12/03/2018 1010   VLDL 18.6 12/03/2018 1010   LDLCALC 69 11/04/2021 1144   LDLDIRECT 104.0 06/17/2013 1046    Physical Exam:    VS:  BP (!) 144/58 (BP Location: Left Arm, Patient Position: Sitting, Cuff Size: Normal)   Pulse 68   Ht 5' (1.524 m)   Wt 134 lb 9.6 oz (61.1 kg)  SpO2 97%   BMI 26.29 kg/m     Wt Readings from Last 3 Encounters:  05/07/22 134 lb 9.6 oz (61.1 kg)  05/06/22 135 lb 14.4 oz (61.6 kg)  05/02/22 135 lb 4 oz (61.3 kg)     GEN:  Well nourished, well developed in no acute distress HEENT: Normal NECK: + JVD CARDIAC: Irregular, normal rate no murmurs, rubs, gallops RESPIRATORY:  Clear to auscultation without rales, wheezing or rhonchi  ABDOMEN: Soft, non-tender, non-distended MUSCULOSKELETAL:  1+ edema SKIN: Warm and dry NEUROLOGIC:  Alert and oriented x 3 PSYCHIATRIC:  Normal affect   ASSESSMENT:    1. Chronic diastolic heart failure ([place])   2. Essential hypertension   3. Atrial fibrillation, unspecified type ([place])   4. Wound of lower extremity, unspecified laterality, initial encounter      PLAN:    Chronic diastolic heart failure: At initial clinic visit on 01/09/2022 was noted to be significantly volume overloaded.  She was sent to the ED and admitted for IV diuresis.  Echocardiogram 01/10/2022 showed EF 60 to 65%, normal RV function, moderate left atrial enlargement, mild to moderate mitral regurgitation.  She responded well to IV diuresis and was discharged on Lasix 40 mg daily along with Farxiga 10 mg daily. -Continue Lasix 40 mg daily and continue Farxiga 10 mg daily.  Check BMET/magnesium/BNP today   Atrial fibrillation: New diagnosis 12/23/2021.  Could be contributing to heart failure as above.  Underwent successful cardioversion for A-fib  on 04/16/2022. -Continue Xarelto -Continue Toprol-XL 25 mg daily -She was having bleeding from hemorrhoids so this was deferred, she recently underwent hemorrhoid banding and reports resolution.  Since she reported no further bleeding, underwent cardioversion on 04/16/2022.  Appears to be maintaining sinus rhythm (vs possible ectopic atrial rhythm as change in P wave axis)  Hypertension: On clonidine 0.1 mg twice daily, amlodipine 5 mg daily, Lasix 40 mg daily, Toprol-XL 25 mg daily.  Previously on lisinopril 40 mg daily, was discontinued during admission 12/2021 due to AKI.   -BP appears controlled.  Discussed switching from amlodipine to alternative antihypertensive as concerned that amlodipine could be contributing to her lower extremity edema but she declines  Hypothyroidism: On levothyroxine 75 mcg daily.  TSH 2.72 on 12/23/2021  LE wounds: areas of redness and weeping in legs, which can progress to open wounds.  Suspect due to venous insufficiency.  Refer to wound clinic   RTC in 2-3 months   Medication Adjustments/Labs and Tests Ordered: Current medicines are reviewed at length with the patient today.  Concerns regarding medicines are outlined above.  Orders Placed This Encounter  Procedures   Basic metabolic panel   Magnesium   Brain natriuretic peptide   AMB referral to wound care center   EKG 12-Lead   No orders of the defined types were placed in this encounter.   Patient Instructions  Medication Instructions:  Continue Lasix 60 mg tomorrow (Thursday) and Friday STARTING SATURDAY-return to taking Lasix 40 mg daily  *If you need a refill on your cardiac medications before your next appointment, please call your pharmacy*  Lab Work: BMET, Engineer, materials, BNP today  If you have labs (blood work) drawn today and your tests are completely normal, you will receive your results only by: [place] (if you have MyChart) OR A paper copy in the mail If you have any lab test that is  abnormal or we need to change your treatment, we will call you to review the results.  Follow-Up: At  [facility], you and your health needs are our priority.  As part of our continuing mission to provide you with exceptional heart care, we have created designated Provider Care Teams.  These Care Teams include your primary Cardiologist (physician) and Advanced Practice Providers (APPs -  Physician Assistants and Nurse Practitioners) who all work together to provide you with the care you need, when you need it.  We recommend signing up for the patient portal called "MyChart".  Sign up information is provided on this After Visit Summary.  MyChart is used to connect with patients for Virtual Visits (Telemedicine).  Patients are able to view lab/test results, encounter notes, upcoming appointments, etc.  Non-urgent messages can be sent to your provider as well.   To learn more about what you can do with MyChart, go to [email or web address].    Your next appointment:   2-3 month(s)  The format for your next appointment:   In Person  Provider:   Donato Heinz, MD     Other Instructions You have been referred to: Wound clinic at [facility], Donato Heinz, MD  05/07/2022 6:00 PM    [place]

## 2022-05-07 NOTE — Progress Notes (Signed)
[facility] Note  05/07/2022    CHIEF COMPLAINT Patient presents for No chief complaint on file.  HISTORY OF PRESENT ILLNESS: Janice Small is a 88 y.o. female who presents to the clinic today for:   Pt slightly delayed to f/u -- almost 5 wks instead of 4. Pt states that she has not been using her drops for last few weeks due to other health issues -- dealing with A fib.   Referring physician: No referring provider defined for this encounter.  HISTORICAL INFORMATION:   Selected notes from the MEDICAL RECORD NUMBER Referred by Dr. Jerline Small for eval of BRVO OS LEE: 10.12.2021 Ocular Hx- Glc suspect   CURRENT MEDICATIONS: Current Outpatient Medications (Ophthalmic Drugs)  Medication Sig   Bromfenac Sodium (PROLENSA) 0.07 % SOLN Place 1 drop into the left eye 4 (four) times daily.   prednisoLONE acetate (PRED FORTE) 1 % ophthalmic suspension INSTILL 1 DROP INTO LEFT EYE 4 TIMES A DAY   No current facility-administered medications for this visit. (Ophthalmic Drugs)   Current Outpatient Medications (Other)  Medication Sig   amLODipine (NORVASC) 5 MG tablet Take 1 tablet (5 mg total) by mouth daily.   cloNIDine (CATAPRES) 0.1 MG tablet TAKE 1 TABLET BY MOUTH TWICE  DAILY   dapagliflozin propanediol (FARXIGA) 10 MG TABS tablet Take 1 tablet (10 mg total) by mouth daily.   furosemide (LASIX) 40 MG tablet Take 40 mg by mouth daily.   metoprolol succinate (TOPROL-XL) 25 MG 24 hr tablet Take 1 tablet (25 mg total) by mouth daily.   potassium chloride (KLOR-CON M) 10 MEQ tablet Take 1 tablet (10 mEq total) by mouth 2 (two) times daily. (Patient taking differently: Take 10 mEq by mouth in the morning.)   Rivaroxaban (XARELTO) 15 MG TABS tablet Take 1 tablet (15 mg total) by mouth daily with supper.   SYNTHROID 75 MCG tablet TAKE 1 TABLET BY MOUTH DAILY   VITAMIN D PO Take 2,000 Units by mouth daily after lunch.   XARELTO 20 MG TABS tablet TAKE 1 TABLET BY  MOUTH DAILY WITH SUPPER.   No current facility-administered medications for this visit. (Other)   REVIEW OF SYSTEMS:   ALLERGIES Allergies  Allergen Reactions   Codeine Phosphate Nausea And Vomiting   PAST MEDICAL HISTORY Past Medical History:  Diagnosis Date   AKI (acute kidney injury) ([place])    BREAST BIOPSY, HX OF 11/27/2006   CHF (congestive heart failure) ([place])    COLONIC POLYPS, HX OF 11/27/2006   HEMORRHOIDS 01/17/2010   HYPERTENSION 11/27/2006   Hypertensive retinopathy    OU   HYPOTHYROIDISM 11/27/2006   Macular degeneration    Dry OU   MENOPAUSAL SYNDROME 11/27/2006   Vascular disease    Past Surgical History:  Procedure Laterality Date   ABDOMINAL HYSTERECTOMY     APPENDECTOMY     BREAST EXCISIONAL BIOPSY Right 1987   BREAST SURGERY     bx   CARDIOVERSION N/A 04/16/2022   Procedure: CARDIOVERSION;  Surgeon: [name], MD;  Location: [facility] ENDOSCOPY;  Service: Cardiovascular;  Laterality: N/A;   CATARACT EXTRACTION Bilateral    EYE SURGERY Bilateral    Cat Sx OU   HEMORRHOID BANDING     TONSILLECTOMY     FAMILY HISTORY Family History  Problem Relation Age of Onset   Heart disease Father    Colon cancer Neg Hx    Esophageal cancer Neg Hx    Pancreatic cancer Neg Hx  Liver cancer Neg Hx    Stomach cancer Neg Hx    SOCIAL HISTORY Social History   Tobacco Use   Smoking status: Never   Smokeless tobacco: Never  Vaping Use   Vaping Use: Never used  Substance Use Topics   Alcohol use: Never    Comment: rarely   Drug use: No       OPHTHALMIC EXAM:  Not recorded    IMAGING AND PROCEDURES  Imaging and Procedures for 05/07/2022          ASSESSMENT/PLAN:    ICD-10-CM   1. Branch retinal vein occlusion of left eye with macular edema  H34.8320     2. Essential hypertension  I10     3. Hypertensive retinopathy of both eyes  H35.033     4. Early dry stage nonexudative age-related macular degeneration of both eyes  H35.3131      5. Pseudophakia, both eyes  Z96.1     6. Glaucoma suspect of both eyes  H40.003     7. Dry eyes  H04.123      1. BRVO w/ CME OS  - slight delay in f/u -- almost 5 wks instead of 4 due to Thursday preference             - S/P IVA OS #1 (10.18.21), #2 (11.15.21), #3 (12.13.21), #4 (01.10.22), #5 (02.07.22), #6 (03.07.22), #7 (04.04.22), #8 (5.2.22) -- IVA resistance  - S/P IVE OS #1 (05.31.22) -- sample, #2 (06.28.22), #3 (07.26.22), #4 (08.23.22), #5 (09.20.22), #6 (10.20.22), #7 (11.17.22), #8 (12.15.22), #9 (01.12.23), #10 (02.09.23), #11 (03.16.23), #12 (04.13.23), #13 (05.11.23), #14 (06.08.23), #15 (07.10.23), #16 (08.11.23),#17 (09.08.23), #18 (10.12.23)             - S/P focal laser OS (02.16.22) - BCVA stable at 20/30 - OCT shows persistent IRF/IRHM temporal macula and fovea -- slightly increased at 5 weeks - reduced PF/Prolensa (for CME component) to BID OS due to steroid response -- pt states she has not been taking due to dealing with other health issues (A fib) - recommend IVE OS #19 today, 11.09.23 with follow up back to 4 weeks - pt wishes to proceed - RBA of procedure discussed, questions answered - informed consent obtained and signed - see procedure note - Avastin informed consent obtained and signed, 10.18.21 (OS) - Eylea informed consent obtained and signed, 05.31.22 - Good Days for [facility] # [id number], 07/14/2021 - 07/13/2022  - cont PF/Prolensa BID OS - F/U 4 weeks, DFE, OCT, possible injection   2,3. Hypertensive retinopathy OU - discussed importance of tight BP control - monitor  4. Age related macular degeneration, non-exudative, both eyes  - The incidence, anatomy, and pathology of dry AMD, risk of progression, and the AREDS and AREDS 2 study including smoking risks discussed with patient.   - recommend Amsler grid monitoring  5. Pseudophakia OU  - s/p CE/IOL  - IOL in good position, doing well  - monitor  6. Glaucoma Suspect  - IOP 16,20  - discussed the  possibility of starting a glaucoma drop in the future    - under the expert care of Dr. Jerline Small  7. Dry eyes OU (OS>OD) - recommend artificial tears and lubricating ointment as needed  Ophthalmic Meds Ordered this visit:  No orders of the defined types were placed in this encounter.    No follow-ups on file.  There are no Patient Instructions on file for this visit.  This document serves as a record of services  personally performed by Gardiner Sleeper, MD, PhD. It was created on their behalf by San Jetty. Owens Shark, OA an ophthalmic technician. The creation of this record is the provider's dictation and/or activities during the visit.    Electronically signed by: San Jetty. Owens Shark, [place] 10.25.2023 12:42 PM    Gardiner Sleeper, M.D., Ph.D. Diseases & Surgery of the Retina and Vitreous [facility]: M myopia (nearsighted); A astigmatism; H hyperopia (farsighted); P presbyopia; Mrx spectacle prescription;  CTL contact lenses; OD right eye; OS left eye; OU both eyes  XT exotropia; ET esotropia; PEK punctate epithelial keratitis; PEE punctate epithelial erosions; DES dry eye syndrome; MGD meibomian gland dysfunction; ATs artificial tears; PFAT's preservative free artificial tears; [place] nuclear sclerotic cataract; PSC posterior subcapsular cataract; ERM epi-retinal membrane; PVD posterior vitreous detachment; RD retinal detachment; DM diabetes mellitus; DR diabetic retinopathy; NPDR non-proliferative diabetic retinopathy; PDR proliferative diabetic retinopathy; CSME clinically significant macular edema; DME diabetic macular edema; dbh dot blot hemorrhages; CWS cotton wool spot; POAG primary open angle glaucoma; C/D cup-to-disc ratio; HVF humphrey visual field; GVF goldmann visual field; OCT optical coherence tomography; IOP intraocular pressure; BRVO Branch retinal vein occlusion; CRVO central retinal vein occlusion; CRAO central retinal artery occlusion; BRAO branch  retinal artery occlusion; RT retinal tear; SB scleral buckle; PPV pars plana vitrectomy; VH Vitreous hemorrhage; PRP panretinal laser photocoagulation; IVK intravitreal kenalog; VMT vitreomacular traction; MH Macular hole;  NVD neovascularization of the disc; NVE neovascularization elsewhere; AREDS age related eye disease study; ARMD age related macular degeneration; POAG primary open angle glaucoma; EBMD epithelial/anterior basement membrane dystrophy; ACIOL anterior chamber intraocular lens; IOL intraocular lens; PCIOL posterior chamber intraocular lens; Phaco/IOL phacoemulsification with intraocular lens placement; [place] photorefractive keratectomy; LASIK laser assisted in situ keratomileusis; HTN hypertension; DM diabetes mellitus; COPD chronic obstructive pulmonary disease

## 2022-05-07 NOTE — Patient Instructions (Addendum)
Medication Instructions:  Continue Lasix 60 mg tomorrow (Thursday) and Friday STARTING SATURDAY-return to taking Lasix 40 mg daily  *If you need a refill on your cardiac medications before your next appointment, please call your pharmacy*  Lab Work: BMET, Engineer, materials, BNP today  If you have labs (blood work) drawn today and your tests are completely normal, you will receive your results only by: [place] (if you have MyChart) OR A paper copy in the mail If you have any lab test that is abnormal or we need to change your treatment, we will call you to review the results.  Follow-Up: At [facility], you and your health needs are our priority.  As part of our continuing mission to provide you with exceptional heart care, we have created designated Provider Care Teams.  These Care Teams include your primary Cardiologist (physician) and Advanced Practice Providers (APPs -  Physician Assistants and Nurse Practitioners) who all work together to provide you with the care you need, when you need it.  We recommend signing up for the patient portal called "MyChart".  Sign up information is provided on this After Visit Summary.  MyChart is used to connect with patients for Virtual Visits (Telemedicine).  Patients are able to view lab/test results, encounter notes, upcoming appointments, etc.  Non-urgent messages can be sent to your provider as well.   To learn more about what you can do with MyChart, go to [email or web address].    Your next appointment:   2-3 month(s)  The format for your next appointment:   In Person  Provider:   Donato Heinz, MD     Other Instructions You have been referred to: Wound clinic at [facility]

## 2022-05-09 LAB — BASIC METABOLIC PANEL
BUN/Creatinine Ratio: 27 (ref 12–28)
BUN: 33 mg/dL (ref 10–36)
CO2: 23 mmol/L (ref 20–29)
Calcium: 10.4 mg/dL — ABNORMAL HIGH (ref 8.7–10.3)
Chloride: 99 mmol/L (ref 96–106)
Creatinine, Ser: 1.24 mg/dL — ABNORMAL HIGH (ref 0.57–1.00)
Glucose: 129 mg/dL — ABNORMAL HIGH (ref 70–99)
Potassium: 4.2 mmol/L (ref 3.5–5.2)
Sodium: 139 mmol/L (ref 134–144)
eGFR: 41 mL/min/{1.73_m2} — ABNORMAL LOW (ref >59)

## 2022-05-09 LAB — BRAIN NATRIURETIC PEPTIDE: BNP: 475.2 pg/mL — ABNORMAL HIGH (ref 0.0–100.0)

## 2022-05-09 LAB — MAGNESIUM: Magnesium: 2.3 mg/dL (ref 1.6–2.3)

## 2022-05-09 NOTE — Progress Notes (Signed)
[facility] Note  05/22/2022    CHIEF COMPLAINT Patient presents for Retina Follow Up  HISTORY OF PRESENT ILLNESS: Janice Small is a 88 y.o. female who presents to the clinic today for:  HPI     Retina Follow Up   Patient presents with  CRVO/BRVO.  In left eye.  This started 4 weeks ago.  I, the attending physician,  performed the HPI with the patient and updated documentation appropriately.        Comments   Patient here for 4 weeks retina follow up for BRVO OS. Patient states vision some days better than other days. Hard to read small print. Like on medicine bottles. No eye pain.       Last edited by Bernarda Caffey, MD on 05/22/2022  9:29 AM.    Pt states she is on new medications bc she had a procedure to "shock" her heart back into rhythm   Referring physician: [name], NP [address] [place],  [place] [address]  HISTORICAL INFORMATION:   Selected notes from the MEDICAL RECORD NUMBER Referred by Dr. Jerline Pain for eval of BRVO OS LEE: 10.12.2021 Ocular Hx- Glc suspect   CURRENT MEDICATIONS: Current Outpatient Medications (Ophthalmic Drugs)  Medication Sig   Bromfenac Sodium (PROLENSA) 0.07 % SOLN Place 1 drop into the left eye 4 (four) times daily.   prednisoLONE acetate (PRED FORTE) 1 % ophthalmic suspension INSTILL 1 DROP INTO LEFT EYE 4 TIMES A DAY   No current facility-administered medications for this visit. (Ophthalmic Drugs)   Current Outpatient Medications (Other)  Medication Sig   amLODipine (NORVASC) 5 MG tablet Take 1 tablet (5 mg total) by mouth daily.   cloNIDine (CATAPRES) 0.1 MG tablet TAKE 1 TABLET BY MOUTH TWICE  DAILY   dapagliflozin propanediol (FARXIGA) 10 MG TABS tablet Take 1 tablet (10 mg total) by mouth daily.   furosemide (LASIX) 40 MG tablet Take 40 mg by mouth daily.   metoprolol succinate (TOPROL-XL) 25 MG 24 hr tablet Take 1 tablet (25 mg total) by mouth daily.   mupirocin ointment  (BACTROBAN) 2 % Apply topically.   potassium chloride (KLOR-CON M) 10 MEQ tablet Take 1 tablet (10 mEq total) by mouth 2 (two) times daily. (Patient taking differently: Take 10 mEq by mouth in the morning.)   Rivaroxaban (XARELTO) 15 MG TABS tablet Take 1 tablet (15 mg total) by mouth daily with supper.   SYNTHROID 75 MCG tablet TAKE 1 TABLET BY MOUTH DAILY   VITAMIN D PO Take 2,000 Units by mouth daily after lunch.   CLOBETASOL PROPIONATE E 0.05 % emollient cream Apply topically 2 (two) times daily. (Patient not taking: Reported on 05/07/2022)   XARELTO 20 MG TABS tablet TAKE 1 TABLET BY MOUTH DAILY WITH SUPPER. (Patient not taking: Reported on 05/07/2022)   No current facility-administered medications for this visit. (Other)   REVIEW OF SYSTEMS: ROS   Positive for: Gastrointestinal, Musculoskeletal, Eyes Negative for: Constitutional, Neurological, Skin, Genitourinary, HENT, Endocrine, Cardiovascular, Respiratory, Psychiatric, Allergic/Imm, Heme/Lymph Last edited by Theodore Demark, COA on 05/22/2022  8:31 AM.     ALLERGIES Allergies  Allergen Reactions   Codeine Phosphate Nausea And Vomiting   PAST MEDICAL HISTORY Past Medical History:  Diagnosis Date   AKI (acute kidney injury) ([place])    BREAST BIOPSY, HX OF 11/27/2006   CHF (congestive heart failure) ([place])    COLONIC POLYPS, HX OF 11/27/2006   HEMORRHOIDS 01/17/2010   HYPERTENSION  11/27/2006   Hypertensive retinopathy    OU   HYPOTHYROIDISM 11/27/2006   Macular degeneration    Dry OU   MENOPAUSAL SYNDROME 11/27/2006   Vascular disease    Past Surgical History:  Procedure Laterality Date   ABDOMINAL HYSTERECTOMY     APPENDECTOMY     BREAST EXCISIONAL BIOPSY Right 1987   BREAST SURGERY     bx   CARDIOVERSION N/A 04/16/2022   Procedure: CARDIOVERSION;  Surgeon: [name], MD;  Location: [facility] ENDOSCOPY;  Service: Cardiovascular;  Laterality: N/A;   CATARACT EXTRACTION Bilateral    EYE SURGERY Bilateral    Cat  Sx OU   HEMORRHOID BANDING     TONSILLECTOMY     FAMILY HISTORY Family History  Problem Relation Age of Onset   Heart disease Father    Colon cancer Neg Hx    Esophageal cancer Neg Hx    Pancreatic cancer Neg Hx    Liver cancer Neg Hx    Stomach cancer Neg Hx    SOCIAL HISTORY Social History   Tobacco Use   Smoking status: Never   Smokeless tobacco: Never  Vaping Use   Vaping Use: Never used  Substance Use Topics   Alcohol use: Never    Comment: rarely   Drug use: No       OPHTHALMIC EXAM:  Base Eye Exam     Visual Acuity (Snellen - Linear)       Right Left   Dist cc 20/20 -1 20/30 -2   Dist ph cc  20/25 -2    Correction: Glasses         Tonometry (Tonopen, 8:28 AM)       Right Left   Pressure 18 21         Pupils       Dark Light Shape React APD   Right 3 2 Round Brisk None   Left 3 2 Round Brisk None         Visual Fields (Counting fingers)       Left Right    Full Full         Extraocular Movement       Right Left    Full, Ortho Full, Ortho         Neuro/Psych     Oriented x3: Yes   Mood/Affect: Normal         Dilation     Both eyes: 1.0% Mydriacyl, 2.5% Phenylephrine @ 8:28 AM           Slit Lamp and Fundus Exam     Slit Lamp Exam       Right Left   Lids/Lashes Dermatochalasis - upper lid Dermatochalasis - upper lid, mild Meibomian gland dysfunction   Conjunctiva/Sclera White and quiet White and quiet   Cornea 2+ inferior Punctate epithelial erosions, well healed temporal cataract wounds, arcus, early band K nasal and temporal, tear film debris 3+ inferior Punctate epithelial erosions, irregular epi, well healed temporal cataract wounds, arcus, tear film debris   Anterior Chamber Deep and quiet Deep and quiet   Iris Round and dilated Round and dilated   Lens 3 piece PC IOL in good position with open PC 3 piece PC IOL in good position, 1+PCO (non-central)   Anterior Vitreous Vitreous syneresis Vitreous  syneresis, Posterior vitreous detachment         Fundus Exam       Right Left   Disc Pink and Sharp, Compact mild Pallor, [place] Ard  rim, temporal PPA, Compact   C/D Ratio 0.5 0.4   Macula Flat, Blunted foveal reflex, mild Drusen, RPE mottling and clumping, no heme or edema Blunted foveal reflex, focal IRH and exudates temporal macula -- slightly improved, cystic changes and edema -- slightly increased, +focal laser changes, Drusen   Vessels attenuated, Tortuous attenuated, Tortuous   Periphery Attached, No heme  Attached, mild reticular degeneration           Refraction     Wearing Rx       Sphere Cylinder Axis Add   Right -1.00 +0.50 027 +2.50   Left -0.75 +0.75 083 +2.50    Type: progressive           IMAGING AND PROCEDURES  Imaging and Procedures for 05/22/2022  OCT, Retina - OU - Both Eyes       Right Eye Quality was good. Central Foveal Thickness: 266. Progression has been stable. Findings include normal foveal contour, no IRF, no SRF, retinal drusen .   Left Eye Quality was good. Central Foveal Thickness: 272. Progression has worsened. Findings include normal foveal contour, no SRF, retinal drusen , intraretinal hyper-reflective material, intraretinal fluid (Persistent IRF/IRHM temporal macula and fovea -- increased).   Notes *Images captured and stored on drive  Diagnosis / Impression:  OD: non-exu ARMD w/ fine drusen OS: BRVO w/ CME -- Persistent IRF/IRHM temporal macula and fovea -- increased  Clinical management:  See below  Abbreviations: NFP - Normal foveal profile. CME - cystoid macular edema. PED - pigment epithelial detachment. IRF - intraretinal fluid. SRF - subretinal fluid. EZ - ellipsoid zone. ERM - epiretinal membrane. ORA - outer retinal atrophy. ORT - outer retinal tubulation. SRHM - subretinal hyper-reflective material. IRHM - intraretinal hyper-reflective material      Intravitreal Injection, Pharmacologic Agent - OS - Left Eye        Time Out 05/22/2022. 9:13 AM. Confirmed correct patient, procedure, site, and patient consented.   Anesthesia Topical anesthesia was used. Anesthetic medications included Lidocaine 2%, Proparacaine 0.5%.   Procedure Preparation included 5% betadine to ocular surface, eyelid speculum. A (32g) needle was used.   Injection: 2 mg aflibercept 2 MG/0.05ML   Route: Intravitreal, Site: Left Eye   NDC: A3590391, Lot: [id number], Expiration date: 08/14/2023, Waste: 0 mL   Post-op Post injection exam found visual acuity of at least counting fingers. The patient tolerated the procedure well. There were no complications. The patient received written and verbal post procedure care education. Post injection medications were not given.             ASSESSMENT/PLAN:    ICD-10-CM   1. Branch retinal vein occlusion of left eye with macular edema  H34.8320 OCT, Retina - OU - Both Eyes    Intravitreal Injection, Pharmacologic Agent - OS - Left Eye    aflibercept (EYLEA) SOLN 2 mg    2. Essential hypertension  I10     3. Hypertensive retinopathy of both eyes  H35.033     4. Early dry stage nonexudative age-related macular degeneration of both eyes  H35.3131     5. Pseudophakia, both eyes  Z96.1     6. Glaucoma suspect of both eyes  H40.003     7. Dry eyes  H04.123      1. BRVO w/ CME OS  - slight delay in f/u -- almost 5 wks instead of 4 due to Thursday preference             -  S/P IVA OS #1 (10.18.21), #2 (11.15.21), #3 (12.13.21), #4 (01.10.22), #5 (02.07.22), #6 (03.07.22), #7 (04.04.22), #8 (5.2.22) -- IVA resistance  - S/P IVE OS #1 (05.31.22) -- sample, #2 (06.28.22), #3 (07.26.22), #4 (08.23.22), #5 (09.20.22), #6 (10.20.22), #7 (11.17.22), #8 (12.15.22), #9 (01.12.23), #10 (02.09.23), #11 (03.16.23), #12 (04.13.23), #13 (05.11.23), #14 (06.08.23), #15 (07.10.23), #16 (08.11.23),#17 (09.08.23), #18 (10.12.23)             - S/P focal laser OS (02.16.22) - BCVA stable at 20/30 -  OCT shows persistent IRF/IRHM temporal macula and fovea -- slightly increased at 5 weeks - reduced PF/Prolensa (for CME component) to BID OS due to steroid response -- pt states she has not been taking due to dealing with other health issues (A fib) - recommend IVE OS #19 today, 11.09.23 with follow up back to 4 weeks - pt wishes to proceed - RBA of procedure discussed, questions answered - informed consent obtained and signed - see procedure note - Avastin informed consent obtained and signed, 10.18.21 (OS) - Eylea informed consent obtained and signed, 05.31.22 - Good Days for [facility] # [id number], 07/14/2021 - 07/13/2022  - cont PF/Prolensa BID OS - F/U 4 weeks, DFE, OCT, possible injection   2,3. Hypertensive retinopathy OU - discussed importance of tight BP control - monitor  4. Age related macular degeneration, non-exudative, both eyes  - The incidence, anatomy, and pathology of dry AMD, risk of progression, and the AREDS and AREDS 2 study including smoking risks discussed with patient.   - recommend Amsler grid monitoring  5. Pseudophakia OU  - s/p CE/IOL  - IOL in good position, doing well  - monitor  6. Glaucoma Suspect  - IOP 16,20  - discussed the possibility of starting a glaucoma drop in the future    - under the expert care of Dr. Jerline Pain  7. Dry eyes OU (OS>OD) - recommend artificial tears and lubricating ointment as needed  Ophthalmic Meds Ordered this visit:  Meds ordered this encounter  Medications   aflibercept (EYLEA) SOLN 2 mg     Return in about 4 weeks (around 06/19/2022) for f/u BRVO OS, DFE, OCT.  There are no Patient Instructions on file for this visit.  This document serves as a record of services personally performed by Gardiner Sleeper, MD, PhD. It was created on their behalf by San Jetty. Owens Shark, OA an ophthalmic technician. The creation of this record is the provider's dictation and/or activities during the visit.    Electronically signed by: San Jetty.  Owens Shark, [place] 10.25.2023 9:36 AM  Gardiner Sleeper, M.D., Ph.D. Diseases & Surgery of the Retina and Vitreous [facility]  I have reviewed the above documentation for accuracy and completeness, and I agree with the above. Gardiner Sleeper, M.D., Ph.D. 05/22/22 9:36 AM  Abbreviations: M myopia (nearsighted); A astigmatism; H hyperopia (farsighted); P presbyopia; Mrx spectacle prescription;  CTL contact lenses; OD right eye; OS left eye; OU both eyes  XT exotropia; ET esotropia; PEK punctate epithelial keratitis; PEE punctate epithelial erosions; DES dry eye syndrome; MGD meibomian gland dysfunction; ATs artificial tears; PFAT's preservative free artificial tears; [place] nuclear sclerotic cataract; PSC posterior subcapsular cataract; ERM epi-retinal membrane; PVD posterior vitreous detachment; RD retinal detachment; DM diabetes mellitus; DR diabetic retinopathy; NPDR non-proliferative diabetic retinopathy; PDR proliferative diabetic retinopathy; CSME clinically significant macular edema; DME diabetic macular edema; dbh dot blot hemorrhages; CWS cotton wool spot; POAG primary open angle glaucoma; C/D cup-to-disc ratio; HVF  humphrey visual field; GVF goldmann visual field; OCT optical coherence tomography; IOP intraocular pressure; BRVO Branch retinal vein occlusion; CRVO central retinal vein occlusion; CRAO central retinal artery occlusion; BRAO branch retinal artery occlusion; RT retinal tear; SB scleral buckle; PPV pars plana vitrectomy; VH Vitreous hemorrhage; PRP panretinal laser photocoagulation; IVK intravitreal kenalog; VMT vitreomacular traction; MH Macular hole;  NVD neovascularization of the disc; NVE neovascularization elsewhere; AREDS age related eye disease study; ARMD age related macular degeneration; POAG primary open angle glaucoma; EBMD epithelial/anterior basement membrane dystrophy; ACIOL anterior chamber intraocular lens; IOL intraocular lens; PCIOL posterior chamber  intraocular lens; Phaco/IOL phacoemulsification with intraocular lens placement; [place] photorefractive keratectomy; LASIK laser assisted in situ keratomileusis; HTN hypertension; DM diabetes mellitus; COPD chronic obstructive pulmonary disease

## 2022-05-12 NOTE — Telephone Encounter (Signed)
Patient is scheduled for 05/21/2022 for the Prolia injection.

## 2022-05-14 ENCOUNTER — Encounter: Payer: Self-pay | Admitting: *Deleted

## 2022-05-21 ENCOUNTER — Ambulatory Visit: Payer: Medicare Other

## 2022-05-21 DIAGNOSIS — M818 Other osteoporosis without current pathological fracture: Secondary | ICD-10-CM | POA: Diagnosis not present

## 2022-05-21 MED ORDER — DENOSUMAB 60 MG/ML ~~LOC~~ SOSY
60.0000 mg | PREFILLED_SYRINGE | Freq: Once | SUBCUTANEOUS | Status: AC
  Administered 2022-05-21: 60 mg via SUBCUTANEOUS

## 2022-05-21 NOTE — Progress Notes (Signed)
Patient verbally confirmed name, date of birth, and correct medication to be administered. Prolia injection administered and pt tolerated well.  

## 2022-05-22 ENCOUNTER — Encounter (INDEPENDENT_AMBULATORY_CARE_PROVIDER_SITE_OTHER): Payer: Medicare Other | Admitting: Ophthalmology

## 2022-05-22 ENCOUNTER — Encounter (INDEPENDENT_AMBULATORY_CARE_PROVIDER_SITE_OTHER): Payer: Self-pay | Admitting: Ophthalmology

## 2022-05-22 ENCOUNTER — Ambulatory Visit (INDEPENDENT_AMBULATORY_CARE_PROVIDER_SITE_OTHER): Payer: Medicare Other | Admitting: Ophthalmology

## 2022-05-22 DIAGNOSIS — H40003 Preglaucoma, unspecified, bilateral: Secondary | ICD-10-CM

## 2022-05-22 DIAGNOSIS — I10 Essential (primary) hypertension: Secondary | ICD-10-CM | POA: Diagnosis not present

## 2022-05-22 DIAGNOSIS — H04123 Dry eye syndrome of bilateral lacrimal glands: Secondary | ICD-10-CM

## 2022-05-22 DIAGNOSIS — H35033 Hypertensive retinopathy, bilateral: Secondary | ICD-10-CM

## 2022-05-22 DIAGNOSIS — Z961 Presence of intraocular lens: Secondary | ICD-10-CM

## 2022-05-22 DIAGNOSIS — H34832 Tributary (branch) retinal vein occlusion, left eye, with macular edema: Secondary | ICD-10-CM

## 2022-05-22 DIAGNOSIS — H353131 Nonexudative age-related macular degeneration, bilateral, early dry stage: Secondary | ICD-10-CM | POA: Diagnosis not present

## 2022-05-22 MED ORDER — AFLIBERCEPT 2MG/0.05ML IZ SOLN FOR KALEIDOSCOPE
2.0000 mg | INTRAVITREAL | Status: AC | PRN
  Administered 2022-05-22: 2 mg via INTRAVITREAL

## 2022-05-23 ENCOUNTER — Encounter (HOSPITAL_BASED_OUTPATIENT_CLINIC_OR_DEPARTMENT_OTHER): Payer: Medicare Other | Admitting: Internal Medicine

## 2022-06-04 ENCOUNTER — Telehealth: Payer: Self-pay | Admitting: Cardiology

## 2022-06-04 NOTE — Telephone Encounter (Signed)
Pt would know with all the medications that she is on, is she okay to drink wine during the holiday. Please advise

## 2022-06-04 NOTE — Telephone Encounter (Signed)
May become drowsy with clonidine. Bleeding risk increased with Xarelto. Ok to drink in moderation

## 2022-06-04 NOTE — Telephone Encounter (Signed)
Attempted to contact patient, patient did not answer.  Will try again later.

## 2022-06-12 NOTE — Assessment & Plan Note (Signed)
Pitting edema noted in both lower extremities with weeping present in the right leg around the mid portion of the anterior shin.  At this time there are no signs of infection which is reassuring.  Discussed options with patient and review of most recent labs.  We will plan to increase current Lasix dose by one half tab daily and monitor closely.  She does have an appointment with cardiology tomorrow and therefore I have encouraged her to discuss the dosage increase with them to ensure that they feel this is appropriate for long-term treatment.  Her lungs are clear today which is also reassuring.  We will plan to follow-up if symptoms worsen or fail to improve please.  Otherwise routine follow-up recommended.

## 2022-06-12 NOTE — Progress Notes (Signed)
[facility] Note  06/19/2022    CHIEF COMPLAINT Patient presents for Retina Follow Up  HISTORY OF PRESENT ILLNESS: Janice Small is a 88 y.o. female who presents to the clinic today for:  HPI     Retina Follow Up   Patient presents with  CRVO/BRVO.  In left eye.  This started months ago.  Duration of 4 weeks.  I, the attending physician,  performed the HPI with the patient and updated documentation appropriately.        Comments   Patient states that she does not see well anymore. She is PF/Bromfenac OS BID.       Last edited by Bernarda Caffey, MD on 06/19/2022  9:12 AM.     Pt states   Referring physician: de [place], Raymond J, MD [place],  [place] [id number]  HISTORICAL INFORMATION:   Selected notes from the MEDICAL RECORD NUMBER Referred by Dr. Jerline Pain for eval of BRVO OS LEE: 10.12.2021 Ocular Hx- Glc suspect   CURRENT MEDICATIONS: Current Outpatient Medications (Ophthalmic Drugs)  Medication Sig   Bromfenac Sodium (PROLENSA) 0.07 % SOLN Place 1 drop into the left eye 4 (four) times daily.   prednisoLONE acetate (PRED FORTE) 1 % ophthalmic suspension INSTILL 1 DROP INTO LEFT EYE 4 TIMES A DAY   No current facility-administered medications for this visit. (Ophthalmic Drugs)   Current Outpatient Medications (Other)  Medication Sig   amLODipine (NORVASC) 5 MG tablet Take 1 tablet (5 mg total) by mouth daily.   CLOBETASOL PROPIONATE E 0.05 % emollient cream Apply topically 2 (two) times daily. (Patient not taking: Reported on 05/07/2022)   cloNIDine (CATAPRES) 0.1 MG tablet TAKE 1 TABLET BY MOUTH TWICE  DAILY   dapagliflozin propanediol (FARXIGA) 10 MG TABS tablet Take 1 tablet (10 mg total) by mouth daily.   furosemide (LASIX) 40 MG tablet Take 40 mg by mouth daily.   metoprolol succinate (TOPROL-XL) 25 MG 24 hr tablet Take 1 tablet (25 mg total) by mouth daily.   mupirocin ointment (BACTROBAN) 2 % Apply topically.    potassium chloride (KLOR-CON M) 10 MEQ tablet Take 1 tablet (10 mEq total) by mouth 2 (two) times daily. (Patient taking differently: Take 10 mEq by mouth in the morning.)   Rivaroxaban (XARELTO) 15 MG TABS tablet Take 1 tablet (15 mg total) by mouth daily with supper.   SYNTHROID 75 MCG tablet TAKE 1 TABLET BY MOUTH DAILY   VITAMIN D PO Take 2,000 Units by mouth daily after lunch.   No current facility-administered medications for this visit. (Other)   REVIEW OF SYSTEMS: ROS   Positive for: Gastrointestinal, Musculoskeletal, Eyes Negative for: Constitutional, Neurological, Skin, Genitourinary, HENT, Endocrine, Cardiovascular, Respiratory, Psychiatric, Allergic/Imm, Heme/Lymph Last edited by Annie Paras, COT on 06/19/2022  8:16 AM.     ALLERGIES Allergies  Allergen Reactions   Codeine Phosphate Nausea And Vomiting   PAST MEDICAL HISTORY Past Medical History:  Diagnosis Date   AKI (acute kidney injury) ([place])    BREAST BIOPSY, HX OF 11/27/2006   CHF (congestive heart failure) ([place])    COLONIC POLYPS, HX OF 11/27/2006   HEMORRHOIDS 01/17/2010   HYPERTENSION 11/27/2006   Hypertensive retinopathy    OU   HYPOTHYROIDISM 11/27/2006   Macular degeneration    Dry OU   MENOPAUSAL SYNDROME 11/27/2006   Vascular disease    Past Surgical History:  Procedure Laterality Date   ABDOMINAL HYSTERECTOMY     APPENDECTOMY  BREAST EXCISIONAL BIOPSY Right 1987   BREAST SURGERY     bx   CARDIOVERSION N/A 04/16/2022   Procedure: CARDIOVERSION;  Surgeon: [name], MD;  Location: [facility] ENDOSCOPY;  Service: Cardiovascular;  Laterality: N/A;   CATARACT EXTRACTION Bilateral    EYE SURGERY Bilateral    Cat Sx OU   HEMORRHOID BANDING     TONSILLECTOMY     FAMILY HISTORY Family History  Problem Relation Age of Onset   Heart disease Father    Colon cancer Neg Hx    Esophageal cancer Neg Hx    Pancreatic cancer Neg Hx    Liver cancer Neg Hx    Stomach cancer Neg Hx     SOCIAL HISTORY Social History   Tobacco Use   Smoking status: Never   Smokeless tobacco: Never  Vaping Use   Vaping Use: Never used  Substance Use Topics   Alcohol use: Never    Comment: rarely   Drug use: No       OPHTHALMIC EXAM:  Base Eye Exam     Visual Acuity (Snellen - Linear)       Right Left   Dist cc 20/25 20/40   Dist ph cc  NI    Correction: Glasses         Tonometry (Tonopen, 8:20 AM)       Right Left   Pressure 18 21         Pupils       Dark Light Shape React APD   Right 3 2 Round Brisk None   Left 3 2 Round Brisk None         Visual Fields       Left Right    Full Full         Extraocular Movement       Right Left    Full, Ortho Full, Ortho         Neuro/Psych     Oriented x3: Yes   Mood/Affect: Normal         Dilation     Both eyes: 2.5% Phenylephrine, 1.0% Mydriacyl @ 8:16 AM           Slit Lamp and Fundus Exam     Slit Lamp Exam       Right Left   Lids/Lashes Dermatochalasis - upper lid Dermatochalasis - upper lid, mild Meibomian gland dysfunction   Conjunctiva/Sclera White and quiet White and quiet   Cornea 2+ inferior Punctate epithelial erosions, well healed temporal cataract wounds, arcus, early band K nasal and temporal, tear film debris 3+ inferior Punctate epithelial erosions, irregular epi, well healed temporal cataract wounds, arcus, tear film debris   Anterior Chamber Deep and quiet Deep and quiet   Iris Round and dilated Round and dilated   Lens 3 piece PC IOL in good position with open PC 3 piece PC IOL in good position, 1+PCO (non-central)   Anterior Vitreous Vitreous syneresis Vitreous syneresis, Posterior vitreous detachment         Fundus Exam       Right Left   Disc Pink and Sharp, Compact mild Pallor, Sharp rim, temporal PPA, Compact   C/D Ratio 0.5 0.4   Macula Flat, Blunted foveal reflex, mild Drusen, RPE mottling and clumping, no heme or edema Blunted foveal reflex, focal  IRH and exudates temporal macula -- slightly improved, cystic changes and edema -- slightly improved, +focal laser changes, Drusen   Vessels attenuated, Tortuous attenuated, Tortuous  Periphery Attached, No heme  Attached, mild reticular degeneration           Refraction     Wearing Rx       Sphere Cylinder Axis Add   Right -1.00 +0.50 027 +2.50   Left -0.75 +0.75 083 +2.50    Type: progressive           IMAGING AND PROCEDURES  Imaging and Procedures for 06/19/2022  OCT, Retina - OU - Both Eyes       Right Eye Quality was good. Central Foveal Thickness: 269. Progression has been stable. Findings include normal foveal contour, no IRF, no SRF, retinal drusen .   Left Eye Quality was good. Central Foveal Thickness: 272. Progression has improved. Findings include normal foveal contour, no SRF, retinal drusen , intraretinal hyper-reflective material, intraretinal fluid (Persistent IRF/IRHM temporal macula and fovea -- slightly improved).   Notes *Images captured and stored on drive  Diagnosis / Impression:  OD: non-exu ARMD w/ fine drusen OS: BRVO w/ CME -- Persistent IRF/IRHM temporal macula and fovea -- slightly improved  Clinical management:  See below  Abbreviations: NFP - Normal foveal profile. CME - cystoid macular edema. PED - pigment epithelial detachment. IRF - intraretinal fluid. SRF - subretinal fluid. EZ - ellipsoid zone. ERM - epiretinal membrane. ORA - outer retinal atrophy. ORT - outer retinal tubulation. SRHM - subretinal hyper-reflective material. IRHM - intraretinal hyper-reflective material      Intravitreal Injection, Pharmacologic Agent - OS - Left Eye       Time Out 06/19/2022. 8:38 AM. Confirmed correct patient, procedure, site, and patient consented.   Anesthesia Topical anesthesia was used. Anesthetic medications included Lidocaine 2%, Proparacaine 0.5%.   Procedure Preparation included 5% betadine to ocular surface, eyelid speculum. A  (32g) needle was used.   Injection: 6 mg faricimab-svoa 6 MG/0.05ML   Route: Intravitreal, Site: Left Eye   NDC: 62952-841-32, Lot: [id number], Expiration date: 01/11/2024, Waste: 0 mL   Post-op Post injection exam found visual acuity of at least counting fingers. The patient tolerated the procedure well. There were no complications. The patient received written and verbal post procedure care education. Post injection medications were not given.   Notes **SAMPLE MEDICATION ADMINISTERED**           ASSESSMENT/PLAN:    ICD-10-CM   1. Branch retinal vein occlusion of left eye with macular edema  H34.8320 OCT, Retina - OU - Both Eyes    Intravitreal Injection, Pharmacologic Agent - OS - Left Eye    faricimab-svoa (VABYSMO) '6mg'$ /0.53m intravitreal injection    2. Essential hypertension  I10     3. Hypertensive retinopathy of both eyes  H35.033     4. Early dry stage nonexudative age-related macular degeneration of both eyes  H35.3131     5. Pseudophakia, both eyes  Z96.1     6. Glaucoma suspect of both eyes  H40.003     7. Dry eyes  H04.123      1. BRVO w/ CME OS             - S/P IVA OS #1 (10.18.21), #2 (11.15.21), #3 (12.13.21), #4 (01.10.22), #5 (02.07.22), #6 (03.07.22), #7 (04.04.22), #8 (5.2.22) -- IVA resistance  - S/P IVE OS #1 (05.31.22) -- sample, #2 (06.28.22), #3 (07.26.22), #4 (08.23.22), #5 (09.20.22), #6 (10.20.22), #7 (11.17.22), #8 (12.15.22), #9 (01.12.23), #10 (02.09.23), #11 (03.16.23), #12 (04.13.23), #13 (05.11.23), #14 (06.08.23), #15 (07.10.23), #16 (08.11.23),#17 (09.08.23), #18 (10.12.23), #19 (11.09.23)             -  S/P focal laser OS (02.16.22) - BCVA decreased to 20/40 from 20/30 - OCT shows OS: Persistent IRF/IRHM temporal macula and fovea -- slightly improved - reduced PF/Prolensa (for CME component) to BID OS due to steroid response -- pt states she has not been taking due to dealing with other health issues (A fib) - discussed IVE resistance and  possible switch in medication - recommend switching to Vabysmo OS #1 (sample) today, 12.07.23 -- will get insurance auth for next visit - pt wishes to proceed - RBA of procedure discussed, questions answered - informed consent obtained and signed - see procedure note - Avastin informed consent obtained and signed, 10.18.21 (OS) - Eylea informed consent obtained and signed, 05.31.22 (OS) - Vabysmo informed consent obtained and signed, 12.07.23 (OS) - Good Days for Eylea # [id number], 07/14/2021 - 07/13/2022  - cont PF/Prolensa BID OS - F/U 4 weeks, DFE, OCT, possible injection   2,3. Hypertensive retinopathy OU - discussed importance of tight BP control - monitor  4. Age related macular degeneration, non-exudative, both eyes  - The incidence, anatomy, and pathology of dry AMD, risk of progression, and the AREDS and AREDS 2 study including smoking risks discussed with patient.   - recommend Amsler grid monitoring  5. Pseudophakia OU  - s/p CE/IOL  - IOL in good position, doing well  - monitor  6. Glaucoma Suspect  - IOP 18,21  - discussed the possibility of starting a glaucoma drop in the future    - under the expert care of Dr. Jerline Pain  7. Dry eyes OU (OS>OD) - recommend artificial tears and lubricating ointment as needed  Ophthalmic Meds Ordered this visit:  Meds ordered this encounter  Medications   faricimab-svoa (VABYSMO) '6mg'$ /0.53m intravitreal injection     Return in about 4 weeks (around 07/17/2022) for f/u BRVO OS, DFE, OCT.  There are no Patient Instructions on file for this visit.  This document serves as a record of services personally performed by BGardiner Sleeper MD, PhD. It was created on their behalf by ASan Jetty BOwens Shark OA an ophthalmic technician. The creation of this record is the provider's dictation and/or activities during the visit.    Electronically signed by: ASan Jetty BOwens Shark O[place]11.30.2023 10:43 AM  BGardiner Sleeper M.D., Ph.D. Diseases & Surgery of the  Retina and Vitreous [facility] I have reviewed the above documentation for accuracy and completeness, and I agree with the above. BGardiner Sleeper M.D., Ph.D. 06/19/22 10:43 AM  Abbreviations: M myopia (nearsighted); A astigmatism; H hyperopia (farsighted); P presbyopia; Mrx spectacle prescription;  CTL contact lenses; OD right eye; OS left eye; OU both eyes  XT exotropia; ET esotropia; PEK punctate epithelial keratitis; PEE punctate epithelial erosions; DES dry eye syndrome; MGD meibomian gland dysfunction; ATs artificial tears; PFAT's preservative free artificial tears; [place] s[place] cataract; PSC posterior subcapsular cataract; ERM epi-retinal membrane; PVD posterior vitreous detachment; RD retinal detachment; DM diabetes mellitus; DR diabetic retinopathy; NPDR non-proliferative diabetic retinopathy; PDR proliferative diabetic retinopathy; CSME clinically significant macular edema; DME diabetic macular edema; dbh dot blot hemorrhages; CWS cotton wool spot; POAG primary open angle glaucoma; C/D cup-to-disc ratio; HVF humphrey visual field; GVF goldmann visual field; OCT optical coherence tomography; IOP intraocular pressure; BRVO Branch retinal vein occlusion; CRVO central retinal vein occlusion; CRAO central retinal artery occlusion; BRAO branch retinal artery occlusion; RT retinal tear; SB scleral buckle; PPV pars plana vitrectomy; VH Vitreous hemorrhage; PRP panretinal laser photocoagulation; IVK intravitreal kenalog;  VMT vitreomacular traction; MH Macular hole;  NVD neovascularization of the disc; NVE neovascularization elsewhere; AREDS age related eye disease study; ARMD age related macular degeneration; POAG primary open angle glaucoma; EBMD epithelial/anterior basement membrane dystrophy; ACIOL anterior chamber intraocular lens; IOL intraocular lens; PCIOL posterior chamber intraocular lens; Phaco/IOL phacoemulsification with intraocular lens placement; [place]  photorefractive keratectomy; LASIK laser assisted in situ keratomileusis; HTN hypertension; DM diabetes mellitus; COPD chronic obstructive pulmonary disease

## 2022-06-16 ENCOUNTER — Telehealth: Payer: Self-pay | Admitting: Internal Medicine

## 2022-06-16 NOTE — Telephone Encounter (Signed)
Patient is calling to schedule an appointment states she is having hemorrhoid bleeding and is wondering if there's anything we could do sooner than 01/24. Please advise

## 2022-06-16 NOTE — Telephone Encounter (Signed)
Pt stated that she has recently been having some Hemorrhoid bleeding and is requesting an appointment for Hemorrhoid banding:  Pt was scheduled for 07/23/2022 at 8:50 AM: Pt made aware:  Pt verbalized understanding with all questions answered.

## 2022-06-19 ENCOUNTER — Encounter (INDEPENDENT_AMBULATORY_CARE_PROVIDER_SITE_OTHER): Payer: Self-pay | Admitting: Ophthalmology

## 2022-06-19 ENCOUNTER — Ambulatory Visit (INDEPENDENT_AMBULATORY_CARE_PROVIDER_SITE_OTHER): Payer: Medicare Other | Admitting: Ophthalmology

## 2022-06-19 DIAGNOSIS — H35033 Hypertensive retinopathy, bilateral: Secondary | ICD-10-CM

## 2022-06-19 DIAGNOSIS — H04123 Dry eye syndrome of bilateral lacrimal glands: Secondary | ICD-10-CM

## 2022-06-19 DIAGNOSIS — Z961 Presence of intraocular lens: Secondary | ICD-10-CM

## 2022-06-19 DIAGNOSIS — H34832 Tributary (branch) retinal vein occlusion, left eye, with macular edema: Secondary | ICD-10-CM | POA: Diagnosis not present

## 2022-06-19 DIAGNOSIS — H40003 Preglaucoma, unspecified, bilateral: Secondary | ICD-10-CM

## 2022-06-19 DIAGNOSIS — I10 Essential (primary) hypertension: Secondary | ICD-10-CM | POA: Diagnosis not present

## 2022-06-19 DIAGNOSIS — H353131 Nonexudative age-related macular degeneration, bilateral, early dry stage: Secondary | ICD-10-CM

## 2022-06-19 MED ORDER — FARICIMAB-SVOA 6 MG/0.05ML IZ SOLN
6.0000 mg | INTRAVITREAL | Status: AC | PRN
  Administered 2022-06-19: 6 mg via INTRAVITREAL

## 2022-06-24 ENCOUNTER — Ambulatory Visit: Payer: Medicare Other | Admitting: Internal Medicine

## 2022-06-28 NOTE — Telephone Encounter (Signed)
Last Prolia inj 05/21/22 Next Prolia inj due 11/20/22

## 2022-06-30 ENCOUNTER — Other Ambulatory Visit (HOSPITAL_BASED_OUTPATIENT_CLINIC_OR_DEPARTMENT_OTHER): Payer: Self-pay

## 2022-06-30 DIAGNOSIS — I10 Essential (primary) hypertension: Secondary | ICD-10-CM

## 2022-06-30 MED ORDER — CLONIDINE HCL 0.1 MG PO TABS: 0.1000 mg | ORAL_TABLET | Freq: Two times a day (BID) | ORAL | 3 refills | Status: DC

## 2022-07-15 NOTE — Progress Notes (Shared)
[facility] Note  07/17/2022    CHIEF COMPLAINT Patient presents for No chief complaint on file.  HISTORY OF PRESENT ILLNESS: Janice Small is a 89 y.o. female who presents to the clinic today for:    Pt states   Referring physician: de [place], Blondell Reveal, MD [place],   [id number]  HISTORICAL INFORMATION:   Selected notes from the MEDICAL RECORD NUMBER Referred by Dr. Jerline Pain for eval of BRVO OS LEE: 10.12.2021 Ocular Hx- Glc suspect   CURRENT MEDICATIONS: Current Outpatient Medications (Ophthalmic Drugs)  Medication Sig   Bromfenac Sodium (PROLENSA) 0.07 % SOLN Place 1 drop into the left eye 4 (four) times daily.   prednisoLONE acetate (PRED FORTE) 1 % ophthalmic suspension INSTILL 1 DROP INTO LEFT EYE 4 TIMES A DAY   No current facility-administered medications for this visit. (Ophthalmic Drugs)   Current Outpatient Medications (Other)  Medication Sig   amLODipine (NORVASC) 5 MG tablet Take 1 tablet (5 mg total) by mouth daily.   CLOBETASOL PROPIONATE E 0.05 % emollient cream Apply topically 2 (two) times daily. (Patient not taking: Reported on 05/07/2022)   cloNIDine (CATAPRES) 0.1 MG tablet Take 1 tablet (0.1 mg total) by mouth 2 (two) times daily.   dapagliflozin propanediol (FARXIGA) 10 MG TABS tablet Take 1 tablet (10 mg total) by mouth daily.   furosemide (LASIX) 40 MG tablet Take 40 mg by mouth daily.   metoprolol succinate (TOPROL-XL) 25 MG 24 hr tablet Take 1 tablet (25 mg total) by mouth daily.   mupirocin ointment (BACTROBAN) 2 % Apply topically.   potassium chloride (KLOR-CON M) 10 MEQ tablet Take 1 tablet (10 mEq total) by mouth 2 (two) times daily. (Patient taking differently: Take 10 mEq by mouth in the morning.)   Rivaroxaban (XARELTO) 15 MG TABS tablet Take 1 tablet (15 mg total) by mouth daily with supper.   SYNTHROID 75 MCG tablet TAKE 1 TABLET BY MOUTH DAILY   VITAMIN D PO Take 2,000 Units by mouth daily  after lunch.   No current facility-administered medications for this visit. (Other)   REVIEW OF SYSTEMS:   ALLERGIES Allergies  Allergen Reactions   Codeine Phosphate Nausea And Vomiting   PAST MEDICAL HISTORY Past Medical History:  Diagnosis Date   AKI (acute kidney injury) ([place])    BREAST BIOPSY, HX OF 11/27/2006   CHF (congestive heart failure) ([place])    COLONIC POLYPS, HX OF 11/27/2006   HEMORRHOIDS 01/17/2010   HYPERTENSION 11/27/2006   Hypertensive retinopathy    OU   HYPOTHYROIDISM 11/27/2006   Macular degeneration    Dry OU   MENOPAUSAL SYNDROME 11/27/2006   Vascular disease    Past Surgical History:  Procedure Laterality Date   ABDOMINAL HYSTERECTOMY     APPENDECTOMY     BREAST EXCISIONAL BIOPSY Right 1987   BREAST SURGERY     bx   CARDIOVERSION N/A 04/16/2022   Procedure: CARDIOVERSION;  Surgeon: [name], MD;  [facility] END[facility]COPY;  Service: Cardiovascular;  Laterality: N/A;   CATARACT EXTRACTION Bilateral    EYE SURGERY Bilateral    Cat Sx OU   HEMORRHOID BANDING     TONSILLECTOMY     FAMILY HISTORY Family History  Problem Relation Age of Onset   Heart disease Father    Colon cancer Neg Hx    Esophageal cancer Neg Hx    Pancreatic cancer Neg Hx    Liver cancer Neg Hx  Stomach cancer Neg Hx    SOCIAL HISTORY Social History   Tobacco Use   Smoking status: Never   Smokeless tobacco: Never  Vaping Use   Vaping Use: Never used  Substance Use Topics   Alcohol use: Never    Comment: rarely   Drug use: No       OPHTHALMIC EXAM:  Not recorded    IMAGING AND PROCEDURES  Imaging and Procedures for 07/17/2022          ASSESSMENT/PLAN:    ICD-10-CM   1. Branch retinal vein occlusion of left eye with macular edema  H34.8320     2. Essential hypertension  I10     3. Hypertensive retinopathy of both eyes  H35.033     4. Early dry stage nonexudative age-related macular degeneration of both eyes  H35.3131     5.  Pseudophakia, both eyes  Z96.1     6. Glaucoma suspect of both eyes  H40.003     7. Dry eyes  H04.123      1. BRVO w/ CME OS             - S/P IVA OS #1 (10.18.21), #2 (11.15.21), #3 (12.13.21), #4 (01.10.22), #5 (02.07.22), #6 (03.07.22), #7 (04.04.22), #8 (5.2.22) -- IVA resistance  - S/P IVE OS #1 (05.31.22) -- sample, #2 (06.28.22), #3 (07.26.22), #4 (08.23.22), #5 (09.20.22), #6 (10.20.22), #7 (11.17.22), #8 (12.15.22), #9 (01.12.23), #10 (02.09.23), #11 (03.16.23), #12 (04.13.23), #13 (05.11.23), #14 (06.08.23), #15 (07.10.23), #16 (08.11.23),#17 (09.08.23), #18 (10.12.23), #19 (11.09.23)  - S/P IVV #1 (12.07.23 -- sample)             - S/P focal laser OS (02.16.22) - BCVA decreased to 20/40 from 20/30 - OCT shows OS: Persistent IRF/IRHM temporal macula and fovea -- slightly improved - reduced PF/Prolensa (for CME component) to BID OS due to steroid response -- pt states she has not been taking due to dealing with other health issues (A fib) - discussed IVE resistance and possible switch in medication - recommend Vabysmo OS #2 today, 01.02.23 - pt wishes to proceed - RBA of procedure discussed, questions answered - informed consent obtained and signed - see procedure note - Avastin informed consent obtained and signed, 10.18.21 (OS) - Eylea informed consent obtained and signed, 05.31.22 (OS) - Vabysmo informed consent obtained and signed, 12.07.23 (OS) - Good Days for Eylea # [id number], 07/14/2021 - 07/13/2022  - cont PF/Prolensa BID OS - F/U 4 weeks, DFE, OCT, possible injection   2,3. Hypertensive retinopathy OU - discussed importance of tight BP control - monitor  4. Age related macular degeneration, non-exudative, both eyes  - The incidence, anatomy, and pathology of dry AMD, risk of progression, and the AREDS and AREDS 2 study including smoking risks discussed with patient.   - recommend Amsler grid monitoring  5. Pseudophakia OU  - s/p CE/IOL  - IOL in good position,  doing well  - monitor  6. Glaucoma Suspect  - IOP 18,21  - discussed the possibility of starting a glaucoma drop in the future    - under the expert care of Dr. Jerline Pain  7. Dry eyes OU (OS>OD) - recommend artificial tears and lubricating ointment as needed  Ophthalmic Meds Ordered this visit:  No orders of the defined types were placed in this encounter.    No follow-ups on file.  There are no Patient Instructions on file for this visit.  This document serves as a record of services personally performed  by Gardiner Sleeper, MD, PhD. It was created on their behalf by San Jetty. Owens Shark, OA an ophthalmic technician. The creation of this record is the provider's dictation and/or activities during the visit.    Electronically signed by: San Jetty. Owens Shark, [place] 01.02.2024 8:01 AM .  Gardiner Sleeper, M.D., Ph.D. Diseases & Surgery of the Retina and Vitreous [facility] [place]    Abbreviations: M myopia (nearsighted); A astigmatism; H hyperopia (farsighted); P presbyopia; Mrx spectacle prescription;  CTL contact lenses; OD right eye; OS left eye; OU both eyes  XT exotropia; ET esotropia; PEK punctate epithelial keratitis; PEE punctate epithelial erosions; DES dry eye syndrome; MGD meibomian gland dysfunction; ATs artificial tears; PFAT's preservative free artificial tears; [place] nuclear sclerotic cataract; PSC posterior subcapsular cataract; ERM epi-retinal membrane; PVD posterior vitreous detachment; RD retinal detachment; DM diabetes mellitus; DR diabetic retinopathy; NPDR non-proliferative diabetic retinopathy; PDR proliferative diabetic retinopathy; CSME clinically significant macular edema; DME diabetic macular edema; dbh dot blot hemorrhages; CWS cotton wool spot; POAG primary open angle glaucoma; C/D cup-to-disc ratio; HVF humphrey visual field; GVF goldmann visual field; OCT optical coherence tomography; IOP intraocular pressure; BRVO Branch retinal vein occlusion; CRVO central  retinal vein occlusion; CRAO central retinal artery occlusion; BRAO branch retinal artery occlusion; RT retinal tear; SB scleral buckle; PPV pars plana vitrectomy; VH Vitreous hemorrhage; PRP panretinal laser photocoagulation; IVK intravitreal kenalog; VMT vitreomacular traction; MH Macular hole;  NVD neovascularization of the disc; NVE neovascularization elsewhere; AREDS age related eye disease study; ARMD age related macular degeneration; POAG primary open angle glaucoma; EBMD epithelial/anterior basement membrane dystrophy; ACIOL anterior chamber intraocular lens; IOL intraocular lens; PCIOL posterior chamber intraocular lens; Phaco/IOL phacoemulsification with intraocular lens placement; [place] photorefractive keratectomy; LASIK laser assisted in situ keratomileusis; HTN hypertension; DM diabetes mellitus; COPD chronic obstructive pulmonary disease

## 2022-07-15 NOTE — Progress Notes (Signed)
[facility] Note  07/17/2022    CHIEF COMPLAINT Patient presents for Retina Follow Up  HISTORY OF PRESENT ILLNESS: Janice Small is a 89 y.o. female who presents to the clinic today for:  HPI     Retina Follow Up   Patient presents with  CRVO/BRVO.  This started 4 weeks ago.  Duration of 4 weeks.  I, the attending physician,  performed the HPI with the patient and updated documentation appropriately.        Comments   4 week retina follow up BRVO OS and IVV OS pt is reporting no vision changes noticed she denies any flashes or floaters       Last edited by Bernarda Caffey, MD on 07/17/2022  1:24 PM.    Pt states VA seems the same.   Referring physician: [place], Blondell Reveal, MD [place],  [place] [id number]  HISTORICAL INFORMATION:   Selected notes from the MEDICAL RECORD NUMBER Referred by Dr. Jerline Pain for eval of BRVO OS LEE: 10.12.2021 Ocular Hx- Glc suspect   CURRENT MEDICATIONS: Current Outpatient Medications (Ophthalmic Drugs)  Medication Sig   Bromfenac Sodium (PROLENSA) 0.07 % SOLN Place 1 drop into the left eye 4 (four) times daily.   prednisoLONE acetate (PRED FORTE) 1 % ophthalmic suspension INSTILL 1 DROP INTO LEFT EYE 4 TIMES A DAY   No current facility-administered medications for this visit. (Ophthalmic Drugs)   Current Outpatient Medications (Other)  Medication Sig   amLODipine (NORVASC) 5 MG tablet Take 1 tablet (5 mg total) by mouth daily.   CLOBETASOL PROPIONATE E 0.05 % emollient cream Apply topically 2 (two) times daily.   cloNIDine (CATAPRES) 0.1 MG tablet Take 1 tablet (0.1 mg total) by mouth 2 (two) times daily.   dapagliflozin propanediol (FARXIGA) 10 MG TABS tablet Take 1 tablet (10 mg total) by mouth daily.   doxycycline (PERIOSTAT) 20 MG tablet Take 20 mg by mouth 2 (two) times daily.   furosemide (LASIX) 40 MG tablet Take 40 mg by mouth daily.   metoprolol succinate (TOPROL-XL) 25 MG 24 hr tablet  Take 1 tablet (25 mg total) by mouth daily.   mupirocin ointment (BACTROBAN) 2 % Apply topically.   potassium chloride (KLOR-CON M) 10 MEQ tablet Take 1 tablet (10 mEq total) by mouth 2 (two) times daily. (Patient taking differently: Take 10 mEq by mouth in the morning.)   Rivaroxaban (XARELTO) 15 MG TABS tablet Take 1 tablet (15 mg total) by mouth daily with supper.   SYNTHROID 75 MCG tablet TAKE 1 TABLET BY MOUTH DAILY   VITAMIN D PO Take 2,000 Units by mouth daily after lunch.   No current facility-administered medications for this visit. (Other)   REVIEW OF SYSTEMS: ROS   Positive for: Gastrointestinal, Musculoskeletal, Eyes Negative for: Constitutional, Neurological, Skin, Genitourinary, HENT, Endocrine, Cardiovascular, Respiratory, Psychiatric, Allergic/Imm, Heme/Lymph Last edited by Kingsley Spittle, COT on 07/17/2022 10:48 AM.      ALLERGIES Allergies  Allergen Reactions   Codeine Phosphate Nausea And Vomiting   PAST MEDICAL HISTORY Past Medical History:  Diagnosis Date   AKI (acute kidney injury) ([place])    BREAST BIOPSY, HX OF 11/27/2006   CHF (congestive heart failure) ([place])    COLONIC POLYPS, HX OF 11/27/2006   HEMORRHOIDS 01/17/2010   HYPERTENSION 11/27/2006   Hypertensive retinopathy    OU   HYPOTHYROIDISM 11/27/2006   Macular degeneration    Dry OU   MENOPAUSAL SYNDROME 11/27/2006  Vascular disease    Past Surgical History:  Procedure Laterality Date   ABDOMINAL HYSTERECTOMY     APPENDECTOMY     BREAST EXCISIONAL BIOPSY Right 1987   BREAST SURGERY     bx   CARDIOVERSION N/A 04/16/2022   Procedure: CARDIOVERSION;  Surgeon: [name], MD;  Location: [facility];  Service: Cardiovascular;  Laterality: N/A;   CATARACT EXTRACTION Bilateral    EYE SURGERY Bilateral    Cat Sx OU   HEMORRHOID BANDING     TONSILLECTOMY     FAMILY HISTORY Family History  Problem Relation Age of Onset   Heart disease Father    Colon cancer Neg Hx    Esophageal  cancer Neg Hx    Pancreatic cancer Neg Hx    Liver cancer Neg Hx    Stomach cancer Neg Hx    SOCIAL HISTORY Social History   Tobacco Use   Smoking status: Never   Smokeless tobacco: Never  Vaping Use   Vaping Use: Never used  Substance Use Topics   Alcohol use: Never    Comment: rarely   Drug use: No       OPHTHALMIC EXAM:  Base Eye Exam     Visual Acuity (Snellen - Linear)       Right Left   Dist cc 20/30 20/40   Dist ph cc 20/25 -1 20/30 -1    Correction: Glasses         Tonometry (Tonopen, 9:29 AM)       Right Left   Pressure 19 22         Pupils       Pupils Dark Light Shape React APD   Right PERRL 3 2 Round Sluggish None   Left PERRL 3 2 Round Sluggish None         Visual Fields       Left Right    Full Full         Extraocular Movement       Right Left    Full, Ortho Full, Ortho         Neuro/Psych     Oriented x3: Yes   Mood/Affect: Normal         Dilation     Both eyes: 2.5% Phenylephrine @ 9:29 AM           Slit Lamp and Fundus Exam     Slit Lamp Exam       Right Left   Lids/Lashes Dermatochalasis - upper lid Dermatochalasis - upper lid, mild Meibomian gland dysfunction   Conjunctiva/Sclera White and quiet White and quiet   Cornea 2+ inferior Punctate epithelial erosions, well healed temporal cataract wounds, arcus, early band K nasal and temporal, tear film debris 3+ inferior Punctate epithelial erosions, irregular epi, well healed temporal cataract wounds, arcus, tear film debris   Anterior Chamber Deep and quiet Deep and quiet   Iris Round and dilated Round and dilated   Lens 3 piece PC IOL in good position with open PC 3 piece PC IOL in good position, 1+PCO (non-central)   Anterior Vitreous Vitreous syneresis Vitreous syneresis, Posterior vitreous detachment         Fundus Exam       Right Left   Disc Pink and Sharp, Compact mild Pallor, Sharp rim, temporal PPA, Compact   C/D Ratio 0.5 0.4   Macula  Flat, Blunted foveal reflex, mild Drusen, RPE mottling and clumping, no heme or edema Blunted foveal reflex, focal  IRH and exudates temporal macula -- slightly improved, cystic changes and edema -- slightly improved, +focal laser changes, Drusen   Vessels attenuated, Tortuous attenuated, Tortuous   Periphery Attached, No heme Attached, mild reticular degeneration           Refraction     Wearing Rx       Sphere Cylinder Axis Add   Right -1.00 +0.50 027 +2.50   Left -0.75 +0.75 083 +2.50    Type: progressive           IMAGING AND PROCEDURES  Imaging and Procedures for 07/17/2022  OCT, Retina - OU - Both Eyes       Right Eye Quality was good. Central Foveal Thickness: 267. Progression has been stable. Findings include normal foveal contour, no IRF, no SRF, retinal drusen .   Left Eye Quality was good. Central Foveal Thickness: 278. Progression has been stable. Findings include normal foveal contour, no SRF, retinal drusen , intraretinal hyper-reflective material, intraretinal fluid (Persistent IRF/IRHM temporal macula and fovea -- slightly improved?).   Notes *Images captured and stored on drive  Diagnosis / Impression:  OD: non-exu ARMD w/ fine drusen OS: BRVO w/ CME -- Persistent IRF/IRHM temporal macula and fovea -- slightly improved?  Clinical management:  See below  Abbreviations: NFP - Normal foveal profile. CME - cystoid macular edema. PED - pigment epithelial detachment. IRF - intraretinal fluid. SRF - subretinal fluid. EZ - ellipsoid zone. ERM - epiretinal membrane. ORA - outer retinal atrophy. ORT - outer retinal tubulation. SRHM - subretinal hyper-reflective material. IRHM - intraretinal hyper-reflective material      Intravitreal Injection, Pharmacologic Agent - OS - Left Eye       Time Out 07/17/2022. 10:44 AM. Confirmed correct patient, procedure, site, and patient consented.   Anesthesia Topical anesthesia was used. Anesthetic medications included  Lidocaine 2%, Proparacaine 0.5%.   Procedure Preparation included 5% betadine to ocular surface, eyelid speculum. A (32g) needle was used.   Injection: 6 mg faricimab-svoa 6 MG/0.05ML   Route: Intravitreal, Site: Left Eye   NDC: 82500-370-48, Lot: [id number], Expiration date: 01/11/2024, Waste: 0 mL   Post-op Post injection exam found visual acuity of at least counting fingers. The patient tolerated the procedure well. There were no complications. The patient received written and verbal post procedure care education. Post injection medications were not given.   Notes **SAMPLE MEDICATION ADMINISTERED**           ASSESSMENT/PLAN:    ICD-10-CM   1. Branch retinal vein occlusion of left eye with macular edema  H34.8320 OCT, Retina - OU - Both Eyes    Intravitreal Injection, Pharmacologic Agent - OS - Left Eye    faricimab-svoa (VABYSMO) '6mg'$ /0.31m intravitreal injection    2. Essential hypertension  I10     3. Hypertensive retinopathy of both eyes  H35.033     4. Early dry stage nonexudative age-related macular degeneration of both eyes  H35.3131     5. Pseudophakia, both eyes  Z96.1     6. Glaucoma suspect of both eyes  H40.003     7. Dry eyes  H04.123      1. BRVO w/ CME OS             - S/P IVA OS #1 (10.18.21), #2 (11.15.21), #3 (12.13.21), #4 (01.10.22), #5 (02.07.22), #6 (03.07.22), #7 (04.04.22), #8 (5.2.22) -- IVA resistance  - S/P IVE OS #1 (05.31.22) -- sample, #2 (06.28.22), #3 (07.26.22), #4 (08.23.22), #5 (09.20.22), #6 (10.20.22), #7 (  11.17.22), #8 (12.15.22), #9 (01.12.23), #10 (02.09.23), #11 (03.16.23), #12 (04.13.23), #13 (05.11.23), #14 (06.08.23), #15 (07.10.23), #16 (08.11.23),#17 (09.08.23), #18 (10.12.23), #19 (11.09.23) -- IVE resistance  - S/P IVV OS #1 (12.07.23 -- sample)             - S/P focal laser OS (02.16.22) - BCVA 20/30 from 20/40 - OCT shows OS: Persistent IRF/IRHM temporal macula and fovea -- ?slightly improved - reduced PF/Prolensa (for  CME component) to BID OS due to steroid response -- pt states she has not been taking due to dealing with other health issues (A fib) - recommend IVV OS #2 SAMPLE today, 01.04.23 -- insurance auth still pending - pt wishes to proceed - RBA of procedure discussed, questions answered - informed consent obtained and signed - see procedure note - Avastin informed consent obtained and signed, 10.18.21 (OS) - Eylea informed consent obtained and signed, 05.31.22 (OS) - Vabysmo informed consent obtained and signed, 12.07.23 (OS) - Good Days for Eylea # [id number], 07/14/2021 - 07/13/2022  - cont PF/Prolensa BID OS - F/U 4 weeks, DFE, OCT, possible injection   2,3. Hypertensive retinopathy OU - discussed importance of tight BP control - monitor  4. Age related macular degeneration, non-exudative, both eyes  - The incidence, anatomy, and pathology of dry AMD, risk of progression, and the AREDS and AREDS 2 study including smoking risks discussed with patient.   - recommend Amsler grid monitoring  5. Pseudophakia OU  - s/p CE/IOL OU  - IOL in good position, doing well  - monitor  6. Glaucoma Suspect  - IOP 19,22  - discussed the possibility of starting a glaucoma drop in the future    - under the expert care of Dr. Jerline Pain  7. Dry eyes OU (OS>OD) - recommend artificial tears and lubricating ointment as needed  Ophthalmic Meds Ordered this visit:  Meds ordered this encounter  Medications   faricimab-svoa (VABYSMO) '6mg'$ /0.69m intravitreal injection     Return in about 4 weeks (around 08/14/2022) for BRVO OS, DFE, OCT, likely injection.  There are no Patient Instructions on file for this visit.  This document serves as a record of services personally performed by BGardiner Sleeper MD, PhD. It was created on their behalf by ASan Jetty BOwens Shark OA an ophthalmic technician. The creation of this record is the provider's dictation and/or activities during the visit.    Electronically signed by: ASan Jetty BOwens Shark [place]01.02.2023 1:26 PM  BGardiner Sleeper M.D., Ph.D. Diseases & Surgery of the Retina and Vitreous [facility] I have reviewed the above documentation for accuracy and completeness, and I agree with the above. BGardiner Sleeper M.D., Ph.D. 07/17/22 1:27 PM  Abbreviations: M myopia (nearsighted); A astigmatism; H hyperopia (farsighted); P presbyopia; Mrx spectacle prescription;  CTL contact lenses; OD right eye; OS left eye; OU both eyes  XT exotropia; ET esotropia; PEK punctate epithelial keratitis; PEE punctate epithelial erosions; DES dry eye syndrome; MGD meibomian gland dysfunction; ATs artificial tears; PFAT's preservative free artificial tears; [place]ira Beachnuclear sclerotic cataract; PSC posterior subcapsular cataract; ERM epi-retinal membrane; PVD posterior vitreous detachment; RD retinal detachment; DM diabetes mellitus; DR diabetic retinopathy; NPDR non-proliferative diabetic retinopathy; PDR proliferative diabetic retinopathy; CSME clinically significant macular edema; DME diabetic macular edema; dbh dot blot hemorrhages; CWS cotton wool spot; POAG primary open angle glaucoma; C/D cup-to-disc ratio; HVF humphrey visual field; GVF goldmann visual field; OCT optical coherence tomography; IOP intraocular pressure; BRVO Branch retinal vein occlusion;  CRVO central retinal vein occlusion; CRAO central retinal artery occlusion; BRAO branch retinal artery occlusion; RT retinal tear; SB scleral buckle; PPV pars plana vitrectomy; VH Vitreous hemorrhage; PRP panretinal laser photocoagulation; IVK intravitreal kenalog; VMT vitreomacular traction; MH Macular hole;  NVD neovascularization of the disc; NVE neovascularization elsewhere; AREDS age related eye disease study; ARMD age related macular degeneration; POAG primary open angle glaucoma; EBMD epithelial/anterior basement membrane dystrophy; ACIOL anterior chamber intraocular lens; IOL intraocular lens; PCIOL posterior chamber  intraocular lens; Phaco/IOL phacoemulsification with intraocular lens placement; [place] photorefractive keratectomy; LASIK laser assisted in situ keratomileusis; HTN hypertension; DM diabetes mellitus; COPD chronic obstructive pulmonary disease

## 2022-07-16 ENCOUNTER — Encounter (HOSPITAL_BASED_OUTPATIENT_CLINIC_OR_DEPARTMENT_OTHER): Payer: Self-pay | Admitting: Family Medicine

## 2022-07-16 ENCOUNTER — Ambulatory Visit (HOSPITAL_BASED_OUTPATIENT_CLINIC_OR_DEPARTMENT_OTHER): Payer: Medicare Other | Admitting: Family Medicine

## 2022-07-16 VITALS — BP 156/78 | HR 58 | Ht 60.0 in | Wt 130.0 lb

## 2022-07-16 DIAGNOSIS — J069 Acute upper respiratory infection, unspecified: Secondary | ICD-10-CM

## 2022-07-16 NOTE — Progress Notes (Signed)
    Procedures performed today:    None.  Independent interpretation of notes and tests performed by another provider:   None.  Brief History, Exam, Impression, and Recommendations:    BP (!) 156/78 (BP Location: Right Arm, Patient Position: Sitting, Cuff Size: Normal)   Pulse (!) 58   Ht 5' (1.524 m)   Wt 130 lb (59 kg)   SpO2 97%   BMI 25.39 kg/m   URI (upper respiratory infection) Patient reports that about 1 week ago, she began to experience symptoms of cough, throat pain, fatigue.  She denies experiencing any fever, chills, sweats, body aches.  She is not [place] aware of any sick contacts, does report that her family had a "tickle in her throat", but no other obvious illness among family members that she saw recently.  Primary concern today is overall fatigue and lack of energy.  She does feel that symptoms have been improving over the past couple days.  Has tried some over-the-counter medications.  Tried Mucinex, however this did upset her stomach and so she has not been using this as much.  She has not noticed any significant shortness of breath or trouble breathing. On exam, patient in no acute distress, vital signs stable, patient is afebrile.  Cardiovascular exam with regular rate and rhythm, lungs clear to auscultation bilaterally.  Bilateral external auditory canals are clear, normal-appearing tympanic membranes, no bulging noted. Suspect that current symptoms are related to acute viral illness.  Do not suspect current bacterial infection, do not suspect pneumonia given normal pulmonary exam and history as well as clinical appearance.  Discussed general recommendations related to management of current symptoms.  Discussed possibility of utilizing Tessalon Perles to help with cough, patient declines today.  Discussed consideration of chest x-ray, however will hold off for now given normal pulmonary exam and that symptoms have generally been improving. Discussed expectations for  continued gradual improvement in symptoms over the coming days.  Discussed may take some time for fatigue and energy levels to return to baseline.  Did discuss that if symptoms do worsen, patient develops fever, recommend returning to the office or emergency department for further evaluation  Return if symptoms worsen or fail to improve.  Patient has appointment scheduled in February for follow-up   ___________________________________________ Amillion Scobee de [place], MD, ABFM, [facility] [place]

## 2022-07-16 NOTE — Assessment & Plan Note (Signed)
Patient reports that about 1 week ago, she began to experience symptoms of cough, throat pain, fatigue.  She denies experiencing any fever, chills, sweats, body aches.  She is not [place] aware of any sick contacts, does report that her family had a "tickle in her throat", but no other obvious illness among family members that she saw recently.  Primary concern today is overall fatigue and lack of energy.  She does feel that symptoms have been improving over the past couple days.  Has tried some over-the-counter medications.  Tried Mucinex, however this did upset her stomach and so she has not been using this as much.  She has not noticed any significant shortness of breath or trouble breathing. On exam, patient in no acute distress, vital signs stable, patient is afebrile.  Cardiovascular exam with regular rate and rhythm, lungs clear to auscultation bilaterally.  Bilateral external auditory canals are clear, normal-appearing tympanic membranes, no bulging noted. Suspect that current symptoms are related to acute viral illness.  Do not suspect current bacterial infection, do not suspect pneumonia given normal pulmonary exam and history as well as clinical appearance.  Discussed general recommendations related to management of current symptoms.  Discussed possibility of utilizing Tessalon Perles to help with cough, patient declines today.  Discussed consideration of chest x-ray, however will hold off for now given normal pulmonary exam and that symptoms have generally been improving. Discussed expectations for continued gradual improvement in symptoms over the coming days.  Discussed may take some time for fatigue and energy levels to return to baseline.  Did discuss that if symptoms do worsen, patient develops fever, recommend returning to the office or emergency department for further evaluation

## 2022-07-16 NOTE — Patient Instructions (Signed)

## 2022-07-17 ENCOUNTER — Encounter (INDEPENDENT_AMBULATORY_CARE_PROVIDER_SITE_OTHER): Payer: Medicare Other | Admitting: Ophthalmology

## 2022-07-17 ENCOUNTER — Encounter (INDEPENDENT_AMBULATORY_CARE_PROVIDER_SITE_OTHER): Payer: Self-pay | Admitting: Ophthalmology

## 2022-07-17 ENCOUNTER — Ambulatory Visit (INDEPENDENT_AMBULATORY_CARE_PROVIDER_SITE_OTHER): Payer: Medicare Other | Admitting: Ophthalmology

## 2022-07-17 DIAGNOSIS — H04123 Dry eye syndrome of bilateral lacrimal glands: Secondary | ICD-10-CM

## 2022-07-17 DIAGNOSIS — H40003 Preglaucoma, unspecified, bilateral: Secondary | ICD-10-CM

## 2022-07-17 DIAGNOSIS — I10 Essential (primary) hypertension: Secondary | ICD-10-CM | POA: Diagnosis not present

## 2022-07-17 DIAGNOSIS — Z961 Presence of intraocular lens: Secondary | ICD-10-CM

## 2022-07-17 DIAGNOSIS — H353131 Nonexudative age-related macular degeneration, bilateral, early dry stage: Secondary | ICD-10-CM

## 2022-07-17 DIAGNOSIS — H34832 Tributary (branch) retinal vein occlusion, left eye, with macular edema: Secondary | ICD-10-CM | POA: Diagnosis not present

## 2022-07-17 DIAGNOSIS — H35033 Hypertensive retinopathy, bilateral: Secondary | ICD-10-CM

## 2022-07-17 MED ORDER — FARICIMAB-SVOA 6 MG/0.05ML IZ SOLN
6.0000 mg | INTRAVITREAL | Status: AC | PRN
  Administered 2022-07-17: 6 mg via INTRAVITREAL

## 2022-07-23 ENCOUNTER — Encounter: Payer: Medicare Other | Admitting: Internal Medicine

## 2022-07-28 NOTE — Progress Notes (Signed)
Cardiology Office Note:    Date:  07/31/2022   ID:  Lorilyn Laitinen, DOB 1927-09-08, MRN [id number]  PCP:  de [place], Raymond J, MD  Cardiologist:  Donato Heinz, MD  Electrophysiologist:  None   Referring MD: de [place], Raymond J, MD   Chief Complaint  Patient presents with   Atrial Fibrillation    History of Present Illness:    Janice Small is a 89 y.o. female with a hx of chronic diastolic heart failure, hypertension, hypothyroidism who presents for follow-up.  She was referred by Dr. de [place] for evaluation of atrial fibrillation, initially seen on 01/09/2022.  Noted to be in A-fib at PCP clinic 12/23/2021.  Started on Xarelto 20 mg daily.  She reports that she has been having significant lower extremity, states that she has been putting bandages on her legs because of all the fluid weeping from her legs.  She also reports has been feeling short of breath.  Occurs with minimal exertion such as walking around her house.  Denies any chest pain.  Denies any lightheadedness or syncope.  No bleeding issues on Xarelto.  No smoking history.  Family history includes father had MI in 60s.  At initial clinic visit on 01/09/2022 was noted to be significantly volume overloaded.  She was sent to the ED and admitted for IV diuresis.  Echocardiogram 01/10/2022 showed EF 60 to 65%, normal RV function, moderate left atrial enlargement, mild to moderate mitral regurgitation.  She responded well to IV diuresis and was discharged on Lasix 40 mg daily along with Farxiga 10 mg daily.  Underwent successful cardioversion for A-fib on 04/16/2022.  Since last clinic visit, she reports she is doing okay.  Denies any chest pain, dyspnea, lightheadedness, syncope, or palpitations.  Reports wound has healed on legs.  Reports lower extremity edema has improved, she has been wearing compression stockings most days.  She is taking Xarelto, denies any bleeding issues.   Wt Readings from Last 3 Encounters:  07/31/22  133 lb 12.8 oz (60.7 kg)  07/16/22 130 lb (59 kg)  05/07/22 134 lb 9.6 oz (61.1 kg)     Past Medical History:  Diagnosis Date   AKI (acute kidney injury) ([place])    BREAST BIOPSY, HX OF 11/27/2006   CHF (congestive heart failure) ([place])    COLONIC POLYPS, HX OF 11/27/2006   HEMORRHOIDS 01/17/2010   HYPERTENSION 11/27/2006   Hypertensive retinopathy    OU   HYPOTHYROIDISM 11/27/2006   Macular degeneration    Dry OU   MENOPAUSAL SYNDROME 11/27/2006   Vascular disease     Past Surgical History:  Procedure Laterality Date   ABDOMINAL HYSTERECTOMY     APPENDECTOMY     BREAST EXCISIONAL BIOPSY Right 1987   BREAST SURGERY     bx   CARDIOVERSION N/A 04/16/2022   Procedure: CARDIOVERSION;  Surgeon: [name], MD;  Location: [facility];  Service: Cardiovascular;  Laterality: N/A;   CATARACT EXTRACTION Bilateral    EYE SURGERY Bilateral    Cat Sx OU   HEMORRHOID BANDING     TONSILLECTOMY      Current Medications: Current Meds  Medication Sig   Bromfenac Sodium (PROLENSA) 0.07 % SOLN Place 1 drop into the left eye 4 (four) times daily.   CLOBETASOL PROPIONATE E 0.05 % emollient cream Apply topically 2 (two) times daily.   doxycycline (PERIOSTAT) 20 MG tablet Take 20 mg by mouth 2 (two) times daily.   mupirocin ointment (BACTROBAN) 2 % Apply  topically.   prednisoLONE acetate (PRED FORTE) 1 % ophthalmic suspension INSTILL 1 DROP INTO LEFT EYE 4 TIMES A DAY   SYNTHROID 75 MCG tablet TAKE 1 TABLET BY MOUTH DAILY   VITAMIN D PO Take 2,000 Units by mouth daily after lunch.   [DISCONTINUED] amLODipine (NORVASC) 5 MG tablet Take 1 tablet (5 mg total) by mouth daily.   [DISCONTINUED] cloNIDine (CATAPRES) 0.1 MG tablet Take 1 tablet (0.1 mg total) by mouth 2 (two) times daily.   [DISCONTINUED] dapagliflozin propanediol (FARXIGA) 10 MG TABS tablet Take 1 tablet (10 mg total) by mouth daily.   [DISCONTINUED] furosemide (LASIX) 40 MG tablet Take 40 mg by mouth daily.    [DISCONTINUED] metoprolol succinate (TOPROL-XL) 25 MG 24 hr tablet Take 1 tablet (25 mg total) by mouth daily.   [DISCONTINUED] potassium chloride (KLOR-CON M) 10 MEQ tablet Take 1 tablet (10 mEq total) by mouth 2 (two) times daily. (Patient taking differently: Take 10 mEq by mouth in the morning.)   [DISCONTINUED] Rivaroxaban (XARELTO) 15 MG TABS tablet Take 1 tablet (15 mg total) by mouth daily with supper.     Allergies:   Codeine phosphate   Social History   Socioeconomic History   Marital status: Widowed    Spouse name: Not on file   Number of children: Not on file   Years of education: Not on file   Highest education level: Not on file  Occupational History   Not on file  Tobacco Use   Smoking status: Never   Smokeless tobacco: Never  Vaping Use   Vaping Use: Never used  Substance and Sexual Activity   Alcohol use: Never    Comment: rarely   Drug use: No   Sexual activity: Not Currently  Other Topics Concern   Not on file  Social History Narrative   The patient is retired she has been in [place] for more than 50 years she has 3 sons that she is widowed.   Never tobacco, no alcohol, 2 caffeinated beverages daily no drugs   Social Determinants of Health   Financial Resource Strain: Not on file  Food Insecurity: Not on file  Transportation Needs: Not on file  Physical Activity: Not on file  Stress: Not on file  Social Connections: Not on file     Family History: The patient's family history includes Heart disease in her father. There is no history of Colon cancer, Esophageal cancer, Pancreatic cancer, Liver cancer, or Stomach cancer.  ROS:   Please see the history of present illness.     All other systems reviewed and are negative.  EKGs/Labs/Other Studies Reviewed:    The following studies were reviewed today:   EKG:   01/09/22: Afib, rate 93, low voltage, Q wave in V1/2 01/23/22: Atrial fibrillation, rate 89, low voltage, nonspecific T wave  flattening 05/07/22: Change in P wave axis from prior, suspect ectopic atrial rhythm, voltage, nonspecific T wave flattening, rate 68 07/31/22: Afib, rate 65  Recent Labs: 12/23/2021: TSH 2.720 01/09/2022: ALT 43 02/13/2022: NT-Pro BNP 2,289 04/16/2022: Hemoglobin 12.2; Platelets 138 05/07/2022: BNP 475.2; BUN 33; Creatinine, Ser 1.24; Magnesium 2.3; Potassium 4.2; Sodium 139  Recent Lipid Panel    Component Value Date/Time   CHOL 167 11/04/2021 1144   TRIG 58 11/04/2021 1144   HDL 86 11/04/2021 1144   CHOLHDL 1.9 11/04/2021 1144   CHOLHDL 2 12/03/2018 1010   VLDL 18.6 12/03/2018 1010   LDLCALC 69 11/04/2021 1144   LDLDIRECT 104.0 06/17/2013 1046  Physical Exam:    VS:  BP (!) 136/56 (BP Location: Left Arm, Patient Position: Sitting, Cuff Size: Normal)   Pulse 65   Ht 5' (1.524 m)   Wt 133 lb 12.8 oz (60.7 kg)   SpO2 99%   BMI 26.13 kg/m     Wt Readings from Last 3 Encounters:  07/31/22 133 lb 12.8 oz (60.7 kg)  07/16/22 130 lb (59 kg)  05/07/22 134 lb 9.6 oz (61.1 kg)     GEN:  Well nourished, well developed in no acute distress HEENT: Normal NECK: No JVD CARDIAC: Irregular, normal rate no murmurs, rubs, gallops RESPIRATORY:  Clear to auscultation without rales, wheezing or rhonchi  ABDOMEN: Soft, non-tender, non-distended MUSCULOSKELETAL:  trace edema SKIN: Warm and dry NEUROLOGIC:  Alert and oriented x 3 PSYCHIATRIC:  Normal affect   ASSESSMENT:    1. Chronic diastolic heart failure ([place])   2. Atrial fibrillation, unspecified type ([place])   3. Essential hypertension   4. Persistent atrial fibrillation ([facility])       PLAN:    Chronic diastolic heart failure: At initial clinic visit on 01/09/2022 was noted to be significantly volume overloaded.  She was sent to the ED and admitted for IV diuresis.  Echocardiogram 01/10/2022 showed EF 60 to 65%, normal RV function, moderate left atrial enlargement, mild to moderate mitral regurgitation.  She responded well to IV  diuresis and was discharged on Lasix 40 mg daily along with Farxiga 10 mg daily. -Continue Lasix 40 mg daily and continue Farxiga 10 mg daily.  Check BMET/magnesium/  Atrial fibrillation: New diagnosis 12/23/2021.  Underwent successful cardioversion on 04/16/2022, but now back in Afib -Continue Xarelto -Continue Toprol-XL 25 mg daily, appears rate controlled -She was having bleeding from hemorrhoids so this was deferred, she underwent hemorrhoid banding and reports resolution.  Since she reported no further bleeding, underwent cardioversion on 04/16/2022.  She is now back in A-fib in clinic today.  Appears asymptomatic and rates well-controlled.  Given age and asymptomatic continue rate control strategy  Hypertension: On clonidine 0.1 mg twice daily, amlodipine 5 mg daily, Lasix 40 mg daily, Toprol-XL 25 mg daily.  Previously on lisinopril 40 mg daily, was discontinued during admission 12/2021 due to AKI.   -BP appears controlled.  Discussed switching from amlodipine to alternative antihypertensive as concerned that amlodipine could be contributing to her lower extremity edema but she declines  Hypothyroidism: On levothyroxine 75 mcg daily.  TSH 2.72 on 12/23/2021   RTC in 3 months   Medication Adjustments/Labs and Tests Ordered: Current medicines are reviewed at length with the patient today.  Concerns regarding medicines are outlined above.  Orders Placed This Encounter  Procedures   Basic metabolic panel   Magnesium   EKG 12-Lead   Meds ordered this encounter  Medications   amLODipine (NORVASC) 5 MG tablet    Sig: Take 1 tablet (5 mg total) by mouth daily.    Dispense:  90 tablet    Refill:  3   cloNIDine (CATAPRES) 0.1 MG tablet    Sig: Take 1 tablet (0.1 mg total) by mouth 2 (two) times daily.    Dispense:  180 tablet    Refill:  3    Requesting 1 year supply   dapagliflozin propanediol (FARXIGA) 10 MG TABS tablet    Sig: Take 1 tablet (10 mg total) by mouth daily.    Dispense:   90 tablet    Refill:  3   furosemide (LASIX) 40 MG tablet  Sig: Take 1 tablet (40 mg total) by mouth daily.    Dispense:  90 tablet    Refill:  3   metoprolol succinate (TOPROL-XL) 25 MG 24 hr tablet    Sig: Take 1 tablet (25 mg total) by mouth daily.    Dispense:  90 tablet    Refill:  3   potassium chloride (KLOR-CON M) 10 MEQ tablet    Sig: Take 1 tablet (10 mEq total) by mouth daily.    Dispense:  90 tablet    Refill:  3   Rivaroxaban (XARELTO) 15 MG TABS tablet    Sig: Take 1 tablet (15 mg total) by mouth daily with supper.    Dispense:  90 tablet    Refill:  1    Patient Instructions  Medication Instructions:  No changes *If you need a refill on your cardiac medications before your next appointment, please call your pharmacy*   Lab Work: BMET and Magnesium today If you have labs (blood work) drawn today and your tests are completely normal, you will receive your results only by: [place] (if you have MyChart) OR A paper copy in the mail If you have any lab test that is abnormal or we need to change your treatment, we will call you to review the results.   Follow-Up: At [facility], you and your health needs are our priority.  As part of our continuing mission to provide you with exceptional heart care, we have created designated Provider Care Teams.  These Care Teams include your primary Cardiologist (physician) and Advanced Practice Providers (APPs -  Physician Assistants and Nurse Practitioners) who all work together to provide you with the care you need, when you need it.  We recommend signing up for the patient portal called "MyChart".  Sign up information is provided on this After Visit Summary.  MyChart is used to connect with patients for Virtual Visits (Telemedicine).  Patients are able to view lab/test results, encounter notes, upcoming appointments, etc.  Non-urgent messages can be sent to your provider as well.   To learn more about what you  can do with MyChart, go to [email or web address].    Your next appointment:   3 month(s)  Provider:   Donato Heinz, MD       Signed, Donato Heinz, MD  07/31/2022 5:42 PM    [facility]

## 2022-07-31 ENCOUNTER — Ambulatory Visit: Payer: Medicare Other | Attending: Cardiology | Admitting: Cardiology

## 2022-07-31 ENCOUNTER — Encounter: Payer: Self-pay | Admitting: Cardiology

## 2022-07-31 VITALS — BP 136/56 | HR 65 | Ht 60.0 in | Wt 133.8 lb

## 2022-07-31 DIAGNOSIS — I5032 Chronic diastolic (congestive) heart failure: Secondary | ICD-10-CM

## 2022-07-31 DIAGNOSIS — I10 Essential (primary) hypertension: Secondary | ICD-10-CM | POA: Diagnosis not present

## 2022-07-31 DIAGNOSIS — I4819 Other persistent atrial fibrillation: Secondary | ICD-10-CM

## 2022-07-31 MED ORDER — FUROSEMIDE 40 MG PO TABS: 40.0000 mg | ORAL_TABLET | Freq: Every day | ORAL | 3 refills | Status: DC

## 2022-07-31 MED ORDER — POTASSIUM CHLORIDE CRYS ER 10 MEQ PO TBCR: 10.0000 meq | EXTENDED_RELEASE_TABLET | Freq: Every day | ORAL | 3 refills | Status: DC

## 2022-07-31 MED ORDER — METOPROLOL SUCCINATE ER 25 MG PO TB24: 25.0000 mg | ORAL_TABLET | Freq: Every day | ORAL | 3 refills | Status: DC

## 2022-07-31 MED ORDER — AMLODIPINE BESYLATE 5 MG PO TABS: 5.0000 mg | ORAL_TABLET | Freq: Every day | ORAL | 3 refills | Status: DC

## 2022-07-31 MED ORDER — CLONIDINE HCL 0.1 MG PO TABS: 0.1000 mg | ORAL_TABLET | Freq: Two times a day (BID) | ORAL | 3 refills | Status: DC

## 2022-07-31 MED ORDER — RIVAROXABAN 15 MG PO TABS: 15.0000 mg | ORAL_TABLET | Freq: Every day | ORAL | 1 refills | Status: DC

## 2022-07-31 MED ORDER — DAPAGLIFLOZIN PROPANEDIOL 10 MG PO TABS: 10.0000 mg | ORAL_TABLET | Freq: Every day | ORAL | 3 refills | Status: DC

## 2022-07-31 NOTE — Patient Instructions (Signed)
Medication Instructions:  No changes *If you need a refill on your cardiac medications before your next appointment, please call your pharmacy*   Lab Work: BMET and Magnesium today If you have labs (blood work) drawn today and your tests are completely normal, you will receive your results only by: [place] (if you have MyChart) OR A paper copy in the mail If you have any lab test that is abnormal or we need to change your treatment, we will call you to review the results.   Follow-Up: At [facility], you and your health needs are our priority.  As part of our continuing mission to provide you with exceptional heart care, we have created designated Provider Care Teams.  These Care Teams include your primary Cardiologist (physician) and Advanced Practice Providers (APPs -  Physician Assistants and Nurse Practitioners) who all work together to provide you with the care you need, when you need it.  We recommend signing up for the patient portal called "MyChart".  Sign up information is provided on this After Visit Summary.  MyChart is used to connect with patients for Virtual Visits (Telemedicine).  Patients are able to view lab/test results, encounter notes, upcoming appointments, etc.  Non-urgent messages can be sent to your provider as well.   To learn more about what you can do with MyChart, go to [email or web address].    Your next appointment:   3 month(s)  Provider:   Donato Heinz, MD

## 2022-08-01 ENCOUNTER — Encounter: Payer: Self-pay | Admitting: *Deleted

## 2022-08-01 ENCOUNTER — Encounter: Payer: Self-pay | Admitting: Internal Medicine

## 2022-08-01 ENCOUNTER — Ambulatory Visit (INDEPENDENT_AMBULATORY_CARE_PROVIDER_SITE_OTHER): Payer: Medicare Other | Admitting: Internal Medicine

## 2022-08-01 VITALS — BP 118/76 | HR 85 | Ht 60.0 in | Wt 133.6 lb

## 2022-08-01 DIAGNOSIS — E039 Hypothyroidism, unspecified: Secondary | ICD-10-CM

## 2022-08-01 DIAGNOSIS — M818 Other osteoporosis without current pathological fracture: Secondary | ICD-10-CM | POA: Diagnosis not present

## 2022-08-01 DIAGNOSIS — E559 Vitamin D deficiency, unspecified: Secondary | ICD-10-CM | POA: Diagnosis not present

## 2022-08-01 DIAGNOSIS — E210 Primary hyperparathyroidism: Secondary | ICD-10-CM | POA: Diagnosis not present

## 2022-08-01 LAB — T4, FREE: Free T4: 1.24 ng/dL (ref 0.60–1.60)

## 2022-08-01 LAB — BASIC METABOLIC PANEL
BUN/Creatinine Ratio: 29 — ABNORMAL HIGH (ref 12–28)
BUN: 34 mg/dL (ref 10–36)
CO2: 23 mmol/L (ref 20–29)
Calcium: 10.4 mg/dL — ABNORMAL HIGH (ref 8.7–10.3)
Chloride: 104 mmol/L (ref 96–106)
Creatinine, Ser: 1.17 mg/dL — ABNORMAL HIGH (ref 0.57–1.00)
Glucose: 109 mg/dL — ABNORMAL HIGH (ref 70–99)
Potassium: 4.3 mmol/L (ref 3.5–5.2)
Sodium: 141 mmol/L (ref 134–144)
eGFR: 44 mL/min/{1.73_m2} — ABNORMAL LOW (ref >59)

## 2022-08-01 LAB — MAGNESIUM: Magnesium: 2.6 mg/dL — ABNORMAL HIGH (ref 1.6–2.3)

## 2022-08-01 LAB — VITAMIN D 25 HYDROXY (VIT D DEFICIENCY, FRACTURES): VITD: 50.67 ng/mL (ref 30.00–100.00)

## 2022-08-01 LAB — TSH: TSH: 3.49 u[IU]/mL (ref 0.35–5.50)

## 2022-08-01 NOTE — Patient Instructions (Signed)
Please stop at the lab.  Continue vitamin D 2000 units daily.  Continue Levothyroxine 75 mcg daily.  Take the thyroid hormone every day, with water, at least 30 minutes before breakfast, separated by at least 4 hours from: - acid reflux medications - calcium - iron - multivitamins  Please come back for a follow-up appointment in 1 year

## 2022-08-01 NOTE — Progress Notes (Signed)
Patient ID: Janice Small, female   DOB: [birth year], 89 y.o.   MRN: [id number]   HPI  Janice Small is a 89 y.o.-year-old female, initially referred by her PCP, Dr. Jerilee Hoh, returning for follow-up for hypercalcemia/hyperparathyroidism, vitamin D insufficiency, and hypothyroidism.  Last visit 1 year ago.  Interim history: No falls or fractures since last visit. No dizziness, vertigo, orthostasis. Uses a cane. She developed Afib and had cardioversion in 04/2022, but not successful. No CP, has occasional SOB.  Hypercalcemia  -For many years, at least since 2008.    At the end of 2019 she was also found to have a high PTH was referred to endocrinology.  I reviewed her pertinent labs Lab Results  Component Value Date   PTH 69 (H) 05/12/2019   PTH Comment 05/12/2019   PTH 32 12/03/2018   PTH 45 09/23/2018   PTH 92 (H) 06/14/2018   Lab Results  Component Value Date   CALCIUM 10.4 (H) 07/31/2022   CALCIUM 10.4 (H) 05/07/2022   CALCIUM 10.8 (H) 04/16/2022   CALCIUM 11.1 (H) 03/25/2022   CALCIUM 11.0 (H) 03/18/2022   CALCIUM 10.5 (H) 02/13/2022   CALCIUM 10.9 (H) 01/23/2022   CALCIUM 10.1 01/15/2022   CALCIUM 10.1 01/14/2022   CALCIUM 9.9 01/13/2022   CALCIUM 9.4 01/12/2022   CALCIUM 9.4 01/11/2022   CALCIUM 9.6 01/10/2022   CALCIUM 10.5 (H) 01/09/2022   CALCIUM 10.6 (H) 01/09/2022   CALCIUM 10.4 (H) 12/23/2021   CALCIUM 10.3 11/04/2021   CALCIUM 10.4 08/01/2021   CALCIUM 10.8 (H) 03/29/2020   CALCIUM 10.7 (H) 11/09/2019   CALCIUM 10.5 (H) 05/12/2019   CALCIUM 11.3 (H) 12/03/2018   CALCIUM 11.0 (H) 12/03/2018   CALCIUM 11.6 (H) 09/23/2018   CALCIUM 10.6 (H) 06/14/2018   CALCIUM 10.7 (H) 06/04/2018   CALCIUM 10.6 (H) 10/12/2017   CALCIUM 10.4 10/08/2016   CALCIUM 11.0 (H) 10/01/2015   CALCIUM 10.8 (H) 09/29/2014   CALCIUM 10.7 (H) 06/17/2013   CALCIUM 11.3 (H) 06/16/2012   CALCIUM 10.4 06/16/2011   CALCIUM 11.0 (H) 06/12/2010   CALCIUM 11.0 (H) 06/04/2009    CALCIUM 10.7 (H) 05/31/2008   CALCIUM 10.9 (H) 04/14/2007   Not on calcium supplements.  Reviewed previous investigations: Thyroid ultrasound (08/20/2018): 0.7 x 0.5 x 0.6 nodule at the left inferior thyroid pole, possibly a parathyroid adenoma  Technetium sestamibi parathyroid scan (08/20/2018): Suspicious for left inferior parathyroid adenoma  She would prefer to avoid surgery.  Osteoporosis:  Reviewed the most recent DXA scan report: 09/04/2021 ([facility]) Lumbar spine L1-L4 Femoral neck (FN) 33% distal radius  ultra distal radius  T-score   +1.2 RFN: -2.6 LFN: -2.0  -3.5  -4.7   06/29/2018 ([facility]) Lumbar spine L1-L4 Femoral neck (FN) 33% distal radius  ultra distal radius  T-score   +1.1 RFN: -2.3 LFN: -1.9  -3.8  -4.5  She declined further bone density scans.  She has a history of an ankle fracture several years ago.  She is on: - Prolia: 12/24/2018, 08/04/2019, 02/09/2020, 08/31/2020: 03/01/2021, 10/02/2021, 05/21/2022  She was going to [employer] - 3x a week - weight bearing exercises, however, she stopped during the coronavirus.  She has a h/o scoliosis. Has back pain.  No history of kidney stones.  + history of mild CKD.  Latest BUN/Cr: Lab Results  Component Value Date   BUN 34 07/31/2022   BUN 33 05/07/2022   CREATININE 1.17 (H) 07/31/2022   CREATININE 1.24 (H) 05/07/2022   At last visit  she was on HCTZ and we switched to Lasix 20 mg daily. Prev. On Amlodipine >> developed LE edema.  Currently back on amlodipine.  She has a history of vitamin D insufficiency: Lab Results  Component Value Date   VD25OH 43.12 08/01/2021   VD25OH 58.20 11/09/2019   VD25OH 48.47 05/12/2019   VD25OH 32.98 12/03/2018   VD25OH 23.46 (L) 09/23/2018  She continues on 2000 units vitamin D daily.  She denies a family history of hypercalcemia, pituitary tumors, thyroid cancer, or osteoporosis.  Hypothyroidism: -Controlled  Pt is on levothyroxine 75 mcg daily,  taken: - in am - fasting - at least 30 min from b'fast - no calcium - no iron - no multivitamins - no PPIs - not on Biotin She takes Magnesium, B12, vitamin C - later in the day.  Reviewed her TSH levels: Lab Results  Component Value Date   TSH 2.720 12/23/2021   TSH 2.74 08/01/2021   TSH 2.56 07/10/2020   TSH 1.65 11/09/2019   TSH 1.47 12/03/2018   TSH 2.30 09/23/2018   TSH 2.15 10/12/2017   TSH 2.54 10/08/2016   TSH 1.93 10/01/2015   TSH 1.69 09/29/2014   TSH 2.46 06/17/2013   TSH 1.21 06/16/2012   TSH 2.09 06/16/2011   TSH 1.84 06/12/2010   TSH 1.82 06/04/2009   TSH 1.85 05/31/2008   TSH 2.11 04/14/2007   Pt denies: - feeling nodules in neck - hoarseness - dysphagia - choking  She takes B12, vitamin C, magnesium. She also has a history of HTN. She continues IO injections.  She has a history of BRAO. She also has a history of diastolic CHF, persistent A-fib with cardioversion 04/2022.  SGLT2 inhibitor was added 12/2021 by cardiology.  ROS: + see HPI Musculoskeletal: + muscle aches/+ joint aches  I reviewed pt's medications, allergies, PMH, social hx, family hx, and changes were documented in the history of present illness. Otherwise, unchanged from my initial visit note.  Past Medical History:  Diagnosis Date   AKI (acute kidney injury) ([place])    BREAST BIOPSY, HX OF 11/27/2006   CHF (congestive heart failure) ([place])    COLONIC POLYPS, HX OF 11/27/2006   HEMORRHOIDS 01/17/2010   HYPERTENSION 11/27/2006   Hypertensive retinopathy    OU   HYPOTHYROIDISM 11/27/2006   Macular degeneration    Dry OU   MENOPAUSAL SYNDROME 11/27/2006   Vascular disease    Past Surgical History:  Procedure Laterality Date   ABDOMINAL HYSTERECTOMY     APPENDECTOMY     BREAST EXCISIONAL BIOPSY Right 1987   BREAST SURGERY     bx   CARDIOVERSION N/A 04/16/2022   Procedure: CARDIOVERSION;  Surgeon: [name], MD;  Location: [facility];  Service: Cardiovascular;   Laterality: N/A;   CATARACT EXTRACTION Bilateral    EYE SURGERY Bilateral    Cat Sx OU   HEMORRHOID BANDING     TONSILLECTOMY     Social History   Socioeconomic History   Marital status: Widowed -husband died 58    Spouse name: Not on file   Number of children: 3   Years of education: Not on file   Highest education level: Not on file  Occupational History    Retired  Scientist, product/process development strain: Not on file   Food insecurity:    Worry: Not on file    Inability: Not on file   Transportation needs:    Medical: Not on file    Non-medical: Not  on file  Tobacco Use   Smoking status: Never Smoker   Smokeless tobacco: Never Used  Substance and Sexual Activity   Alcohol use:  No       Drug use: No   Current Outpatient Medications on File Prior to Visit  Medication Sig Dispense Refill   amLODipine (NORVASC) 5 MG tablet Take 1 tablet (5 mg total) by mouth daily. 90 tablet 3   Bromfenac Sodium (PROLENSA) 0.07 % SOLN Place 1 drop into the left eye 4 (four) times daily. 3 mL 3   CLOBETASOL PROPIONATE E 0.05 % emollient cream Apply topically 2 (two) times daily.     cloNIDine (CATAPRES) 0.1 MG tablet Take 1 tablet (0.1 mg total) by mouth 2 (two) times daily. 180 tablet 3   dapagliflozin propanediol (FARXIGA) 10 MG TABS tablet Take 1 tablet (10 mg total) by mouth daily. 90 tablet 3   doxycycline (PERIOSTAT) 20 MG tablet Take 20 mg by mouth 2 (two) times daily.     furosemide (LASIX) 40 MG tablet Take 1 tablet (40 mg total) by mouth daily. 90 tablet 3   metoprolol succinate (TOPROL-XL) 25 MG 24 hr tablet Take 1 tablet (25 mg total) by mouth daily. 90 tablet 3   mupirocin ointment (BACTROBAN) 2 % Apply topically.     potassium chloride (KLOR-CON M) 10 MEQ tablet Take 1 tablet (10 mEq total) by mouth daily. 90 tablet 3   prednisoLONE acetate (PRED FORTE) 1 % ophthalmic suspension INSTILL 1 DROP INTO LEFT EYE 4 TIMES A DAY 15 mL 0   Rivaroxaban (XARELTO) 15 MG TABS tablet  Take 1 tablet (15 mg total) by mouth daily with supper. 90 tablet 1   SYNTHROID 75 MCG tablet TAKE 1 TABLET BY MOUTH DAILY 90 tablet 3   VITAMIN D PO Take 2,000 Units by mouth daily after lunch.     No current facility-administered medications on file prior to visit.   Allergies  Allergen Reactions   Codeine Phosphate Nausea And Vomiting   Family History  Problem Relation Age of Onset   Heart disease Father    Colon cancer Neg Hx    Esophageal cancer Neg Hx    Pancreatic cancer Neg Hx    Liver cancer Neg Hx    Stomach cancer Neg Hx   Also, diabetes and HTN and son.  PE: BP 118/76 (BP Location: Right Arm, Patient Position: Sitting, Cuff Size: Normal)   Pulse 85   Ht 5' (1.524 m)   Wt 133 lb 9.6 oz (60.6 kg)   SpO2 99%   BMI 26.09 kg/m  Wt Readings from Last 3 Encounters:  08/01/22 133 lb 9.6 oz (60.6 kg)  07/31/22 133 lb 12.8 oz (60.7 kg)  07/16/22 130 lb (59 kg)   Constitutional: normal weight, in NAD Eyes: EOMI, no exophthalmos ENT: no thyromegaly, no cervical lymphadenopathy Cardiovascular: RRR, No MRG Respiratory: CTA B Musculoskeletal: + deformities (scoliosis, Heberden and Bouchard nodules on the hands) Skin: no rashes Neurological: + faint tremor with outstretched hands  Assessment: 1. Hypercalcemia/hyperparathyroidism  2. Hypothyroidism  3.  Vitamin D insufficiency  4.  Osteoporosis  Plan: Patient with several instances of elevated calcium, with the highest being 11.6.  A corresponding intact PTH was also high, at 92.  After normalizing her vitamin D level, the PTH decreased to 32, which was still inappropriately high in the setting of a calcium of 11.3.  We previously checked an ionized calcium and this was also slightly high, at 5.8 in  06/2020 (4.8-5.6).  Latest total calcium level was slightly elevated on 05/07/2022. -She has osteoporosis, more pronounced at the level of the 33% distal radius and ultra distal radius and she also has a history of ankle  fracture.  No abdominal pain, depression, bone pain, kidney stones.  Previous imaging tests pointed towards the left inferior parathyroid adenoma but per her preference due to the mild nature of her hyperparathyroidism, and also due to age, we are following her without surgery -We agree he has been changed from HCTZ to Lasix -Would not repeat her calcium today  2.  Hypothyroidism - latest thyroid labs reviewed with pt. >> normal: Lab Results  Component Value Date   TSH 2.720 12/23/2021  - she continues on LT4 75 mcg daily - pt feels good on this dose.  Reviewing the chart, she lost 13 pounds since last visit!  We discussed that such a significant change may trigger the need to decrease the LT4 dose. - we discussed about taking the thyroid hormone every day, with water, >30 minutes before breakfast, separated by >4 hours from acid reflux medications, calcium, iron, multivitamins. Pt. is taking it correctly. - will check thyroid tests today: TSH and fT4 - If labs are abnormal, she will need to return for repeat TFTs in 1.5 months  3.  Vitamin D insufficiency -She had a low vitamin D level in 09/2018 -at that time we started 2000 units vit D daily, which she continues today -Latest vitamin D level was normal at last visit -We will recheck the level today  4. OP -Likely postmenopausal/age-related + due to primary hyperparathyroidism -Reviewed her bone density results from 2019: Significant osteoporosis at the ultra distal radius level, which is a surrogate marker for trabecular bone, more accurate than the spine BMD due to calcium accumulation in the spine related to osteoarthritis.  She did not present for bone density scans for our last visit, but since then, she had another bone density scan in 08/2021.  Unfortunately, this was checked on another DXA machine, previous, so it is not directly comparable.  The T-scores remain low, with the lowest values pending ultra distal radius, a surrogate  marker for the trabecular bone, found in the spine.  The spine BMD measurement appears not to be very accurate for her. -We started Prolia in 12/2018 and she is compliant with every 6 months injections.  Since last visit she had 2 injections: In 09/30/2021 and 01/30/2022. -She tolerates Prolia well, without thigh/hip/jaw pain -We discussed that we can continue Prolia for up to 10 years or possibly more if needed -We did discuss that Prolia can also help with high calcium levels -Will check her vitamin D level today  Component     Latest Ref Rng 08/01/2022  TSH     0.35 - 5.50 uIU/mL 3.49   T4,Free(Direct)     0.60 - 1.60 ng/dL 1.24   VITD     30.00 - 100.00 ng/mL 50.67   Normal labs.  Philemon Kingdom, MD PhD [facility]

## 2022-08-05 NOTE — Progress Notes (Signed)
[facility] Note  08/14/2022    CHIEF COMPLAINT Patient presents for Retina Follow Up  HISTORY OF PRESENT ILLNESS: Janice Small is a 89 y.o. female who presents to the clinic today for:  HPI     Retina Follow Up   Patient presents with  CRVO/BRVO.  In left eye.  This started 4 weeks ago.  Duration of 4 weeks.  Since onset it is stable.  I, the attending physician,  performed the HPI with the patient and updated documentation appropriately.        Comments   4 week retina follow up BRVO OS and IVV OS pt states vision has been stable since  her last visit she denies any flashes or floaters       Last edited by Bernarda Caffey, MD on 08/14/2022 11:49 AM.    Pt feels like she can read the newspaper better than she used to be able to  Referring physician: [place], Blondell Reveal, MD [place],  [place] [address]  HISTORICAL INFORMATION:   Selected notes from the MEDICAL RECORD NUMBER Referred by Dr. Jerline Pain for eval of BRVO OS LEE: 10.12.2021 Ocular Hx- Glc suspect   CURRENT MEDICATIONS: Current Outpatient Medications (Ophthalmic Drugs)  Medication Sig   Bromfenac Sodium (PROLENSA) 0.07 % SOLN Place 1 drop into the left eye 4 (four) times daily.   prednisoLONE acetate (PRED FORTE) 1 % ophthalmic suspension INSTILL 1 DROP INTO LEFT EYE 4 TIMES A DAY   No current facility-administered medications for this visit. (Ophthalmic Drugs)   Current Outpatient Medications (Other)  Medication Sig   amLODipine (NORVASC) 5 MG tablet Take 1 tablet (5 mg total) by mouth daily.   CLOBETASOL PROPIONATE E 0.05 % emollient cream Apply topically 2 (two) times daily.   cloNIDine (CATAPRES) 0.1 MG tablet Take 1 tablet (0.1 mg total) by mouth 2 (two) times daily.   dapagliflozin propanediol (FARXIGA) 10 MG TABS tablet Take 1 tablet (10 mg total) by mouth daily.   doxycycline (PERIOSTAT) 20 MG tablet Take 20 mg by mouth 2 (two) times daily.   furosemide  (LASIX) 40 MG tablet Take 1 tablet (40 mg total) by mouth daily.   metoprolol succinate (TOPROL-XL) 25 MG 24 hr tablet Take 1 tablet (25 mg total) by mouth daily.   mupirocin ointment (BACTROBAN) 2 % Apply topically.   potassium chloride (KLOR-CON M) 10 MEQ tablet Take 1 tablet (10 mEq total) by mouth daily.   Rivaroxaban (XARELTO) 15 MG TABS tablet Take 1 tablet (15 mg total) by mouth daily with supper.   SYNTHROID 75 MCG tablet TAKE 1 TABLET BY MOUTH DAILY   VITAMIN D PO Take 2,000 Units by mouth daily after lunch.   No current facility-administered medications for this visit. (Other)   REVIEW OF SYSTEMS: ROS   Positive for: Gastrointestinal, Musculoskeletal, Eyes Negative for: Constitutional, Neurological, Skin, Genitourinary, HENT, Endocrine, Cardiovascular, Respiratory, Psychiatric, Allergic/Imm, Heme/Lymph Last edited by Parthenia Ames, COT on 08/14/2022  8:45 AM.     ALLERGIES Allergies  Allergen Reactions   Codeine Phosphate Nausea And Vomiting   PAST MEDICAL HISTORY Past Medical History:  Diagnosis Date   AKI (acute kidney injury) ([place])    BREAST BIOPSY, HX OF 11/27/2006   CHF (congestive heart failure) ([place])    COLONIC POLYPS, HX OF 11/27/2006   HEMORRHOIDS 01/17/2010   HYPERTENSION 11/27/2006   Hypertensive retinopathy    OU   HYPOTHYROIDISM 11/27/2006   Macular degeneration  Dry OU   MENOPAUSAL SYNDROME 11/27/2006   Vascular disease    Past Surgical History:  Procedure Laterality Date   ABDOMINAL HYSTERECTOMY     APPENDECTOMY     BREAST EXCISIONAL BIOPSY Right 1987   BREAST SURGERY     bx   CARDIOVERSION N/A 04/16/2022   Procedure: CARDIOVERSION;  Surgeon: [name], MD;  Location: [facility];  Service: Cardiovascular;  Laterality: N/A;   CATARACT EXTRACTION Bilateral    EYE SURGERY Bilateral    Cat Sx OU   HEMORRHOID BANDING     TONSILLECTOMY     FAMILY HISTORY Family History  Problem Relation Age of Onset   Heart disease Father     Colon cancer Neg Hx    Esophageal cancer Neg Hx    Pancreatic cancer Neg Hx    Liver cancer Neg Hx    Stomach cancer Neg Hx    SOCIAL HISTORY Social History   Tobacco Use   Smoking status: Never   Smokeless tobacco: Never  Vaping Use   Vaping Use: Never used  Substance Use Topics   Alcohol use: Never    Comment: rarely   Drug use: No       OPHTHALMIC EXAM:  Base Eye Exam     Visual Acuity (Snellen - Linear)       Right Left   Dist cc 20/30 20/40    Correction: Glasses         Tonometry (Tonopen, 8:48 AM)       Right Left   Pressure 12 10         Pupils       Pupils Dark Light Shape React APD   Right PERRL 3 2 Round Brisk None   Left PERRL 3 2 Round Brisk None         Visual Fields       Left Right    Full Full         Extraocular Movement       Right Left    Full, Ortho Full, Ortho         Neuro/Psych     Oriented x3: Yes   Mood/Affect: Normal         Dilation     Both eyes: 2.5% Phenylephrine @ 8:48 AM           Slit Lamp and Fundus Exam     Slit Lamp Exam       Right Left   Lids/Lashes Dermatochalasis - upper lid Dermatochalasis - upper lid, mild Meibomian gland dysfunction   Conjunctiva/Sclera White and quiet White and quiet   Cornea 2+ inferior Punctate epithelial erosions, well healed temporal cataract wounds, arcus, early band K nasal and temporal, tear film debris 3+ inferior Punctate epithelial erosions, irregular epi, well healed temporal cataract wounds, arcus, tear film debris   Anterior Chamber Deep and quiet Deep and quiet   Iris Round and dilated Round and dilated   Lens 3 piece PC IOL in good position with open PC 3 piece PC IOL in good position, 1+PCO (non-central)   Anterior Vitreous Vitreous syneresis Vitreous syneresis, Posterior vitreous detachment         Fundus Exam       Right Left   Disc Pink and Sharp, Compact mild Pallor, Sharp rim, temporal PPA, Compact   C/D Ratio 0.5 0.4   Macula  Flat, Blunted foveal reflex, mild Drusen, RPE mottling and clumping, no heme or edema Blunted foveal reflex, focal  IRH and exudates temporal macula -- slightly improved, cystic changes and edema -- slightly increased, +focal laser changes, Drusen   Vessels attenuated, Tortuous attenuated, Tortuous   Periphery Attached, No heme Attached, mild reticular degeneration           Refraction     Wearing Rx       Sphere Cylinder Axis Add   Right -1.00 +0.50 027 +2.50   Left -0.75 +0.75 083 +2.50    Type: progressive           IMAGING AND PROCEDURES  Imaging and Procedures for 08/14/2022  OCT, Retina - OU - Both Eyes       Right Eye Quality was good. Central Foveal Thickness: 265. Progression has been stable. Findings include normal foveal contour, no IRF, no SRF, retinal drusen .   Left Eye Quality was good. Central Foveal Thickness: 285. Progression has worsened. Findings include normal foveal contour, no SRF, retinal drusen , intraretinal hyper-reflective material, intraretinal fluid (Mild interval increase in IRF/IRHM temporal macula and fovea ).   Notes *Images captured and stored on drive  Diagnosis / Impression:  OD: non-exu ARMD w/ fine drusen OS: BRVO w/ CME -- Mild interval increase in IRF/IRHM temporal macula and fovea   Clinical management:  See below  Abbreviations: NFP - Normal foveal profile. CME - cystoid macular edema. PED - pigment epithelial detachment. IRF - intraretinal fluid. SRF - subretinal fluid. EZ - ellipsoid zone. ERM - epiretinal membrane. ORA - outer retinal atrophy. ORT - outer retinal tubulation. SRHM - subretinal hyper-reflective material. IRHM - intraretinal hyper-reflective material      Intravitreal Injection, Pharmacologic Agent - OS - Left Eye       Time Out 08/14/2022. 9:20 AM. Confirmed correct patient, procedure, site, and patient consented.   Anesthesia Topical anesthesia was used. Anesthetic medications included Lidocaine 2%,  Proparacaine 0.5%.   Procedure Preparation included 5% betadine to ocular surface, eyelid speculum. A (32g) needle was used.   Injection: 6 mg faricimab-svoa 6 MG/0.05ML   Route: Intravitreal, Site: Left Eye   NDC: [id number], Lot: [id number], Expiration date: 08/13/2024, Waste: 0 mL   Post-op Post injection exam found visual acuity of at least counting fingers. The patient tolerated the procedure well. There were no complications. The patient received written and verbal post procedure care education. Post injection medications were not given.            ASSESSMENT/PLAN:    ICD-10-CM   1. Branch retinal vein occlusion of left eye with macular edema  H34.8320 OCT, Retina - OU - Both Eyes    Intravitreal Injection, Pharmacologic Agent - OS - Left Eye    faricimab-svoa (VABYSMO) '6mg'$ /0.3m intravitreal injection    2. Essential hypertension  I10     3. Hypertensive retinopathy of both eyes  H35.033     4. Early dry stage nonexudative age-related macular degeneration of both eyes  H35.3131     5. Pseudophakia, both eyes  Z96.1     6. Glaucoma suspect of both eyes  H40.003     7. Dry eyes  H04.123      1. BRVO w/ CME OS             - S/P IVA OS #1 (10.18.21), #2 (11.15.21), #3 (12.13.21), #4 (01.10.22), #5 (02.07.22), #6 (03.07.22), #7 (04.04.22), #8 (5.2.22) -- IVA resistance  - S/P IVE OS #1 (05.31.22) -- sample, #2 (06.28.22), #3 (07.26.22), #4 (08.23.22), #5 (09.20.22), #6 (10.20.22), #7 (11.17.22), #8 (12.15.22), #9 (  01.12.23), #10 (02.09.23), #11 (03.16.23), #12 (04.13.23), #13 (05.11.23), #14 (06.08.23), #15 (07.10.23), #16 (08.11.23),#17 (09.08.23), #18 (10.12.23), #19 (11.09.23) -- IVE resistance  - S/P IVV OS #1 (12.07.23 -- sample), #2 (01.04.24 -- sample)             - S/P focal laser OS (02.16.22) - BCVA 20/40 from 20/30 - OCT shows OS: Mild interval increase in IRF/IRHM temporal macula and fovea  - reduced PF/Prolensa (for CME component) to BID OS due to steroid  response -- pt states she has not been taking due to dealing with other health issues (A fib) - recommend IVV OS #3 today, 02.01.24 - pt wishes to proceed - RBA of procedure discussed, questions answered - informed consent obtained and signed - see procedure note - Avastin informed consent obtained and signed, 10.18.21 (OS) - Eylea informed consent obtained and signed, 05.31.22 (OS) - Vabysmo informed consent obtained and signed, 12.07.23 (OS) - Good Days for Eylea - cont PF/Prolensa BID OS - F/U 4 weeks, DFE, OCT, possible injection   2,3. Hypertensive retinopathy OU - discussed importance of tight BP control - monitor  4. Age related macular degeneration, non-exudative, both eyes  - The incidence, anatomy, and pathology of dry AMD, risk of progression, and the AREDS and AREDS 2 study including smoking risks discussed with patient.   - recommend Amsler grid monitoring  5. Pseudophakia OU  - s/p CE/IOL OU  - IOL in good position, doing well  - monitor  6. Glaucoma Suspect  - IOP 12,10  - discussed the possibility of starting a glaucoma drop in the future    - under the expert care of Dr. Jerline Pain  7. Dry eyes OU (OS>OD) - recommend artificial tears and lubricating ointment as needed  Ophthalmic Meds Ordered this visit:  Meds ordered this encounter  Medications   faricimab-svoa (VABYSMO) '6mg'$ /0.24m intravitreal injection     Return in about 4 weeks (around 09/11/2022) for f/u BRVO OS, DFE, OCT.  There are no Patient Instructions on file for this visit.  This document serves as a record of services personally performed by BGardiner Sleeper MD, PhD. It was created on their behalf by ASan Jetty BOwens Shark OA an ophthalmic technician. The creation of this record is the provider's dictation and/or activities during the visit.    Electronically signed by: ASan Jetty BOwens Shark O[place]01.23.2024 11:50 AM  BGardiner Sleeper M.D., Ph.D. Diseases & Surgery of the Retina and Vitreous [facility] I have reviewed the above documentation for accuracy and completeness, and I agree with the above. BGardiner Sleeper M.D., Ph.D. 08/14/22 11:51 AM   Abbreviations: M myopia (nearsighted); A astigmatism; H hyperopia (farsighted); P presbyopia; Mrx spectacle prescription;  CTL contact lenses; OD right eye; OS left eye; OU both eyes  XT exotropia; ET esotropia; PEK punctate epithelial keratitis; PEE punctate epithelial erosions; DES dry eye syndrome; MGD meibomian gland dysfunction; ATs artificial tears; PFAT's preservative free artificial tears; NYates Centernuclear sclerotic cataract; PSC posterior subcapsular cataract; ERM epi-retinal membrane; PVD posterior vitreous detachment; RD retinal detachment; DM diabetes mellitus; DR diabetic retinopathy; NPDR non-proliferative diabetic retinopathy; PDR proliferative diabetic retinopathy; CSME clinically significant macular edema; DME diabetic macular edema; dbh dot blot hemorrhages; CWS cotton wool spot; POAG primary open angle glaucoma; C/D cup-to-disc ratio; HVF humphrey visual field; GVF goldmann visual field; OCT optical coherence tomography; IOP intraocular pressure; BRVO Branch retinal vein occlusion; CRVO central retinal vein occlusion; CRAO central retinal artery occlusion; BRAO  branch retinal artery occlusion; RT retinal tear; SB scleral buckle; PPV pars plana vitrectomy; VH Vitreous hemorrhage; PRP panretinal laser photocoagulation; IVK intravitreal kenalog; VMT vitreomacular traction; MH Macular hole;  NVD neovascularization of the disc; NVE neovascularization elsewhere; AREDS age related eye disease study; ARMD age related macular degeneration; POAG primary open angle glaucoma; EBMD epithelial/anterior basement membrane dystrophy; ACIOL anterior chamber intraocular lens; IOL intraocular lens; PCIOL posterior chamber intraocular lens; Phaco/IOL phacoemulsification with intraocular lens placement; [place] photorefractive keratectomy; LASIK laser  assisted in situ keratomileusis; HTN hypertension; DM diabetes mellitus; COPD chronic obstructive pulmonary disease

## 2022-08-06 ENCOUNTER — Ambulatory Visit: Payer: Medicare Other | Admitting: Internal Medicine

## 2022-08-11 ENCOUNTER — Telehealth: Payer: Self-pay

## 2022-08-11 ENCOUNTER — Telehealth: Payer: Self-pay | Admitting: Internal Medicine

## 2022-08-11 NOTE — Telephone Encounter (Signed)
The patient was notified that [facility] said that her Xarelto is scheduled to be filled tomorrow because the pharmacy said that is when the patient's insurance will pay for it.

## 2022-08-11 NOTE — Telephone Encounter (Signed)
Patient called would like to speak with a nurse regarding her upcoming appt and not having any symptoms.

## 2022-08-11 NOTE — Telephone Encounter (Signed)
Pt stated that she is not having any issues with hemorrhoids since the beginning of December and requested to cancel OV on 08/15/2022 with Dr. Carlean Purl  OV canceled as pt requested: Pt made aware: Pt verbalized understanding with all questions answered.

## 2022-08-14 ENCOUNTER — Encounter (INDEPENDENT_AMBULATORY_CARE_PROVIDER_SITE_OTHER): Payer: Self-pay | Admitting: Ophthalmology

## 2022-08-14 ENCOUNTER — Ambulatory Visit (INDEPENDENT_AMBULATORY_CARE_PROVIDER_SITE_OTHER): Payer: Medicare Other | Admitting: Ophthalmology

## 2022-08-14 DIAGNOSIS — H353131 Nonexudative age-related macular degeneration, bilateral, early dry stage: Secondary | ICD-10-CM | POA: Diagnosis not present

## 2022-08-14 DIAGNOSIS — I10 Essential (primary) hypertension: Secondary | ICD-10-CM | POA: Diagnosis not present

## 2022-08-14 DIAGNOSIS — H35033 Hypertensive retinopathy, bilateral: Secondary | ICD-10-CM | POA: Diagnosis not present

## 2022-08-14 DIAGNOSIS — H04123 Dry eye syndrome of bilateral lacrimal glands: Secondary | ICD-10-CM

## 2022-08-14 DIAGNOSIS — Z961 Presence of intraocular lens: Secondary | ICD-10-CM

## 2022-08-14 DIAGNOSIS — H34832 Tributary (branch) retinal vein occlusion, left eye, with macular edema: Secondary | ICD-10-CM | POA: Diagnosis not present

## 2022-08-14 DIAGNOSIS — H40003 Preglaucoma, unspecified, bilateral: Secondary | ICD-10-CM

## 2022-08-14 MED ORDER — FARICIMAB-SVOA 6 MG/0.05ML IZ SOLN
6.0000 mg | INTRAVITREAL | Status: AC | PRN
  Administered 2022-08-14: 6 mg via INTRAVITREAL

## 2022-08-15 ENCOUNTER — Encounter: Payer: Medicare Other | Admitting: Internal Medicine

## 2022-08-19 ENCOUNTER — Ambulatory Visit (INDEPENDENT_AMBULATORY_CARE_PROVIDER_SITE_OTHER): Payer: Medicare Other

## 2022-08-19 ENCOUNTER — Encounter (HOSPITAL_BASED_OUTPATIENT_CLINIC_OR_DEPARTMENT_OTHER): Payer: Self-pay

## 2022-08-19 VITALS — Ht 60.0 in | Wt 130.0 lb

## 2022-08-19 DIAGNOSIS — Z0000 Encounter for general adult medical examination without abnormal findings: Secondary | ICD-10-CM | POA: Diagnosis not present

## 2022-08-19 NOTE — Progress Notes (Addendum)
Subjective:   Janice Small is a 89 y.o. female who presents for Medicare Annual (Subsequent) preventive examination.  Review of Systems    Virtual Visit via Telephone Note  I connected with  Janice Small on 08/19/22 at 11:00 AM EST by telephone and verified that I am speaking with the correct person using two identifiers.  Location: Patient: Home Provider: Office Persons participating in the virtual visit: patient/Nurse Health Advisor   I discussed the limitations, risks, security and privacy concerns of performing an evaluation and management service by telephone and the availability of in person appointments. The patient expressed understanding and agreed to proceed.  Interactive audio and video telecommunications were attempted between this nurse and patient, however failed, due to patient having technical difficulties OR patient did not have access to video capability.  We continued and completed visit with audio only.  Some vital signs may be absent or patient reported.   [name], LPN  Cardiac Risk Factors include: advanced age (>34mn, >>44women);hypertension     Objective:    Today's Vitals   08/19/22 1108  Weight: 130 lb (59 kg)  Height: 5' (1.524 m)   Body mass index is 25.39 kg/m.     08/19/2022   11:15 AM 04/16/2022    9:57 AM 01/18/2022   11:29 AM 01/09/2022   10:00 PM 01/09/2022    1:04 PM  Advanced Directives  Does Patient Have a Medical Advance Directive? Yes Yes Yes Yes No  Type of [place] will Living will Living will   Does patient want to make changes to medical advance directive?   No - Patient declined No - Patient declined   Copy of [place] Chart? No - copy requested      Would patient like information on creating a medical advance directive?   No - Patient declined No - Patient declined No - Guardian declined    Current  Medications (verified) Outpatient Encounter Medications as of 08/19/2022  Medication Sig   amLODipine (NORVASC) 5 MG tablet Take 1 tablet (5 mg total) by mouth daily.   Bromfenac Sodium (PROLENSA) 0.07 % SOLN Place 1 drop into the left eye 4 (four) times daily.   CLOBETASOL PROPIONATE E 0.05 % emollient cream Apply topically 2 (two) times daily.   cloNIDine (CATAPRES) 0.1 MG tablet Take 1 tablet (0.1 mg total) by mouth 2 (two) times daily.   dapagliflozin propanediol (FARXIGA) 10 MG TABS tablet Take 1 tablet (10 mg total) by mouth daily.   doxycycline (PERIOSTAT) 20 MG tablet Take 20 mg by mouth 2 (two) times daily.   furosemide (LASIX) 40 MG tablet Take 1 tablet (40 mg total) by mouth daily.   metoprolol succinate (TOPROL-XL) 25 MG 24 hr tablet Take 1 tablet (25 mg total) by mouth daily.   mupirocin ointment (BACTROBAN) 2 % Apply topically.   potassium chloride (KLOR-CON M) 10 MEQ tablet Take 1 tablet (10 mEq total) by mouth daily.   prednisoLONE acetate (PRED FORTE) 1 % ophthalmic suspension INSTILL 1 DROP INTO LEFT EYE 4 TIMES A DAY   Rivaroxaban (XARELTO) 15 MG TABS tablet Take 1 tablet (15 mg total) by mouth daily with supper.   SYNTHROID 75 MCG tablet TAKE 1 TABLET BY MOUTH DAILY   VITAMIN D PO Take 2,000 Units by mouth daily after lunch.   No facility-administered encounter medications on file as of 08/19/2022.    Allergies (verified) Codeine  phosphate   History: Past Medical History:  Diagnosis Date   AKI (acute kidney injury) ([place])    BREAST BIOPSY, HX OF 11/27/2006   CHF (congestive heart failure) ([place])    COLONIC POLYPS, HX OF 11/27/2006   HEMORRHOIDS 01/17/2010   HYPERTENSION 11/27/2006   Hypertensive retinopathy    OU   HYPOTHYROIDISM 11/27/2006   Macular degeneration    Dry OU   MENOPAUSAL SYNDROME 11/27/2006   Vascular disease    Past Surgical History:  Procedure Laterality Date   ABDOMINAL HYSTERECTOMY     APPENDECTOMY     BREAST EXCISIONAL BIOPSY Right 1987    BREAST SURGERY     bx   CARDIOVERSION N/A 04/16/2022   Procedure: CARDIOVERSION;  Surgeon: [name], MD;  Location: [facility] ENDOSCOPY;  Service: Cardiovascular;  Laterality: N/A;   CATARACT EXTRACTION Bilateral    EYE SURGERY Bilateral    Cat Sx OU   HEMORRHOID BANDING     TONSILLECTOMY     Family History  Problem Relation Age of Onset   Heart disease Father    Colon cancer Neg Hx    Esophageal cancer Neg Hx    Pancreatic cancer Neg Hx    Liver cancer Neg Hx    Stomach cancer Neg Hx    Social History   Socioeconomic History   Marital status: Widowed    Spouse name: Not on file   Number of children: Not on file   Years of education: Not on file   Highest education level: Not on file  Occupational History   Not on file  Tobacco Use   Smoking status: Never   Smokeless tobacco: Never  Vaping Use   Vaping Use: Never used  Substance and Sexual Activity   Alcohol use: Never    Comment: rarely   Drug use: No   Sexual activity: Not Currently  Other Topics Concern   Not on file  Social History Narrative   The patient is retired she has been in [place] for more than 50 years she has 3 sons that she is widowed.   Never tobacco, no alcohol, 2 caffeinated beverages daily no drugs   Social Determinants of Health   Financial Resource Strain: Low Risk  (08/19/2022)   Overall Financial Resource Strain (CARDIA)    Difficulty of Paying Living Expenses: Not hard at all  Food Insecurity: No Food Insecurity (08/19/2022)   Hunger Vital Sign    Worried About Running Out of Food in the Last Year: Never true    Ran Out of Food in the Last Year: Never true  Transportation Needs: No Transportation Needs (08/19/2022)   PRAPARE - Hydrologist (Medical): No    Lack of Transportation (Non-Medical): No  Physical Activity: Inactive (08/19/2022)   Exercise Vital Sign    Days of Exercise per Week: 0 days    Minutes of Exercise per Session: 0 min  Stress: No  Stress Concern Present (08/19/2022)   Brethren    Feeling of Stress : Not at all  Social Connections: Moderately Integrated (08/19/2022)   Social Connection and Isolation Panel [NHANES]    Frequency of Communication with Friends and Family: More than three times a week    Frequency of Social Gatherings with Friends and Family: More than three times a week    Attends Religious Services: More than 4 times per year    Active Member of [employer] or Organizations: Yes  Attends Archivist Meetings: More than 4 times per year    Marital Status: Widowed    Tobacco Counseling Counseling given: Not Answered   Clinical Intake:  Pre-visit preparation completed: No  Pain : No/denies pain     BMI - recorded: 25.39 Nutritional Status: BMI 25 -29 Overweight Nutritional Risks: None Diabetes: No  How often do you need to have someone help you when you read instructions, pamphlets, or other written materials from your doctor or pharmacy?: 1 - Never  Diabetic?  No  Interpreter Needed?: No  Information entered by :: Rolene Arbour LPN   Activities of Daily Living    08/19/2022   11:13 AM 07/16/2022    9:59 AM  In your present state of health, do you have any difficulty performing the following activities:  Hearing? 0 0  Vision? 0 0  Difficulty concentrating or making decisions? 0 0  Walking or climbing stairs? 0 0  Dressing or bathing? 0 0  Doing errands, shopping? 0 0  Preparing Food and eating ? N   Using the Toilet? N   In the past six months, have you accidently leaked urine? N   Do you have problems with loss of bowel control? N   Managing your Medications? N   Managing your Finances? N   Housekeeping or managing your Housekeeping? N     Patient Care Team: [place], Blondell Reveal, MD as PCP - General (Family Medicine) Donato Heinz, MD as PCP - Cardiology (Cardiology)  Indicate any recent Medical  Services you may have received from other than [facility] providers in the past year (date may be approximate).     Assessment:   This is a routine wellness examination for Janice Small.  Hearing/Vision screen Hearing Screening - Comments:: Denies hearing difficulties   Vision Screening - Comments:: Wears rx glasses - up to date with routine eye exams with  Dr Bing Plume  Dietary issues and exercise activities discussed: Exercise limited by: None identified   Goals Addressed               This Visit's Progress     No Current goals (pt-stated)         Depression Screen    08/19/2022   11:13 AM 07/16/2022    9:58 AM 11/04/2021   10:44 AM 07/09/2021    5:55 PM 03/14/2021    4:28 PM 02/03/2020    9:23 AM 12/21/2018   10:50 AM  PHQ 2/9 Scores  PHQ - 2 Score 0 0 0 0 0 0 0  PHQ- 9 Score 0 0  0 0 0 0  Exception Documentation  Medical reason Medical reason        Fall Risk    08/19/2022   11:14 AM 07/16/2022    9:58 AM 12/23/2021    1:34 PM 11/04/2021   10:44 AM 07/09/2021    5:55 PM  Fall Risk   Falls in the past year? 1  0 0 1  Number falls in past yr: 0 0 0 0 0  Injury with Fall? 0 0 0 0 0  Comment No injury or medical attention needed      Risk for fall due to : No Fall Risks No Fall Risks No Fall Risks No Fall Risks History of fall(s)  Follow up Falls prevention discussed Falls evaluation completed Falls evaluation completed Falls evaluation completed;Education provided Falls evaluation completed;Education provided    FALL RISK PREVENTION PERTAINING TO THE HOME:  Any stairs in  or around the home? Yes  If so, are there any without handrails? No  Home free of loose throw rugs in walkways, pet beds, electrical cords, etc? Yes  Adequate lighting in your home to reduce risk of falls? Yes   ASSISTIVE DEVICES UTILIZED TO PREVENT FALLS:  Life alert? No  Use of a cane, walker or w/c? Yes  Grab bars in the bathroom? No  Shower chair or bench in shower? No  Elevated toilet seat or a  handicapped toilet? No   TIMED UP AND GO:  Was the test performed? No .Audio Visit   Cognitive Function:        08/19/2022   11:15 AM  6CIT Screen  What Year? 0 points  What month? 0 points  What time? 0 points  Count back from 20 0 points  Months in reverse 0 points  Repeat phrase 0 points  Total Score 0 points    Immunizations Immunization History  Administered Date(s) Administered   Fluad Quad(high Dose 65+) 05/12/2019, 03/29/2020, 03/14/2021   Influenza Split 06/16/2011, 06/16/2012   Influenza Whole 04/14/2007, 04/20/2008, 07/03/2010   Influenza, High Dose Seasonal PF 05/31/2014, 04/02/2015, 04/11/2016, 04/10/2017, 06/04/2018   Influenza, Quadrivalent, Recombinant, Inj, Pf 05/06/2022   Influenza,inj,Quad PF,6+ Mos 04/01/2013   PFIZER Comirnaty(Gray Top)Covid-19 Tri-Sucrose Vaccine 01/13/2021   PFIZER(Purple Top)SARS-COV-2 Vaccination 09/11/2019, 10/11/2019, 05/09/2020   Pneumococcal Conjugate-13 06/17/2013   Pneumococcal Polysaccharide-23 06/16/2011   Tdap 06/16/2011    TDAP status: Due, Education has been provided regarding the importance of this vaccine. Advised may receive this vaccine at local pharmacy or Health Dept. Aware to provide a copy of the vaccination record if obtained from local pharmacy or Health Dept. Verbalized acceptance and understanding.  Flu Vaccine status: Up to date  Pneumococcal vaccine status: Up to date  Covid-19 vaccine status: Completed vaccines  Qualifies for Shingles Vaccine? Yes   Zostavax completed No   Shingrix Completed?: No.    Education has been provided regarding the importance of this vaccine. Patient has been advised to call insurance company to determine out of pocket expense if they have not yet received this vaccine. Advised may also receive vaccine at local pharmacy or Health Dept. Verbalized acceptance and understanding.  Screening Tests Health Maintenance  Topic Date Due   DTaP/Tdap/Td (2 - Td or Tdap) 06/15/2021    COVID-19 Vaccine (5 - 2023-24 season) 09/04/2022 (Originally 03/14/2022)   Zoster Vaccines- Shingrix (1 of 2) 11/17/2022 (Originally 11/16/1946)   Medicare Annual Wellness (AWV)  08/20/2023   Pneumonia Vaccine 76+ Years old  Completed   INFLUENZA VACCINE  Completed   DEXA SCAN  Completed   HPV VACCINES  Aged Out    Health Maintenance  Health Maintenance Due  Topic Date Due   DTaP/Tdap/Td (2 - Td or Tdap) 06/15/2021    Colorectal cancer screening: No longer required.   Mammogram status: No longer required due to Age.  Bone Density status: Completed 09/04/21. Results reflect: Bone density results: OSTEOPOROSIS. Repeat every   years.  Lung Cancer Screening: (Low Dose CT Chest recommended if Age 69-80 years, 30 pack-year currently smoking OR have quit w/in 15years.) does not qualify.     Additional Screening:  Hepatitis C Screening: does not qualify; Completed   Vision Screening: Recommended annual ophthalmology exams for early detection of glaucoma and other disorders of the eye. Is the patient up to date with their annual eye exam?  Yes  Who is the provider or what is the name of the office in  which the patient attends annual eye exams? [facility] If pt is not established with a provider, would they like to be referred to a provider to establish care? No .   Dental Screening: Recommended annual dental exams for proper oral hygiene  Community Resource Referral / Chronic Care Management:  CRR required this visit?  No   CCM required this visit?  No      Plan:     I have personally reviewed and noted the following in the patient's chart:   Medical and social history Use of alcohol, tobacco or illicit drugs  Current medications and supplements including opioid prescriptions. Patient is not currently taking opioid prescriptions. Functional ability and status Nutritional status Physical activity Advanced directives List of other physicians Hospitalizations, surgeries, and ER  visits in previous 12 months Vitals Screenings to include cognitive, depression, and falls Referrals and appointments  In addition, I have reviewed and discussed with patient certain preventive protocols, quality metrics, and best practice recommendations. A written personalized care plan for preventive services as well as general preventive health recommendations were provided to patient.     [name], LPN   [id number]   Nurse Notes: None

## 2022-08-19 NOTE — Patient Instructions (Addendum)
Janice Small , Thank you for taking time to come for your Medicare Wellness Visit. I appreciate your ongoing commitment to your health goals. Please review the following plan we discussed and let me know if I can assist you in the future.   These are the goals we discussed:  Goals       No Current goals (pt-stated)        This is a list of the screening recommended for you and due dates:  Health Maintenance  Topic Date Due   DTaP/Tdap/Td vaccine (2 - Td or Tdap) 06/15/2021   COVID-19 Vaccine (5 - 2023-24 season) 09/04/2022*   Zoster (Shingles) Vaccine (1 of 2) 11/17/2022*   Medicare Annual Wellness Visit  08/20/2023   Pneumonia Vaccine  Completed   Flu Shot  Completed   DEXA scan (bone density measurement)  Completed   HPV Vaccine  Aged Out  *Topic was postponed. The date shown is not the original due date.    Advanced directives: Please bring a copy of your health care power of attorney and living will to the office to be added to your chart at your convenience.   Conditions/risks identified: None  Next appointment: Follow up in one year for your annual wellness visit     Preventive Care 65 Years and Older, Female Preventive care refers to lifestyle choices and visits with your health care provider that can promote health and wellness. What does preventive care include? A yearly physical exam. This is also called an annual well check. Dental exams once or twice a year. Routine eye exams. Ask your health care provider how often you should have your eyes checked. Personal lifestyle choices, including: Daily care of your teeth and gums. Regular physical activity. Eating a healthy diet. Avoiding tobacco and drug use. Limiting alcohol use. Practicing safe sex. Taking low-dose aspirin every day. Taking vitamin and mineral supplements as recommended by your health care provider. What happens during an annual well check? The services and screenings done by your health care  provider during your annual well check will depend on your age, overall health, lifestyle risk factors, and family history of disease. Counseling  Your health care provider may ask you questions about your: Alcohol use. Tobacco use. Drug use. Emotional well-being. Home and relationship well-being. Sexual activity. Eating habits. History of falls. Memory and ability to understand (cognition). Work and work Statistician. Reproductive health. Screening  You may have the following tests or measurements: Height, weight, and BMI. Blood pressure. Lipid and cholesterol levels. These may be checked every 5 years, or more frequently if you are over 68 years old. Skin check. Lung cancer screening. You may have this screening every year starting at age 80 if you have a 30-pack-year history of smoking and currently smoke or have quit within the past 15 years. Fecal occult blood test (FOBT) of the stool. You may have this test every year starting at age 24. Flexible sigmoidoscopy or colonoscopy. You may have a sigmoidoscopy every 5 years or a colonoscopy every 10 years starting at age 63. Hepatitis C blood test. Hepatitis B blood test. Sexually transmitted disease (STD) testing. Diabetes screening. This is done by checking your blood sugar (glucose) after you have not eaten for a while (fasting). You may have this done every 1-3 years. Bone density scan. This is done to screen for osteoporosis. You may have this done starting at age 22. Mammogram. This may be done every 1-2 years. Talk to your health care provider  about how often you should have regular mammograms. Talk with your health care provider about your test results, treatment options, and if necessary, the need for more tests. Vaccines  Your health care provider may recommend certain vaccines, such as: Influenza vaccine. This is recommended every year. Tetanus, diphtheria, and acellular pertussis (Tdap, Td) vaccine. You may need a Td  booster every 10 years. Zoster vaccine. You may need this after age 41. Pneumococcal 13-valent conjugate (PCV13) vaccine. One dose is recommended after age 6. Pneumococcal polysaccharide (PPSV23) vaccine. One dose is recommended after age 4. Talk to your health care provider about which screenings and vaccines you need and how often you need them. This information is not intended to replace advice given to you by your health care provider. Make sure you discuss any questions you have with your health care provider. Document Released: 07/27/2015 Document Revised: 03/19/2016 Document Reviewed: 05/01/2015 Elsevier Interactive Patient Education  2017 [place] Prevention in the Home Falls can cause injuries. They can happen to people of all ages. There are many things you can do to make your home safe and to help prevent falls. What can I do on the outside of my home? Regularly fix the edges of walkways and driveways and fix any cracks. Remove anything that might make you trip as you walk through a door, such as a raised step or threshold. Trim any bushes or trees on the path to your home. Use bright outdoor lighting. Clear any walking paths of anything that might make someone trip, such as rocks or tools. Regularly check to see if handrails are loose or broken. Make sure that both sides of any steps have handrails. Any raised decks and porches should have guardrails on the edges. Have any leaves, snow, or ice cleared regularly. Use sand or salt on walking paths during winter. Clean up any spills in your garage right away. This includes oil or grease spills. What can I do in the bathroom? Use night lights. Install grab bars by the toilet and in the tub and shower. Do not use towel bars as grab bars. Use non-skid mats or decals in the tub or shower. If you need to sit down in the shower, use a plastic, non-slip stool. Keep the floor dry. Clean up any water that spills on the floor  as soon as it happens. Remove soap buildup in the tub or shower regularly. Attach bath mats securely with double-sided non-slip rug tape. Do not have throw rugs and other things on the floor that can make you trip. What can I do in the bedroom? Use night lights. Make sure that you have a light by your bed that is easy to reach. Do not use any sheets or blankets that are too big for your bed. They should not hang down onto the floor. Have a firm chair that has side arms. You can use this for support while you get dressed. Do not have throw rugs and other things on the floor that can make you trip. What can I do in the kitchen? Clean up any spills right away. Avoid walking on wet floors. Keep items that you use a lot in easy-to-reach places. If you need to reach something above you, use a strong step stool that has a grab bar. Keep electrical cords out of the way. Do not use floor polish or wax that makes floors slippery. If you must use wax, use non-skid floor wax. Do not have throw rugs and  other things on the floor that can make you trip. What can I do with my stairs? Do not leave any items on the stairs. Make sure that there are handrails on both sides of the stairs and use them. Fix handrails that are broken or loose. Make sure that handrails are as long as the stairways. Check any carpeting to make sure that it is firmly attached to the stairs. Fix any carpet that is loose or worn. Avoid having throw rugs at the top or bottom of the stairs. If you do have throw rugs, attach them to the floor with carpet tape. Make sure that you have a light switch at the top of the stairs and the bottom of the stairs. If you do not have them, ask someone to add them for you. What else can I do to help prevent falls? Wear shoes that: Do not have high heels. Have rubber bottoms. Are comfortable and fit you well. Are closed at the toe. Do not wear sandals. If you use a stepladder: Make sure that it is  fully opened. Do not climb a closed stepladder. Make sure that both sides of the stepladder are locked into place. Ask someone to hold it for you, if possible. Clearly mark and make sure that you can see: Any grab bars or handrails. First and last steps. Where the edge of each step is. Use tools that help you move around (mobility aids) if they are needed. These include: Canes. Walkers. Scooters. Crutches. Turn on the lights when you go into a dark area. Replace any light bulbs as soon as they burn out. Set up your furniture so you have a clear path. Avoid moving your furniture around. If any of your floors are uneven, fix them. If there are any pets around you, be aware of where they are. Review your medicines with your doctor. Some medicines can make you feel dizzy. This can increase your chance of falling. Ask your doctor what other things that you can do to help prevent falls. This information is not intended to replace advice given to you by your health care provider. Make sure you discuss any questions you have with your health care provider. Document Released: 04/26/2009 Document Revised: 12/06/2015 Document Reviewed: 08/04/2014 Elsevier Interactive Patient Education  2017 [employer].

## 2022-08-21 ENCOUNTER — Encounter (HOSPITAL_COMMUNITY): Payer: Self-pay | Admitting: *Deleted

## 2022-08-25 ENCOUNTER — Telehealth: Payer: Self-pay | Admitting: Internal Medicine

## 2022-08-25 NOTE — Telephone Encounter (Signed)
Pt stated that she was having some bleeding from her Hemorrhoids last night and some this AM, Pt stated that she has got them under control now and not bleeding.  Pt was scheduled for a Hem Banding appointment with Dr. Carlean Purl on 09/04/2022 at 2:50: Pt made aware Pt verbalized understanding with all questions answered.

## 2022-08-25 NOTE — Telephone Encounter (Signed)
Patient's hemorrhoids have been bleeding since last night and she is concerned. She wants to see physician today. Please advise.

## 2022-08-28 NOTE — Progress Notes (Signed)
[facility] Note  09/11/2022    CHIEF COMPLAINT Patient presents for Retina Follow Up  HISTORY OF PRESENT ILLNESS: Janice Small is a 89 y.o. female who presents to the clinic today for:  HPI     Retina Follow Up   Patient presents with  CRVO/BRVO.  In left eye.  Severity is moderate.  Duration of 4 weeks.  Since onset it is stable.  I, the attending physician,  performed the HPI with the patient and updated documentation appropriately.        Comments   Pt here for 4 wk ret f/u BRVO OS. Pt states she's unsure of any [facility] changes.       Last edited by Bernarda Caffey, MD on 09/11/2022 11:21 AM.      Referring physician: de [place], Raymond J, MD [place],  [place] [address]  HISTORICAL INFORMATION:   Selected notes from the MEDICAL RECORD NUMBER Referred by Dr. Jerline Pain for eval of BRVO OS LEE: 10.12.2021 Ocular Hx- Glc suspect   CURRENT MEDICATIONS: Current Outpatient Medications (Ophthalmic Drugs)  Medication Sig   Bromfenac Sodium (PROLENSA) 0.07 % SOLN Place 1 drop into the left eye 4 (four) times daily.   prednisoLONE acetate (PRED FORTE) 1 % ophthalmic suspension INSTILL 1 DROP INTO LEFT EYE 4 TIMES A DAY   No current facility-administered medications for this visit. (Ophthalmic Drugs)   Current Outpatient Medications (Other)  Medication Sig   amLODipine (NORVASC) 5 MG tablet Take 1 tablet (5 mg total) by mouth daily.   CLOBETASOL PROPIONATE E 0.05 % emollient cream Apply topically 2 (two) times daily.   cloNIDine (CATAPRES) 0.1 MG tablet Take 1 tablet (0.1 mg total) by mouth 2 (two) times daily.   dapagliflozin propanediol (FARXIGA) 10 MG TABS tablet Take 1 tablet (10 mg total) by mouth daily.   doxycycline (PERIOSTAT) 20 MG tablet Take 20 mg by mouth 2 (two) times daily.   furosemide (LASIX) 40 MG tablet Take 1 tablet (40 mg total) by mouth daily.   metoprolol succinate (TOPROL-XL) 25 MG 24 hr tablet Take 1 tablet (25 mg  total) by mouth daily.   mupirocin ointment (BACTROBAN) 2 % Apply topically.   potassium chloride (KLOR-CON M) 10 MEQ tablet Take 1 tablet (10 mEq total) by mouth daily.   Rivaroxaban (XARELTO) 15 MG TABS tablet Take 1 tablet (15 mg total) by mouth daily with supper.   SYNTHROID 75 MCG tablet TAKE 1 TABLET BY MOUTH DAILY   VITAMIN D PO Take 2,000 Units by mouth daily after lunch.   No current facility-administered medications for this visit. (Other)   REVIEW OF SYSTEMS: ROS   Positive for: Gastrointestinal, Musculoskeletal, Eyes Negative for: Constitutional, Neurological, Skin, Genitourinary, HENT, Endocrine, Cardiovascular, Respiratory, Psychiatric, Allergic/Imm, Heme/Lymph Last edited by Kingsley Spittle, COT on 09/11/2022  8:47 AM.     ALLERGIES Allergies  Allergen Reactions   Codeine Phosphate Nausea And Vomiting   PAST MEDICAL HISTORY Past Medical History:  Diagnosis Date   AKI (acute kidney injury) ([place])    Atrial fibrillation ([place])    BREAST BIOPSY, HX OF 11/27/2006   CHF (congestive heart failure) ([place])    COLONIC POLYPS, HX OF 11/27/2006   HEMORRHOIDS 01/17/2010   HYPERTENSION 11/27/2006   Hypertensive retinopathy    OU   HYPOTHYROIDISM 11/27/2006   Macular degeneration    Dry OU   MENOPAUSAL SYNDROME 11/27/2006   Vascular disease    Past Surgical History:  Procedure  Laterality Date   ABDOMINAL HYSTERECTOMY     APPENDECTOMY     BREAST EXCISIONAL BIOPSY Right 1987   BREAST SURGERY     bx   CARDIOVERSION N/A 04/16/2022   Procedure: CARDIOVERSION;  Surgeon: [name], MD;  Location: [facility];  Service: Cardiovascular;  Laterality: N/A;   CATARACT EXTRACTION Bilateral    EYE SURGERY Bilateral    Cat Sx OU   HEMORRHOID BANDING     TONSILLECTOMY     FAMILY HISTORY Family History  Problem Relation Age of Onset   Heart disease Father    Colon cancer Neg Hx    Esophageal cancer Neg Hx    Pancreatic cancer Neg Hx    Liver cancer Neg Hx     Stomach cancer Neg Hx    SOCIAL HISTORY Social History   Tobacco Use   Smoking status: Never   Smokeless tobacco: Never  Vaping Use   Vaping Use: Never used  Substance Use Topics   Alcohol use: Never    Comment: rarely   Drug use: No       OPHTHALMIC EXAM:  Base Eye Exam     Visual Acuity (Snellen - Linear)       Right Left   Dist cc 20/25 20/25 -2    Correction: Glasses         Tonometry (Tonopen, 8:53 AM)       Right Left   Pressure 12 14         Pupils       Pupils Dark Light Shape React APD   Right PERRL 3 2 Round Brisk None   Left PERRL 3 2 Round Brisk None         Visual Fields (Counting fingers)       Left Right    Full Full         Extraocular Movement       Right Left    Full, Ortho Full, Ortho         Neuro/Psych     Oriented x3: Yes   Mood/Affect: Normal         Dilation     Both eyes: 1.0% Mydriacyl, 2.5% Phenylephrine @ 8:54 AM           Slit Lamp and Fundus Exam     Slit Lamp Exam       Right Left   Lids/Lashes Dermatochalasis - upper lid Dermatochalasis - upper lid, mild Meibomian gland dysfunction   Conjunctiva/Sclera White and quiet White and quiet   Cornea 2+ inferior Punctate epithelial erosions, well healed temporal cataract wounds, arcus, early band K nasal and temporal, tear film debris 3+ inferior Punctate epithelial erosions, irregular epi, well healed temporal cataract wounds, arcus, tear film debris   Anterior Chamber Deep and quiet Deep and quiet   Iris Round and dilated Round and dilated   Lens 3 piece PC IOL in good position with open PC 3 piece PC IOL in good position, 1+PCO (non-central)   Anterior Vitreous Vitreous syneresis Vitreous syneresis, Posterior vitreous detachment         Fundus Exam       Right Left   Disc Pink and Sharp, Compact mild Pallor, Sharp rim, temporal PPA, Compact   C/D Ratio 0.5 0.4   Macula Flat, Blunted foveal reflex, mild Drusen, RPE mottling and clumping, no  heme or edema Blunted foveal reflex, focal IRH and exudates temporal macula -- stably improved, cystic changes and edema -- slightly  increased, +focal laser changes, Drusen   Vessels attenuated, Tortuous attenuated, Tortuous   Periphery Attached, No heme Attached, mild reticular degeneration           Refraction     Wearing Rx       Sphere Cylinder Axis Add   Right -1.00 +0.50 027 +2.50   Left -0.75 +0.75 083 +2.50    Type: progressive           IMAGING AND PROCEDURES  Imaging and Procedures for 09/11/2022  OCT, Retina - OU - Both Eyes       Right Eye Quality was good. Central Foveal Thickness: 268. Progression has been stable. Findings include normal foveal contour, no IRF, no SRF, retinal drusen .   Left Eye Quality was good. Central Foveal Thickness: 289. Progression has worsened. Findings include normal foveal contour, no SRF, retinal drusen , intraretinal hyper-reflective material, intraretinal fluid (Mild interval increase in IRF/IRHM temporal macula and fovea ).   Notes *Images captured and stored on drive  Diagnosis / Impression:  OD: non-exu ARMD w/ fine drusen OS: BRVO w/ CME -- Mild interval increase in IRF/IRHM temporal macula and fovea   Clinical management:  See below  Abbreviations: NFP - Normal foveal profile. CME - cystoid macular edema. PED - pigment epithelial detachment. IRF - intraretinal fluid. SRF - subretinal fluid. EZ - ellipsoid zone. ERM - epiretinal membrane. ORA - outer retinal atrophy. ORT - outer retinal tubulation. SRHM - subretinal hyper-reflective material. IRHM - intraretinal hyper-reflective material      Intravitreal Injection, Pharmacologic Agent - OS - Left Eye       Time Out 09/11/2022. 9:49 AM. Confirmed correct patient, procedure, site, and patient consented.   Anesthesia Topical anesthesia was used. Anesthetic medications included Lidocaine 2%, Proparacaine 0.5%.   Procedure Preparation included 5% betadine to  ocular surface, eyelid speculum. A (32g) needle was used.   Injection: 6 mg faricimab-svoa 6 MG/0.05ML   Route: Intravitreal, Site: Left Eye   NDC: [id number], Lot: [id number], Expiration date: 08/13/2024, Waste: 0 mL   Post-op Post injection exam found visual acuity of at least counting fingers. The patient tolerated the procedure well. There were no complications. The patient received written and verbal post procedure care education. Post injection medications were not given.            ASSESSMENT/PLAN:    ICD-10-CM   1. Branch retinal vein occlusion of left eye with macular edema  H34.8320 OCT, Retina - OU - Both Eyes    Intravitreal Injection, Pharmacologic Agent - OS - Left Eye    faricimab-svoa (VABYSMO) '6mg'$ /0.26m intravitreal injection    2. Essential hypertension  I10     3. Hypertensive retinopathy of both eyes  H35.033     4. Early dry stage nonexudative age-related macular degeneration of both eyes  H35.3131     5. Pseudophakia, both eyes  Z96.1     6. Glaucoma suspect of both eyes  H40.003     7. Dry eyes  H04.123      1. BRVO w/ CME OS             - S/P IVA OS #1 (10.18.21), #2 (11.15.21), #3 (12.13.21), #4 (01.10.22), #5 (02.07.22), #6 (03.07.22), #7 (04.04.22), #8 (5.2.22) -- IVA resistance  - S/P IVE OS #1 (05.31.22) -- sample, #2 (06.28.22), #3 (07.26.22), #4 (08.23.22), #5 (09.20.22), #6 (10.20.22), #7 (11.17.22), #8 (12.15.22), #9 (01.12.23), #10 (02.09.23), #11 (03.16.23), #12 (04.13.23), #13 (05.11.23), #14 (06.08.23), #15 (07.10.23), #16 (  08.11.23),#17 (09.08.23), #18 (10.12.23), #19 (11.09.23) -- IVE resistance  - S/P IVV OS #1 (12.07.23 -- sample), #2 (01.04.24 -- sample), #3 (02.01.24)             - S/P focal laser OS (02.16.22) - BCVA 20/25 from 20/40 - OCT shows OS: Mild interval increase in IRF/IRHM temporal macula and fovea at 4 wks - reduced PF/Prolensa (for CME component) to BID OS due to steroid response -- pt states she has not been taking  due to dealing with other health issues (A fib) - recommend IVV OS #4 today, 02.29.24 w/ f/u in 4 wks - pt wishes to proceed - RBA of procedure discussed, questions answered - informed consent obtained and signed - see procedure note - Avastin informed consent obtained and signed, 10.18.21 (OS) - Eylea informed consent obtained and signed, 05.31.22 (OS) - Vabysmo informed consent obtained and signed, 12.07.23 (OS) - Good Days for Eylea - cont PF/Prolensa BID OS - F/U 4 weeks, DFE, OCT, possible injection   2,3. Hypertensive retinopathy OU - discussed importance of tight BP control - monitor  4. Age related macular degeneration, non-exudative, both eyes  - The incidence, anatomy, and pathology of dry AMD, risk of progression, and the AREDS and AREDS 2 study including smoking risks discussed with patient.   - recommend Amsler grid monitoring  5. Pseudophakia OU  - s/p CE/IOL OU  - IOL in good position, doing well  - monitor  6. Glaucoma Suspect  - IOP 12,14  - discussed the possibility of starting a glaucoma drop in the future    - under the expert care of Dr. Jerline Pain  7. Dry eyes OU (OS>OD) - recommend artificial tears and lubricating ointment as needed  Ophthalmic Meds Ordered this visit:  Meds ordered this encounter  Medications   faricimab-svoa (VABYSMO) '6mg'$ /0.40m intravitreal injection     Return in about 4 weeks (around 10/09/2022) for f/u BRVO OS, DFE, OCT.  There are no Patient Instructions on file for this visit.  This document serves as a record of services personally performed by BGardiner Sleeper MD, PhD. It was created on their behalf by ASan Jetty BOwens Shark OA an ophthalmic technician. The creation of this record is the provider's dictation and/or activities during the visit.    Electronically signed by: ASan Jetty BOwens Shark O[place]02.15.2024 11:21 AM  BGardiner Sleeper M.D., Ph.D. Diseases & Surgery of the Retina and Vitreous [facility] I  have reviewed the above documentation for accuracy and completeness, and I agree with the above. BGardiner Sleeper M.D., Ph.D. 09/11/22 11:22 AM   Abbreviations: M myopia (nearsighted); A astigmatism; H hyperopia (farsighted); P presbyopia; Mrx spectacle prescription;  CTL contact lenses; OD right eye; OS left eye; OU both eyes  XT exotropia; ET esotropia; PEK punctate epithelial keratitis; PEE punctate epithelial erosions; DES dry eye syndrome; MGD meibomian gland dysfunction; ATs artificial tears; PFAT's preservative free artificial tears; [place]ree[place]uclear sclerotic cataract; PSC posterior subcapsular cataract; ERM epi-retinal membrane; PVD posterior vitreous detachment; RD retinal detachment; DM diabetes mellitus; DR diabetic retinopathy; NPDR non-proliferative diabetic retinopathy; PDR proliferative diabetic retinopathy; CSME clinically significant macular edema; DME diabetic macular edema; dbh dot blot hemorrhages; CWS cotton wool spot; POAG primary open angle glaucoma; C/D cup-to-disc ratio; HVF humphrey visual field; GVF goldmann visual field; OCT optical coherence tomography; IOP intraocular pressure; BRVO Branch retinal vein occlusion; CRVO central retinal vein occlusion; CRAO central retinal artery occlusion; BRAO branch retinal artery occlusion; RT  retinal tear; SB scleral buckle; PPV pars plana vitrectomy; VH Vitreous hemorrhage; PRP panretinal laser photocoagulation; IVK intravitreal kenalog; VMT vitreomacular traction; MH Macular hole;  NVD neovascularization of the disc; NVE neovascularization elsewhere; AREDS age related eye disease study; ARMD age related macular degeneration; POAG primary open angle glaucoma; EBMD epithelial/anterior basement membrane dystrophy; ACIOL anterior chamber intraocular lens; IOL intraocular lens; PCIOL posterior chamber intraocular lens; Phaco/IOL phacoemulsification with intraocular lens placement; [place] photorefractive keratectomy; LASIK laser assisted in situ  keratomileusis; HTN hypertension; DM diabetes mellitus; COPD chronic obstructive pulmonary disease

## 2022-09-04 ENCOUNTER — Ambulatory Visit (INDEPENDENT_AMBULATORY_CARE_PROVIDER_SITE_OTHER): Payer: Medicare Other | Admitting: Internal Medicine

## 2022-09-04 ENCOUNTER — Encounter: Payer: Self-pay | Admitting: Internal Medicine

## 2022-09-04 VITALS — BP 148/72 | HR 68 | Ht 60.0 in | Wt 133.0 lb

## 2022-09-04 DIAGNOSIS — K644 Residual hemorrhoidal skin tags: Secondary | ICD-10-CM | POA: Diagnosis not present

## 2022-09-04 DIAGNOSIS — Z7901 Long term (current) use of anticoagulants: Secondary | ICD-10-CM | POA: Insufficient documentation

## 2022-09-04 DIAGNOSIS — K648 Other hemorrhoids: Secondary | ICD-10-CM

## 2022-09-04 NOTE — Patient Instructions (Addendum)
HEMORRHOID BANDING PROCEDURE    FOLLOW-UP CARE   The procedure you have had should have been relatively painless since the banding of the area involved does not have nerve endings and there is no pain sensation.  The rubber band cuts off the blood supply to the hemorrhoid and the band may fall off as soon as 48 hours after the banding (the band may occasionally be seen in the toilet bowl following a bowel movement). You may notice a temporary feeling of fullness in the rectum which should respond adequately to plain Tylenol or Motrin.  Following the banding, avoid strenuous exercise that evening and resume full activity the next day.  A sitz bath (soaking in a warm tub) or bidet is soothing, and can be useful for cleansing the area after bowel movements.     To avoid constipation, take two tablespoons of natural wheat bran, natural oat bran, flax, Benefiber or any over the counter fiber supplement and increase your water intake to 7-8 glasses daily.    Unless you have been prescribed anorectal medication, do not put anything inside your rectum for two weeks: No suppositories, enemas, fingers, etc.  Occasionally, you may have more bleeding than usual after the banding procedure.  This is often from the untreated hemorrhoids rather than the treated one.  Don't be concerned if there is a tablespoon or so of blood.  If there is more blood than this, lie flat with your bottom higher than your head and apply an ice pack to the area. If the bleeding does not stop within a half an hour or if you feel faint, call our office at [phone] or go to the emergency room.  Problems are not common; however, if there is a substantial amount of bleeding, severe pain, chills, fever or difficulty passing urine (very rare) or other problems, you should call us at [phone] or report to the nearest emergency room.  Do not stay seated continuously for more than 2-3 hours for a day or two after the procedure.   Tighten your buttock muscles 10-15 times every two hours and take 10-15 deep breaths every 1-2 hours.  Do not spend more than a few minutes on the toilet if you cannot empty your bowel; instead re-visit the toilet at a later time.   We have scheduled your next possible hemorrhoid banding for 10/23/22 at 10:30 am.  I appreciate the opportunity to care for you. Silvano Rusk, MD, [facility]

## 2022-09-04 NOTE — Progress Notes (Signed)
Janice Small 89 y.o. May 17, 1928 [id number]  Assessment & Plan:   Encounter Diagnoses  Name Primary?   Internal and external bleeding hemorrhoids Yes   Chronic anticoagulation    Right anterior grade 3 hemorrhoid complex banded today.  She will return in about 2 months for reassessment. Continue Metamucil.    Subjective:   Chief Complaint: Bleeding hemorrhoids  HPI 89 year old white woman with known history of recurrent prolapsing bleeding hemorrhoids.  She has been banded by me in March, August and October 2023.  Right anterior and left lateral components.  She does well for a while but has persistent bleeding and is interested in another treatment.  Not having diarrhea or constipation much but if she has straining or frequent stools the hemorrhoids were flare.  She continues to take Xarelto for A-fib. Allergies  Allergen Reactions   Codeine Phosphate Nausea And Vomiting   Current Meds  Medication Sig   amLODipine (NORVASC) 5 MG tablet Take 1 tablet (5 mg total) by mouth daily.   Bromfenac Sodium (PROLENSA) 0.07 % SOLN Place 1 drop into the left eye 4 (four) times daily.   CLOBETASOL PROPIONATE E 0.05 % emollient cream Apply topically 2 (two) times daily.   cloNIDine (CATAPRES) 0.1 MG tablet Take 1 tablet (0.1 mg total) by mouth 2 (two) times daily.   dapagliflozin propanediol (FARXIGA) 10 MG TABS tablet Take 1 tablet (10 mg total) by mouth daily.   doxycycline (PERIOSTAT) 20 MG tablet Take 20 mg by mouth 2 (two) times daily.   furosemide (LASIX) 40 MG tablet Take 1 tablet (40 mg total) by mouth daily.   metoprolol succinate (TOPROL-XL) 25 MG 24 hr tablet Take 1 tablet (25 mg total) by mouth daily.   mupirocin ointment (BACTROBAN) 2 % Apply topically.   potassium chloride (KLOR-CON M) 10 MEQ tablet Take 1 tablet (10 mEq total) by mouth daily.   prednisoLONE acetate (PRED FORTE) 1 % ophthalmic suspension INSTILL 1 DROP INTO LEFT EYE 4 TIMES A DAY   Rivaroxaban (XARELTO)  15 MG TABS tablet Take 1 tablet (15 mg total) by mouth daily with supper.   SYNTHROID 75 MCG tablet TAKE 1 TABLET BY MOUTH DAILY   VITAMIN D PO Take 2,000 Units by mouth daily after lunch.   Past Medical History:  Diagnosis Date   AKI (acute kidney injury) ([place])    Atrial fibrillation ([place])    BREAST BIOPSY, HX OF 11/27/2006   CHF (congestive heart failure) ([place])    COLONIC POLYPS, HX OF 11/27/2006   HEMORRHOIDS 01/17/2010   HYPERTENSION 11/27/2006   Hypertensive retinopathy    OU   HYPOTHYROIDISM 11/27/2006   Macular degeneration    Dry OU   MENOPAUSAL SYNDROME 11/27/2006   Vascular disease    Past Surgical History:  Procedure Laterality Date   ABDOMINAL HYSTERECTOMY     APPENDECTOMY     BREAST EXCISIONAL BIOPSY Right 1987   BREAST SURGERY     bx   CARDIOVERSION N/A 04/16/2022   Procedure: CARDIOVERSION;  Surgeon: [name], MD;  Location: [facility];  Service: Cardiovascular;  Laterality: N/A;   CATARACT EXTRACTION Bilateral    EYE SURGERY Bilateral    Cat Sx OU   HEMORRHOID BANDING     TONSILLECTOMY     Social History   Social History Narrative   The patient is retired she has been in [place] for more than 50 years she has 3 sons that she is widowed.   Never tobacco, no alcohol, 2  caffeinated beverages daily no drugs   family history includes Heart disease in her father.   Review of Systems   Objective:   Physical Exam BP (!) 148/72   Pulse 68   Ht 5' (1.524 m)   Wt 133 lb (60.3 kg)   BMI 25.97 kg/m  NAD  Elderly kyphotc ww  Dorisann Frames, CMA present Rectal Gr 3 prolapsed RA hemorrhoid complex internal and external w/ bleeding stigmata/friable + small external LL and RP  Anoscopy same as above = Gr 2 LL and Gr 1 RP internal complexes PROCEDURE NOTE: The patient presents with symptomatic grade 3  hemorrhoids, requesting rubber band ligation of his/her hemorrhoidal disease.  All risks, benefits and alternative forms of therapy were  described and informed consent was obtained.   The anorectum was pre-medicated with 0.125% NTG and 5% lidocaine The decision was made to band the RA internal hemorrhoid, and the [place] was used to perform band ligation without complication.  Digital anorectal examination was then performed to assure proper positioning of the band, and to adjust the banded tissue as required.  The patient was discharged home without pain or other issues.  Dietary and behavioral recommendations were given and along with follow-up instructions.

## 2022-09-08 ENCOUNTER — Ambulatory Visit (HOSPITAL_BASED_OUTPATIENT_CLINIC_OR_DEPARTMENT_OTHER): Payer: Medicare Other | Admitting: Family Medicine

## 2022-09-08 ENCOUNTER — Encounter (HOSPITAL_BASED_OUTPATIENT_CLINIC_OR_DEPARTMENT_OTHER): Payer: Self-pay | Admitting: Family Medicine

## 2022-09-08 VITALS — BP 125/70 | HR 83 | Ht 60.0 in | Wt 134.2 lb

## 2022-09-08 DIAGNOSIS — R21 Rash and other nonspecific skin eruption: Secondary | ICD-10-CM | POA: Diagnosis not present

## 2022-09-08 DIAGNOSIS — I10 Essential (primary) hypertension: Secondary | ICD-10-CM | POA: Diagnosis not present

## 2022-09-08 NOTE — Assessment & Plan Note (Signed)
2 cm x 2 cm erythematous raised area on left forearm.  Reports there is no pruritus or pain.  Reports that she does see dermatology, applying some cream that was giving for a different condition x 1 last night with no improvement in symptoms.  She is unsure the name of this medication that she applied.  Encourage patient to notify the office of the name of medication, so provider can understand what she has already used.  No further treatment prescribed today.

## 2022-09-08 NOTE — Assessment & Plan Note (Addendum)
Taking amlodipine 5 mg and clonidine 0.1 mg daily as prescribed.  Denies chest pain, lower extremity edema, vision changes and headaches.  Endorses exertional shortness of breath due to atrial fibrillation.  Her last lab work was done July 31, 2022.  She is being managed by cardiology for atrial fib who prescribes her Xarelto and her blood pressure medications.  She is tolerating her medication well no side effects reported.  She reports that she does not monitor her blood pressure at home.  Her blood pressure is well-controlled after recheck today.  Refills per cardiology none needed today.  She will follow-up with PCP in 6 months, sooner if needed.  Next cardiology appointment is November 11, 2022.  She will discuss exertional dyspnea at that appointment.  She is able to converse with provider today with no obvious shortness of breath.

## 2022-09-08 NOTE — Progress Notes (Signed)
Established Patient Office Visit  Subjective   Patient ID: Janice Small, female    DOB: [birth year]  Age: 89 y.o. MRN: [id number]  Chief Complaint  Patient presents with   Follow-up    Pt here for f/u on general health, pt stated she has a rash on her left arm it has been there for a couple of days now     HPI  Hypertension Medication compliance: amlodipine 5 mg and clonidine 0.1 mg Denies chest pain, lower extremity edema, vision changes, headaches. Endorses exertional shortness of breath due to a-fib.  Pertinent lab work: 07/31/22  BMP stable kidney function Monitoring at home: does not monitor at home Tolerating medication well: no side effects reported  Continue current medication regimen: amlodipine 5 mg and clonidine 0.1 mg daily. Well-controlled after recheck.  Follow-up: 6 months  Refills per cardiology, none needed today.   Cardiology for a-fib and Xarelto management. Endocrine for vitamin D, hypothyroid.   Has a spot on left forearm that has been present for 2 days. Has dermatologist. No itching. Tried cream per derm last night without resolution. Unsure of name of cream used.   Review of Systems  Eyes:  Negative for blurred vision and double vision.  Respiratory:  Positive for shortness of breath (exertional with distance.).   Cardiovascular:  Negative for chest pain.  Neurological:  Negative for dizziness and headaches.      Objective:     BP 125/70   Pulse 83   Ht 5' (1.524 m)   Wt 134 lb 3.2 oz (60.9 kg)   SpO2 100%   BMI 26.21 kg/m  BP Readings from Last 3 Encounters:  09/08/22 125/70  09/04/22 (!) 148/72  08/01/22 118/76      Physical Exam Vitals and nursing note reviewed.  Constitutional:      General: She is not in acute distress.    Appearance: Normal appearance.  Cardiovascular:     Rate and Rhythm: Rhythm irregular.  Pulmonary:     Effort: Pulmonary effort is normal.     Breath sounds: Normal breath sounds.  Musculoskeletal:      Right lower leg: No edema.     Left lower leg: No edema.  Skin:    General: Skin is warm and dry.     Findings: Rash present. Rash is papular.     Comments: 2 cm x 2 cm erythematous raised area on left forearm. Not pruritic. Not painful.   Neurological:     General: No focal deficit present.     Mental Status: She is alert. Mental status is at baseline.  Psychiatric:        Mood and Affect: Mood normal.        Behavior: Behavior normal.        Thought Content: Thought content normal.        Judgment: Judgment normal.     No results found for any visits on 09/08/22.  Last metabolic panel Lab Results  Component Value Date   GLUCOSE 109 (H) 07/31/2022   NA 141 07/31/2022   K 4.3 07/31/2022   CL 104 07/31/2022   CO2 23 07/31/2022   BUN 34 07/31/2022   CREATININE 1.17 (H) 07/31/2022   EGFR 44 (L) 07/31/2022   CALCIUM 10.4 (H) 07/31/2022   PROT 6.8 01/09/2022   ALBUMIN 4.5 01/09/2022   LABGLOB 1.6 12/23/2021   AGRATIO 2.9 (H) 12/23/2021   BILITOT 1.4 (H) 01/09/2022   ALKPHOS 58 01/09/2022   AST 16 01/09/2022  ALT 43 01/09/2022   ANIONGAP 9 04/16/2022      The ASCVD Risk score (Arnett DK, et al., 2019) failed to calculate for the following reasons:   The 2019 ASCVD risk score is only valid for ages 59 to 68    Assessment & Plan:   Problem List Items Addressed This Visit     Essential hypertension - Primary    Taking amlodipine 5 mg and clonidine 0.1 mg daily as prescribed.  Denies chest pain, lower extremity edema, vision changes and headaches.  Endorses exertional shortness of breath due to atrial fibrillation.  Her last lab work was done July 31, 2022.  She is being managed by cardiology for atrial fib who prescribes her Xarelto and her blood pressure medications.  She is tolerating her medication well no side effects reported.  She reports that she does not monitor her blood pressure at home.  Her blood pressure is well-controlled after recheck today.  Refills per  cardiology none needed today.  She will follow-up with PCP in 6 months, sooner if needed.  Next cardiology appointment is November 11, 2022.  She will discuss exertional dyspnea at that appointment.  She is able to converse with provider today with no obvious shortness of breath.      Rash and nonspecific skin eruption    2 cm x 2 cm erythematous raised area on left forearm.  Reports there is no pruritus or pain.  Reports that she does see dermatology, applying some cream that was giving for a different condition x 1 last night with no improvement in symptoms.  She is unsure the name of this medication that she applied.  Encourage patient to notify the office of the name of medication, so provider can understand what she has already used.  No further treatment prescribed today.     Agrees with plan of care discussed.  Questions answered.   Return in about 6 months (around 03/09/2023) for htn.    Chalmers Guest, FNP

## 2022-09-10 ENCOUNTER — Telehealth: Payer: Self-pay | Admitting: Cardiology

## 2022-09-10 NOTE — Telephone Encounter (Signed)
Pt c/o Shortness Of Breath: STAT if SOB developed within the last 24 hours or pt is noticeably SOB on the phone  1. Are you currently SOB (can you hear that pt is SOB on the phone)? No  2. How long have you been experiencing SOB? For 4 days now.   3. Are you SOB when sitting or when up moving around? Moving around   4. Are you currently experiencing any other symptoms? States she also has afib and believes this may be the issue, but would like to speak to someone.

## 2022-09-10 NOTE — Telephone Encounter (Signed)
Left message to call the clinic.

## 2022-09-11 ENCOUNTER — Ambulatory Visit (INDEPENDENT_AMBULATORY_CARE_PROVIDER_SITE_OTHER): Payer: Medicare Other | Admitting: Ophthalmology

## 2022-09-11 ENCOUNTER — Encounter (INDEPENDENT_AMBULATORY_CARE_PROVIDER_SITE_OTHER): Payer: Self-pay | Admitting: Ophthalmology

## 2022-09-11 DIAGNOSIS — H04123 Dry eye syndrome of bilateral lacrimal glands: Secondary | ICD-10-CM

## 2022-09-11 DIAGNOSIS — H40003 Preglaucoma, unspecified, bilateral: Secondary | ICD-10-CM

## 2022-09-11 DIAGNOSIS — I10 Essential (primary) hypertension: Secondary | ICD-10-CM | POA: Diagnosis not present

## 2022-09-11 DIAGNOSIS — H353131 Nonexudative age-related macular degeneration, bilateral, early dry stage: Secondary | ICD-10-CM | POA: Diagnosis not present

## 2022-09-11 DIAGNOSIS — Z961 Presence of intraocular lens: Secondary | ICD-10-CM

## 2022-09-11 DIAGNOSIS — H35033 Hypertensive retinopathy, bilateral: Secondary | ICD-10-CM

## 2022-09-11 DIAGNOSIS — H34832 Tributary (branch) retinal vein occlusion, left eye, with macular edema: Secondary | ICD-10-CM | POA: Diagnosis not present

## 2022-09-11 MED ORDER — FARICIMAB-SVOA 6 MG/0.05ML IZ SOLN
6.0000 mg | INTRAVITREAL | Status: AC | PRN
  Administered 2022-09-11: 6 mg via INTRAVITREAL

## 2022-09-11 NOTE — Telephone Encounter (Signed)
Follow Up:    Patient is returning call from yesterday.

## 2022-09-11 NOTE — Telephone Encounter (Signed)
Called pt, she states when she got up this morning she was dizzy. She was SOB yesterday but that is better. I went to the eye doctor today, of course my eyes are dilated. "I think if I moved my head to quickly I think I would be dizzy." Pt's blood pressure was "okay" when she went to the doctor earlier today. Pt states she was not dizzy when she went from laying to standing the dizziness happened when she was walking. Pt states she is always told he should drink more water. "I keep my water handy I just don't drink it." Pt reports taking a pill that drys her mouth out at night, pt encouraged to drink more water through the day.

## 2022-09-12 ENCOUNTER — Other Ambulatory Visit: Payer: Self-pay

## 2022-09-12 DIAGNOSIS — I5032 Chronic diastolic (congestive) heart failure: Secondary | ICD-10-CM

## 2022-09-12 NOTE — Telephone Encounter (Signed)
Can we check in on her today to make sure her dizziness is improving?  Would check BMET to ensure not becoming dehydrated

## 2022-09-12 NOTE — Telephone Encounter (Signed)
Patient is returning call and is requesting return call.

## 2022-09-12 NOTE — Telephone Encounter (Signed)
Contacted patient, she states she is not dizzy today- I did advise of the recommendation to have blood work ordered. Patient states she can not come today, but will go next week.   BMET ordered.  Thanks!

## 2022-09-12 NOTE — Telephone Encounter (Signed)
Left message to call back  

## 2022-09-17 ENCOUNTER — Telehealth: Payer: Self-pay | Admitting: Cardiology

## 2022-09-17 NOTE — Telephone Encounter (Signed)
Returned call to pt she states that her AFIB "is really bothering her again" it is making her SOB more and more since her last appt. She states that she is "very SOB". She states that she was back in AFIB at her last appt and Dr Gardiner Rhyme told her that we could just try to do the ablation again anytime. She thinks it is happening all the time. She states that she has not taken her BP "for a long time so she does not know her HR. I have made an appt with Coletta Memos, FNP-C 09-26-22 to discuss ablation.

## 2022-09-17 NOTE — Telephone Encounter (Signed)
Patient is requesting to speak with Dr. Newman Nickels nurse. She states she has a few questions and would prefer to speak directly with the nurse if possible.

## 2022-09-18 NOTE — Telephone Encounter (Signed)
Recommend scheduling in A-fib clinic, can consider cardioversion +/- antiarrhythmic

## 2022-09-19 NOTE — Telephone Encounter (Signed)
Afib clinic appointment scheduled 3/11

## 2022-09-22 ENCOUNTER — Ambulatory Visit (HOSPITAL_COMMUNITY)
Admission: RE | Admit: 2022-09-22 | Discharge: 2022-09-22 | Disposition: A | Discharge status: Home / Self Care | Payer: Medicare Other | Source: Ambulatory Visit | Attending: Physician Assistant | Admitting: Physician Assistant

## 2022-09-22 ENCOUNTER — Encounter (HOSPITAL_COMMUNITY): Payer: Self-pay | Admitting: Physician Assistant

## 2022-09-22 VITALS — BP 128/70 | HR 80 | Ht 60.0 in | Wt 137.2 lb

## 2022-09-22 DIAGNOSIS — D6869 Other thrombophilia: Secondary | ICD-10-CM | POA: Diagnosis not present

## 2022-09-22 DIAGNOSIS — I4819 Other persistent atrial fibrillation: Secondary | ICD-10-CM

## 2022-09-22 DIAGNOSIS — Z79899 Other long term (current) drug therapy: Secondary | ICD-10-CM | POA: Diagnosis not present

## 2022-09-22 DIAGNOSIS — I5032 Chronic diastolic (congestive) heart failure: Secondary | ICD-10-CM | POA: Diagnosis not present

## 2022-09-22 DIAGNOSIS — Z7984 Long term (current) use of oral hypoglycemic drugs: Secondary | ICD-10-CM | POA: Insufficient documentation

## 2022-09-22 DIAGNOSIS — I110 Hypertensive heart disease with heart failure: Secondary | ICD-10-CM | POA: Insufficient documentation

## 2022-09-22 DIAGNOSIS — Z7901 Long term (current) use of anticoagulants: Secondary | ICD-10-CM | POA: Insufficient documentation

## 2022-09-22 MED ORDER — AMIODARONE HCL 200 MG PO TABS: ORAL_TABLET | ORAL | 0 refills | Status: DC

## 2022-09-22 NOTE — Progress Notes (Signed)
Primary Care Physician: [place], Blondell Reveal, MD Referring Physician: Dr Gardiner Rhyme Primary Cardiologist: Dr Joselyn Arrow Janice Small is a 89 y.o. female with a h/o  hx of chronic diastolic heart failure, hypertension, hypothyroidism who presents for follow-up. Noted to be in new onset A-fib at PCP clinic 12/23/2021.  Started on Xarelto daily. Admitted 6/29 with good diuresis. She saw Dr. Gardiner Rhyme 03/18/22 and the plan was to send here for f/u and if fluid status stable and no  bleeding form hemorrhoids to get pt scheduled for cardioversion. S/p DCCV 04/16/22.   She was seen by Dr Gardiner Rhyme on 07/31/22 and found to be back in rate controlled afib. She was doing well at the time and so a rate control strategy was pursued.   On follow up today, review of records demonstrates she contacted her cardiologist's office noting her Afib is bother her again specifically with shortness of breath worse than prior. She feels short of breath with minimal exertion. She checks her weight every morning per cardiologist recommendation and it has not significantly increased. She does not notice any lower extremity swelling. She has remained compliant with her current lasix and potassium medications. She has not missed any doses of Xarelto and does not have any bleeding concerns.   Today, she denies symptoms of palpitations, chest pain, orthopnea, PND, dizziness, presyncope, syncope, or neurologic sequela. The patient is tolerating medications without difficulties and is otherwise without complaint today.   Past Medical History:  Diagnosis Date   AKI (acute kidney injury) ([place])    Atrial fibrillation ([place])    BREAST BIOPSY, HX OF 11/27/2006   CHF (congestive heart failure) ([place])    COLONIC POLYPS, HX OF 11/27/2006   HEMORRHOIDS 01/17/2010   HYPERTENSION 11/27/2006   Hypertensive retinopathy    OU   HYPOTHYROIDISM 11/27/2006   Macular degeneration    Dry OU   MENOPAUSAL SYNDROME 11/27/2006   Vascular disease     Past Surgical History:  Procedure Laterality Date   ABDOMINAL HYSTERECTOMY     APPENDECTOMY     BREAST EXCISIONAL BIOPSY Right 1987   BREAST SURGERY     bx   CARDIOVERSION N/A 04/16/2022   Procedure: CARDIOVERSION;  Surgeon: [name], MD;  Location: [facility];  Service: Cardiovascular;  Laterality: N/A;   CATARACT EXTRACTION Bilateral    EYE SURGERY Bilateral    Cat Sx OU   HEMORRHOID BANDING     TONSILLECTOMY      Current Outpatient Medications  Medication Sig Dispense Refill   amLODipine (NORVASC) 5 MG tablet Take 1 tablet (5 mg total) by mouth daily. 90 tablet 3   Bromfenac Sodium (PROLENSA) 0.07 % SOLN Place 1 drop into the left eye 4 (four) times daily. 3 mL 3   CLOBETASOL PROPIONATE E 0.05 % emollient cream Apply topically 2 (two) times daily.     cloNIDine (CATAPRES) 0.1 MG tablet Take 1 tablet (0.1 mg total) by mouth 2 (two) times daily. 180 tablet 3   dapagliflozin propanediol (FARXIGA) 10 MG TABS tablet Take 1 tablet (10 mg total) by mouth daily. 90 tablet 3   doxycycline (PERIOSTAT) 20 MG tablet Take 20 mg by mouth 2 (two) times daily.     furosemide (LASIX) 40 MG tablet Take 1 tablet (40 mg total) by mouth daily. 90 tablet 3   metoprolol succinate (TOPROL-XL) 25 MG 24 hr tablet Take 1 tablet (25 mg total) by mouth daily. 90 tablet 3   mupirocin ointment (BACTROBAN) 2 %  Apply topically.     potassium chloride (KLOR-CON M) 10 MEQ tablet Take 1 tablet (10 mEq total) by mouth daily. 90 tablet 3   prednisoLONE acetate (PRED FORTE) 1 % ophthalmic suspension INSTILL 1 DROP INTO LEFT EYE 4 TIMES A DAY 15 mL 0   Rivaroxaban (XARELTO) 15 MG TABS tablet Take 1 tablet (15 mg total) by mouth daily with supper. 90 tablet 1   SYNTHROID 75 MCG tablet TAKE 1 TABLET BY MOUTH DAILY 90 tablet 3   VITAMIN D PO Take 2,000 Units by mouth daily after lunch.     No current facility-administered medications for this encounter.    Allergies  Allergen Reactions   Codeine  Phosphate Nausea And Vomiting    Social History   Socioeconomic History   Marital status: Widowed    Spouse name: Not on file   Number of children: Not on file   Years of education: Not on file   Highest education level: Not on file  Occupational History   Not on file  Tobacco Use   Smoking status: Never   Smokeless tobacco: Never   Tobacco comments:    Never smoke 09/22/22  Vaping Use   Vaping Use: Never used  Substance and Sexual Activity   Alcohol use: Never    Comment: rarely   Drug use: No   Sexual activity: Not Currently  Other Topics Concern   Not on file  Social History Narrative   The patient is retired she has been in [place] for more than 50 years she has 3 sons that she is widowed.   Never tobacco, no alcohol, 2 caffeinated beverages daily no drugs   Social Determinants of Health   Financial Resource Strain: Low Risk  (08/19/2022)   Overall Financial Resource Strain (CARDIA)    Difficulty of Paying Living Expenses: Not hard at all  Food Insecurity: No Food Insecurity (08/19/2022)   Hunger Vital Sign    Worried About Running Out of Food in the Last Year: Never true    Ran Out of Food in the Last Year: Never true  Transportation Needs: No Transportation Needs (08/19/2022)   PRAPARE - Hydrologist (Medical): No    Lack of Transportation (Non-Medical): No  Physical Activity: Inactive (08/19/2022)   Exercise Vital Sign    Days of Exercise per Week: 0 days    Minutes of Exercise per Session: 0 min  Stress: No Stress Concern Present (08/19/2022)   [place]    Feeling of Stress : Not at all  Social Connections: Moderately Integrated (08/19/2022)   Social Connection and Isolation Panel [NHANES]    Frequency of Communication with Friends and Family: More than three times a week    Frequency of Social Gatherings with Friends and Family: More than three times a week    Attends  Religious Services: More than 4 times per year    Active Member of [employer] or Organizations: Yes    Attends Archivist Meetings: More than 4 times per year    Marital Status: Widowed  Intimate Partner Violence: Not At Risk (08/19/2022)   Humiliation, Afraid, Rape, and Kick questionnaire    Fear of Current or Ex-Partner: No    Emotionally Abused: No    Physically Abused: No    Sexually Abused: No    Family History  Problem Relation Age of Onset   Heart disease Father    Colon cancer Neg  Hx    Esophageal cancer Neg Hx    Pancreatic cancer Neg Hx    Liver cancer Neg Hx    Stomach cancer Neg Hx     ROS- All systems are reviewed and negative except as per the HPI above  Physical Exam: Vitals:   09/22/22 1458  BP: 128/70  Pulse: 80  Weight: 62.2 kg  Height: 5' (1.524 m)   Wt Readings from Last 3 Encounters:  09/22/22 62.2 kg  09/08/22 60.9 kg  09/04/22 60.3 kg    Labs: Lab Results  Component Value Date   NA 141 07/31/2022   K 4.3 07/31/2022   CL 104 07/31/2022   CO2 23 07/31/2022   GLUCOSE 109 (H) 07/31/2022   BUN 34 07/31/2022   CREATININE 1.17 (H) 07/31/2022   CALCIUM 10.4 (H) 07/31/2022   MG 2.6 (H) 07/31/2022   No results found for: "INR" Lab Results  Component Value Date   CHOL 167 11/04/2021   HDL 86 11/04/2021   LDLCALC 69 11/04/2021   TRIG 58 11/04/2021     GEN- The patient is a well appearing  female, alert and oriented x 3 today.   HEENT-head normocephalic, atraumatic, sclera clear, conjunctiva pink, hearing intact, trachea midline. Lungs- Clear to ausculation bilaterally, normal work of breathing Heart- Irregular rate and rhythm, no murmurs, rubs or gallops  GI- soft, NT, ND, + BS Extremities- no clubbing, cyanosis, or edema MS- no significant deformity or atrophy Skin- no rash or lesion Psych- euthymic mood, full affect Neuro- strength and sensation are intact   EKG today demonstrates Atrial fibrillation HR 80   CHA2DS2-VASc  Score = 5  The patient's score is based upon: CHF History: 1 HTN History: 1 Diabetes History: 0 Stroke History: 0 Vascular Disease History: 0 Age Score: 2 Gender Score: 1       ASSESSMENT AND PLAN: 1. Persistent Atrial Fibrillation (ICD10:  I48.19) The patient's CHA2DS2-VASc score is 5, indicating a 7.2% annual risk of stroke.    She is in Afib today and primary complaint is shortness of breath. Overall low suspicion for acute CHF exacerbation given no significant weight change, compliance with diuretic, and unremarkable physical exam. Extensive discussion with patient and her son over the necessity for AAD therapy to help maintain normal sinus rhythm after DCCV.   We discussed the benefits vs risks of medications such as tikosyn and amiodarone. After discussion, patient would like to begin amiodarone for AAD therapy. This is reasonable given her age, normal CXR 01/09/22, and recent normal LFTs and TSH. She is on synthroid so this may need to be adjusted in the future.   We will begin amiodarone 200 mg BID for 2 weeks, then amiodarone 200 mg once daily.  Will schedule DCCV. Will see her in a couple of weeks for repeat ECG and DCCV labs.   2. Secondary Hypercoagulable State (ICD10:  D68.69) The patient is at significant risk for stroke/thromboembolism based upon her CHA2DS2-VASc Score of 5.  Continue Rivaroxaban (Xarelto).   3. Chronic diastolic CHF Stable, continue current regimen.  4. HTN Stable, no changes Continue current regimen   Follow up 2 weeks for repeat ECG and DCCV labs. Schedule DCCV.    [facility] [address] [place], [place] [address] [phone]

## 2022-09-22 NOTE — Patient Instructions (Addendum)
Start Amiodarone '200mg'$  -- take 1 tablet twice a day until 3/26 then reduce to once a day TAKE WITH FOOD    Cardioversion scheduled for: Thursday, April 4th   - Arrive at the [facility] and go to admitting at [facility] not eat or drink anything after midnight the night prior to your procedure.   - Take all your morning medication (except diabetic medications) with a sip of water prior to arrival.  - You will not be able to drive home after your procedure.    - Do NOT miss any doses of your blood thinner - if you should miss a dose please notify our office immediately.   - If you feel as if you go back into normal rhythm prior to scheduled cardioversion, please notify our office immediately.   If your procedure is canceled in the cardioversion suite you will be charged a cancellation fee.

## 2022-09-24 ENCOUNTER — Ambulatory Visit: Payer: Medicare Other | Admitting: General Practice

## 2022-09-25 ENCOUNTER — Observation Stay (HOSPITAL_COMMUNITY): Payer: Medicare Other

## 2022-09-25 ENCOUNTER — Inpatient Hospital Stay (HOSPITAL_COMMUNITY)
Admission: EM | Admit: 2022-09-25 | Discharge: 2022-10-01 | DRG: 378 | Disposition: A | Discharge status: Skilled Nursing Facility | Payer: Medicare Other | Attending: Internal Medicine | Admitting: Internal Medicine

## 2022-09-25 ENCOUNTER — Encounter (HOSPITAL_COMMUNITY): Payer: Self-pay | Admitting: Emergency Medicine

## 2022-09-25 ENCOUNTER — Other Ambulatory Visit: Payer: Self-pay

## 2022-09-25 DIAGNOSIS — R195 Other fecal abnormalities: Secondary | ICD-10-CM

## 2022-09-25 DIAGNOSIS — K31811 Angiodysplasia of stomach and duodenum with bleeding: Secondary | ICD-10-CM | POA: Diagnosis present

## 2022-09-25 DIAGNOSIS — I482 Chronic atrial fibrillation, unspecified: Secondary | ICD-10-CM | POA: Diagnosis present

## 2022-09-25 DIAGNOSIS — I10 Essential (primary) hypertension: Secondary | ICD-10-CM | POA: Diagnosis present

## 2022-09-25 DIAGNOSIS — Z8249 Family history of ischemic heart disease and other diseases of the circulatory system: Secondary | ICD-10-CM

## 2022-09-25 DIAGNOSIS — E039 Hypothyroidism, unspecified: Secondary | ICD-10-CM | POA: Diagnosis present

## 2022-09-25 DIAGNOSIS — Z602 Problems related to living alone: Secondary | ICD-10-CM | POA: Diagnosis present

## 2022-09-25 DIAGNOSIS — D649 Anemia, unspecified: Secondary | ICD-10-CM

## 2022-09-25 DIAGNOSIS — E872 Acidosis, unspecified: Secondary | ICD-10-CM

## 2022-09-25 DIAGNOSIS — Z86010 Personal history of colonic polyps: Secondary | ICD-10-CM

## 2022-09-25 DIAGNOSIS — K625 Hemorrhage of anus and rectum: Secondary | ICD-10-CM | POA: Diagnosis present

## 2022-09-25 DIAGNOSIS — R531 Weakness: Secondary | ICD-10-CM | POA: Diagnosis present

## 2022-09-25 DIAGNOSIS — E876 Hypokalemia: Secondary | ICD-10-CM | POA: Diagnosis not present

## 2022-09-25 DIAGNOSIS — I5032 Chronic diastolic (congestive) heart failure: Secondary | ICD-10-CM

## 2022-09-25 DIAGNOSIS — K921 Melena: Secondary | ICD-10-CM

## 2022-09-25 DIAGNOSIS — K449 Diaphragmatic hernia without obstruction or gangrene: Secondary | ICD-10-CM | POA: Diagnosis present

## 2022-09-25 DIAGNOSIS — Z885 Allergy status to narcotic agent status: Secondary | ICD-10-CM

## 2022-09-25 DIAGNOSIS — K5520 Angiodysplasia of colon without hemorrhage: Secondary | ICD-10-CM

## 2022-09-25 DIAGNOSIS — D62 Acute posthemorrhagic anemia: Secondary | ICD-10-CM

## 2022-09-25 DIAGNOSIS — E210 Primary hyperparathyroidism: Secondary | ICD-10-CM | POA: Diagnosis present

## 2022-09-25 DIAGNOSIS — E861 Hypovolemia: Secondary | ICD-10-CM | POA: Diagnosis present

## 2022-09-25 DIAGNOSIS — H3530 Unspecified macular degeneration: Secondary | ICD-10-CM | POA: Diagnosis present

## 2022-09-25 DIAGNOSIS — K254 Chronic or unspecified gastric ulcer with hemorrhage: Secondary | ICD-10-CM | POA: Diagnosis not present

## 2022-09-25 DIAGNOSIS — K922 Gastrointestinal hemorrhage, unspecified: Principal | ICD-10-CM | POA: Diagnosis present

## 2022-09-25 DIAGNOSIS — T45515A Adverse effect of anticoagulants, initial encounter: Secondary | ICD-10-CM | POA: Diagnosis present

## 2022-09-25 DIAGNOSIS — I130 Hypertensive heart and chronic kidney disease with heart failure and stage 1 through stage 4 chronic kidney disease, or unspecified chronic kidney disease: Secondary | ICD-10-CM | POA: Diagnosis present

## 2022-09-25 DIAGNOSIS — Z79890 Hormone replacement therapy (postmenopausal): Secondary | ICD-10-CM

## 2022-09-25 DIAGNOSIS — D6832 Hemorrhagic disorder due to extrinsic circulating anticoagulants: Secondary | ICD-10-CM | POA: Diagnosis present

## 2022-09-25 DIAGNOSIS — K5521 Angiodysplasia of colon with hemorrhage: Secondary | ICD-10-CM | POA: Diagnosis present

## 2022-09-25 DIAGNOSIS — K5731 Diverticulosis of large intestine without perforation or abscess with bleeding: Secondary | ICD-10-CM

## 2022-09-25 DIAGNOSIS — Z7901 Long term (current) use of anticoagulants: Secondary | ICD-10-CM | POA: Diagnosis not present

## 2022-09-25 DIAGNOSIS — H35039 Hypertensive retinopathy, unspecified eye: Secondary | ICD-10-CM | POA: Diagnosis present

## 2022-09-25 DIAGNOSIS — I2489 Other forms of acute ischemic heart disease: Secondary | ICD-10-CM | POA: Diagnosis present

## 2022-09-25 DIAGNOSIS — K2971 Gastritis, unspecified, with bleeding: Secondary | ICD-10-CM | POA: Diagnosis present

## 2022-09-25 DIAGNOSIS — N1832 Chronic kidney disease, stage 3b: Secondary | ICD-10-CM

## 2022-09-25 DIAGNOSIS — E870 Hyperosmolality and hypernatremia: Secondary | ICD-10-CM | POA: Diagnosis present

## 2022-09-25 DIAGNOSIS — N179 Acute kidney failure, unspecified: Secondary | ICD-10-CM

## 2022-09-25 DIAGNOSIS — E860 Dehydration: Secondary | ICD-10-CM | POA: Diagnosis present

## 2022-09-25 DIAGNOSIS — Z79899 Other long term (current) drug therapy: Secondary | ICD-10-CM

## 2022-09-25 DIAGNOSIS — R339 Retention of urine, unspecified: Secondary | ICD-10-CM | POA: Diagnosis present

## 2022-09-25 DIAGNOSIS — R7989 Other specified abnormal findings of blood chemistry: Secondary | ICD-10-CM

## 2022-09-25 DIAGNOSIS — K642 Third degree hemorrhoids: Secondary | ICD-10-CM | POA: Diagnosis present

## 2022-09-25 DIAGNOSIS — I4891 Unspecified atrial fibrillation: Secondary | ICD-10-CM | POA: Diagnosis present

## 2022-09-25 DIAGNOSIS — K649 Unspecified hemorrhoids: Secondary | ICD-10-CM

## 2022-09-25 LAB — COMPREHENSIVE METABOLIC PANEL
ALT: 35 U/L (ref 0–44)
AST: 45 U/L — ABNORMAL HIGH (ref 15–41)
Albumin: 3.2 g/dL — ABNORMAL LOW (ref 3.5–5.0)
Alkaline Phosphatase: 31 U/L — ABNORMAL LOW (ref 38–126)
Anion gap: 17 — ABNORMAL HIGH (ref 5–15)
BUN: 83 mg/dL — ABNORMAL HIGH (ref 8–23)
CO2: 14 mmol/L — ABNORMAL LOW (ref 22–32)
Calcium: 8.9 mg/dL (ref 8.9–10.3)
Chloride: 106 mmol/L (ref 98–111)
Creatinine, Ser: 1.87 mg/dL — ABNORMAL HIGH (ref 0.44–1.00)
GFR, Estimated: 25 mL/min — ABNORMAL LOW (ref >60)
Glucose, Bld: 125 mg/dL — ABNORMAL HIGH (ref 70–99)
Potassium: 4 mmol/L (ref 3.5–5.1)
Sodium: 137 mmol/L (ref 135–145)
Total Bilirubin: 0.8 mg/dL (ref 0.3–1.2)
Total Protein: 5 g/dL — ABNORMAL LOW (ref 6.5–8.1)

## 2022-09-25 LAB — POC OCCULT BLOOD, ED: Fecal Occult Bld: POSITIVE — AB

## 2022-09-25 LAB — CBC
HCT: 26.7 % — ABNORMAL LOW (ref 36.0–46.0)
HCT: 27 % — ABNORMAL LOW (ref 36.0–46.0)
Hemoglobin: 8.9 g/dL — ABNORMAL LOW (ref 12.0–15.0)
Hemoglobin: 9.3 g/dL — ABNORMAL LOW (ref 12.0–15.0)
MCH: 30.9 pg (ref 26.0–34.0)
MCH: 30.6 pg (ref 26.0–34.0)
MCHC: 33.3 g/dL (ref 30.0–36.0)
MCHC: 34.4 g/dL (ref 30.0–36.0)
MCV: 92.7 fL (ref 80.0–100.0)
MCV: 88.8 fL (ref 80.0–100.0)
Platelets: 188 10*3/uL (ref 150–400)
Platelets: 201 10*3/uL (ref 150–400)
RBC: 2.88 MIL/uL — ABNORMAL LOW (ref 3.87–5.11)
RBC: 3.04 MIL/uL — ABNORMAL LOW (ref 3.87–5.11)
RDW: 15.6 % — ABNORMAL HIGH (ref 11.5–15.5)
RDW: 15.4 % (ref 11.5–15.5)
WBC: 8.3 10*3/uL (ref 4.0–10.5)
WBC: 9.7 10*3/uL (ref 4.0–10.5)
nRBC: 1.5 % — ABNORMAL HIGH (ref 0.0–0.2)
nRBC: 1.5 % — ABNORMAL HIGH (ref 0.0–0.2)

## 2022-09-25 LAB — CBC WITH DIFFERENTIAL/PLATELET
Abs Immature Granulocytes: 0.04 10*3/uL (ref 0.00–0.07)
Basophils Absolute: 0 10*3/uL (ref 0.0–0.1)
Basophils Relative: 0 %
Eosinophils Absolute: 0 10*3/uL (ref 0.0–0.5)
Eosinophils Relative: 0 %
HCT: 13.4 % — ABNORMAL LOW (ref 36.0–46.0)
Hemoglobin: 3.9 g/dL — CL (ref 12.0–15.0)
Immature Granulocytes: 1 %
Lymphocytes Relative: 11 %
Lymphs Abs: 0.7 10*3/uL (ref 0.7–4.0)
MCH: 28.9 pg (ref 26.0–34.0)
MCHC: 29.1 g/dL — ABNORMAL LOW (ref 30.0–36.0)
MCV: 99.3 fL (ref 80.0–100.0)
Monocytes Absolute: 0.4 10*3/uL (ref 0.1–1.0)
Monocytes Relative: 7 %
Neutro Abs: 5.3 10*3/uL (ref 1.7–7.7)
Neutrophils Relative %: 81 %
Platelets: 263 10*3/uL (ref 150–400)
RBC: 1.35 MIL/uL — ABNORMAL LOW (ref 3.87–5.11)
RDW: 17.2 % — ABNORMAL HIGH (ref 11.5–15.5)
WBC: 6.5 10*3/uL (ref 4.0–10.5)
nRBC: 1.2 % — ABNORMAL HIGH (ref 0.0–0.2)

## 2022-09-25 LAB — URINALYSIS, ROUTINE W REFLEX MICROSCOPIC
Bacteria, UA: NONE SEEN
Bilirubin Urine: NEGATIVE
Glucose, UA: 50 mg/dL — AB
Ketones, ur: NEGATIVE mg/dL
Nitrite: NEGATIVE
Protein, ur: NEGATIVE mg/dL
Specific Gravity, Urine: 1.013 (ref 1.005–1.030)
pH: 5 (ref 5.0–8.0)

## 2022-09-25 LAB — TROPONIN I (HIGH SENSITIVITY)
Troponin I (High Sensitivity): 25 ng/L — ABNORMAL HIGH (ref <18)
Troponin I (High Sensitivity): 26 ng/L — ABNORMAL HIGH (ref <18)

## 2022-09-25 LAB — ABO/RH: ABO/RH(D): O POS

## 2022-09-25 LAB — PREPARE RBC (CROSSMATCH)

## 2022-09-25 LAB — BRAIN NATRIURETIC PEPTIDE: B Natriuretic Peptide: 467.9 pg/mL — ABNORMAL HIGH (ref 0.0–100.0)

## 2022-09-25 MED ORDER — PANTOPRAZOLE 80MG IVPB - SIMPLE MED
80.0000 mg | Freq: Once | INTRAVENOUS | Status: AC
  Administered 2022-09-25: 80 mg via INTRAVENOUS
  Filled 2022-09-25: qty 100

## 2022-09-25 MED ORDER — ONDANSETRON HCL 4 MG/2ML IJ SOLN: 4.0000 mg | Freq: Four times a day (QID) | INTRAMUSCULAR | Status: DC | PRN

## 2022-09-25 MED ORDER — FUROSEMIDE 10 MG/ML IJ SOLN
20.0000 mg | Freq: Once | INTRAMUSCULAR | Status: AC
  Administered 2022-09-25: 20 mg via INTRAVENOUS
  Filled 2022-09-25: qty 2

## 2022-09-25 MED ORDER — SODIUM CHLORIDE 0.9 % IV SOLN: 250.0000 mL | INTRAVENOUS | Status: DC | PRN

## 2022-09-25 MED ORDER — PANTOPRAZOLE INFUSION (NEW) - SIMPLE MED
8.0000 mg/h | INTRAVENOUS | Status: DC
  Administered 2022-09-25 – 2022-09-28 (×6): 8 mg/h via INTRAVENOUS
  Filled 2022-09-25 (×8): qty 100

## 2022-09-25 MED ORDER — SODIUM CHLORIDE 0.9% FLUSH
3.0000 mL | Freq: Two times a day (BID) | INTRAVENOUS | Status: DC
  Administered 2022-09-25 – 2022-09-26 (×3): 3 mL via INTRAVENOUS

## 2022-09-25 MED ORDER — AMLODIPINE BESYLATE 5 MG PO TABS
5.0000 mg | ORAL_TABLET | Freq: Every day | ORAL | Status: DC
  Administered 2022-09-26: 5 mg via ORAL
  Filled 2022-09-25: qty 1

## 2022-09-25 MED ORDER — AMIODARONE HCL 200 MG PO TABS
200.0000 mg | ORAL_TABLET | Freq: Two times a day (BID) | ORAL | Status: DC
  Administered 2022-09-25 – 2022-10-01 (×12): 200 mg via ORAL
  Filled 2022-09-25 (×12): qty 1

## 2022-09-25 MED ORDER — LEVOTHYROXINE SODIUM 75 MCG PO TABS
75.0000 ug | ORAL_TABLET | Freq: Every day | ORAL | Status: DC
  Administered 2022-09-26 – 2022-10-01 (×6): 75 ug via ORAL
  Filled 2022-09-25 (×6): qty 1

## 2022-09-25 MED ORDER — ACETAMINOPHEN 650 MG RE SUPP: 650.0000 mg | Freq: Four times a day (QID) | RECTAL | Status: DC | PRN

## 2022-09-25 MED ORDER — FUROSEMIDE 10 MG/ML IJ SOLN: 40.0000 mg | Freq: Once | INTRAMUSCULAR | Status: DC

## 2022-09-25 MED ORDER — METOPROLOL SUCCINATE ER 25 MG PO TB24
25.0000 mg | ORAL_TABLET | Freq: Every day | ORAL | Status: DC
  Administered 2022-09-26: 25 mg via ORAL
  Filled 2022-09-25: qty 1

## 2022-09-25 MED ORDER — ACETAMINOPHEN 325 MG PO TABS
650.0000 mg | ORAL_TABLET | Freq: Four times a day (QID) | ORAL | Status: DC | PRN
  Administered 2022-09-30: 650 mg via ORAL
  Filled 2022-09-25: qty 2

## 2022-09-25 MED ORDER — SODIUM CHLORIDE 0.9% FLUSH: 3.0000 mL | INTRAVENOUS | Status: DC | PRN

## 2022-09-25 MED ORDER — PANTOPRAZOLE SODIUM 40 MG IV SOLR
40.0000 mg | Freq: Two times a day (BID) | INTRAVENOUS | Status: DC
  Administered 2022-09-28 – 2022-09-29 (×3): 40 mg via INTRAVENOUS
  Filled 2022-09-25 (×3): qty 10

## 2022-09-25 MED ORDER — ONDANSETRON HCL 4 MG PO TABS: 4.0000 mg | ORAL_TABLET | Freq: Four times a day (QID) | ORAL | Status: DC | PRN

## 2022-09-25 MED ORDER — SODIUM CHLORIDE 0.9% IV SOLUTION: Freq: Once | INTRAVENOUS | Status: AC

## 2022-09-25 NOTE — Assessment & Plan Note (Signed)
Likely secondary to hypovolemia from acute GI bleed Trend

## 2022-09-25 NOTE — Consult Note (Signed)
Consultation Note   Referring Provider:  Emergency Services PCP: [place], Blondell Reveal, MD Primary Gastroenterologist: Silvano Rusk, MD        Reason for consultation: GI bleed  Hospital Day: 1   ASSESSMENT   89 yo female with the following:   GI bleed on Eliquis.  Not entirely clear if this is upper vrs lower. Dark stools ( ? Maybe for a few weeks) / also with bright red blood per rectum last night. BUN is elevated out of proportion to creatinine ( 83 / 1.87) raising suspicion for an upper GI bleed . The rectal bleeding may have been hemorrhoidal. She is s/p internal hemorrhoid banding on 09/04/22 . Could have ulcers associated with banding but doesn't explain the dark stool.  Not actively bleeding. Hemodynamically stable. No blood in vault on DRE.  Profound anemia of acute blood loss.  Hgb 3.9, down from baseline of 12 She has gotten at one of the 3 u of PRBCs ordered.   Afib on Eliquis.  Last dose was last evening.   AKI on CKD3b  Elevated troponin No chest pain. Demand ischemia? EKG not suggestive of ACS  # Chronic diastolic heart failure.  Pitting edema of legs, elevated BNP. No pulmonary edema on CXR. Admitting team continuing home lasix.   PLAN   -Blood transfusions underway. Await post transfusion hgb.  -Eliquis is on hold -Keep NPO for now. -May need EGD to rule out upper source of bleeding. Possible flexible sigmoidoscopy as well to evaluate rectal bleeding / recent hemorrhoid banding.   History of Present Illness   Patient is a 89 y.o. year old female with a past medical history of  hemorrhoids, s/p multiple bandings, Afib on Eliquis, chronic diastolic heart failure, [id number], hypothyroidism   See PMH for any additional medical problems.  Patient brought to ED by EMS today for evaluation of weakness. She is hemodynamically stable. [place] endorses darker than usual stools, ? Maybe for a few weeks. Last night she passed  some bright red blood. Had hemorrhoidal banding on 2/22 and had not had any bright red blood per rectum since then until last night. She has no rectal pain.  No abdominal pain.  Denies NSAID use. Last dose of Eliquis was last night. No chest pain. She has had sob on exertion.   ? use of NSAIDS. Per  admitting H+P she does take NSAIDS but denies use to me  Patient lives alone   Labs / imaging notable for:  Hgb 3.9, down from 12.2 in October WBC normal.  BUN 83, Creatinine 1.87 BNP 467 Troponin 25 >> 26 CXR - no pulmonary edema  Previous GI History / Evaluation :   Last colonoscopy was in 2004  Multiple bandings of internal hemorrhoids, last time was 09/04/22  Recent Labs    09/25/22 0230  WBC 6.5  HGB 3.9*  HCT 13.4*  PLT 263   Recent Labs    09/25/22 0230  NA 137  K 4.0  CL 106  CO2 14*  GLUCOSE 125*  BUN 83*  CREATININE 1.87*  CALCIUM 8.9   Recent Labs    09/25/22 0230  PROT 5.0*  ALBUMIN 3.2*  AST 45*  ALT 35  ALKPHOS 31*  BILITOT  0.8   No results for input(s): "HEPBSAG", "HCVAB", "HEPAIGM", "HEPBIGM" in the last 72 hours. No results for input(s): "LABPROT", "INR" in the last 72 hours.  Past Medical History:  Diagnosis Date   AKI (acute kidney injury) ([place])    Atrial fibrillation ([place])    BREAST BIOPSY, HX OF 11/27/2006   CHF (congestive heart failure) ([place])    COLONIC POLYPS, HX OF 11/27/2006   HEMORRHOIDS 01/17/2010   HYPERTENSION 11/27/2006   Hypertensive retinopathy    OU   HYPOTHYROIDISM 11/27/2006   Macular degeneration    Dry OU   MENOPAUSAL SYNDROME 11/27/2006   Vascular disease     Past Surgical History:  Procedure Laterality Date   ABDOMINAL HYSTERECTOMY     APPENDECTOMY     BREAST EXCISIONAL BIOPSY Right 1987   BREAST SURGERY     bx   CARDIOVERSION N/A 04/16/2022   Procedure: CARDIOVERSION;  Surgeon: [name], MD;  Location: [facility] ENDOSCOPY;  Service: Cardiovascular;  Laterality: N/A;   CATARACT EXTRACTION Bilateral     EYE SURGERY Bilateral    Cat Sx OU   HEMORRHOID BANDING     TONSILLECTOMY      Family History  Problem Relation Age of Onset   Heart disease Father    Colon cancer Neg Hx    Esophageal cancer Neg Hx    Pancreatic cancer Neg Hx    Liver cancer Neg Hx    Stomach cancer Neg Hx     Prior to Admission medications   Medication Sig Start Date End Date Taking? Authorizing Provider  amiodarone (PACERONE) 200 MG tablet Take 1 tablet (200 mg total) by mouth 2 (two) times daily for 14 days, THEN 1 tablet (200 mg total) daily. Patient taking differently: Take 1 tablet by mouth twice daily 09/22/22 10/06/23 Yes Fenton, Clint R, PA  amLODipine (NORVASC) 5 MG tablet Take 1 tablet (5 mg total) by mouth daily. 07/31/22 07/26/23 Yes Donato Heinz, MD  CLOBETASOL PROPIONATE E 0.05 % emollient cream Apply topically 2 (two) times daily. 04/15/22  Yes [provider]  dapagliflozin propanediol (FARXIGA) 10 MG TABS tablet Take 1 tablet (10 mg total) by mouth daily. 07/31/22  Yes Donato Heinz, MD  furosemide (LASIX) 40 MG tablet Take 1 tablet (40 mg total) by mouth daily. 07/31/22  Yes Donato Heinz, MD  metoprolol succinate (TOPROL-XL) 25 MG 24 hr tablet Take 1 tablet (25 mg total) by mouth daily. 07/31/22  Yes Donato Heinz, MD  potassium chloride (KLOR-CON M) 10 MEQ tablet Take 1 tablet (10 mEq total) by mouth daily. 07/31/22  Yes Donato Heinz, MD  Rivaroxaban (XARELTO) 15 MG TABS tablet Take 1 tablet (15 mg total) by mouth daily with supper. 07/31/22  Yes Donato Heinz, MD  SYNTHROID 75 MCG tablet TAKE 1 TABLET BY MOUTH DAILY Patient taking differently: Take 75 mcg by mouth daily before breakfast. 01/24/22  Yes Early, Coralee Pesa, NP  VITAMIN D PO Take 2,000 Units by mouth daily after lunch.   Yes [provider]  Bromfenac Sodium (PROLENSA) 0.07 % SOLN Place 1 drop into the left eye 4 (four) times daily. Patient not taking: Reported on  09/25/2022 11/21/21   Bernarda Caffey, MD  cloNIDine (CATAPRES) 0.1 MG tablet Take 1 tablet (0.1 mg total) by mouth 2 (two) times daily. 07/31/22   Donato Heinz, MD  prednisoLONE acetate (PRED FORTE) 1 % ophthalmic suspension INSTILL 1 DROP INTO LEFT EYE 4 TIMES A DAY Patient  not taking: Reported on 09/25/2022 04/20/22   Bernarda Caffey, MD    Current Facility-Administered Medications  Medication Dose Route Frequency Provider Last Rate Last Admin   0.9 %  sodium chloride infusion  250 mL Intravenous PRN Orma Flaming, MD       acetaminophen (TYLENOL) tablet 650 mg  650 mg Oral Q6H PRN Orma Flaming, MD       Or   acetaminophen (TYLENOL) suppository 650 mg  650 mg Rectal Q6H PRN Orma Flaming, MD       ondansetron [facility]) tablet 4 mg  4 mg Oral Q6H PRN Orma Flaming, MD       Or   ondansetron [facility]) injection 4 mg  4 mg Intravenous Q6H PRN Orma Flaming, MD       [START ON 09/28/2022] pantoprazole (PROTONIX) injection 40 mg  40 mg Intravenous Q12H Pollina, Gwenyth Allegra, MD       pantoprozole (PROTONIX) 80 mg /NS 100 mL infusion  8 mg/hr Intravenous Continuous Orpah Greek, MD 10 mL/hr at 09/25/22 0534 8 mg/hr at 09/25/22 0534   sodium chloride flush (NS) 0.9 % injection 3 mL  3 mL Intravenous Q12H Orma Flaming, MD       sodium chloride flush (NS) 0.9 % injection 3 mL  3 mL Intravenous PRN Orma Flaming, MD       Current Outpatient Medications  Medication Sig Dispense Refill   amiodarone (PACERONE) 200 MG tablet Take 1 tablet (200 mg total) by mouth 2 (two) times daily for 14 days, THEN 1 tablet (200 mg total) daily. (Patient taking differently: Take 1 tablet by mouth twice daily) 50 tablet 0   amLODipine (NORVASC) 5 MG tablet Take 1 tablet (5 mg total) by mouth daily. 90 tablet 3   CLOBETASOL PROPIONATE E 0.05 % emollient cream Apply topically 2 (two) times daily.     dapagliflozin propanediol (FARXIGA) 10 MG TABS tablet Take 1 tablet (10 mg total) by mouth daily. 90  tablet 3   furosemide (LASIX) 40 MG tablet Take 1 tablet (40 mg total) by mouth daily. 90 tablet 3   metoprolol succinate (TOPROL-XL) 25 MG 24 hr tablet Take 1 tablet (25 mg total) by mouth daily. 90 tablet 3   potassium chloride (KLOR-CON M) 10 MEQ tablet Take 1 tablet (10 mEq total) by mouth daily. 90 tablet 3   Rivaroxaban (XARELTO) 15 MG TABS tablet Take 1 tablet (15 mg total) by mouth daily with supper. 90 tablet 1   SYNTHROID 75 MCG tablet TAKE 1 TABLET BY MOUTH DAILY (Patient taking differently: Take 75 mcg by mouth daily before breakfast.) 90 tablet 3   VITAMIN D PO Take 2,000 Units by mouth daily after lunch.     Bromfenac Sodium (PROLENSA) 0.07 % SOLN Place 1 drop into the left eye 4 (four) times daily. (Patient not taking: Reported on 09/25/2022) 3 mL 3   cloNIDine (CATAPRES) 0.1 MG tablet Take 1 tablet (0.1 mg total) by mouth 2 (two) times daily. 180 tablet 3   prednisoLONE acetate (PRED FORTE) 1 % ophthalmic suspension INSTILL 1 DROP INTO LEFT EYE 4 TIMES A DAY (Patient not taking: Reported on 09/25/2022) 15 mL 0    Allergies as of 09/25/2022 - Review Complete 09/25/2022  Allergen Reaction Noted   Codeine phosphate Nausea And Vomiting 11/27/2006    Social History   Socioeconomic History   Marital status: Widowed    Spouse name: Not on file   Number of children: Not on file  Years of education: Not on file   Highest education level: Not on file  Occupational History   Not on file  Tobacco Use   Smoking status: Never   Smokeless tobacco: Never   Tobacco comments:    Never smoke 09/22/22  Vaping Use   Vaping Use: Never used  Substance and Sexual Activity   Alcohol use: Never    Comment: rarely   Drug use: No   Sexual activity: Not Currently  Other Topics Concern   Not on file  Social History Narrative   The patient is retired she has been in [place] for more than 50 years she has 3 sons that she is widowed.   Never tobacco, no alcohol, 2 caffeinated beverages  daily no drugs   Social Determinants of Health   Financial Resource Strain: Low Risk  (08/19/2022)   Overall Financial Resource Strain (CARDIA)    Difficulty of Paying Living Expenses: Not hard at all  Food Insecurity: No Food Insecurity (08/19/2022)   Hunger Vital Sign    Worried About Running Out of Food in the Last Year: Never true    Ran Out of Food in the Last Year: Never true  Transportation Needs: No Transportation Needs (08/19/2022)   PRAPARE - Hydrologist (Medical): No    Lack of Transportation (Non-Medical): No  Physical Activity: Inactive (08/19/2022)   Exercise Vital Sign    Days of Exercise per Week: 0 days    Minutes of Exercise per Session: 0 min  Stress: No Stress Concern Present (08/19/2022)   [place]    Feeling of Stress : Not at all  Social Connections: Moderately Integrated (08/19/2022)   Social Connection and Isolation Panel [NHANES]    Frequency of Communication with Friends and Family: More than three times a week    Frequency of Social Gatherings with Friends and Family: More than three times a week    Attends Religious Services: More than 4 times per year    Active Member of [employer] or Organizations: Yes    Attends Archivist Meetings: More than 4 times per year    Marital Status: Widowed  Intimate Partner Violence: Not At Risk (08/19/2022)   Humiliation, Afraid, Rape, and Kick questionnaire    Fear of Current or Ex-Partner: No    Emotionally Abused: No    Physically Abused: No    Sexually Abused: No    Review of Systems: All systems reviewed and negative except where noted in HPI.  Physical Exam: Vital signs in last 24 hours: Temp:  [97.5 F (36.4 C)-98.2 F (36.8 C)] 97.6 F (36.4 C) (03/14 0655) Pulse Rate:  [59-106] 73 (03/14 0802) Resp:  [13-31] 25 (03/14 0802) BP: (106-154)/(59-92) 154/92 (03/14 0800) SpO2:  [91 %-100 %] 91 % (03/14 0802)     General:  Alert female in NAD Psych:  Pleasant, cooperative. Normal mood and affect Eyes: Pupils equal Ears:  Normal auditory acuity Nose: No deformity, discharge or lesions Neck:  Supple, no masses felt Lungs:  Clear to auscultation.  Heart:  Regular rate, irregular rhythm.  Abdomen:  Soft, mildly distended, nontender, active bowel sounds, no masses felt Rectal :  no stool or blood in vault. No significant discomfort with DRE Msk: Symmetrical without gross deformities.  Neurologic:  Alert, oriented, grossly normal neurologically Extremities : pitting edema of BLE Skin:  Intact without significant lesions.    Intake/Output from previous day: 03/13 0701 -  03/14 0700 In: 332 [I.V.:20; Blood:312] Out: -  Intake/Output this shift:  No intake/output data recorded.    Principal Problem:   GI bleed Active Problems:   Hypothyroidism   Essential hypertension   Atrial fibrillation (HCC)   Chronic diastolic CHF (congestive heart failure) (HCC)   Elevated troponin   Acute renal failure superimposed on stage 3b chronic kidney disease (HCC)   Lactic acidosis    Tye Savoy, NP-C @  09/25/2022, 9:46 AM

## 2022-09-25 NOTE — H&P (View-Only) (Signed)
                                             Consultation Note   Referring Provider:  Emergency Services PCP: de [place], Raymond J, MD Primary Gastroenterologist: Carl Gessner, MD        Reason for consultation: GI bleed  Hospital Day: 1   ASSESSMENT   89 yo female with the following:   GI bleed on Eliquis.  Not entirely clear if this is upper vrs lower. Dark stools ( ? Maybe for a few weeks) / also with bright red blood per rectum last night. BUN is elevated out of proportion to creatinine ( 83 / 1.87) raising suspicion for an upper GI bleed . The rectal bleeding may have been hemorrhoidal. She is s/p internal hemorrhoid banding on 09/04/22 . Could have ulcers associated with banding but doesn't explain the dark stool.  Not actively bleeding. Hemodynamically stable. No blood in vault on DRE.  Profound anemia of acute blood loss.  Hgb 3.9, down from baseline of 12 She has gotten at one of the 3 u of PRBCs ordered.   Afib on Eliquis.  Last dose was last evening.   AKI on CKD3b  Elevated troponin No chest pain. Demand ischemia? EKG not suggestive of ACS  # Chronic diastolic heart failure.  Pitting edema of legs, elevated BNP. No pulmonary edema on CXR. Admitting team continuing home lasix.   PLAN   -Blood transfusions underway. Await post transfusion hgb.  -Eliquis is on hold -Keep NPO for now. -May need EGD to rule out upper source of bleeding. Possible flexible sigmoidoscopy as well to evaluate rectal bleeding / recent hemorrhoid banding.   History of Present Illness   Patient is a 89 y.o. year old female with a past medical history of  hemorrhoids, s/p multiple bandings, Afib on Eliquis, chronic diastolic heart failure, CKD3b, hypothyroidism   See PMH for any additional medical problems.  Patient brought to ED by EMS today for evaluation of weakness. She is hemodynamically stable. Loribeth endorses darker than usual stools, ? Maybe for a few weeks. Last night she passed  some bright red blood. Had hemorrhoidal banding on 2/22 and had not had any bright red blood per rectum since then until last night. She has no rectal pain.  No abdominal pain.  Denies NSAID use. Last dose of Eliquis was last night. No chest pain. She has had sob on exertion.   ? use of NSAIDS. Per  admitting H+P she does take NSAIDS but denies use to me  Patient lives alone   Labs / imaging notable for:  Hgb 3.9, down from 12.2 in October WBC normal.  BUN 83, Creatinine 1.87 BNP 467 Troponin 25 >> 26 CXR - no pulmonary edema  Previous GI History / Evaluation :   Last colonoscopy was in 2004  Multiple bandings of internal hemorrhoids, last time was 09/04/22  Recent Labs    09/25/22 0230  WBC 6.5  HGB 3.9*  HCT 13.4*  PLT 263   Recent Labs    09/25/22 0230  NA 137  K 4.0  CL 106  CO2 14*  GLUCOSE 125*  BUN 83*  CREATININE 1.87*  CALCIUM 8.9   Recent Labs    09/25/22 0230  PROT 5.0*  ALBUMIN 3.2*  AST 45*  ALT 35  ALKPHOS 31*  BILITOT   0.8   No results for input(s): "HEPBSAG", "HCVAB", "HEPAIGM", "HEPBIGM" in the last 72 hours. No results for input(s): "LABPROT", "INR" in the last 72 hours.  Past Medical History:  Diagnosis Date   AKI (acute kidney injury) (HCC)    Atrial fibrillation (HCC)    BREAST BIOPSY, HX OF 11/27/2006   CHF (congestive heart failure) (HCC)    COLONIC POLYPS, HX OF 11/27/2006   HEMORRHOIDS 01/17/2010   HYPERTENSION 11/27/2006   Hypertensive retinopathy    OU   HYPOTHYROIDISM 11/27/2006   Macular degeneration    Dry OU   MENOPAUSAL SYNDROME 11/27/2006   Vascular disease     Past Surgical History:  Procedure Laterality Date   ABDOMINAL HYSTERECTOMY     APPENDECTOMY     BREAST EXCISIONAL BIOPSY Right 1987   BREAST SURGERY     bx   CARDIOVERSION N/A 04/16/2022   Procedure: CARDIOVERSION;  Surgeon: Acharya, Gayatri A, MD;  Location: [facility];  Service: Cardiovascular;  Laterality: N/A;   CATARACT EXTRACTION Bilateral     EYE SURGERY Bilateral    Cat Sx OU   HEMORRHOID BANDING     TONSILLECTOMY      Family History  Problem Relation Age of Onset   Heart disease Father    Colon cancer Neg Hx    Esophageal cancer Neg Hx    Pancreatic cancer Neg Hx    Liver cancer Neg Hx    Stomach cancer Neg Hx     Prior to Admission medications   Medication Sig Start Date End Date Taking? Authorizing Provider  amiodarone (PACERONE) 200 MG tablet Take 1 tablet (200 mg total) by mouth 2 (two) times daily for 14 days, THEN 1 tablet (200 mg total) daily. Patient taking differently: Take 1 tablet by mouth twice daily 09/22/22 10/06/23 Yes Fenton, Clint R, PA  amLODipine (NORVASC) 5 MG tablet Take 1 tablet (5 mg total) by mouth daily. 07/31/22 07/26/23 Yes Schumann, Christopher L, MD  CLOBETASOL PROPIONATE E 0.05 % emollient cream Apply topically 2 (two) times daily. 04/15/22  Yes [provider]  dapagliflozin propanediol (FARXIGA) 10 MG TABS tablet Take 1 tablet (10 mg total) by mouth daily. 07/31/22  Yes Schumann, Christopher L, MD  furosemide (LASIX) 40 MG tablet Take 1 tablet (40 mg total) by mouth daily. 07/31/22  Yes Schumann, Christopher L, MD  metoprolol succinate (TOPROL-XL) 25 MG 24 hr tablet Take 1 tablet (25 mg total) by mouth daily. 07/31/22  Yes Schumann, Christopher L, MD  potassium chloride (KLOR-CON M) 10 MEQ tablet Take 1 tablet (10 mEq total) by mouth daily. 07/31/22  Yes Schumann, Christopher L, MD  Rivaroxaban (XARELTO) 15 MG TABS tablet Take 1 tablet (15 mg total) by mouth daily with supper. 07/31/22  Yes Schumann, Christopher L, MD  SYNTHROID 75 MCG tablet TAKE 1 TABLET BY MOUTH DAILY Patient taking differently: Take 75 mcg by mouth daily before breakfast. 01/24/22  Yes Early, Sara E, NP  VITAMIN D PO Take 2,000 Units by mouth daily after lunch.   Yes [provider]  Bromfenac Sodium (PROLENSA) 0.07 % SOLN Place 1 drop into the left eye 4 (four) times daily. Patient not taking: Reported on  09/25/2022 11/21/21   Zamora, Brian, MD  cloNIDine (CATAPRES) 0.1 MG tablet Take 1 tablet (0.1 mg total) by mouth 2 (two) times daily. 07/31/22   Schumann, Christopher L, MD  prednisoLONE acetate (PRED FORTE) 1 % ophthalmic suspension INSTILL 1 DROP INTO LEFT EYE 4 TIMES A DAY Patient   not taking: Reported on 09/25/2022 04/20/22   Zamora, Brian, MD    Current Facility-Administered Medications  Medication Dose Route Frequency Provider Last Rate Last Admin   0.9 %  sodium chloride infusion  250 mL Intravenous PRN Wolfe, Allison, MD       acetaminophen (TYLENOL) tablet 650 mg  650 mg Oral Q6H PRN Wolfe, Allison, MD       Or   acetaminophen (TYLENOL) suppository 650 mg  650 mg Rectal Q6H PRN Wolfe, Allison, MD       ondansetron (ZOFRAN) tablet 4 mg  4 mg Oral Q6H PRN Wolfe, Allison, MD       Or   ondansetron (ZOFRAN) injection 4 mg  4 mg Intravenous Q6H PRN Wolfe, Allison, MD       [START ON 09/28/2022] pantoprazole (PROTONIX) injection 40 mg  40 mg Intravenous Q12H Pollina, Christopher J, MD       pantoprozole (PROTONIX) 80 mg /NS 100 mL infusion  8 mg/hr Intravenous Continuous Pollina, Christopher J, MD 10 mL/hr at 09/25/22 0534 8 mg/hr at 09/25/22 0534   sodium chloride flush (NS) 0.9 % injection 3 mL  3 mL Intravenous Q12H Wolfe, Allison, MD       sodium chloride flush (NS) 0.9 % injection 3 mL  3 mL Intravenous PRN Wolfe, Allison, MD       Current Outpatient Medications  Medication Sig Dispense Refill   amiodarone (PACERONE) 200 MG tablet Take 1 tablet (200 mg total) by mouth 2 (two) times daily for 14 days, THEN 1 tablet (200 mg total) daily. (Patient taking differently: Take 1 tablet by mouth twice daily) 50 tablet 0   amLODipine (NORVASC) 5 MG tablet Take 1 tablet (5 mg total) by mouth daily. 90 tablet 3   CLOBETASOL PROPIONATE E 0.05 % emollient cream Apply topically 2 (two) times daily.     dapagliflozin propanediol (FARXIGA) 10 MG TABS tablet Take 1 tablet (10 mg total) by mouth daily. 90  tablet 3   furosemide (LASIX) 40 MG tablet Take 1 tablet (40 mg total) by mouth daily. 90 tablet 3   metoprolol succinate (TOPROL-XL) 25 MG 24 hr tablet Take 1 tablet (25 mg total) by mouth daily. 90 tablet 3   potassium chloride (KLOR-CON M) 10 MEQ tablet Take 1 tablet (10 mEq total) by mouth daily. 90 tablet 3   Rivaroxaban (XARELTO) 15 MG TABS tablet Take 1 tablet (15 mg total) by mouth daily with supper. 90 tablet 1   SYNTHROID 75 MCG tablet TAKE 1 TABLET BY MOUTH DAILY (Patient taking differently: Take 75 mcg by mouth daily before breakfast.) 90 tablet 3   VITAMIN D PO Take 2,000 Units by mouth daily after lunch.     Bromfenac Sodium (PROLENSA) 0.07 % SOLN Place 1 drop into the left eye 4 (four) times daily. (Patient not taking: Reported on 09/25/2022) 3 mL 3   cloNIDine (CATAPRES) 0.1 MG tablet Take 1 tablet (0.1 mg total) by mouth 2 (two) times daily. 180 tablet 3   prednisoLONE acetate (PRED FORTE) 1 % ophthalmic suspension INSTILL 1 DROP INTO LEFT EYE 4 TIMES A DAY (Patient not taking: Reported on 09/25/2022) 15 mL 0    Allergies as of 09/25/2022 - Review Complete 09/25/2022  Allergen Reaction Noted   Codeine phosphate Nausea And Vomiting 11/27/2006    Social History   Socioeconomic History   Marital status: Widowed    Spouse name: Not on file   Number of children: Not on file     Years of education: Not on file   Highest education level: Not on file  Occupational History   Not on file  Tobacco Use   Smoking status: Never   Smokeless tobacco: Never   Tobacco comments:    Never smoke 09/22/22  Vaping Use   Vaping Use: Never used  Substance and Sexual Activity   Alcohol use: Never    Comment: rarely   Drug use: No   Sexual activity: Not Currently  Other Topics Concern   Not on file  Social History Narrative   The patient is retired she has been in [place] for more than 50 years she has 3 sons that she is widowed.   Never tobacco, no alcohol, 2 caffeinated beverages  daily no drugs   Social Determinants of Health   Financial Resource Strain: Low Risk  (08/19/2022)   Overall Financial Resource Strain (CARDIA)    Difficulty of Paying Living Expenses: Not hard at all  Food Insecurity: No Food Insecurity (08/19/2022)   Hunger Vital Sign    Worried About Running Out of Food in the Last Year: Never true    Ran Out of Food in the Last Year: Never true  Transportation Needs: No Transportation Needs (08/19/2022)   PRAPARE - Transportation    Lack of Transportation (Medical): No    Lack of Transportation (Non-Medical): No  Physical Activity: Inactive (08/19/2022)   Exercise Vital Sign    Days of Exercise per Week: 0 days    Minutes of Exercise per Session: 0 min  Stress: No Stress Concern Present (08/19/2022)   [employer] - Occupational Stress Questionnaire    Feeling of Stress : Not at all  Social Connections: Moderately Integrated (08/19/2022)   Social Connection and Isolation Panel [NHANES]    Frequency of Communication with Friends and Family: More than three times a week    Frequency of Social Gatherings with Friends and Family: More than three times a week    Attends Religious Services: More than 4 times per year    Active Member of Clubs or Organizations: Yes    Attends Club or Organization Meetings: More than 4 times per year    Marital Status: Widowed  Intimate Partner Violence: Not At Risk (08/19/2022)   Humiliation, Afraid, Rape, and Kick questionnaire    Fear of Current or Ex-Partner: No    Emotionally Abused: No    Physically Abused: No    Sexually Abused: No    Review of Systems: All systems reviewed and negative except where noted in HPI.  Physical Exam: Vital signs in last 24 hours: Temp:  [97.5 F (36.4 C)-98.2 F (36.8 C)] 97.6 F (36.4 C) (03/14 0655) Pulse Rate:  [59-106] 73 (03/14 0802) Resp:  [13-31] 25 (03/14 0802) BP: (106-154)/(59-92) 154/92 (03/14 0800) SpO2:  [91 %-100 %] 91 % (03/14 0802)     General:  Alert female in NAD Psych:  Pleasant, cooperative. Normal mood and affect Eyes: Pupils equal Ears:  Normal auditory acuity Nose: No deformity, discharge or lesions Neck:  Supple, no masses felt Lungs:  Clear to auscultation.  Heart:  Regular rate, irregular rhythm.  Abdomen:  Soft, mildly distended, nontender, active bowel sounds, no masses felt Rectal :  no stool or blood in vault. No significant discomfort with DRE Msk: Symmetrical without gross deformities.  Neurologic:  Alert, oriented, grossly normal neurologically Extremities : pitting edema of BLE Skin:  Intact without significant lesions.    Intake/Output from previous day: 03/13 0701 -   03/14 0700 In: 332 [I.V.:20; Blood:312] Out: -  Intake/Output this shift:  No intake/output data recorded.    Principal Problem:   GI bleed Active Problems:   Hypothyroidism   Essential hypertension   Atrial fibrillation (HCC)   Chronic diastolic CHF (congestive heart failure) (HCC)   Elevated troponin   Acute renal failure superimposed on stage 3b chronic kidney disease (HCC)   Lactic acidosis    Fallou Hulbert, NP-C @  09/25/2022, 9:46 AM     

## 2022-09-25 NOTE — ED Triage Notes (Signed)
Pt BIB EMS from home with c/o weakness and fatigue x 1 day. Pt had one black stool yesterday. Pt recently started amio for afib. Pt is pale in triage.

## 2022-09-25 NOTE — Assessment & Plan Note (Addendum)
She has worsening dyspnea on exertion and elevated BNP Legs with 3+ edema, but per patient at at her baseline Check CXR, no JVP, lips dry ? If DOE due to severe anemia vs. CHF Giving small dose of lasix in between units of blood Strict I/O Resume oral lasix when can tolerate PO vs. IV depending on volume status  Echo 12/2021 with normal EF and indeterminate diastolic function  Hold farxiga for now

## 2022-09-25 NOTE — Progress Notes (Signed)
MD aware that saw patient on [facility] that 02 sat not obtainable. Patient's fingers are cold and dusky.

## 2022-09-25 NOTE — Assessment & Plan Note (Addendum)
Rate controlled Hold xarelto  CHA2DS2-VASc score is 5  Amiodarone started and will take '200mg'$  BID until 10/07/2022  DCCV pending for April 2024  Conitnue metoprolol and amiodarone for rate control

## 2022-09-25 NOTE — Progress Notes (Signed)
New patient from the ed. Patient is  a/0 x4. MP shows atrial fib. Patient has history of atrial fib. Small raw area noted on upper abdomen. Both lower extremities with redness noted no open wound. Legs swollen looks like cellulitis. See admission assessment for further information.

## 2022-09-25 NOTE — Assessment & Plan Note (Addendum)
Continue metoprolol and amlodipine Unsure if she is on clonidine -hold for now

## 2022-09-25 NOTE — Assessment & Plan Note (Addendum)
Likely pre-renal/intra renal in setting of hypoperfusion of the kidney with acute blood loss anemia -baseline creatinine appears to be around 1.2, up to 1.87 today with BUN of 83  Receiving 3 units PRBC Giving one small dose of lasix in between 2nd and third bag UA with hgb Strict I/O Avoid nephrotoxic drugs  Hold farxiga  Trend

## 2022-09-25 NOTE — ED Notes (Signed)
ED TO INPATIENT HANDOFF REPORT  ED Nurse Name and Phone #: [phone]  S Name/Age/Gender Janice Small 89 y.o. female Room/Bed: 004C/004C  Code Status   Code Status: Full Code  Home/SNF/Other Home Patient oriented to: self, place, time, and situation Is this baseline? Yes   Triage Complete: Triage complete  Chief Complaint GI bleed [K92.2]  Triage Note Pt BIB EMS from home with c/o weakness and fatigue x 1 day. Pt had one black stool yesterday. Pt recently started amio for afib. Pt is pale in triage.   Allergies Allergies  Allergen Reactions   Codeine Phosphate Nausea And Vomiting    Level of Care/Admitting Diagnosis ED Disposition     ED Disposition  Admit   Condition  --   [place]: [place] [[id number]]  Level of Care: Progressive [102]  Admit to Progressive based on following criteria: GI, ENDOCRINE disease patients with GI bleeding, acute liver failure or pancreatitis, stable with diabetic ketoacidosis or thyrotoxicosis (hypothyroid) state.  May place patient in observation at [facility] or [facility] if equivalent level of care is available:: Yes  Covid Evaluation: Asymptomatic - no recent exposure (last 10 days) testing not required  Diagnosis: GI bleed [id number]  Admitting Physician: Dory Horn [id number]  Attending Physician: Dory Horn [id number]          B Medical/Surgery History Past Medical History:  Diagnosis Date   AKI (acute kidney injury) ([place])    Atrial fibrillation ([place])    BREAST BIOPSY, HX OF 11/27/2006   CHF (congestive heart failure) ([place])    COLONIC POLYPS, HX OF 11/27/2006   HEMORRHOIDS 01/17/2010   HYPERTENSION 11/27/2006   Hypertensive retinopathy    OU   HYPOTHYROIDISM 11/27/2006   Macular degeneration    Dry OU   MENOPAUSAL SYNDROME 11/27/2006   Vascular disease    Past Surgical History:  Procedure Laterality Date   ABDOMINAL HYSTERECTOMY     APPENDECTOMY     BREAST  EXCISIONAL BIOPSY Right 1987   BREAST SURGERY     bx   CARDIOVERSION N/A 04/16/2022   Procedure: CARDIOVERSION;  Surgeon: [name], MD;  Location: [place];  Service: Cardiovascular;  Laterality: N/A;   CATARACT EXTRACTION Bilateral    EYE SURGERY Bilateral    Cat Sx OU   HEMORRHOID BANDING     TONSILLECTOMY       A IV Location/Drains/Wounds Patient Lines/Drains/Airways Status     Active Line/Drains/Airways     Name Placement date Placement time Site Days   Peripheral IV 09/25/22 18 G Right Antecubital 09/25/22  0232  Antecubital  less than 1   Peripheral IV 09/25/22 20 G Anterior;Right Forearm 09/25/22  0430  Forearm  less than 1   External Urinary Catheter 09/25/22  0405  --  less than 1            Intake/Output Last 24 hours  Intake/Output Summary (Last 24 hours) at 09/25/2022 1530 Last data filed at 09/25/2022 1215 Gross per 24 hour  Intake 1375.33 ml  Output --  Net 1375.33 ml    Labs/Imaging Results for orders placed or performed during the hospital encounter of 09/25/22 (from the past 48 hour(s))  Type and screen [place]     Status: None (Preliminary result)   Collection Time: 09/25/22  2:28 AM  Result Value Ref Range   ABO/RH(D) O POS    Antibody Screen NEG    Sample Expiration 09/28/2022,2359    Unit  Number [id number]    Blood Component Type RED CELLS,LR    Unit division 00    Status of Unit ISSUED    Transfusion Status OK TO TRANSFUSE    Crossmatch Result Compatible    Unit Number [id number]    Blood Component Type RED CELLS,LR    Unit division 00    Status of Unit ISSUED    Transfusion Status OK TO TRANSFUSE    Crossmatch Result Compatible    Unit Number [id number]    Blood Component Type RED CELLS,LR    Unit division 00    Status of Unit ISSUED    Transfusion Status OK TO TRANSFUSE    Crossmatch Result      Compatible Performed at [facility], [place] [address], [place], [place] [address]    CBC with Differential     Status: Abnormal   Collection Time: 09/25/22  2:30 AM  Result Value Ref Range   WBC 6.5 4.0 - 10.5 K/uL   RBC 1.35 (L) 3.87 - 5.11 MIL/uL   Hemoglobin 3.9 (LL) 12.0 - 15.0 g/dL    Comment: REPEATED TO VERIFY THIS CRITICAL RESULT HAS VERIFIED AND BEEN CALLED TO CHRIS CRISCO RN BY BERTRAM TAYLOR ON 03 14 2024 AT 0320, AND HAS BEEN READ BACK.     HCT 13.4 (L) 36.0 - 46.0 %   MCV 99.3 80.0 - 100.0 fL   MCH 28.9 26.0 - 34.0 pg   MCHC 29.1 (L) 30.0 - 36.0 g/dL   RDW 17.2 (H) 11.5 - 15.5 %   Platelets 263 150 - 400 K/uL   nRBC 1.2 (H) 0.0 - 0.2 %   Neutrophils Relative % 81 %   Neutro Abs 5.3 1.7 - 7.7 K/uL   Lymphocytes Relative 11 %   Lymphs Abs 0.7 0.7 - 4.0 K/uL   Monocytes Relative 7 %   Monocytes Absolute 0.4 0.1 - 1.0 K/uL   Eosinophils Relative 0 %   Eosinophils Absolute 0.0 0.0 - 0.5 K/uL   Basophils Relative 0 %   Basophils Absolute 0.0 0.0 - 0.1 K/uL   Immature Granulocytes 1 %   Abs Immature Granulocytes 0.04 0.00 - 0.07 K/uL    Comment: Performed at [facility] [address] [place], [place] [address]  Comprehensive metabolic panel     Status: Abnormal   Collection Time: 09/25/22  2:30 AM  Result Value Ref Range   Sodium 137 135 - 145 mmol/L   Potassium 4.0 3.5 - 5.1 mmol/L   Chloride 106 98 - 111 mmol/L   CO2 14 (L) 22 - 32 mmol/L   Glucose, Bld 125 (H) 70 - 99 mg/dL    Comment: Glucose reference range applies only to samples taken after fasting for at least 8 hours.   BUN 83 (H) 8 - 23 mg/dL   Creatinine, Ser 1.87 (H) 0.44 - 1.00 mg/dL   Calcium 8.9 8.9 - 10.3 mg/dL   Total Protein 5.0 (L) 6.5 - 8.1 g/dL   Albumin 3.2 (L) 3.5 - 5.0 g/dL   AST 45 (H) 15 - 41 U/L   ALT 35 0 - 44 U/L   Alkaline Phosphatase 31 (L) 38 - 126 U/L   Total Bilirubin 0.8 0.3 - 1.2 mg/dL   GFR, Estimated 25 (L) >60 mL/min    Comment: (NOTE) Calculated using the CKD-EPI Creatinine Equation (2021)    Anion gap 17 (H) 5 - 15    Comment: Performed at  [facility],  [address] [place], [place] [address]  Troponin I (High Sensitivity)     Status: Abnormal   Collection Time: 09/25/22  2:30 AM  Result Value Ref Range   Troponin I (High Sensitivity) 25 (H) <18 ng/L    Comment: (NOTE) Elevated high sensitivity troponin I (hsTnI) values and significant  changes across serial measurements may suggest ACS but many other  chronic and acute conditions are known to elevate hsTnI results.  Refer to the "Links" section for chest pain algorithms and additional  guidance. Performed at [facility], [place] [address], [place], [place] [address]   Brain natriuretic peptide     Status: Abnormal   Collection Time: 09/25/22  2:30 AM  Result Value Ref Range   B Natriuretic Peptide 467.9 (H) 0.0 - 100.0 pg/mL    Comment: Performed at [facility] [address] [place], [place] [address]  ABO/Rh     Status: None   Collection Time: 09/25/22  3:45 AM  Result Value Ref Range   ABO/RH(D)      O POS Performed at [facility] [address], [place], [place] [address]   Troponin I (High Sensitivity)     Status: Abnormal   Collection Time: 09/25/22  3:46 AM  Result Value Ref Range   Troponin I (High Sensitivity) 26 (H) <18 ng/L    Comment: (NOTE) Elevated high sensitivity troponin I (hsTnI) values and significant  changes across serial measurements may suggest ACS but many other  chronic and acute conditions are known to elevate hsTnI results.  Refer to the "Links" section for chest pain algorithms and additional  guidance. Performed at [facility], [place] [address] [place], [place] [address]   Prepare RBC (crossmatch)     Status: None   Collection Time: 09/25/22  4:11 AM  Result Value Ref Range   Order Confirmation      ORDER PROCESSED BY BLOOD BANK Performed at [facility], [place] [address] [place], [place] [address]   Urinalysis, Routine w reflex microscopic -Urine, Clean Catch     Status: Abnormal    Collection Time: 09/25/22  6:50 AM  Result Value Ref Range   Color, Urine YELLOW YELLOW   APPearance CLEAR CLEAR   Specific Gravity, Urine 1.013 1.005 - 1.030   pH 5.0 5.0 - 8.0   Glucose, UA 50 (A) NEGATIVE mg/dL   Hgb urine dipstick LARGE (A) NEGATIVE   Bilirubin Urine NEGATIVE NEGATIVE   Ketones, ur NEGATIVE NEGATIVE mg/dL   Protein, ur NEGATIVE NEGATIVE mg/dL   Nitrite NEGATIVE NEGATIVE   Leukocytes,Ua TRACE (A) NEGATIVE   RBC / HPF 21-50 0 - 5 RBC/hpf   WBC, UA 0-5 0 - 5 WBC/hpf   Bacteria, UA NONE SEEN NONE SEEN   Squamous Epithelial / HPF 0-5 0 - 5 /HPF   Non Squamous Epithelial 0-5 (A) NONE SEEN    Comment: Performed at [facility], [address] [place], [place] [address]  POC occult blood, ED     Status: Abnormal   Collection Time: 09/25/22  7:09 AM  Result Value Ref Range   Fecal Occult Bld POSITIVE (A) NEGATIVE  CBC     Status: Abnormal   Collection Time: 09/25/22  1:04 PM  Result Value Ref Range   WBC 8.3 4.0 - 10.5 K/uL   RBC 2.88 (L) 3.87 - 5.11 MIL/uL   Hemoglobin 8.9 (L) 12.0 - 15.0 g/dL    Comment:  REPEATED TO VERIFY POST TRANSFUSION SPECIMEN    HCT 26.7 (L) 36.0 - 46.0 %   MCV 92.7 80.0 - 100.0 fL   MCH 30.9 26.0 - 34.0 pg   MCHC 33.3 30.0 - 36.0 g/dL   RDW 15.6 (H) 11.5 - 15.5 %   Platelets 188 150 - 400 K/uL   nRBC 1.5 (H) 0.0 - 0.2 %    Comment: Performed at [facility] [address] [place], [place] [address]   DG CHEST PORT 1 VIEW  Result Date: 09/25/2022 CLINICAL DATA:  Shortness of breath, atrial fibrillation EXAM: PORTABLE CHEST 1 VIEW COMPARISON:  Previous studies including the examination done on 01/09/2022 FINDINGS: Transverse diameter of heart is increased. There are no signs of pulmonary edema or focal pulmonary consolidation. There is no significant pleural effusion or pneumothorax. Degenerative changes with small bony spurs are noted in both shoulders. IMPRESSION: There are no signs of pulmonary edema or focal pulmonary  consolidation. Electronically Signed   By: Elmer Picker M.D.   On: 09/25/2022 09:40    Pending Labs Unresulted Labs (From admission, onward)     Start     Ordered   09/26/22 [id number]  Basic metabolic panel  Tomorrow morning,   R        09/25/22 0919   09/25/22 0904  CBC  Now then every 4 hours,   R (with TIMED occurrences)     Comments: Please start after last bag of blood given    09/25/22 0903            Vitals/Pain Today's Vitals   09/25/22 1215 09/25/22 1230 09/25/22 1315 09/25/22 1345  BP: (!) 141/75 (!) 140/88 (!) 140/81 (!) 141/86  Pulse:  (!) 53 (!) 43 88  Resp: '16 11 18 '$ (!) 21  Temp: 98.2 F (36.8 C)     TempSrc: Oral     SpO2: 100% (!) 56% (!) 56% 98%  PainSc:        Isolation Precautions No active isolations  Medications Medications  pantoprozole (PROTONIX) 80 mg /NS 100 mL infusion (8 mg/hr Intravenous New Bag/Given 09/25/22 0534)  pantoprazole (PROTONIX) injection 40 mg (has no administration in time range)  sodium chloride flush (NS) 0.9 % injection 3 mL (3 mLs Intravenous Given 09/25/22 0953)  sodium chloride flush (NS) 0.9 % injection 3 mL (has no administration in time range)  0.9 %  sodium chloride infusion (has no administration in time range)  acetaminophen (TYLENOL) tablet 650 mg (has no administration in time range)    Or  acetaminophen (TYLENOL) suppository 650 mg (has no administration in time range)  ondansetron (ZOFRAN) tablet 4 mg (has no administration in time range)    Or  ondansetron (ZOFRAN) injection 4 mg (has no administration in time range)  0.9 %  sodium chloride infusion (Manually program via Guardrails IV Fluids) ( Intravenous New Bag/Given 09/25/22 0640)  pantoprazole (PROTONIX) 80 mg /NS 100 mL IVPB (0 mg Intravenous Stopped 09/25/22 0533)  furosemide (LASIX) injection 20 mg (20 mg Intravenous Given 09/25/22 0944)    Mobility walks with device     Focused Assessments Cardiac Assessment Handoff:  Cardiac Rhythm: Atrial  fibrillation No results found for: "CKTOTAL", "CKMB", "CKMBINDEX", "TROPONINI" No results found for: "DDIMER" Does the Patient currently have chest pain? No    R Recommendations: See Admitting Provider Note  Report given to:   Additional Notes:

## 2022-09-25 NOTE — Assessment & Plan Note (Addendum)
89 year old female presenting with one day history of worsening fatigue, shortness of breath with exertion and lightheadness and hx of black stools since  hemorrhoid banding in 08/2022 found to have hgb of 3.4 with upper GI bleed. On AC with xarelto last took 3/13 at 7pm.  -admit to progressive -NPO -does use NSAIDs daily  -GI consulted  -protonix gtt -3 units PRBC with '20mg'$  lasix between 2nd and third bag -cbc q 4 hours -transfuse to keep hgb >8 -she is stable and GI decided not to reverse [facility] -plan for EGD today  -BUN elevated to suggest UGIB, but also recent hemorrhoid banding  -SCDs

## 2022-09-25 NOTE — Assessment & Plan Note (Signed)
TSH wnl 07/2022 Continue synthroid 48mg daily

## 2022-09-25 NOTE — H&P (Addendum)
History and Physical    Patient: Janice Small DOB: [birth year] DOA: 09/25/2022 DOS: the patient was seen and examined on 09/25/2022 PCP: [place], Blondell Reveal, MD  Patient coming from: Home - lives alone. Uses cane to ambulate.    Chief Complaint: weakness   HPI: Janice Small is a 89 y.o. female with medical history significant of atrial fibrillation on xarelto, HTN, hypothyroidism, diastolic CHF, primary hyperparathyroidism who presented to ED with complaints of weakness. She states it really started today. She had some lightheadedness before she came to ED. She has also had some episodes of black stool. Dyspnea on exertion has been worse and she isn't sure if this is due to her atrial fibrillation.  History of recurrent prolapsing bleeding hemorrhoids. She has been banded by Dr. Carlean Purl in March, August and October of 2023. Seen by Dr. Carlean Purl in 08/2022 and had another banding procedure done. Since this time she has had some dark/black stool. History of colonoscopy in 2004. She is on xarelto and last took last night around 9:00pm.    Denies any fever/chills, vision changes/headaches, chest pain or palpitations, she has some shortness of breath with exertion, no cough, abdominal pain, N/V/D, dysuria or leg swelling.   She does not smoke or drink alcohol. She does take NSAIDS once/day.   ER Course:  vitals: afebrile, bp: 106/66, HR;85, RR: 18 oxygen: 100%RA Pertinent labs: hgb: 3.9, CO2:14, BUN: 83, creatinine: 1.87, AG: 17, troponin 25>26,  BNP: 467,  No imaging In ED: 3 PRBC ordered, started on protonix gtt, GI consulted. GI decided no AC reversal needed since she is hemodynamically stable. TRH asked to admit.    Review of Systems: As mentioned in the history of present illness. All other systems reviewed and are negative. Past Medical History:  Diagnosis Date   AKI (acute kidney injury) ([place])    Atrial fibrillation ([place])    BREAST BIOPSY, HX OF 11/27/2006   CHF  (congestive heart failure) ([place])    COLONIC POLYPS, HX OF 11/27/2006   HEMORRHOIDS 01/17/2010   HYPERTENSION 11/27/2006   Hypertensive retinopathy    OU   HYPOTHYROIDISM 11/27/2006   Macular degeneration    Dry OU   MENOPAUSAL SYNDROME 11/27/2006   Vascular disease    Past Surgical History:  Procedure Laterality Date   ABDOMINAL HYSTERECTOMY     APPENDECTOMY     BREAST EXCISIONAL BIOPSY Right 1987   BREAST SURGERY     bx   CARDIOVERSION N/A 04/16/2022   Procedure: CARDIOVERSION;  Surgeon: [name], MD;  Location: [facility];  Service: Cardiovascular;  Laterality: N/A;   CATARACT EXTRACTION Bilateral    EYE SURGERY Bilateral    Cat Sx OU   HEMORRHOID BANDING     TONSILLECTOMY     Social History:  reports that she has never smoked. She has never used smokeless tobacco. She reports that she does not drink alcohol and does not use drugs.  Allergies  Allergen Reactions   Codeine Phosphate Nausea And Vomiting    Family History  Problem Relation Age of Onset   Heart disease Father    Colon cancer Neg Hx    Esophageal cancer Neg Hx    Pancreatic cancer Neg Hx    Liver cancer Neg Hx    Stomach cancer Neg Hx     Prior to Admission medications   Medication Sig Start Date End Date Taking? Authorizing Provider  amLODipine (NORVASC) 5 MG tablet Take 1 tablet (5 mg total) by mouth  daily. 07/31/22 07/26/23 Yes Donato Heinz, MD  dapagliflozin propanediol (FARXIGA) 10 MG TABS tablet Take 1 tablet (10 mg total) by mouth daily. 07/31/22  Yes Donato Heinz, MD  metoprolol succinate (TOPROL-XL) 25 MG 24 hr tablet Take 1 tablet (25 mg total) by mouth daily. 07/31/22  Yes Donato Heinz, MD  potassium chloride (KLOR-CON M) 10 MEQ tablet Take 1 tablet (10 mEq total) by mouth daily. 07/31/22  Yes Donato Heinz, MD  Rivaroxaban (XARELTO) 15 MG TABS tablet Take 1 tablet (15 mg total) by mouth daily with supper. 07/31/22  Yes Donato Heinz,  MD  SYNTHROID 75 MCG tablet TAKE 1 TABLET BY MOUTH DAILY Patient taking differently: Take 75 mcg by mouth daily before breakfast. 01/24/22  Yes Early, Coralee Pesa, NP  VITAMIN D PO Take 2,000 Units by mouth daily after lunch.   Yes [provider]  amiodarone (PACERONE) 200 MG tablet Take 1 tablet (200 mg total) by mouth 2 (two) times daily for 14 days, THEN 1 tablet (200 mg total) daily. 09/22/22 10/06/23  Fenton, Clint R, PA  Bromfenac Sodium (PROLENSA) 0.07 % SOLN Place 1 drop into the left eye 4 (four) times daily. 11/21/21   Bernarda Caffey, MD  CLOBETASOL PROPIONATE E 0.05 % emollient cream Apply topically 2 (two) times daily. 04/15/22   [provider]  cloNIDine (CATAPRES) 0.1 MG tablet Take 1 tablet (0.1 mg total) by mouth 2 (two) times daily. 07/31/22   Donato Heinz, MD  doxycycline (PERIOSTAT) 20 MG tablet Take 20 mg by mouth 2 (two) times daily. 07/10/22   [provider]  furosemide (LASIX) 40 MG tablet Take 1 tablet (40 mg total) by mouth daily. 07/31/22   Donato Heinz, MD  prednisoLONE acetate (PRED FORTE) 1 % ophthalmic suspension INSTILL 1 DROP INTO LEFT EYE 4 TIMES A DAY Patient not taking: Reported on 09/25/2022 04/20/22   Bernarda Caffey, MD    Physical Exam: Vitals:   09/25/22 1033 09/25/22 1048 09/25/22 1133 09/25/22 1215  BP: (!) 143/95 (!) 142/84 136/84 (!) 141/75  Pulse: 92 89 100   Resp: '16 16 16 16  '$ Temp: 98.5 F (36.9 C) 98.1 F (36.7 C) 98.2 F (36.8 C) 98.2 F (36.8 C)  TempSrc: Oral Oral Oral Oral  SpO2: 100% 98% 99% 100%   General:  Appears calm and comfortable and is in NAD. Pale appearing  Eyes:  PERRL, EOMI, normal lids, iris ENT:  grossly normal hearing, lips & tongue, dry mucous membranes; appropriate dentition Neck:  no LAD, masses or thyromegaly; no carotid bruits Cardiovascular:  Regularly irregular, no m/r/g. 3+ pitting edema to bilaeral LE edema.  Respiratory:   CTA bilaterally with no wheezes/rales/rhonchi.   Normal respiratory effort. Abdomen:  soft, NT, ND, NABS Back:   normal alignment, no CVAT Skin:  no rash or induration seen on limited exam Musculoskeletal:  grossly normal tone BUE/BLE, good ROM, no bony abnormality Lower extremity:  Limited foot exam with no ulcerations.  2+ distal pulses. Psychiatric:  grossly normal mood and affect, speech fluent and appropriate, AOx3 Neurologic:  CN 2-12 grossly intact, moves all extremities in coordinated fashion, sensation intact   Radiological Exams on Admission: Independently reviewed - see discussion in A/P where applicable  DG CHEST PORT 1 VIEW  Result Date: 09/25/2022 CLINICAL DATA:  Shortness of breath, atrial fibrillation EXAM: PORTABLE CHEST 1 VIEW COMPARISON:  Previous studies including the examination done on 01/09/2022 FINDINGS: Transverse diameter of heart is increased. There  are no signs of pulmonary edema or focal pulmonary consolidation. There is no significant pleural effusion or pneumothorax. Degenerative changes with small bony spurs are noted in both shoulders. IMPRESSION: There are no signs of pulmonary edema or focal pulmonary consolidation. Electronically Signed   By: Elmer Picker M.D.   On: 09/25/2022 09:40    EKG: Independently reviewed.  Atrial fibrillation with rate 88; nonspecific ST changes with no evidence of acute ischemia. PVCs   Labs on Admission: I have personally reviewed the available labs and imaging studies at the time of the admission.  Pertinent labs:   hgb: 3.9,  CO2:14,  BUN: 83,  creatinine: 1.87,  AG: 17,  troponin 25>26,  BNP: 467,  Assessment and Plan: Principal Problem:   GI bleed Active Problems:   Acute renal failure superimposed on stage 3b chronic kidney disease (HCC)   Acute blood loss anemia   Elevated troponin   Chronic diastolic CHF (congestive heart failure) (HCC)   Atrial fibrillation (HCC)   Lactic acidosis   Essential hypertension   Hypothyroidism    Assessment and  Plan: * GI bleed 89 year old female presenting with one day history of worsening fatigue, shortness of breath with exertion and lightheadness and hx of black stools since  hemorrhoid banding in 08/2022 found to have hgb of 3.4 with upper GI bleed. On AC with xarelto last took 3/13 at 7pm.  -admit to progressive -NPO -does use NSAIDs daily  -GI consulted  -protonix gtt -3 units PRBC with '20mg'$  lasix between 2nd and third bag -cbc q 4 hours -transfuse to keep hgb >8 -she is stable and GI decided not to reverse [facility] -plan for EGD today  -BUN elevated to suggest UGIB, but also recent hemorrhoid banding  -SCDs  Acute blood loss anemia Secondary to UGI bleed Transfusing 3 units PRBC Trend CBC Transfuse if hgb <8   Acute renal failure superimposed on stage 3b chronic kidney disease (HCC) Likely pre-renal/intra renal in setting of hypoperfusion of the kidney with acute blood loss anemia -baseline creatinine appears to be around 1.2, up to 1.87 today with BUN of 83  Receiving 3 units PRBC Giving one small dose of lasix in between 2nd and third bag UA with hgb Strict I/O Avoid nephrotoxic drugs  Hold farxiga  Trend   Elevated troponin Troponin flat with no chest pain or changes on EKG, not indicative of ACS Likely demand ischemia in setting of acute anemia Getting transfused 3 units PRBC Continue telemetry   Chronic diastolic CHF (congestive heart failure) ([place]) She has worsening dyspnea on exertion and elevated BNP Legs with 3+ edema, but per patient at at her baseline Check CXR, no JVP, lips dry ? If DOE due to severe anemia vs. CHF Giving small dose of lasix in between units of blood Strict I/O Resume oral lasix when can tolerate PO vs. IV depending on volume status  Echo 12/2021 with normal EF and indeterminate diastolic function  Hold farxiga for now    Lactic acidosis Likely secondary to hypovolemia from acute GI bleed Trend   Atrial fibrillation (HCC) Rate  controlled Hold xarelto  CHA2DS2-VASc score is 5  Amiodarone started and will take '200mg'$  BID until 10/07/2022  DCCV pending for April 2024  Conitnue metoprolol and amiodarone for rate control   Essential hypertension Continue metoprolol and amlodipine Unsure if she is on clonidine -hold for now   Hypothyroidism TSH wnl 07/2022 Continue synthroid 13mg daily     Advance Care Planning:  Code Status: Full Code   Consults: GI: [place]   DVT Prophylaxis: SCDs  Family Communication: updated her son by phone: Joey Croucher: [phone]  Severity of Illness: The appropriate patient status for this patient is OBSERVATION. Observation status is judged to be reasonable and necessary in order to provide the required intensity of service to ensure the patient's safety. The patient's presenting symptoms, physical exam findings, and initial radiographic and laboratory data in the context of their medical condition is felt to place them at decreased risk for further clinical deterioration. Furthermore, it is anticipated that the patient will be medically stable for discharge from the hospital within 2 midnights of admission.   Author: Orma Flaming, MD 09/25/2022 1:38 PM  For on call review [email or web address].

## 2022-09-25 NOTE — Assessment & Plan Note (Signed)
Secondary to UGI bleed Transfusing 3 units PRBC Trend CBC Transfuse if hgb <8

## 2022-09-25 NOTE — Assessment & Plan Note (Signed)
Troponin flat with no chest pain or changes on EKG, not indicative of ACS Likely demand ischemia in setting of acute anemia Getting transfused 3 units PRBC Continue telemetry

## 2022-09-25 NOTE — ED Notes (Signed)
Floor attempted to be called no answer from nurse.

## 2022-09-25 NOTE — ED Provider Notes (Signed)
[facility] Provider Note   CSN: [id number] Arrival date & time: 09/25/22  0132     History  Chief Complaint  Patient presents with   Weakness    Samina Nevel is a 89 y.o. female.  Patient comes to the ER by ambulance from home.  Patient reports that she has been feeling extremely weak and has not had an appetite today.  Patient with history of chronic atrial fibrillation, on Xarelto.  She was recently started on amiodarone.  She has had a dark stool yesterday, and now reports red blood from "hemorrhoid".       Home Medications Prior to Admission medications   Medication Sig Start Date End Date Taking? Authorizing Provider  amLODipine (NORVASC) 5 MG tablet Take 1 tablet (5 mg total) by mouth daily. 07/31/22 07/26/23 Yes Donato Heinz, MD  dapagliflozin propanediol (FARXIGA) 10 MG TABS tablet Take 1 tablet (10 mg total) by mouth daily. 07/31/22  Yes Donato Heinz, MD  metoprolol succinate (TOPROL-XL) 25 MG 24 hr tablet Take 1 tablet (25 mg total) by mouth daily. 07/31/22  Yes Donato Heinz, MD  potassium chloride (KLOR-CON M) 10 MEQ tablet Take 1 tablet (10 mEq total) by mouth daily. 07/31/22  Yes Donato Heinz, MD  Rivaroxaban (XARELTO) 15 MG TABS tablet Take 1 tablet (15 mg total) by mouth daily with supper. 07/31/22  Yes Donato Heinz, MD  SYNTHROID 75 MCG tablet TAKE 1 TABLET BY MOUTH DAILY Patient taking differently: Take 75 mcg by mouth daily before breakfast. 01/24/22  Yes Early, Coralee Pesa, NP  VITAMIN D PO Take 2,000 Units by mouth daily after lunch.   Yes [provider]  amiodarone (PACERONE) 200 MG tablet Take 1 tablet (200 mg total) by mouth 2 (two) times daily for 14 days, THEN 1 tablet (200 mg total) daily. 09/22/22 10/06/23  Fenton, Clint R, PA  Bromfenac Sodium (PROLENSA) 0.07 % SOLN Place 1 drop into the left eye 4 (four) times daily. 11/21/21   Bernarda Caffey, MD   CLOBETASOL PROPIONATE E 0.05 % emollient cream Apply topically 2 (two) times daily. 04/15/22   [provider]  cloNIDine (CATAPRES) 0.1 MG tablet Take 1 tablet (0.1 mg total) by mouth 2 (two) times daily. 07/31/22   Donato Heinz, MD  doxycycline (PERIOSTAT) 20 MG tablet Take 20 mg by mouth 2 (two) times daily. 07/10/22   [provider]  furosemide (LASIX) 40 MG tablet Take 1 tablet (40 mg total) by mouth daily. 07/31/22   Donato Heinz, MD  prednisoLONE acetate (PRED FORTE) 1 % ophthalmic suspension INSTILL 1 DROP INTO LEFT EYE 4 TIMES A DAY Patient not taking: Reported on 09/25/2022 04/20/22   Bernarda Caffey, MD      Allergies    Codeine phosphate    Review of Systems   Review of Systems  Physical Exam Updated Vital Signs BP (!) 119/59   Pulse 83   Temp 98 F (36.7 C) (Oral)   Resp (!) 26   SpO2 97%  Physical Exam Vitals and nursing note reviewed.  Constitutional:      General: She is not in acute distress.    Appearance: She is well-developed.  HENT:     Head: Normocephalic and atraumatic.     Mouth/Throat:     Mouth: Mucous membranes are moist.  Eyes:     General: Vision grossly intact. Gaze aligned appropriately.     Extraocular Movements: Extraocular movements  intact.     Conjunctiva/sclera: Conjunctivae normal.  Cardiovascular:     Rate and Rhythm: Normal rate. Rhythm irregular.     Pulses: Normal pulses.     Heart sounds: Normal heart sounds, S1 normal and S2 normal. No murmur heard.    No friction rub. No gallop.  Pulmonary:     Effort: Pulmonary effort is normal. No respiratory distress.     Breath sounds: Normal breath sounds.  Abdominal:     General: Bowel sounds are normal.     Palpations: Abdomen is soft.     Tenderness: There is no abdominal tenderness. There is no guarding or rebound.     Hernia: No hernia is present.  Musculoskeletal:        General: No swelling.     Cervical back: Full passive range of motion  without pain, normal range of motion and neck supple. No spinous process tenderness or muscular tenderness. Normal range of motion.     Right lower leg: Edema present.     Left lower leg: Edema present.  Skin:    General: Skin is warm and dry.     Capillary Refill: Capillary refill takes less than 2 seconds.     Coloration: Skin is pale.     Findings: No ecchymosis, erythema, rash or wound.  Neurological:     General: No focal deficit present.     Mental Status: She is alert and oriented to person, place, and time.     GCS: GCS eye subscore is 4. GCS verbal subscore is 5. GCS motor subscore is 6.     Cranial Nerves: Cranial nerves 2-12 are intact.     Sensory: Sensation is intact.     Motor: Motor function is intact.     Coordination: Coordination is intact.  Psychiatric:        Attention and Perception: Attention normal.        Mood and Affect: Mood normal.        Speech: Speech normal.        Behavior: Behavior normal.     ED Results / Procedures / Treatments   Labs (all labs ordered are listed, but only abnormal results are displayed) Labs Reviewed  CBC WITH DIFFERENTIAL/PLATELET - Abnormal; Notable for the following components:      Result Value   RBC 1.35 (*)    Hemoglobin 3.9 (*)    HCT 13.4 (*)    MCHC 29.1 (*)    RDW 17.2 (*)    nRBC 1.2 (*)    All other components within normal limits  COMPREHENSIVE METABOLIC PANEL - Abnormal; Notable for the following components:   CO2 14 (*)    Glucose, Bld 125 (*)    BUN 83 (*)    Creatinine, Ser 1.87 (*)    Total Protein 5.0 (*)    Albumin 3.2 (*)    AST 45 (*)    Alkaline Phosphatase 31 (*)    GFR, Estimated 25 (*)    Anion gap 17 (*)    All other components within normal limits  BRAIN NATRIURETIC PEPTIDE - Abnormal; Notable for the following components:   B Natriuretic Peptide 467.9 (*)    All other components within normal limits  TROPONIN I (HIGH SENSITIVITY) - Abnormal; Notable for the following components:    Troponin I (High Sensitivity) 25 (*)    All other components within normal limits  TROPONIN I (HIGH SENSITIVITY) - Abnormal; Notable for the following components:   Troponin I (  High Sensitivity) 26 (*)    All other components within normal limits  URINALYSIS, ROUTINE W REFLEX MICROSCOPIC  POC OCCULT BLOOD, ED  TYPE AND SCREEN  ABO/RH  PREPARE RBC (CROSSMATCH)    EKG EKG Interpretation  Date/Time:  Thursday September 25 2022 01:58:18 EDT Ventricular Rate:  86 PR Interval:    QRS Duration: 87 QT Interval:  335 QTC Calculation: 401 R Axis:   23 Text Interpretation: Atrial fibrillation Borderline low voltage, extremity leads Abnormal T, consider ischemia, lateral leads Confirmed by Orpah Greek [id number]) on 09/25/2022 2:30:17 AM  Radiology No results found.  Procedures Procedures    Medications Ordered in ED Medications  0.9 %  sodium chloride infusion (Manually program via Guardrails IV Fluids) (has no administration in time range)  pantoprazole (PROTONIX) 80 mg /NS 100 mL IVPB (80 mg Intravenous New Bag/Given 09/25/22 0511)  pantoprozole (PROTONIX) 80 mg /NS 100 mL infusion (has no administration in time range)  pantoprazole (PROTONIX) injection 40 mg (has no administration in time range)    ED Course/ Medical Decision Making/ A&P                             Medical Decision Making Amount and/or Complexity of Data Reviewed External Data Reviewed: labs and notes. Labs: ordered. Decision-making details documented in ED Course. Radiology: ordered and independent interpretation performed. Decision-making details documented in ED Course. ECG/medicine tests: ordered and independent interpretation performed. Decision-making details documented in ED Course.  Risk Prescription drug management. Decision regarding hospitalization.   Patient presents to the emerged department for evaluation and generalized weakness.  Differential diagnosis for generalized weakness is  vast.  Patient extremely pale on initial evaluation, anemia suspected.  Patient reports that she has a history of hemorrhoids and she has been noticing some bleeding.  CBC shows marked anemia, hemoglobin 3.9.  She is compensated, not hypotensive.  Rectal exam revealed very dark stool mixed with maroon blood.  Her chemistry, however, shows significant elevation of BUN.  Difficult to differentiate upper versus diverticular bleed.  Will cover with Protonix bolus and drip.  Patient is on Xarelto secondary to history of atrial fibrillation.  Suspect that she is exhibiting some acute on chronic anemia because she has not tachycardia or hypotensive despite profound anemia.  She has probably drifted down slowly over the last month or so, initially from her bleeding hemorrhoids.  Unable to reach GI on-call, secure chat message sent so she will be seen promptly this morning.  Pharmacy consultation to consider reversal, as she is hemodynamically stable, watch very closely, reverse if there are any changes in her vital signs.  Infusion has been initiated, will admit to hospitalist service for further monitoring, treatment and evaluation.  CRITICAL CARE Performed by: Orpah Greek   Total critical care time: 35 minutes  Critical care time was exclusive of separately billable procedures and treating other patients.  Critical care was necessary to treat or prevent imminent or life-threatening deterioration.  Critical care was time spent personally by me on the following activities: development of treatment plan with patient and/or surrogate as well as nursing, discussions with consultants, evaluation of patient's response to treatment, examination of patient, obtaining history from patient or surrogate, ordering and performing treatments and interventions, ordering and review of laboratory studies, ordering and review of radiographic studies, pulse oximetry and re-evaluation of patient's  condition.            Final Clinical Impression(s) /  ED Diagnoses Final diagnoses:  Gastrointestinal hemorrhage, unspecified gastrointestinal hemorrhage type    Rx / DC Orders ED Discharge Orders     None         Anzley Dibbern, Gwenyth Allegra, MD 09/25/22 [phone]

## 2022-09-26 ENCOUNTER — Inpatient Hospital Stay (HOSPITAL_COMMUNITY): Payer: Medicare Other | Admitting: Anesthesiology

## 2022-09-26 ENCOUNTER — Encounter (HOSPITAL_COMMUNITY)
Admission: EM | Disposition: A | Discharge status: Skilled Nursing Facility | Payer: Self-pay | Source: Home / Self Care | Attending: Internal Medicine

## 2022-09-26 ENCOUNTER — Encounter (HOSPITAL_COMMUNITY): Payer: Self-pay

## 2022-09-26 DIAGNOSIS — K922 Gastrointestinal hemorrhage, unspecified: Secondary | ICD-10-CM | POA: Diagnosis present

## 2022-09-26 DIAGNOSIS — K5730 Diverticulosis of large intestine without perforation or abscess without bleeding: Secondary | ICD-10-CM

## 2022-09-26 DIAGNOSIS — I4819 Other persistent atrial fibrillation: Secondary | ICD-10-CM | POA: Diagnosis not present

## 2022-09-26 DIAGNOSIS — K625 Hemorrhage of anus and rectum: Secondary | ICD-10-CM | POA: Diagnosis present

## 2022-09-26 DIAGNOSIS — E860 Dehydration: Secondary | ICD-10-CM | POA: Diagnosis present

## 2022-09-26 DIAGNOSIS — D6832 Hemorrhagic disorder due to extrinsic circulating anticoagulants: Secondary | ICD-10-CM | POA: Diagnosis present

## 2022-09-26 DIAGNOSIS — K2971 Gastritis, unspecified, with bleeding: Secondary | ICD-10-CM | POA: Diagnosis present

## 2022-09-26 DIAGNOSIS — K31819 Angiodysplasia of stomach and duodenum without bleeding: Secondary | ICD-10-CM | POA: Diagnosis not present

## 2022-09-26 DIAGNOSIS — N189 Chronic kidney disease, unspecified: Secondary | ICD-10-CM

## 2022-09-26 DIAGNOSIS — I5032 Chronic diastolic (congestive) heart failure: Secondary | ICD-10-CM | POA: Diagnosis present

## 2022-09-26 DIAGNOSIS — I2489 Other forms of acute ischemic heart disease: Secondary | ICD-10-CM | POA: Diagnosis present

## 2022-09-26 DIAGNOSIS — E876 Hypokalemia: Secondary | ICD-10-CM | POA: Diagnosis not present

## 2022-09-26 DIAGNOSIS — R339 Retention of urine, unspecified: Secondary | ICD-10-CM | POA: Diagnosis present

## 2022-09-26 DIAGNOSIS — K5520 Angiodysplasia of colon without hemorrhage: Secondary | ICD-10-CM | POA: Diagnosis not present

## 2022-09-26 DIAGNOSIS — D62 Acute posthemorrhagic anemia: Secondary | ICD-10-CM | POA: Diagnosis present

## 2022-09-26 DIAGNOSIS — K31811 Angiodysplasia of stomach and duodenum with bleeding: Secondary | ICD-10-CM | POA: Diagnosis present

## 2022-09-26 DIAGNOSIS — E872 Acidosis, unspecified: Secondary | ICD-10-CM | POA: Diagnosis present

## 2022-09-26 DIAGNOSIS — K642 Third degree hemorrhoids: Secondary | ICD-10-CM

## 2022-09-26 DIAGNOSIS — N179 Acute kidney failure, unspecified: Secondary | ICD-10-CM | POA: Diagnosis present

## 2022-09-26 DIAGNOSIS — K2970 Gastritis, unspecified, without bleeding: Secondary | ICD-10-CM

## 2022-09-26 DIAGNOSIS — Z8249 Family history of ischemic heart disease and other diseases of the circulatory system: Secondary | ICD-10-CM | POA: Diagnosis not present

## 2022-09-26 DIAGNOSIS — K5521 Angiodysplasia of colon with hemorrhage: Secondary | ICD-10-CM | POA: Diagnosis present

## 2022-09-26 DIAGNOSIS — K921 Melena: Secondary | ICD-10-CM | POA: Diagnosis not present

## 2022-09-26 DIAGNOSIS — D500 Iron deficiency anemia secondary to blood loss (chronic): Secondary | ICD-10-CM

## 2022-09-26 DIAGNOSIS — K2289 Other specified disease of esophagus: Secondary | ICD-10-CM | POA: Diagnosis not present

## 2022-09-26 DIAGNOSIS — I482 Chronic atrial fibrillation, unspecified: Secondary | ICD-10-CM | POA: Diagnosis present

## 2022-09-26 DIAGNOSIS — E861 Hypovolemia: Secondary | ICD-10-CM | POA: Diagnosis present

## 2022-09-26 DIAGNOSIS — E039 Hypothyroidism, unspecified: Secondary | ICD-10-CM | POA: Diagnosis present

## 2022-09-26 DIAGNOSIS — K5731 Diverticulosis of large intestine without perforation or abscess with bleeding: Secondary | ICD-10-CM | POA: Diagnosis present

## 2022-09-26 DIAGNOSIS — K449 Diaphragmatic hernia without obstruction or gangrene: Secondary | ICD-10-CM

## 2022-09-26 DIAGNOSIS — E210 Primary hyperparathyroidism: Secondary | ICD-10-CM | POA: Diagnosis present

## 2022-09-26 DIAGNOSIS — N1832 Chronic kidney disease, stage 3b: Secondary | ICD-10-CM | POA: Diagnosis present

## 2022-09-26 DIAGNOSIS — K259 Gastric ulcer, unspecified as acute or chronic, without hemorrhage or perforation: Secondary | ICD-10-CM

## 2022-09-26 DIAGNOSIS — E870 Hyperosmolality and hypernatremia: Secondary | ICD-10-CM | POA: Diagnosis present

## 2022-09-26 DIAGNOSIS — K254 Chronic or unspecified gastric ulcer with hemorrhage: Secondary | ICD-10-CM | POA: Diagnosis present

## 2022-09-26 DIAGNOSIS — I509 Heart failure, unspecified: Secondary | ICD-10-CM

## 2022-09-26 DIAGNOSIS — I130 Hypertensive heart and chronic kidney disease with heart failure and stage 1 through stage 4 chronic kidney disease, or unspecified chronic kidney disease: Secondary | ICD-10-CM

## 2022-09-26 HISTORY — PX: ESOPHAGOGASTRODUODENOSCOPY: SHX5428

## 2022-09-26 HISTORY — PX: BIOPSY: SHX5522

## 2022-09-26 HISTORY — PX: FLEXIBLE SIGMOIDOSCOPY: SHX5431

## 2022-09-26 HISTORY — PX: HOT HEMOSTASIS: SHX5433

## 2022-09-26 HISTORY — PX: ESOPHAGEAL BRUSHING: SHX6842

## 2022-09-26 LAB — POCT I-STAT, CHEM 8
BUN: 38 mg/dL — ABNORMAL HIGH (ref 8–23)
Calcium, Ion: 1.15 mmol/L (ref 1.15–1.40)
Chloride: 112 mmol/L — ABNORMAL HIGH (ref 98–111)
Creatinine, Ser: 1.2 mg/dL — ABNORMAL HIGH (ref 0.44–1.00)
Glucose, Bld: 107 mg/dL — ABNORMAL HIGH (ref 70–99)
HCT: 27 % — ABNORMAL LOW (ref 36.0–46.0)
Hemoglobin: 9.2 g/dL — ABNORMAL LOW (ref 12.0–15.0)
Potassium: 2.7 mmol/L — CL (ref 3.5–5.1)
Sodium: 151 mmol/L — ABNORMAL HIGH (ref 135–145)
TCO2: 21 mmol/L — ABNORMAL LOW (ref 22–32)

## 2022-09-26 LAB — CBC
HCT: 26 % — ABNORMAL LOW (ref 36.0–46.0)
HCT: 26.3 % — ABNORMAL LOW (ref 36.0–46.0)
Hemoglobin: 8.6 g/dL — ABNORMAL LOW (ref 12.0–15.0)
Hemoglobin: 8.4 g/dL — ABNORMAL LOW (ref 12.0–15.0)
MCH: 30.2 pg (ref 26.0–34.0)
MCH: 29.8 pg (ref 26.0–34.0)
MCHC: 33.1 g/dL (ref 30.0–36.0)
MCHC: 31.9 g/dL (ref 30.0–36.0)
MCV: 91.2 fL (ref 80.0–100.0)
MCV: 93.3 fL (ref 80.0–100.0)
Platelets: 192 10*3/uL (ref 150–400)
Platelets: 202 10*3/uL (ref 150–400)
RBC: 2.85 MIL/uL — ABNORMAL LOW (ref 3.87–5.11)
RBC: 2.82 MIL/uL — ABNORMAL LOW (ref 3.87–5.11)
RDW: 16.1 % — ABNORMAL HIGH (ref 11.5–15.5)
RDW: 16.3 % — ABNORMAL HIGH (ref 11.5–15.5)
WBC: 8.5 10*3/uL (ref 4.0–10.5)
WBC: 9.6 10*3/uL (ref 4.0–10.5)
nRBC: 1.9 % — ABNORMAL HIGH (ref 0.0–0.2)
nRBC: 2 % — ABNORMAL HIGH (ref 0.0–0.2)

## 2022-09-26 LAB — BASIC METABOLIC PANEL
Anion gap: 10 (ref 5–15)
Anion gap: 12 (ref 5–15)
BUN: 44 mg/dL — ABNORMAL HIGH (ref 8–23)
BUN: 37 mg/dL — ABNORMAL HIGH (ref 8–23)
CO2: 22 mmol/L (ref 22–32)
CO2: 21 mmol/L — ABNORMAL LOW (ref 22–32)
Calcium: 8.3 mg/dL — ABNORMAL LOW (ref 8.9–10.3)
Calcium: 8.1 mg/dL — ABNORMAL LOW (ref 8.9–10.3)
Chloride: 116 mmol/L — ABNORMAL HIGH (ref 98–111)
Chloride: 115 mmol/L — ABNORMAL HIGH (ref 98–111)
Creatinine, Ser: 1.18 mg/dL — ABNORMAL HIGH (ref 0.44–1.00)
Creatinine, Ser: 1.16 mg/dL — ABNORMAL HIGH (ref 0.44–1.00)
GFR, Estimated: 43 mL/min — ABNORMAL LOW (ref >60)
GFR, Estimated: 44 mL/min — ABNORMAL LOW (ref >60)
Glucose, Bld: 104 mg/dL — ABNORMAL HIGH (ref 70–99)
Glucose, Bld: 165 mg/dL — ABNORMAL HIGH (ref 70–99)
Potassium: 2.5 mmol/L — CL (ref 3.5–5.1)
Potassium: 3 mmol/L — ABNORMAL LOW (ref 3.5–5.1)
Sodium: 148 mmol/L — ABNORMAL HIGH (ref 135–145)
Sodium: 148 mmol/L — ABNORMAL HIGH (ref 135–145)

## 2022-09-26 LAB — BRAIN NATRIURETIC PEPTIDE: B Natriuretic Peptide: 846.3 pg/mL — ABNORMAL HIGH (ref 0.0–100.0)

## 2022-09-26 LAB — MAGNESIUM: Magnesium: 2.5 mg/dL — ABNORMAL HIGH (ref 1.7–2.4)

## 2022-09-26 SURGERY — EGD (ESOPHAGOGASTRODUODENOSCOPY)
Anesthesia: Monitor Anesthesia Care

## 2022-09-26 MED ORDER — PEG-KCL-NACL-NASULF-NA ASC-C 100 G PO SOLR: 1.0000 | Freq: Once | ORAL | Status: DC

## 2022-09-26 MED ORDER — SODIUM CHLORIDE 0.9 % IV SOLN: INTRAVENOUS | Status: DC

## 2022-09-26 MED ORDER — LACTATED RINGERS IV SOLN: INTRAVENOUS | Status: DC

## 2022-09-26 MED ORDER — PEG-KCL-NACL-NASULF-NA ASC-C 100 G PO SOLR
0.5000 | Freq: Once | ORAL | Status: AC
  Administered 2022-09-27: 100 g via ORAL
  Filled 2022-09-26: qty 1

## 2022-09-26 MED ORDER — PHENYLEPHRINE 80 MCG/ML (10ML) SYRINGE FOR IV PUSH (FOR BLOOD PRESSURE SUPPORT)
PREFILLED_SYRINGE | INTRAVENOUS | Status: DC | PRN
  Administered 2022-09-26: 160 ug via INTRAVENOUS
  Administered 2022-09-26 (×2): 80 ug via INTRAVENOUS

## 2022-09-26 MED ORDER — PEG-KCL-NACL-NASULF-NA ASC-C 100 G PO SOLR
0.5000 | Freq: Once | ORAL | Status: AC
  Administered 2022-09-26: 100 g via ORAL
  Filled 2022-09-26: qty 1

## 2022-09-26 MED ORDER — LIDOCAINE 2% (20 MG/ML) 5 ML SYRINGE
INTRAMUSCULAR | Status: DC | PRN
  Administered 2022-09-26: 40 mg via INTRAVENOUS

## 2022-09-26 MED ORDER — POTASSIUM CHLORIDE CRYS ER 20 MEQ PO TBCR
40.0000 meq | EXTENDED_RELEASE_TABLET | Freq: Two times a day (BID) | ORAL | Status: AC
  Administered 2022-09-26: 40 meq via ORAL
  Filled 2022-09-26: qty 2

## 2022-09-26 MED ORDER — POTASSIUM CL IN DEXTROSE 5% 20 MEQ/L IV SOLN
20.0000 meq | INTRAVENOUS | Status: DC
  Administered 2022-09-26: 20 meq via INTRAVENOUS
  Filled 2022-09-26: qty 1000

## 2022-09-26 MED ORDER — POTASSIUM CHLORIDE 10 MEQ/100ML IV SOLN
10.0000 meq | INTRAVENOUS | Status: AC
  Administered 2022-09-26 (×3): 10 meq via INTRAVENOUS
  Filled 2022-09-26 (×3): qty 100

## 2022-09-26 MED ORDER — PROPOFOL 500 MG/50ML IV EMUL
INTRAVENOUS | Status: DC | PRN
  Administered 2022-09-26: 75 ug/kg/min via INTRAVENOUS

## 2022-09-26 MED ORDER — ISOSORB DINITRATE-HYDRALAZINE 20-37.5 MG PO TABS
2.0000 | ORAL_TABLET | Freq: Three times a day (TID) | ORAL | Status: DC
  Administered 2022-09-26 – 2022-09-29 (×7): 2 via ORAL
  Filled 2022-09-26 (×9): qty 2

## 2022-09-26 MED ORDER — POTASSIUM CL IN DEXTROSE 5% 20 MEQ/L IV SOLN
20.0000 meq | INTRAVENOUS | Status: DC
  Filled 2022-09-26: qty 1000

## 2022-09-26 NOTE — Progress Notes (Signed)
PROGRESS NOTE                                                                                                                                                                                                             Patient Demographics:    Janice Small, is a 89 y.o. female, DOB - October 29, 1927, [id number]  Outpatient Primary MD for the patient is [place], Blondell Reveal, MD    LOS - 0  Admit date - 09/25/2022    Chief Complaint  Patient presents with   Weakness       Brief Narrative (HPI from H&P)    89 y.o. female with medical history significant of atrial fibrillation on xarelto, HTN, hypothyroidism, diastolic CHF, primary hyperparathyroidism who presented to ED with complaints of weakness, apparently was having dark tarry stools for the past 3 to 4 days in the ER she was found to be severely anemic with elevated BUN suggesting upper GI bleed and was admitted to the hospital.   Subjective:    Janice Small today has, No headache, No chest pain, No abdominal pain - No Nausea, No new weakness tingling or numbness, no shortness of breath, no further bowel movements here but had black stools for the past 3 to 4 days at home.   Assessment  & Plan :    Acute blood loss related anemia likely due to acute upper GI bleed in the setting of Xarelto use.  She had elevated BUN with history of daily NSAID use and Xarelto use, likely had upper GI bleed, s/p 3 units of packed red blood transfusion on 09/25/2022 with stabilization and posttransfusion H&H.  Continue IV PPI.  GI on board going for EGD and flex sig on 09/26/2022.  Continue to monitor CBC, continue to hold Xarelto and continue PPI.  Acute blood loss anemia - as above   Acute renal failure superimposed on stage 3b chronic kidney disease ([place]) -  Likely pre-renal/intra renal in setting of hypoperfusion of the kidney with acute blood loss anemia, hold NSAID, Farxiga, renal  function much improved.  Monitor.  Elevated troponin - Troponin flat with no chest pain or changes on EKG, not indicative of ACS. Likely demand ischemia in setting of acute anemia, posttransfusion completely symptom-free and stable.    Chronic diastolic CHF servant EF of 60% on recent echocardiogram- She  has worsening dyspnea on exertion and elevated BNP likely CHF exacerbated by anemia, does have edema, TED stockings, diurese as tolerated by blood pressure and renal function.  Lactic acidosis - Likely secondary to hypovolemia from acute GI bleed.   Chr. Atrial fibrillation (HCC) - Rate controlled, Hold xarelto, CHA2DS2-VASc score is 5. Amiodarone started and will take 200mg  BID until 10/07/2022, DCCV pending for April 2024. Conitnue metoprolol and amiodarone for rate control   Essential hypertension - blood pressure mildly elevated, continue beta-blocker, discontinue Norvasc due to 3+ peripheral edema, will substituted with BiDil.  Hypothyroidism - TSH wnl 07/2022, Continue synthroid 42mcg daily   Severe hypokalemia.  Replaced will monitor closely.      Condition - Extremely Guarded  Family Communication  :  None present  Code Status :  Full  Consults  :  GI  PUD Prophylaxis : PPI   Procedures  :     EGD  Flex Sig       Disposition Plan  :    Status is: Observation  DVT Prophylaxis  :    Place and maintain sequential compression device Start: 09/26/22 0548 SCDs Start: 09/25/22 0919    Lab Results  Component Value Date   PLT 192 09/26/2022    Diet :  Diet Order             Diet NPO time specified Except for: Sips with Meds  Diet effective now                    Inpatient Medications  Scheduled Meds:  [MAR Hold] amiodarone  200 mg Oral BID   [MAR Hold] amLODipine  5 mg Oral Daily   [MAR Hold] levothyroxine  75 mcg Oral QAC breakfast   [MAR Hold] metoprolol succinate  25 mg Oral Daily   [MAR Hold] pantoprazole  40 mg Intravenous Q12H   [MAR Hold]  potassium chloride  40 mEq Oral BID   Continuous Infusions:  sodium chloride     pantoprazole 8 mg/hr (09/25/22 2255)   [MAR Hold] potassium chloride     PRN Meds:.[MAR Hold] acetaminophen **OR** [MAR Hold] acetaminophen, [MAR Hold] ondansetron **OR** [MAR Hold] ondansetron (ZOFRAN) IV  Antibiotics  :    Anti-infectives (From admission, onward)    None         Objective:   Vitals:   09/25/22 2300 09/26/22 0400 09/26/22 0800 09/26/22 0836  BP: (!) 143/80 129/78 137/64 (!) 143/65  Pulse: 88 86 92 86  Resp: 20 16 10    Temp: 98.4 F (36.9 C) 98.4 F (36.9 C)    TempSrc: Oral Oral    SpO2: 100% 99% 100%     Wt Readings from Last 3 Encounters:  09/22/22 62.2 kg  09/08/22 60.9 kg  09/04/22 60.3 kg     Intake/Output Summary (Last 24 hours) at 09/26/2022 1011 Last data filed at 09/26/2022 0800 Gross per 24 hour  Intake 1314.16 ml  Output 2425 ml  Net -1110.84 ml     Physical Exam  Awake Alert, No new F.N deficits, Normal affect [place].AT,PERRAL Supple Neck, No JVD,   Symmetrical Chest wall movement, Good air movement bilaterally, CTAB RRR,No Gallops,Rubs or new Murmurs,  +ve B.Sounds, Abd Soft, No tenderness,   No Cyanosis, Clubbing or edema        Data Review:    Recent Labs  Lab 09/25/22 0230 09/25/22 1304 09/25/22 1838 09/26/22 0822  WBC 6.5 8.3 9.7 8.5  HGB 3.9* 8.9* 9.3* 8.6*  HCT 13.4* 26.7* 27.0* 26.0*  PLT 263 188 201 192  MCV 99.3 92.7 88.8 91.2  MCH 28.9 30.9 30.6 30.2  MCHC 29.1* 33.3 34.4 33.1  RDW 17.2* 15.6* 15.4 16.1*  LYMPHSABS 0.7  --   --   --   MONOABS 0.4  --   --   --   EOSABS 0.0  --   --   --   BASOSABS 0.0  --   --   --     Recent Labs  Lab 09/25/22 0230 09/26/22 0822  NA 137 148*  K 4.0 2.5*  CL 106 116*  CO2 14* 22  ANIONGAP 17* 10  GLUCOSE 125* 104*  BUN 83* 44*  CREATININE 1.87* 1.18*  AST 45*  --   ALT 35  --   ALKPHOS 31*  --   BILITOT 0.8  --   ALBUMIN 3.2*  --   BNP 467.9* 846.3*  MG  --  2.5*   CALCIUM 8.9 8.3*   Radiology Reports DG CHEST PORT 1 VIEW  Result Date: 09/25/2022 CLINICAL DATA:  Shortness of breath, atrial fibrillation EXAM: PORTABLE CHEST 1 VIEW COMPARISON:  Previous studies including the examination done on 01/09/2022 FINDINGS: Transverse diameter of heart is increased. There are no signs of pulmonary edema or focal pulmonary consolidation. There is no significant pleural effusion or pneumothorax. Degenerative changes with small bony spurs are noted in both shoulders. IMPRESSION: There are no signs of pulmonary edema or focal pulmonary consolidation. Electronically Signed   By: Elmer Picker M.D.   On: 09/25/2022 09:40      Signature  -   Lala Lund M.D on 09/26/2022 at 10:11 AM   -  To page go to [email or web address]

## 2022-09-26 NOTE — Progress Notes (Signed)
PT Cancellation Note  Patient Details Name: Janice Small MRN: [id number] DOB: 23-Mar-1928   Cancelled Treatment:    Reason Eval/Treat Not Completed: Patient at procedure or test/unavailable. Pt currently off unit for EGD and flexible sigmoidoscopy. PT will follow up with pt for evaluation as available/appropriate. Thank you.   Luvenia Heller 09/26/2022, 11:37 AM

## 2022-09-26 NOTE — Anesthesia Preprocedure Evaluation (Signed)
Anesthesia Evaluation  Patient identified by MRN, date of birth, ID band Patient awake    Reviewed: Allergy & Precautions, NPO status , Patient's Chart, lab work & pertinent test results  Airway Mallampati: II  TM Distance: >3 FB Neck ROM: Full    Dental  (+) Dental Advisory Given   Pulmonary neg pulmonary ROS   breath sounds clear to auscultation       Cardiovascular hypertension, Pt. on medications +CHF   Rhythm:Regular Rate:Normal     Neuro/Psych negative neurological ROS     GI/Hepatic negative GI ROS, Neg liver ROS,,,  Endo/Other  Hypothyroidism    Renal/GU CRFRenal disease     Musculoskeletal   Abdominal   Peds  Hematology  (+) Blood dyscrasia, anemia   Anesthesia Other Findings   Reproductive/Obstetrics                             Anesthesia Physical Anesthesia Plan  ASA: 3  Anesthesia Plan: MAC   Post-op Pain Management:    Induction:   PONV Risk Score and Plan: 2 and Propofol infusion  Airway Management Planned: Natural Airway and Nasal Cannula  Additional Equipment:   Intra-op Plan:   Post-operative Plan:   Informed Consent: I have reviewed the patients History and Physical, chart, labs and discussed the procedure including the risks, benefits and alternatives for the proposed anesthesia with the patient or authorized representative who has indicated his/her understanding and acceptance.       Plan Discussed with: CRNA  Anesthesia Plan Comments:        Anesthesia Quick Evaluation

## 2022-09-26 NOTE — Op Note (Signed)
[facility] Patient Name: Janice Small Procedure Date : 09/26/2022 MRN: [id number] Attending MD: Justice Britain , MD, [id number] Date of Birth: 20-Jan-1928 CSN: [id number] Age: 89 Admit Type: Inpatient Procedure:                Upper GI endoscopy Indications:              Iron deficiency anemia secondary to chronic blood                            loss, Hematochezia, Melena, Occult blood in stool Providers:                Justice Britain, MD, Doristine Johns, RN,                            Gloris Ham, Technician Referring MD:             Gatha Mayer, MD, Inpatient Medical Service Medicines:                Monitored Anesthesia Care Complications:            No immediate complications. Estimated Blood Loss:     Estimated blood loss was minimal. Procedure:                Pre-Anesthesia Assessment:                           - Prior to the procedure, a History and Physical                            was performed, and patient medications and                            allergies were reviewed. The patient's tolerance of                            previous anesthesia was also reviewed. The risks                            and benefits of the procedure and the sedation                            options and risks were discussed with the patient.                            All questions were answered, and informed consent                            was obtained. Prior Anticoagulants: The patient has                            taken Eliquis (apixaban), last dose was 1 day prior                            to procedure. ASA Grade Assessment: III - A patient  with severe systemic disease. After reviewing the                            risks and benefits, the patient was deemed in                            satisfactory condition to undergo the procedure.                           After obtaining informed consent, the endoscope was                             passed under direct vision. Throughout the                            procedure, the patient's blood pressure, pulse, and                            oxygen saturations were monitored continuously. The                            [id number] Olympus endoscope was                            introduced through the mouth, and advanced to the                            second part of duodenum. The upper GI endoscopy was                            accomplished without difficulty. The patient                            tolerated the procedure. Scope In: Scope Out: Findings:      White nummular lesions were noted in the entire esophagus. Brushings for       cytology were obtained in the entire esophagus.      A 2 cm hiatal hernia was present.      A single diminutive angioectasia with no bleeding was found in the       gastric fundus. Fulguration to ablate the lesion to prevent bleeding by       argon plasma (gastric settings) was successful.      Three non-bleeding linear gastric ulcers with a clean ulcer base       (Forrest Class III) were found in the gastric antrum. The largest lesion       was 6 mm in largest dimension.      Patchy mild inflammation characterized by erosions and erythema was       found in the entire examined stomach. Biopsies were taken with a cold       forceps for histology and Helicobacter pylori testing.      No gross lesions were noted in the duodenal bulb, in the first portion       of the duodenum and in the second portion of the duodenum. Impression:               -  White nummular lesions in esophageal mucosa.                            Brushings performed.                           - 2 cm hiatal hernia.                           - A single non-bleeding angioectasia in the fundus.                            Treated with argon plasma coagulation (APC).                           - Non-bleeding gastric ulcers with a clean ulcer                             base (Forrest Class III).                           - Gastritis. Biopsied.                           - No gross lesions in the duodenal bulb, in the                            first portion of the duodenum and in the second                            portion of the duodenum. Recommendation:           - Proceed to scheduled Flexible Sigmoidoscopy.                           - PO PPI twice daily.                           - Carafate twice daily for 2-weeks.                           - Consider repeat EGD with for consideration of                            ulcer healing evaluation in 4 months though may                            want to forego that based on patient's age and                            other medical comorbidities.                           - The findings and recommendations were discussed  with the patient.                           - The findings and recommendations were discussed                            with the patient's family.                           - The findings and recommendations were discussed                            with the referring physician. Procedure Code(s):        --- Professional ---                           [id number], Esophagogastroduodenoscopy, flexible,                            transoral; with biopsy, single or multiple Diagnosis Code(s):        --- Professional ---                           K25.9, Gastric ulcer, unspecified as acute or                            chronic, without hemorrhage or perforation                           K29.70, Gastritis, unspecified, without bleeding                           D50.0, Iron deficiency anemia secondary to blood                            loss (chronic)                           K92.1, Melena (includes Hematochezia)                           R19.5, Other fecal abnormalities CPT copyright 2022 [employer]. All rights reserved. The codes  documented in this report are preliminary and upon coder review may  be revised to meet current compliance requirements. Justice Britain, MD 09/26/2022 4:10:48 PM Number of Addenda: 0

## 2022-09-26 NOTE — Evaluation (Signed)
Physical Therapy Evaluation Patient Details Name: Janice Small MRN: [id number] DOB: 1927-10-03 Today's Date: 09/26/2022  History of Present Illness  89 y/o F admitted to [facility] on 3/14 for weakness, along with lightheadedness, episodes of black stool, and dyspnea on exertion. Hgb found to be 3.9, improved after 3 units of PRBCs. S/p EGD and flexible sigmoidoscopy 3/15. PMHx: chronic a-fib on Xarelto, HTN, hypothyroidism, diastolic CHF, primary hyperparathyroidism, prolapsing bleeding hemorrhoids s/p banding.  Clinical Impression  Pt presents today functioning below her mobility baseline with deficits in strength, balance, and activity tolerance. Pt intermittently uses a SPC at baseline, lives alone and is able to manage stair negotiation up to her bedroom, denying any recent falls. Pt requiring minG-minA for all mobility today with use of RW, only ambulating in room as food arriving and pt requesting to eat. Pt will benefit from acute PT during admission to progress mobility and activity tolerance, anticipate with medical management pt will progress, recommend HHPT at discharge. Acute PT will continue to follow as appropriate.        Recommendations for follow up therapy are one component of a multi-disciplinary discharge planning process, led by the attending physician.  Recommendations may be updated based on patient status, additional functional criteria and insurance authorization.  Follow Up Recommendations Home health PT      Assistance Recommended at Discharge Intermittent Supervision/Assistance  Patient can return home with the following  A little help with walking and/or transfers;Assist for transportation;Help with stairs or ramp for entrance    Equipment Recommendations None recommended by PT  Recommendations for Other Services       Functional Status Assessment Patient has had a recent decline in their functional status and demonstrates the ability to make significant improvements  in function in a reasonable and predictable amount of time.     Precautions / Restrictions Precautions Precautions: Fall Restrictions Weight Bearing Restrictions: No      Mobility  Bed Mobility Overal bed mobility: Needs Assistance Bed Mobility: Supine to Sit, Sit to Supine     Supine to sit: Min guard, HOB elevated Sit to supine: Min assist, HOB elevated   General bed mobility comments: minG for balance and sequencing, minA provided back to supine for BLE management    Transfers Overall transfer level: Needs assistance Equipment used: Rolling walker (2 wheels) Transfers: Sit to/from Stand Sit to Stand: Min guard, Min assist           General transfer comment: minG from bed, minA and use of grab bar to stand from toilet due to lower height. Verbal cues provided for proper hand placement for both transfers    Ambulation/Gait Ambulation/Gait assistance: Min guard Gait Distance (Feet): 15 Feet (x15', seated rest break for toileting) Assistive device: Rolling walker (2 wheels) Gait Pattern/deviations: Step-through pattern, Decreased stride length, Trunk flexed Gait velocity: decreased     General Gait Details: minG provided for safety and line management as well as cueing for RW management int he room and to the bathroom  Stairs            Wheelchair Mobility    Modified Rankin (Stroke Patients Only)       Balance Overall balance assessment: Needs assistance Sitting-balance support: No upper extremity supported, Feet supported Sitting balance-Leahy Scale: Fair     Standing balance support: Bilateral upper extremity supported, During functional activity, Reliant on assistive device for balance Standing balance-Leahy Scale: Poor Standing balance comment: reliant on RW for ambulation and grab bar for static standing  Pertinent Vitals/Pain Pain Assessment Pain Assessment: Faces Faces Pain Scale: No hurt    Home  Living Family/patient expects to be discharged to:: Private residence Living Arrangements: Alone Available Help at Discharge: Family;Available PRN/intermittently Type of Home: House Home Access: Stairs to enter Entrance Stairs-Rails: None Entrance Stairs-Number of Steps: 2 Alternate Level Stairs-Number of Steps: 12 Home Layout: Two level;Bed/bath upstairs Home Equipment: Rolling Walker (2 wheels);Rollator (4 wheels);Cane - single point Additional Comments: pt lives alone, one son lives down the road and checks in on pt as needed, provides transportation    Prior Function Prior Level of Function : Needs assist             Mobility Comments: pt ambulates with SPC in the community, often no AD in her home, son provides transportation, pt and son deny any falls       Hand Dominance   Dominant Hand: Right    Extremity/Trunk Assessment   Upper Extremity Assessment Upper Extremity Assessment: Defer to OT evaluation    Lower Extremity Assessment Lower Extremity Assessment: Generalized weakness (reports pain with hip flexion B, no formal assessment but at least 3-/5 throughout)    Cervical / Trunk Assessment Cervical / Trunk Assessment: Kyphotic  Communication   Communication: No difficulties  Cognition Arousal/Alertness: Awake/alert Behavior During Therapy: WFL for tasks assessed/performed Overall Cognitive Status: Impaired/Different from baseline Area of Impairment: Orientation, Problem solving, Following commands, Safety/judgement                 Orientation Level: Disoriented to, Time     Following Commands: Follows one step commands with increased time Safety/Judgement: Decreased awareness of deficits   Problem Solving: Requires verbal cues General Comments: Requiring options for month but able to state, reports she usually keeps up with the date as she pays her bills. Pt pleasant throughout, requiring verbal cues for safety and intermittent increased time for  command following, of note pt with EGD earlier        General Comments General comments (skin integrity, edema, etc.): VSS on room air    Exercises     Assessment/Plan    PT Assessment Patient needs continued PT services  PT Problem List Decreased strength;Decreased activity tolerance;Decreased balance;Decreased mobility;Decreased knowledge of use of DME       PT Treatment Interventions DME instruction;Gait training;Stair training;Functional mobility training;Therapeutic activities;Therapeutic exercise;Balance training;Neuromuscular re-education;Patient/family education    PT Goals (Current goals can be found in the Care Plan section)  Acute Rehab PT Goals Patient Stated Goal: go home PT Goal Formulation: With patient/family Time For Goal Achievement: 10/10/22 Potential to Achieve Goals: Good    Frequency Min 3X/week     Co-evaluation               AM-PAC PT "6 Clicks" Mobility  Outcome Measure Help needed turning from your back to your side while in a flat bed without using bedrails?: A Little Help needed moving from lying on your back to sitting on the side of a flat bed without using bedrails?: A Little Help needed moving to and from a bed to a chair (including a wheelchair)?: A Little Help needed standing up from a chair using your arms (e.g., wheelchair or bedside chair)?: A Little Help needed to walk in hospital room?: A Little Help needed climbing 3-5 steps with a railing? : A Lot 6 Click Score: 17    End of Session Equipment Utilized During Treatment: Gait belt Activity Tolerance: Patient tolerated treatment well Patient left: in bed;with call  bell/phone within reach;with bed alarm set;with family/visitor present Nurse Communication: Mobility status PT Visit Diagnosis: Other abnormalities of gait and mobility (R26.89);Muscle weakness (generalized) (M62.81)    Time: [id number] PT Time Calculation (min) (ACUTE ONLY): 24 min   Charges:   PT  Evaluation $PT Eval Low Complexity: 1 Low          Charlynne Cousins, PT DPT Acute Rehabilitation Services Office [phone]   Luvenia Heller 09/26/2022, 3:58 PM

## 2022-09-26 NOTE — Anesthesia Postprocedure Evaluation (Signed)
Anesthesia Post Note  Patient: Janice Small  Procedure(s) Performed: ESOPHAGOGASTRODUODENOSCOPY (EGD) FLEXIBLE SIGMOIDOSCOPY HOT HEMOSTASIS (ARGON PLASMA COAGULATION/BICAP) BIOPSY ESOPHAGEAL BRUSHING     Patient location during evaluation: PACU Anesthesia Type: MAC Level of consciousness: awake and alert Pain management: pain level controlled Vital Signs Assessment: post-procedure vital signs reviewed and stable Respiratory status: spontaneous breathing, nonlabored ventilation, respiratory function stable and patient connected to nasal cannula oxygen Cardiovascular status: stable and blood pressure returned to baseline Postop Assessment: no apparent nausea or vomiting Anesthetic complications: no  No notable events documented.  Last Vitals:  Vitals:   09/26/22 1245 09/26/22 1250  BP:    Pulse: 98   Resp: (!) 23 (!) 21  Temp:    SpO2: 99%     Last Pain:  Vitals:   09/26/22 1009  TempSrc: Temporal  PainSc: 0-No pain                 Tiajuana Amass

## 2022-09-26 NOTE — Interval H&P Note (Signed)
History and Physical Interval Note:  09/26/2022 11:02 AM  Janice Small  has presented today for surgery, with the diagnosis of Melena/Anemia/Chronic Anticoagulation.  The various methods of treatment have been discussed with the patient and family. After consideration of risks, benefits and other options for treatment, the patient has consented to  Procedure(s): ESOPHAGOGASTRODUODENOSCOPY (EGD) (N/A) FLEXIBLE SIGMOIDOSCOPY (N/A) as a surgical intervention.  The patient's history has been reviewed, patient examined, no change in status, stable for surgery.  I have reviewed the patient's chart and labs.  Questions were answered to the patient's satisfaction.     [employer]

## 2022-09-26 NOTE — Op Note (Signed)
[facility] Patient Name: Janice Small Procedure Date : 09/26/2022 MRN: [id number] Attending MD: Justice Britain , MD, [id number] Date of Birth: Nov 11, 1927 CSN: [id number] Age: 89 Admit Type: Inpatient Procedure:                Flexible Sigmoidoscopy Indications:              Hematochezia, Acute post hemorrhagic anemia Providers:                Justice Britain, MD, Doristine Johns, RN,                            Gloris Ham, Technician Referring MD:             Gatha Mayer, MD, inpatient medical service Medicines:                Monitored Anesthesia Care Complications:            No immediate complications. Estimated Blood Loss:     Estimated blood loss: none. Procedure:                Pre-Anesthesia Assessment:                           - Prior to the procedure, a History and Physical                            was performed, and patient medications and                            allergies were reviewed. The patient's tolerance of                            previous anesthesia was also reviewed. The risks                            and benefits of the procedure and the sedation                            options and risks were discussed with the patient.                            All questions were answered, and informed consent                            was obtained. Prior Anticoagulants: The patient has                            taken Eliquis (apixaban), last dose was 1 day prior                            to procedure. ASA Grade Assessment: III - A patient                            with severe systemic disease. After reviewing the  risks and benefits, the patient was deemed in                            satisfactory condition to undergo the procedure.                           After obtaining informed consent, the scope was                            passed under direct vision. The [id number]                             [id number] Olympus endoscope was introduced through                            the anus and advanced to the the left transverse                            colon. The flexible sigmoidoscopy was somewhat                            difficult due to inadequate bowel prep and a                            tortuous colon. Successful completion of the                            procedure was aided by performing the maneuvers                            documented (below) in this report. The patient                            tolerated the procedure. The quality of the bowel                            preparation was inadequate. Scope In: 12:04:46 PM Scope Out: 12:13:27 PM Total Procedure Duration: 0 hours 8 minutes 41 seconds  Findings:      The digital rectal exam findings include hemorrhoids. Pertinent       negatives include no palpable rectal lesions.      Clotted blood was found in the rectum, in the recto-sigmoid colon, in       the sigmoid colon and in the descending colon.      Multiple small-mouthed diverticula were found in the recto-sigmoid colon       and sigmoid colon.      Non-bleeding non-thrombosed external and internal hemorrhoids were found       during retroflexion, during perianal exam and during digital exam. The       hemorrhoids were Grade III (internal hemorrhoids that prolapse but       require manual reduction). Impression:               - Preparation of the colon was inadequate.                           -  Hemorrhoids found on digital rectal exam.                           - Blood in the rectum, in the recto-sigmoid colon,                            in the sigmoid colon and in the descending colon.                           - Diverticulosis in the recto-sigmoid colon and in                            the sigmoid colon.                           - Non-bleeding non-thrombosed external and internal                            hemorrhoids. Recommendation:           - The  patient will be observed post-procedure,                            until all discharge criteria are met.                           - Return patient to hospital ward for ongoing care.                           - Recommend full colonoscopy to evaluate colon for                            potential source of GI blood loss. With the maroon                            stools noted on today's examination, her                            intermittent reported dark stools, I have concerned                            that there could be a right-sided lesion that is                            the source of issues. If her colonoscopy is clear,                            then we will consider video capsule endoscopy                            swallowing. Plan will be for tomorrow.                           -If patient has significant hemodynamicly unstable  bleeding, she needs to go for CT angio GI bleed                            scan. Expect that as she cleans out for her                            colonoscopy tonight into tomorrow that she will                            have some old blood per rectum.                           - The findings and recommendations were discussed                            with the patient.                           - The findings and recommendations were discussed                            with the patient's family.                           - The findings and recommendations were discussed                            with the referring physician. Procedure Code(s):        --- Professional ---                           [phone], Sigmoidoscopy, flexible; diagnostic,                            including collection of specimen(s) by brushing or                            washing, when performed (separate procedure) Diagnosis Code(s):        --- Professional ---                           [id number], Third degree hemorrhoids                            K62.5, Hemorrhage of anus and rectum                           K92.2, Gastrointestinal hemorrhage, unspecified                           K92.1, Melena (includes Hematochezia)                           D62, Acute posthemorrhagic anemia  K57.30, Diverticulosis of large intestine without                            perforation or abscess without bleeding CPT copyright 2022 American Medical Association. All rights reserved. The codes documented in this report are preliminary and upon coder review may  be revised to meet current compliance requirements. Justice Britain, MD 09/26/2022 4:16:43 PM Number of Addenda: 0

## 2022-09-26 NOTE — Progress Notes (Signed)
OT Cancellation Note  Patient Details Name: Janice Small MRN: [id number] DOB: November 09, 1927   Cancelled Treatment:    Reason Eval/Treat Not Completed: Patient at procedure or test/ unavailable  Malka So 09/26/2022, 11:18 AM Cleta Alberts, OTR/L Acute Rehabilitation Services Office: [phone]

## 2022-09-26 NOTE — Transfer of Care (Signed)
Immediate Anesthesia Transfer of Care Note  Patient: Janice Small  Procedure(s) Performed: ESOPHAGOGASTRODUODENOSCOPY (EGD) FLEXIBLE SIGMOIDOSCOPY HOT HEMOSTASIS (ARGON PLASMA COAGULATION/BICAP) BIOPSY ESOPHAGEAL BRUSHING  Patient Location: Endoscopy Unit  Anesthesia Type:MAC  Level of Consciousness: awake and alert   Airway & Oxygen Therapy: Patient Spontanous Breathing and Patient connected to nasal cannula oxygen  Post-op Assessment: Report given to RN and Post -op Vital signs reviewed and stable  Post vital signs: Reviewed and stable  Last Vitals:  Vitals Value Taken Time  BP 121/68 09/26/22 1221  Temp    Pulse 92 09/26/22 1221  Resp 20 09/26/22 1222  SpO2 100 % 09/26/22 1221  Vitals shown include unvalidated device data.  Last Pain:  Vitals:   09/26/22 1009  TempSrc: Temporal  PainSc: 0-No pain         Complications: No notable events documented.

## 2022-09-27 ENCOUNTER — Encounter (HOSPITAL_COMMUNITY)
Admission: EM | Disposition: A | Discharge status: Skilled Nursing Facility | Payer: Self-pay | Source: Home / Self Care | Attending: Internal Medicine

## 2022-09-27 ENCOUNTER — Inpatient Hospital Stay (HOSPITAL_COMMUNITY): Payer: Medicare Other | Admitting: Anesthesiology

## 2022-09-27 ENCOUNTER — Encounter (HOSPITAL_COMMUNITY): Payer: Self-pay | Admitting: Internal Medicine

## 2022-09-27 DIAGNOSIS — D62 Acute posthemorrhagic anemia: Secondary | ICD-10-CM

## 2022-09-27 DIAGNOSIS — K642 Third degree hemorrhoids: Secondary | ICD-10-CM

## 2022-09-27 DIAGNOSIS — K922 Gastrointestinal hemorrhage, unspecified: Secondary | ICD-10-CM | POA: Diagnosis not present

## 2022-09-27 DIAGNOSIS — K921 Melena: Secondary | ICD-10-CM | POA: Diagnosis not present

## 2022-09-27 DIAGNOSIS — I509 Heart failure, unspecified: Secondary | ICD-10-CM

## 2022-09-27 DIAGNOSIS — K5520 Angiodysplasia of colon without hemorrhage: Secondary | ICD-10-CM

## 2022-09-27 DIAGNOSIS — I4891 Unspecified atrial fibrillation: Secondary | ICD-10-CM

## 2022-09-27 DIAGNOSIS — K5730 Diverticulosis of large intestine without perforation or abscess without bleeding: Secondary | ICD-10-CM

## 2022-09-27 DIAGNOSIS — I110 Hypertensive heart disease with heart failure: Secondary | ICD-10-CM

## 2022-09-27 HISTORY — PX: HOT HEMOSTASIS: SHX5433

## 2022-09-27 HISTORY — PX: COLONOSCOPY WITH PROPOFOL: SHX5780

## 2022-09-27 HISTORY — PX: GIVENS CAPSULE STUDY: SHX5432

## 2022-09-27 HISTORY — PX: HEMOSTASIS CLIP PLACEMENT: SHX6857

## 2022-09-27 LAB — CBC WITH DIFFERENTIAL/PLATELET
Abs Immature Granulocytes: 0.05 10*3/uL (ref 0.00–0.07)
Basophils Absolute: 0 10*3/uL (ref 0.0–0.1)
Basophils Relative: 0 %
Eosinophils Absolute: 0 10*3/uL (ref 0.0–0.5)
Eosinophils Relative: 0 %
HCT: 21.2 % — ABNORMAL LOW (ref 36.0–46.0)
Hemoglobin: 7 g/dL — ABNORMAL LOW (ref 12.0–15.0)
Immature Granulocytes: 1 %
Lymphocytes Relative: 9 %
Lymphs Abs: 0.7 10*3/uL (ref 0.7–4.0)
MCH: 30.4 pg (ref 26.0–34.0)
MCHC: 33 g/dL (ref 30.0–36.0)
MCV: 92.2 fL (ref 80.0–100.0)
Monocytes Absolute: 0.7 10*3/uL (ref 0.1–1.0)
Monocytes Relative: 9 %
Neutro Abs: 6.2 10*3/uL (ref 1.7–7.7)
Neutrophils Relative %: 81 %
Platelets: 186 10*3/uL (ref 150–400)
RBC: 2.3 MIL/uL — ABNORMAL LOW (ref 3.87–5.11)
RDW: 16.2 % — ABNORMAL HIGH (ref 11.5–15.5)
WBC: 7.5 10*3/uL (ref 4.0–10.5)
nRBC: 2.4 % — ABNORMAL HIGH (ref 0.0–0.2)

## 2022-09-27 LAB — BASIC METABOLIC PANEL
Anion gap: 8 (ref 5–15)
BUN: 27 mg/dL — ABNORMAL HIGH (ref 8–23)
CO2: 23 mmol/L (ref 22–32)
Calcium: 7.9 mg/dL — ABNORMAL LOW (ref 8.9–10.3)
Chloride: 120 mmol/L — ABNORMAL HIGH (ref 98–111)
Creatinine, Ser: 1 mg/dL (ref 0.44–1.00)
GFR, Estimated: 53 mL/min — ABNORMAL LOW (ref >60)
Glucose, Bld: 146 mg/dL — ABNORMAL HIGH (ref 70–99)
Potassium: 3 mmol/L — ABNORMAL LOW (ref 3.5–5.1)
Sodium: 151 mmol/L — ABNORMAL HIGH (ref 135–145)

## 2022-09-27 LAB — CBC
HCT: 26.7 % — ABNORMAL LOW (ref 36.0–46.0)
Hemoglobin: 9 g/dL — ABNORMAL LOW (ref 12.0–15.0)
MCH: 31.3 pg (ref 26.0–34.0)
MCHC: 33.7 g/dL (ref 30.0–36.0)
MCV: 92.7 fL (ref 80.0–100.0)
Platelets: 176 10*3/uL (ref 150–400)
RBC: 2.88 MIL/uL — ABNORMAL LOW (ref 3.87–5.11)
RDW: 15.9 % — ABNORMAL HIGH (ref 11.5–15.5)
WBC: 7.2 10*3/uL (ref 4.0–10.5)
nRBC: 2.4 % — ABNORMAL HIGH (ref 0.0–0.2)

## 2022-09-27 LAB — MAGNESIUM: Magnesium: 2.4 mg/dL (ref 1.7–2.4)

## 2022-09-27 LAB — PREPARE RBC (CROSSMATCH)

## 2022-09-27 LAB — BRAIN NATRIURETIC PEPTIDE: B Natriuretic Peptide: 561.6 pg/mL — ABNORMAL HIGH (ref 0.0–100.0)

## 2022-09-27 SURGERY — COLONOSCOPY WITH PROPOFOL
Anesthesia: Monitor Anesthesia Care

## 2022-09-27 MED ORDER — ALUM & MAG HYDROXIDE-SIMETH 200-200-20 MG/5ML PO SUSP
30.0000 mL | ORAL | Status: DC | PRN
  Administered 2022-09-27: 30 mL via ORAL
  Filled 2022-09-27: qty 30

## 2022-09-27 MED ORDER — PROPOFOL 500 MG/50ML IV EMUL
INTRAVENOUS | Status: DC | PRN
  Administered 2022-09-27: 100 ug/kg/min via INTRAVENOUS

## 2022-09-27 MED ORDER — SODIUM CHLORIDE 0.9% IV SOLUTION: Freq: Once | INTRAVENOUS | Status: AC

## 2022-09-27 MED ORDER — PHENYLEPHRINE 80 MCG/ML (10ML) SYRINGE FOR IV PUSH (FOR BLOOD PRESSURE SUPPORT)
PREFILLED_SYRINGE | INTRAVENOUS | Status: DC | PRN
  Administered 2022-09-27 (×4): 160 ug via INTRAVENOUS

## 2022-09-27 MED ORDER — LIDOCAINE 2% (20 MG/ML) 5 ML SYRINGE
INTRAMUSCULAR | Status: DC | PRN
  Administered 2022-09-27: 40 mg via INTRAVENOUS

## 2022-09-27 MED ORDER — METOPROLOL SUCCINATE ER 50 MG PO TB24
50.0000 mg | ORAL_TABLET | Freq: Every day | ORAL | Status: DC
  Administered 2022-09-27: 50 mg via ORAL
  Filled 2022-09-27: qty 1

## 2022-09-27 MED ORDER — SIMETHICONE 80 MG PO CHEW
80.0000 mg | CHEWABLE_TABLET | Freq: Four times a day (QID) | ORAL | Status: DC | PRN
  Administered 2022-09-27: 80 mg via ORAL
  Filled 2022-09-27: qty 1

## 2022-09-27 MED ORDER — SODIUM CHLORIDE 0.9 % IV SOLN: INTRAVENOUS | Status: DC

## 2022-09-27 MED ORDER — POTASSIUM CHLORIDE CRYS ER 20 MEQ PO TBCR
40.0000 meq | EXTENDED_RELEASE_TABLET | Freq: Once | ORAL | Status: AC
  Administered 2022-09-27: 40 meq via ORAL
  Filled 2022-09-27: qty 2

## 2022-09-27 MED ORDER — POTASSIUM CL IN DEXTROSE 5% 20 MEQ/L IV SOLN
20.0000 meq | INTRAVENOUS | Status: DC
  Administered 2022-09-27 – 2022-09-28 (×2): 20 meq via INTRAVENOUS
  Filled 2022-09-27 (×2): qty 1000

## 2022-09-27 SURGICAL SUPPLY — 22 items

## 2022-09-27 NOTE — Anesthesia Postprocedure Evaluation (Signed)
Anesthesia Post Note  Patient: Janice Small  Procedure(s) Performed: COLONOSCOPY WITH PROPOFOL GIVENS CAPSULE STUDY HOT HEMOSTASIS (ARGON PLASMA COAGULATION/BICAP) HEMOSTASIS CLIP PLACEMENT     Patient location during evaluation: Endoscopy Anesthesia Type: MAC Level of consciousness: oriented, awake and alert and awake Pain management: pain level controlled Vital Signs Assessment: post-procedure vital signs reviewed and stable Respiratory status: spontaneous breathing, nonlabored ventilation, respiratory function stable and patient connected to nasal cannula oxygen Cardiovascular status: blood pressure returned to baseline and stable Postop Assessment: no headache, no backache and no apparent nausea or vomiting Anesthetic complications: no   No notable events documented.  Last Vitals:  Vitals:   09/27/22 1415 09/27/22 1600  BP: 133/68 (!) 142/71  Pulse: (!) 102 95  Resp: (!) 21 (!) 21  Temp: 36.8 C 37 C  SpO2: 97% 96%    Last Pain:  Vitals:   09/27/22 1600  TempSrc: Oral  PainSc: 0-No pain                 Santa Lighter

## 2022-09-27 NOTE — Anesthesia Procedure Notes (Signed)
Procedure Name: MAC Date/Time: 09/27/2022 12:21 PM  Performed by: Kyung Rudd, CRNAPre-anesthesia Checklist: Patient identified, Emergency Drugs available, Suction available and Patient being monitored Patient Re-evaluated:Patient Re-evaluated prior to induction Oxygen Delivery Method: Circle system utilized Preoxygenation: Pre-oxygenation with 100% oxygen Induction Type: IV induction Placement Confirmation: positive ETCO2 Dental Injury: Teeth and Oropharynx as per pre-operative assessment

## 2022-09-27 NOTE — Interval H&P Note (Signed)
History and Physical Interval Note:  09/27/2022 9:42 AM  Janice Small  has presented today for surgery, with the diagnosis of GI bleed.  The various methods of treatment have been discussed with the patient and family. After consideration of risks, benefits and other options for treatment, the patient has consented to  Procedure(s): COLONOSCOPY WITH PROPOFOL (N/A) as a surgical intervention.  The patient's history has been reviewed, patient examined, no change in status, stable for surgery.  I have reviewed the patient's chart and labs.  Questions were answered to the patient's satisfaction.     [employer]

## 2022-09-27 NOTE — Progress Notes (Signed)
PROGRESS NOTE                                                                                                                                                                                                             Patient Demographics:    Janice Small, is a 89 y.o. female, DOB - 1928/03/02, [id number]  Outpatient Primary MD for the patient is [place], Blondell Reveal, MD    LOS - 1  Admit date - 09/25/2022    Chief Complaint  Patient presents with   Weakness       Brief Narrative (HPI from H&P)    89 y.o. female with medical history significant of atrial fibrillation on xarelto, HTN, hypothyroidism, diastolic CHF, primary hyperparathyroidism who presented to ED with complaints of weakness, apparently was having dark tarry stools for the past 3 to 4 days in the ER she was found to be severely anemic with elevated BUN suggesting upper GI bleed and was admitted to the hospital.   Subjective:   Patient in bed denies any headache chest or abdominal pain, had a bowel movement which was dark with some blood in it, no shortness of breath.  No focal weakness   Assessment  & Plan :    Acute blood loss related anemia likely due to acute upper GI bleed in the setting of Xarelto use.  She had elevated BUN with history of daily NSAID use and Xarelto use, likely had upper GI bleed, s/p 3 units of packed red blood transfusion on 09/25/2022, another unit on 09/27/2022.  Continue IV PPI.  She underwent on 09/26/2022 EGD noted with hiatal hernia, nonbleeding gastric ulcer with gastritis, a single non-bleeding angioectasia in the fundus, treated with argon plasma coagulation.  She still seems to have reoccurrence of some bleeding night of 09/26/2022, getting another unit of packed RBC on 09/27/2022 along with colonoscopy.  Continue PPI and close CBC monitoring.  Acute blood loss anemia - as above   Acute renal failure superimposed on stage 3b  chronic kidney disease ([facility]) -  Likely pre-renal/intra renal in setting of hypoperfusion of the kidney with acute blood loss anemia, hold NSAID, Farxiga, renal function much improved.  Monitor.  Elevated troponin - Troponin flat with no chest pain or changes on EKG, not indicative of ACS. Likely demand ischemia in setting of acute anemia,  posttransfusion completely symptom-free and stable.    Chronic diastolic CHF servant EF of 60% on recent echocardiogram- She has worsening dyspnea on exertion and elevated BNP likely CHF exacerbated by anemia, does have edema, TED stockings, diurese as tolerated by blood pressure and renal function.  Lactic acidosis - Likely secondary to hypovolemia from acute GI bleed.   Chr. Atrial fibrillation (HCC) - Rate controlled, Hold xarelto, CHA2DS2-VASc score is 5. Amiodarone started and will take 200mg  BID until 10/07/2022, DCCV pending for April 2024. Conitnue metoprolol and amiodarone for rate control   Essential hypertension - blood pressure mildly elevated, continue beta-blocker, discontinue Norvasc due to 3+ peripheral edema, will substituted with BiDil.  Hypothyroidism - TSH wnl 07/2022, Continue synthroid 31mcg daily   Severe hypokalemia.  Replaced will monitor closely.      Condition - Extremely Guarded  Family Communication  :  None present  Code Status :  Full  Consults  :  GI  PUD Prophylaxis : PPI   Procedures  :     EGD - Impression:               - White nummular lesions in esophageal mucosa.                            Brushings performed.                           - 2 cm hiatal hernia.                           - A single non-bleeding angioectasia in the fundus.                            Treated with argon plasma coagulation (APC).                           - Non-bleeding gastric ulcers with a clean ulcer                            base ([place] Class III).                           - Gastritis. Biopsied.                           - No  gross lesions in the duodenal bulb, in the                            first portion of the duodenum and in the second                            portion of the duodenum. Recommendation:           - Proceed to scheduled Flexible Sigmoidoscopy.                           - PO PPI twice daily.                           -  Carafate twice daily for 2-weeks.                           - Consider repeat EGD with for consideration of                            ulcer healing evaluation in 4 months though may                            want to forego that based on patient's age and                            other medical comorbidities.  Flex Sig   Impression:               - Preparation of the colon was inadequate.                           - Hemorrhoids found on digital rectal exam.                           - Blood in the rectum, in the recto-sigmoid colon,                            in the sigmoid colon and in the descending colon.                           - Diverticulosis in the recto-sigmoid colon and in                            the sigmoid colon.                           - Non-bleeding non-thrombosed external and internal                            hemorrhoids. Recommendation:           - The patient will be observed post-procedure,                            until all discharge criteria are met.                           - Return patient to hospital ward for ongoing care.                           - Recommend full colonoscopy to evaluate colon for                            potential source of GI blood loss. With the maroon                            stools noted on today's examination, her  intermittent reported dark stools, I have concerned                            that there could be a right-sided lesion that is                            the source of issues. If her colonoscopy is clear,                            then we will consider video capsule  endoscopy                            swallowing. Plan will be for tomorrow.                           -If patient has significant hemodynamicly unstable                            bleeding, she needs to go for CT angio GI bleed                            scan. Expect that as she cleans out for her                            colonoscopy tonight into tomorrow that she will                            have some old blood per rectum.  Colonoscopy.      Disposition Plan  :    Status is: Observation  DVT Prophylaxis  :    Place and maintain sequential compression device Start: 09/26/22 0548 SCDs Start: 09/25/22 0919    Lab Results  Component Value Date   PLT 186 09/27/2022    Diet :  Diet Order             Diet NPO time specified Except for: Sips with Meds  Diet effective midnight           Diet NPO time specified  Diet effective now                    Inpatient Medications  Scheduled Meds:  amiodarone  200 mg Oral BID   isosorbide-hydrALAZINE  2 tablet Oral TID   levothyroxine  75 mcg Oral QAC breakfast   metoprolol succinate  50 mg Oral Daily   [START ON 09/28/2022] pantoprazole  40 mg Intravenous Q12H   potassium chloride  40 mEq Oral BID   Continuous Infusions:  sodium chloride     dextrose 5 % with KCl 20 mEq / L 20 mEq (09/27/22 0920)   pantoprazole 8 mg/hr (09/27/22 0519)   PRN Meds:.acetaminophen **OR** acetaminophen, ondansetron **OR** ondansetron (ZOFRAN) IV  Antibiotics  :    Anti-infectives (From admission, onward)    None         Objective:   Vitals:   09/27/22 0000 09/27/22 0650 09/27/22 0710 09/27/22 0800  BP: 113/62 129/71 123/71 136/71  Pulse: 93 98  (!) 102  Resp: 18 (!) 22 (!) 24 (!) 24  Temp: 98.2 F (36.8 C) 98.5 F (36.9 C) 98.1 F (36.7 C) 98.9 F (37.2 C)  TempSrc: Oral Oral Oral Oral  SpO2: 99% 97%  98%  Weight:      Height:        Wt Readings from Last 3 Encounters:  09/26/22 62.2 kg  09/22/22 62.2 kg   09/08/22 60.9 kg     Intake/Output Summary (Last 24 hours) at 09/27/2022 0958 Last data filed at 09/27/2022 0650 Gross per 24 hour  Intake 1220 ml  Output 250 ml  Net 970 ml     Physical Exam  Awake Alert, No new F.N deficits, Normal affect [place].AT,PERRAL Supple Neck, No JVD,   Symmetrical Chest wall movement, Good air movement bilaterally, CTAB RRR,No Gallops,Rubs or new Murmurs,  +ve B.Sounds, Abd Soft, No tenderness,   No Cyanosis, Clubbing or edema        Data Review:    Recent Labs  Lab 09/25/22 0230 09/25/22 1304 09/25/22 1838 09/26/22 0822 09/26/22 1026 09/26/22 1552 09/27/22 0358  WBC 6.5 8.3 9.7 8.5  --  9.6 7.5  HGB 3.9* 8.9* 9.3* 8.6* 9.2* 8.4* 7.0*  HCT 13.4* 26.7* 27.0* 26.0* 27.0* 26.3* 21.2*  PLT 263 188 201 192  --  202 186  MCV 99.3 92.7 88.8 91.2  --  93.3 92.2  MCH 28.9 30.9 30.6 30.2  --  29.8 30.4  MCHC 29.1* 33.3 34.4 33.1  --  31.9 33.0  RDW 17.2* 15.6* 15.4 16.1*  --  16.3* 16.2*  LYMPHSABS 0.7  --   --   --   --   --  0.7  MONOABS 0.4  --   --   --   --   --  0.7  EOSABS 0.0  --   --   --   --   --  0.0  BASOSABS 0.0  --   --   --   --   --  0.0    Recent Labs  Lab 09/25/22 0230 09/26/22 0822 09/26/22 1026 09/26/22 1552 09/27/22 0358  NA 137 148* 151* 148* 151*  K 4.0 2.5* 2.7* 3.0* 3.0*  CL 106 116* 112* 115* 120*  CO2 14* 22  --  21* 23  ANIONGAP 17* 10  --  12 8  GLUCOSE 125* 104* 107* 165* 146*  BUN 83* 44* 38* 37* 27*  CREATININE 1.87* 1.18* 1.20* 1.16* 1.00  AST 45*  --   --   --   --   ALT 35  --   --   --   --   ALKPHOS 31*  --   --   --   --   BILITOT 0.8  --   --   --   --   ALBUMIN 3.2*  --   --   --   --   BNP 467.9* 846.3*  --   --  561.6*  MG  --  2.5*  --   --  2.4  CALCIUM 8.9 8.3*  --  8.1* 7.9*   Radiology Reports DG CHEST PORT 1 VIEW  Result Date: 09/25/2022 CLINICAL DATA:  Shortness of breath, atrial fibrillation EXAM: PORTABLE CHEST 1 VIEW COMPARISON:  Previous studies including the examination  done on 01/09/2022 FINDINGS: Transverse diameter of heart is increased. There are no signs of pulmonary edema or focal pulmonary consolidation. There is no significant pleural effusion or pneumothorax. Degenerative changes with small bony spurs are noted in both shoulders. IMPRESSION: There are no  signs of pulmonary edema or focal pulmonary consolidation. Electronically Signed   By: Elmer Picker M.D.   On: 09/25/2022 09:40      Signature  -   Lala Lund M.D on 09/27/2022 at 9:58 AM   -  To page go to [email or web address]

## 2022-09-27 NOTE — Interval H&P Note (Signed)
History and Physical Interval Note:  09/27/2022 12:01 PM  Janice Small  has presented today for surgery, with the diagnosis of GI bleed.  The various methods of treatment have been discussed with the patient and family. After consideration of risks, benefits and other options for treatment, the patient has consented to  Procedure(s): COLONOSCOPY WITH PROPOFOL (N/A) as a surgical intervention.  The patient's history has been reviewed, patient examined, no change in status, stable for surgery.  I have reviewed the patient's chart and labs.  Questions were answered to the patient's satisfaction.     [employer]

## 2022-09-27 NOTE — Interval H&P Note (Signed)
History and Physical Interval Note:  09/27/2022 12:01 PM  Janice Small  has presented today for surgery, with the diagnosis of GI bleed.  The various methods of treatment have been discussed with the patient and family. After consideration of risks, benefits and other options for treatment, the patient has consented to  Procedure(s): COLONOSCOPY WITH PROPOFOL (N/A) as a surgical intervention.  The patient's history has been reviewed, patient examined, no change in status, stable for surgery.  I have reviewed the patient's chart and labs.  Questions were answered to the patient's satisfaction.    Possible VCE if the patient does not have an etiology found on colonoscopy.   [employer]

## 2022-09-27 NOTE — Op Note (Signed)
[facility] Patient Name: Janice Small Procedure Date : 09/27/2022 MRN: [id number] Attending MD: Justice Britain , MD, [id number] Date of Birth: Jul 21, 1927 CSN: [id number] Age: 89 Admit Type: Inpatient Procedure:                Colonoscopy Indications:              Evaluation of unexplained GI bleeding presenting                            with Hematochezia, Evaluation of unexplained GI                            bleeding presenting with fecal occult blood, Acute                            post hemorrhagic anemia Providers:                Justice Britain, MD, Grace Isaac, RN, Darliss Cheney, Technician Referring MD:             Inpatient medical service Medicines:                Monitored Anesthesia Care Complications:            No immediate complications. Estimated Blood Loss:     Estimated blood loss was minimal. Procedure:                Pre-Anesthesia Assessment:                           - Prior to the procedure, a History and Physical                            was performed, and patient medications and                            allergies were reviewed. The patient's tolerance of                            previous anesthesia was also reviewed. The risks                            and benefits of the procedure and the sedation                            options and risks were discussed with the patient.                            All questions were answered, and informed consent                            was obtained. Prior Anticoagulants: The patient has  taken Eliquis (apixaban), last dose was 2 days                            prior to procedure. ASA Grade Assessment: III - A                            patient with severe systemic disease. After                            reviewing the risks and benefits, the patient was                            deemed in satisfactory condition to undergo the                             procedure.                           After obtaining informed consent, the colonoscope                            was passed under direct vision. Throughout the                            procedure, the patient's blood pressure, pulse, and                            oxygen saturations were monitored continuously. The                            [id number] Olympus peds colonoscope was                            introduced through the anus and advanced to the 3                            cm into the ileum. The colonoscopy was somewhat                            difficult due to a tortuous colon. Successful                            completion of the procedure was aided by changing                            the patient's position, using manual pressure,                            withdrawing and reinserting the scope,                            straightening and shortening the scope to obtain  bowel loop reduction and using scope torsion. The                            patient tolerated the procedure. The quality of the                            bowel preparation was fair. The terminal ileum,                            ileocecal valve, appendiceal orifice, and rectum                            were photographed. Scope In: 12:30:03 PM Scope Out: 12:58:35 PM Scope Withdrawal Time: 0 hours 16 minutes 59 seconds  Total Procedure Duration: 0 hours 28 minutes 32 seconds  Findings:      The digital rectal exam findings include hemorrhoids. Pertinent       negatives include no palpable rectal lesions.      The colon (entire examined portion) revealed grossly excessive       tortuosity and looping.      A large amount of semi-liquid stool and maroon old blood and blood clots       was found in the entire colon, interfering with visualization. Lavage of       the area was performed using copious amounts, suction via endoscope was        performed, resulting in clearance with fair visualization.      The visualized 3 cm of terminal ileum and ileocecal valve appeared       normal. I did not see overt blood pour out of the small bowel however.      Three small to medium-sized angioectasias with typical arborization were       found in the ascending colon. Fulguration to ablate the lesion to       prevent bleeding by argon plasma was successful. On the largest AVM, to       prevent bleeding post-intervention, one hemostatic clip was successfully       placed (MR conditional). Clip manufacturer: [employer]. There was       no bleeding during, or at the end, of the procedure.      Multiple small-mouthed diverticula were found in the ascending colon and       hepatic flexure and then the distal descending colon, sigmoid colon,       rectosigmoid colon.      There was no evidence of any mass or significant lesion or significant       polyp that was noted on the examination. Although with fair preparation,       visualization of various flat or small lesions could have been missed..      Non-bleeding non-thrombosed external and internal hemorrhoids were found       during retroflexion, during perianal exam and during digital exam. The       hemorrhoids were Grade III (internal hemorrhoids that prolapse but       require manual reduction). Impression:               - Preparation of the colon was only fair after  extensive/copious lavage.                           - Hemorrhoids found on digital rectal exam.                           - There was significant tortuosity and looping of                            the colon.                           - Stool and old maroon blood noted in the entire                            examined colon. Lavaged.                           - The examined portion of the distal terminal ileum                            was normal.                           - Three colonic  angioectasias. Treated with argon                            plasma coagulation (APC). 1 clip placed on the                            largest AVM.                           - Diverticulosis in the AC/HF/DC//RS colon.                           -No large masses or lesions were felt to be seen on                            today's examination.                           - Non-bleeding non-thrombosed external and internal                            hemorrhoids.                           -The caveat to this is that the patient's                            preparation was only fair so in theory, this would                            not count as a screening evaluation (which it was  not intended for). Recommendation:           - Allow patient to awaken and will move forward                            with her swallowing video capsule endoscopy.                           - Trend hemoglobin/hematocrit.                           - N.p.o. for 2 hours. Clear liquids for 2 hours.                            Small snack for 2 hours then full liquid diet                            thereafter.                           - We will pick up the video capsule endoscopy                            tomorrow. Hopefully will be able to read it to plan                            next step in GI bleeding evaluation. CT                            enterography likely to be considered but I would                            allow the video capsule endoscopy to be completed                            first.                           - The findings and recommendations were discussed                            with the patient.                           - The findings and recommendations were discussed                            with the referring physician. Procedure Code(s):        --- Professional ---                           [phone], Colonoscopy, flexible; with control of                             bleeding, any method Diagnosis Code(s):        ---  Professional ---                           K64.2, Third degree hemorrhoids                           K55.20, Angiodysplasia of colon without hemorrhage                           K92.1, Melena (includes Hematochezia)                           R19.5, Other fecal abnormalities                           D62, Acute posthemorrhagic anemia                           K57.30, Diverticulosis of large intestine without                            perforation or abscess without bleeding CPT copyright 2022 American Medical Association. All rights reserved. The codes documented in this report are preliminary and upon coder review may  be revised to meet current compliance requirements. Justice Britain, MD 09/27/2022 1:21:03 PM Number of Addenda: 0

## 2022-09-27 NOTE — Transfer of Care (Signed)
Immediate Anesthesia Transfer of Care Note  Patient: Janice Small  Procedure(s) Performed: COLONOSCOPY WITH PROPOFOL GIVENS CAPSULE STUDY HOT HEMOSTASIS (ARGON PLASMA COAGULATION/BICAP) HEMOSTASIS CLIP PLACEMENT  Patient Location: Endoscopy Unit  Anesthesia Type:MAC  Level of Consciousness: awake, alert , and oriented  Airway & Oxygen Therapy: Patient Spontanous Breathing  Post-op Assessment: Report given to RN, Post -op Vital signs reviewed and stable, and Patient moving all extremities  Post vital signs: Reviewed and stable  Last Vitals:  Vitals Value Taken Time  BP 112/100 09/27/22 1315  Temp 36.4 C 09/27/22 1315  Pulse 89 09/27/22 1315  Resp 19 09/27/22 1315  SpO2 98 % 09/27/22 1315    Last Pain:  Vitals:   09/27/22 1315  TempSrc: Temporal  PainSc: 0-No pain         Complications: No notable events documented.

## 2022-09-27 NOTE — Anesthesia Preprocedure Evaluation (Signed)
Anesthesia Evaluation  Patient identified by MRN, date of birth, ID band Patient awake    Reviewed: Allergy & Precautions, NPO status , Patient's Chart, lab work & pertinent test results, reviewed documented beta blocker date and time   Airway Mallampati: II  TM Distance: >3 FB Neck ROM: Full    Dental  (+) Teeth Intact, Dental Advisory Given   Pulmonary neg pulmonary ROS   Pulmonary exam normal breath sounds clear to auscultation       Cardiovascular hypertension, Pt. on medications and Pt. on home beta blockers +CHF  Normal cardiovascular exam+ dysrhythmias Atrial Fibrillation  Rhythm:Regular Rate:Normal     Neuro/Psych negative neurological ROS  negative psych ROS   GI/Hepatic Neg liver ROS,,,GIB   Endo/Other  Hypothyroidism    Renal/GU Renal InsufficiencyRenal disease     Musculoskeletal negative musculoskeletal ROS (+)    Abdominal   Peds  Hematology  (+) Blood dyscrasia (Xarelto), anemia   Anesthesia Other Findings Day of surgery medications reviewed with the patient.  Reproductive/Obstetrics                             Anesthesia Physical Anesthesia Plan  ASA: 4  Anesthesia Plan: MAC   Post-op Pain Management: Minimal or no pain anticipated   Induction: Intravenous  PONV Risk Score and Plan: 2 and TIVA and Treatment may vary due to age or medical condition  Airway Management Planned: Natural Airway and Simple Face Mask  Additional Equipment:   Intra-op Plan:   Post-operative Plan:   Informed Consent: I have reviewed the patients History and Physical, chart, labs and discussed the procedure including the risks, benefits and alternatives for the proposed anesthesia with the patient or authorized representative who has indicated his/her understanding and acceptance.     Dental advisory given  Plan Discussed with: CRNA and Anesthesiologist  Anesthesia Plan Comments:         Anesthesia Quick Evaluation

## 2022-09-27 NOTE — Progress Notes (Signed)
OT Cancellation Note  Patient Details Name: Janice Small MRN: [id number] DOB: February 25, 1928   Cancelled Treatment:    Reason Eval/Treat Not Completed: Patient at procedure or test/ unavailable (endoscopy) OT to follow for OT eval.  Jefferey Pica, OTR/L Acute Rehabilitation Services Office: [place] 09/27/2022, 1:13 PM

## 2022-09-28 DIAGNOSIS — K922 Gastrointestinal hemorrhage, unspecified: Secondary | ICD-10-CM | POA: Diagnosis not present

## 2022-09-28 LAB — TYPE AND SCREEN
ABO/RH(D): O POS
Antibody Screen: NEGATIVE
Unit division: 0
Unit division: 0
Unit division: 0
Unit division: 0

## 2022-09-28 LAB — BPAM RBC
Blood Product Expiration Date: 202404042359
Blood Product Expiration Date: 202404052359
Blood Product Expiration Date: 202404072359
Blood Product Expiration Date: 202404092359
ISSUE DATE / TIME: 202403140438
ISSUE DATE / TIME: 202403140633
ISSUE DATE / TIME: 202403141014
ISSUE DATE / TIME: 202403160631
Unit Type and Rh: 5100
Unit Type and Rh: 5100
Unit Type and Rh: 5100
Unit Type and Rh: 5100

## 2022-09-28 LAB — CBC WITH DIFFERENTIAL/PLATELET
Abs Immature Granulocytes: 0.04 10*3/uL (ref 0.00–0.07)
Basophils Absolute: 0 10*3/uL (ref 0.0–0.1)
Basophils Relative: 0 %
Eosinophils Absolute: 0.2 10*3/uL (ref 0.0–0.5)
Eosinophils Relative: 3 %
HCT: 25 % — ABNORMAL LOW (ref 36.0–46.0)
Hemoglobin: 8.3 g/dL — ABNORMAL LOW (ref 12.0–15.0)
Immature Granulocytes: 1 %
Lymphocytes Relative: 18 %
Lymphs Abs: 1.3 10*3/uL (ref 0.7–4.0)
MCH: 31.3 pg (ref 26.0–34.0)
MCHC: 33.2 g/dL (ref 30.0–36.0)
MCV: 94.3 fL (ref 80.0–100.0)
Monocytes Absolute: 0.6 10*3/uL (ref 0.1–1.0)
Monocytes Relative: 8 %
Neutro Abs: 5.1 10*3/uL (ref 1.7–7.7)
Neutrophils Relative %: 70 %
Platelets: 156 10*3/uL (ref 150–400)
RBC: 2.65 MIL/uL — ABNORMAL LOW (ref 3.87–5.11)
RDW: 16 % — ABNORMAL HIGH (ref 11.5–15.5)
WBC: 7.3 10*3/uL (ref 4.0–10.5)
nRBC: 2.5 % — ABNORMAL HIGH (ref 0.0–0.2)

## 2022-09-28 LAB — BASIC METABOLIC PANEL
Anion gap: 5 (ref 5–15)
BUN: 19 mg/dL (ref 8–23)
CO2: 22 mmol/L (ref 22–32)
Calcium: 7.5 mg/dL — ABNORMAL LOW (ref 8.9–10.3)
Chloride: 113 mmol/L — ABNORMAL HIGH (ref 98–111)
Creatinine, Ser: 0.95 mg/dL (ref 0.44–1.00)
GFR, Estimated: 56 mL/min — ABNORMAL LOW (ref >60)
Glucose, Bld: 120 mg/dL — ABNORMAL HIGH (ref 70–99)
Potassium: 3.6 mmol/L (ref 3.5–5.1)
Sodium: 140 mmol/L (ref 135–145)

## 2022-09-28 LAB — CBC
HCT: 26.7 % — ABNORMAL LOW (ref 36.0–46.0)
Hemoglobin: 8.5 g/dL — ABNORMAL LOW (ref 12.0–15.0)
MCH: 30.6 pg (ref 26.0–34.0)
MCHC: 31.8 g/dL (ref 30.0–36.0)
MCV: 96 fL (ref 80.0–100.0)
Platelets: 158 10*3/uL (ref 150–400)
RBC: 2.78 MIL/uL — ABNORMAL LOW (ref 3.87–5.11)
RDW: 15.9 % — ABNORMAL HIGH (ref 11.5–15.5)
WBC: 7.1 10*3/uL (ref 4.0–10.5)
nRBC: 1.8 % — ABNORMAL HIGH (ref 0.0–0.2)

## 2022-09-28 LAB — MAGNESIUM: Magnesium: 2.1 mg/dL (ref 1.7–2.4)

## 2022-09-28 LAB — BRAIN NATRIURETIC PEPTIDE: B Natriuretic Peptide: 388.3 pg/mL — ABNORMAL HIGH (ref 0.0–100.0)

## 2022-09-28 MED ORDER — CHLORHEXIDINE GLUCONATE CLOTH 2 % EX PADS
6.0000 | MEDICATED_PAD | Freq: Every day | CUTANEOUS | Status: DC
  Administered 2022-09-28 – 2022-10-01 (×4): 6 via TOPICAL

## 2022-09-28 MED ORDER — METOPROLOL SUCCINATE ER 25 MG PO TB24
25.0000 mg | ORAL_TABLET | Freq: Every day | ORAL | Status: DC
  Administered 2022-09-28 – 2022-09-29 (×2): 25 mg via ORAL
  Filled 2022-09-28 (×2): qty 1

## 2022-09-28 MED ORDER — FUROSEMIDE 10 MG/ML IJ SOLN
10.0000 mg | Freq: Once | INTRAMUSCULAR | Status: AC
  Administered 2022-09-28: 10 mg via INTRAVENOUS
  Filled 2022-09-28: qty 2

## 2022-09-28 NOTE — Evaluation (Signed)
Occupational Therapy Evaluation Patient Details Name: Janice Small MRN: [id number] DOB: 1928/02/09 Today's Date: 09/28/2022   History of Present Illness 89 y/o F admitted to [facility] on 3/14 for weakness, along with lightheadedness, episodes of black stool, and dyspnea on exertion. Hgb found to be 3.9, improved after 3 units of PRBCs. S/p EGD and flexible sigmoidoscopy 3/15. PMHx: chronic a-fib on Xarelto, HTN, hypothyroidism, diastolic CHF, primary hyperparathyroidism, prolapsing bleeding hemorrhoids s/p banding.   Clinical Impression   PTA, pt lived alone and was independent in ADL and IADL within the home. Upon eval, pr presents with decreased activity tolerance, balance, strength, safety, and problem solving. Pt performing UB ADL with set-up A and LB ADL with min-mod A. Pt requiring min A due to need for tactile cues for transfers this session. Due to need for hands on assist, need to navigate full flight of stairs, and tub shower transfer with decr activity tolerance, recommending SNF for continued OT services. May be able to transition home if family can provide increased support for morning and night time routines.     Recommendations for follow up therapy are one component of a multi-disciplinary discharge planning process, led by the attending physician.  Recommendations may be updated based on patient status, additional functional criteria and insurance authorization.   Follow Up Recommendations  Skilled nursing-short term rehab (<3 hours/day)     Assistance Recommended at Discharge Intermittent Supervision/Assistance  Patient can return home with the following A little help with walking and/or transfers;A little help with bathing/dressing/bathroom;Assist for transportation;Help with stairs or ramp for entrance;Direct supervision/assist for financial management;Direct supervision/assist for medications management;Assistance with cooking/housework    Functional Status Assessment  Patient  has had a recent decline in their functional status and demonstrates the ability to make significant improvements in function in a reasonable and predictable amount of time.  Equipment Recommendations  BSC/3in1    Recommendations for Other Services       Precautions / Restrictions Precautions Precautions: Fall Restrictions Weight Bearing Restrictions: No      Mobility Bed Mobility Overal bed mobility: Needs Assistance Bed Mobility: Supine to Sit, Sit to Supine     Supine to sit: Min assist, HOB elevated     General bed mobility comments: Min A for bringing RLE toward EOB    Transfers Overall transfer level: Needs assistance Equipment used: Rolling walker (2 wheels) Transfers: Sit to/from Stand Sit to Stand: Min guard, Min assist                  Balance Overall balance assessment: Needs assistance Sitting-balance support: No upper extremity supported, Feet supported Sitting balance-Leahy Scale: Fair     Standing balance support: Bilateral upper extremity supported, During functional activity, Reliant on assistive device for balance Standing balance-Leahy Scale: Poor Standing balance comment: relient on RW during dynamic movement. Able to static stand at sink for oral care                           ADL either performed or assessed with clinical judgement   ADL Overall ADL's : Needs assistance/impaired Eating/Feeding: Supervision/ safety;Sitting Eating/Feeding Details (indicate cue type and reason): at EOB Grooming: Oral care;Supervision/safety;Standing Grooming Details (indicate cue type and reason): at sink Upper Body Bathing: Set up;Sitting   Lower Body Bathing: Minimal assistance;Sit to/from stand Lower Body Bathing Details (indicate cue type and reason): min A for rise Upper Body Dressing : Set up;Sitting   Lower Body Dressing: Moderate assistance;Sit  to/from stand Lower Body Dressing Details (indicate cue type and reason): Pt unable to  reach feet at time of eval Toilet Transfer: Minimal assistance;Rolling walker (2 wheels) Toilet Transfer Details (indicate cue type and reason): MIn A up from [place] and Hygiene: Supervision/safety;Sitting/lateral lean       Functional mobility during ADLs: Min guard;Rolling walker (2 wheels)       Vision Baseline Vision/History: 0 No visual deficits Ability to See in Adequate Light: 1 Impaired Patient Visual Report: No change from baseline Vision Assessment?: No apparent visual deficits Additional Comments: Pt reports problem with fluid behind retina of her L eye. REports that it does not affect her ability to see or read     Perception Perception Perception Tested?: No   Praxis      Pertinent Vitals/Pain Pain Assessment Pain Assessment: Faces Faces Pain Scale: No hurt Pain Intervention(s): Monitored during session     Hand Dominance Right   Extremity/Trunk Assessment Upper Extremity Assessment Upper Extremity Assessment: Generalized weakness   Lower Extremity Assessment Lower Extremity Assessment: Defer to PT evaluation   Cervical / Trunk Assessment Cervical / Trunk Assessment: Kyphotic   Communication Communication Communication: No difficulties   Cognition Arousal/Alertness: Awake/alert Behavior During Therapy: WFL for tasks assessed/performed Overall Cognitive Status: Impaired/Different from baseline Area of Impairment: Orientation, Problem solving, Following commands, Safety/judgement                 Orientation Level: Disoriented to, Time     Following Commands: Follows one step commands with increased time Safety/Judgement: Decreased awareness of deficits   Problem Solving: Requires verbal cues General Comments: not oriented to day of the week, but does well with month and year. Pleasant throughout, requiring verbal cues for safety with RW as she typically does not ues AD. Does well with keeping up with medical  updates while hospitalized. Pt eatiing liquid breakfast at end of session and stating "Do you know if I am able to have this strawberry ice pop because it is red"     General Comments  VSS on RA    Exercises     Shoulder Instructions      Home Living Family/patient expects to be discharged to:: Private residence Living Arrangements: Alone Available Help at Discharge: Family;Available PRN/intermittently Type of Home: House Home Access: Stairs to enter [employer] of Steps: 2 Entrance Stairs-Rails: None Home Layout: Two level;Bed/bath upstairs Alternate Level Stairs-Number of Steps: 12 Alternate Level Stairs-Rails: Right Bathroom Shower/Tub: Teacher, early years/pre: Standard     Home Equipment: Conservation officer, nature (2 wheels);Rollator (4 wheels);Cane - single point   Additional Comments: pt lives alone, one son lives down the road and checks in on pt as needed, provides transportation      Prior Functioning/Environment Prior Level of Function : Needs assist             Mobility Comments: pt ambulates with SPC in the community, often no AD in her home, son provides transportation, pt and son deny any falls          OT Problem List: Decreased strength;Decreased activity tolerance;Impaired balance (sitting and/or standing);Decreased safety awareness;Decreased knowledge of use of DME or AE      OT Treatment/Interventions: Self-care/ADL training;Therapeutic exercise;DME and/or AE instruction;Balance training;Patient/family education;Therapeutic activities    OT Goals(Current goals can be found in the care plan section) Acute Rehab OT Goals Patient Stated Goal: get better OT Goal Formulation: With patient Time For Goal Achievement: 10/12/22 Potential to  Achieve Goals: Good  OT Frequency: Min 2X/week    Co-evaluation              AM-PAC OT "6 Clicks" Daily Activity     Outcome Measure Help from another person eating meals?: None Help from  another person taking care of personal grooming?: A Little Help from another person toileting, which includes using toliet, bedpan, or urinal?: A Little Help from another person bathing (including washing, rinsing, drying)?: A Little Help from another person to put on and taking off regular upper body clothing?: A Little Help from another person to put on and taking off regular lower body clothing?: A Little 6 Click Score: 19   End of Session Equipment Utilized During Treatment: Gait belt;Rolling walker (2 wheels) Nurse Communication: Mobility status  Activity Tolerance: Patient tolerated treatment well Patient left: in bed;with call bell/phone within reach;with bed alarm set  OT Visit Diagnosis: Unsteadiness on feet (R26.81);Muscle weakness (generalized) (M62.81);Pain Pain - part of body:  (bil anterior hips)                Time: [id number] OT Time Calculation (min): 28 min Charges:  OT General Charges $OT Visit: 1 Visit OT Evaluation $OT Eval Low Complexity: 1 Low OT Treatments $Self Care/Home Management : 8-22 mins  Elder Cyphers, OTR/L [facility] Acute Rehabilitation Office: [phone]   Magnus Ivan 09/28/2022, 8:50 AM

## 2022-09-28 NOTE — Progress Notes (Signed)
Some issues with having the video capsule endoscopy completely evaluated today. He will need to be formally read tomorrow. Will let the patient have a diet today to see what things look like. Further recommendations after video capsule endoscopy has been read in regards to when patient may be able to reinitiate anticoagulation. Dr. Silverio Decamp takes over the inpatient [facility] service tomorrow.   Justice Britain, MD [facility] Advanced Endoscopy Office # [phone]

## 2022-09-28 NOTE — Progress Notes (Signed)
PROGRESS NOTE                                                                                                                                                                                                             Patient Demographics:    Janice Small, is a 89 y.o. female, DOB - 1928/03/03, [id number]  Outpatient Primary MD for the patient is [place], Blondell Reveal, MD    LOS - 2  Admit date - 09/25/2022    Chief Complaint  Patient presents with   Weakness       Brief Narrative (HPI from H&P)    89 y.o. female with medical history significant of atrial fibrillation on xarelto, HTN, hypothyroidism, diastolic CHF, primary hyperparathyroidism who presented to ED with complaints of weakness, apparently was having dark tarry stools for the past 3 to 4 days in the ER she was found to be severely anemic with elevated BUN suggesting upper GI bleed and was admitted to the hospital.   Subjective:   Patient in bed denies any headache chest or abdominal pain, had a bowel movement which was dark with some blood in it, no shortness of breath.  No focal weakness   Assessment  & Plan :    Acute blood loss related anemia likely due to acute upper GI bleed in the setting of Xarelto use.  She had elevated BUN with history of daily NSAID use and Xarelto use, likely had upper GI bleed, s/p 3 units of packed red blood transfusion on 09/25/2022, another unit on 09/27/2022.  Continue IV PPI.  She underwent on 09/26/2022 EGD noted with hiatal hernia, nonbleeding gastric ulcer with gastritis, a single non-bleeding angioectasia in the fundus, treated with argon plasma coagulation.  Flex sig and colonoscopy with no evidence of ongoing bleeding except some thrombosed hemorrhoids, due for capsule endoscopy on 09/28/2022.  She still seems to have reoccurrence of some bleeding night of 09/26/2022, getting another unit of packed RBC on 09/27/2022 along with  colonoscopy.  Continue PPI and close CBC monitoring.  Acute blood loss anemia - as above   Dehydration, hypernatremia with acute renal failure superimposed on stage 3b chronic kidney disease ([place]) -  Likely pre-renal/intra renal in setting of hypoperfusion of the kidney with acute blood loss anemia, hold NSAID, Farxiga, renal function much improved.  Resolved after hydration.  Elevated troponin - Troponin flat with no chest pain or changes on EKG, not indicative of ACS. Likely demand ischemia in setting of acute anemia, posttransfusion completely symptom-free and stable.    Chronic diastolic CHF servant EF of 60% on recent echocardiogram- She has worsening dyspnea on exertion and elevated BNP likely CHF exacerbated by anemia, does have edema, TED stockings, diurese as tolerated by blood pressure and renal function.  Lactic acidosis - Likely secondary to hypovolemia from acute GI bleed.   Chr. Atrial fibrillation (HCC) - Rate controlled, Hold xarelto, CHA2DS2-VASc score is 5. Amiodarone started and will take 200mg  BID until 10/07/2022, DCCV pending for April 2024. Conitnue metoprolol and amiodarone for rate control   Essential hypertension - blood pressure mildly elevated, continue beta-blocker, discontinue Norvasc due to 3+ peripheral edema, will substituted with BiDil.  Hypothyroidism - TSH wnl 07/2022, Continue synthroid 3mcg daily   Severe hypokalemia.  Replaced will monitor closely.      Condition - Extremely Guarded  Family Communication  :  None present  Code Status :  Full  Consults  :  GI  PUD Prophylaxis : PPI   Procedures  :     EGD - Impression:               - White nummular lesions in esophageal mucosa.                            Brushings performed.                           - 2 cm hiatal hernia.                           - A single non-bleeding angioectasia in the fundus.                            Treated with argon plasma coagulation (APC).                            - Non-bleeding gastric ulcers with a clean ulcer                            base ([place] Class III).                           - Gastritis. Biopsied.                           - No gross lesions in the duodenal bulb, in the                            first portion of the duodenum and in the second                            portion of the duodenum. Recommendation:           - Proceed to scheduled Flexible Sigmoidoscopy.                           - PO PPI twice daily.                           -  Carafate twice daily for 2-weeks.                           - Consider repeat EGD with for consideration of                            ulcer healing evaluation in 4 months though may                            want to forego that based on patient's age and                            other medical comorbidities.  Flex Sig   Impression:               - Preparation of the colon was inadequate.                           - Hemorrhoids found on digital rectal exam.                           - Blood in the rectum, in the recto-sigmoid colon,                            in the sigmoid colon and in the descending colon.                           - Diverticulosis in the recto-sigmoid colon and in                            the sigmoid colon.                           - Non-bleeding non-thrombosed external and internal                            hemorrhoids. Recommendation:           - The patient will be observed post-procedure,                            until all discharge criteria are met.                           - Return patient to hospital ward for ongoing care.                           - Recommend full colonoscopy to evaluate colon for                            potential source of GI blood loss. With the maroon                            stools noted on today's examination, her  intermittent reported dark stools, I have concerned                            that  there could be a right-sided lesion that is                            the source of issues. If her colonoscopy is clear,                            then we will consider video capsule endoscopy                            swallowing. Plan will be for tomorrow.                           -If patient has significant hemodynamicly unstable                            bleeding, she needs to go for CT angio GI bleed                            scan. Expect that as she cleans out for her                            colonoscopy tonight into tomorrow that she will                            have some old blood per rectum.  Colonoscopy.  Impression:               - Preparation of the colon was only fair after                            extensive/copious lavage.                           - Hemorrhoids found on digital rectal exam.                           - There was significant tortuosity and looping of                            the colon.                           - Stool and old maroon blood noted in the entire                            examined colon. Lavaged.                           - The examined portion of the distal terminal ileum                            was  normal.                           - Three colonic angioectasias. Treated with argon                            plasma coagulation (APC). 1 clip placed on the                            largest AVM.                           - Diverticulosis in the AC/HF/DC/[place]/RS colon.                           -No large masses or lesions were felt to be seen on                            today's examination.                           - Non-bleeding non-thrombosed external and internal                            hemorrhoids.                           -The caveat to this is that the patient's                            preparation was only fair so in theory, this would                            not count as a screening evaluation (which it was                             not intended for). Recommendation:           - Allow patient to awaken and will move forward                            with her swallowing video capsule endoscopy.                           - Trend hemoglobin/hematocrit.                           - N.p.o. for 2 hours. Clear liquids for 2 hours.                            Small snack for 2 hours then full liquid diet                            thereafter.                           -  We will pick up the video capsule endoscopy                            tomorrow. Hopefully will be able to read it to plan                            next step in GI bleeding evaluation. CT                            enterography likely to be considered but I would                            allow the video capsule endoscopy to be completed                            first.      Disposition Plan  :    Status is: Observation  DVT Prophylaxis  :    Place and maintain sequential compression device Start: 09/26/22 0548 SCDs Start: 09/25/22 0919    Lab Results  Component Value Date   PLT 156 09/28/2022    Diet :  Diet Order             Diet full liquid Room service appropriate? Yes; Fluid consistency: Thin  Diet effective now                    Inpatient Medications  Scheduled Meds:  amiodarone  200 mg Oral BID   Chlorhexidine Gluconate Cloth  6 each Topical Daily   furosemide  10 mg Intravenous Once   isosorbide-hydrALAZINE  2 tablet Oral TID   levothyroxine  75 mcg Oral QAC breakfast   metoprolol succinate  25 mg Oral Daily   pantoprazole  40 mg Intravenous Q12H   Continuous Infusions:   PRN Meds:.acetaminophen **OR** acetaminophen, alum & mag hydroxide-simeth, ondansetron **OR** ondansetron (ZOFRAN) IV, simethicone  Antibiotics  :    Anti-infectives (From admission, onward)    None         Objective:   Vitals:   09/27/22 2300 09/28/22 0400 09/28/22 0800 09/28/22 0812  BP: 106/65 101/70 109/66    Pulse: 82 75 79   Resp: (!) 25 13 (!) 25   Temp: 98.4 F (36.9 C)   98.6 F (37 C)  TempSrc: Oral   Oral  SpO2: 96% 96% 96%   Weight:      Height:        Wt Readings from Last 3 Encounters:  09/26/22 62.2 kg  09/22/22 62.2 kg  09/08/22 60.9 kg     Intake/Output Summary (Last 24 hours) at 09/28/2022 0923 Last data filed at 09/27/2022 2100 Gross per 24 hour  Intake 2603.77 ml  Output 1150 ml  Net 1453.77 ml     Physical Exam  Awake Alert, No new F.N deficits, Normal affect [place].AT,PERRAL Supple Neck, No JVD,   Symmetrical Chest wall movement, Good air movement bilaterally, CTAB RRR,No Gallops,Rubs or new Murmurs,  +ve B.Sounds, Abd Soft, No tenderness,   No Cyanosis, Clubbing or edema        Data Review:    Recent Labs  Lab 09/25/22 0230 09/25/22 1304 09/26/22 0822 09/26/22 1026 09/26/22 1552 09/27/22 0358 09/27/22 1531 09/28/22 0436  WBC 6.5   < >  8.5  --  9.6 7.5 7.2 7.3  HGB 3.9*   < > 8.6* 9.2* 8.4* 7.0* 9.0* 8.3*  HCT 13.4*   < > 26.0* 27.0* 26.3* 21.2* 26.7* 25.0*  PLT 263   < > 192  --  202 186 176 156  MCV 99.3   < > 91.2  --  93.3 92.2 92.7 94.3  MCH 28.9   < > 30.2  --  29.8 30.4 31.3 31.3  MCHC 29.1*   < > 33.1  --  31.9 33.0 33.7 33.2  RDW 17.2*   < > 16.1*  --  16.3* 16.2* 15.9* 16.0*  LYMPHSABS 0.7  --   --   --   --  0.7  --  1.3  MONOABS 0.4  --   --   --   --  0.7  --  0.6  EOSABS 0.0  --   --   --   --  0.0  --  0.2  BASOSABS 0.0  --   --   --   --  0.0  --  0.0   < > = values in this interval not displayed.    Recent Labs  Lab 09/25/22 0230 09/26/22 0822 09/26/22 1026 09/26/22 1552 09/27/22 0358 09/28/22 0436  NA 137 148* 151* 148* 151* 140  K 4.0 2.5* 2.7* 3.0* 3.0* 3.6  CL 106 116* 112* 115* 120* 113*  CO2 14* 22  --  21* 23 22  ANIONGAP 17* 10  --  12 8 5   GLUCOSE 125* 104* 107* 165* 146* 120*  BUN 83* 44* 38* 37* 27* 19  CREATININE 1.87* 1.18* 1.20* 1.16* 1.00 0.95  AST 45*  --   --   --   --   --   ALT 35  --   --    --   --   --   ALKPHOS 31*  --   --   --   --   --   BILITOT 0.8  --   --   --   --   --   ALBUMIN 3.2*  --   --   --   --   --   BNP 467.9* 846.3*  --   --  561.6* 388.3*  MG  --  2.5*  --   --  2.4 2.1  CALCIUM 8.9 8.3*  --  8.1* 7.9* 7.5*   Radiology Reports DG CHEST PORT 1 VIEW  Result Date: 09/25/2022 CLINICAL DATA:  Shortness of breath, atrial fibrillation EXAM: PORTABLE CHEST 1 VIEW COMPARISON:  Previous studies including the examination done on 01/09/2022 FINDINGS: Transverse diameter of heart is increased. There are no signs of pulmonary edema or focal pulmonary consolidation. There is no significant pleural effusion or pneumothorax. Degenerative changes with small bony spurs are noted in both shoulders. IMPRESSION: There are no signs of pulmonary edema or focal pulmonary consolidation. Electronically Signed   By: Elmer Picker M.D.   On: 09/25/2022 09:40      Signature  -   Lala Lund M.D on 09/28/2022 at 9:23 AM   -  To page go to [email or web address]

## 2022-09-29 ENCOUNTER — Encounter: Payer: Self-pay | Admitting: Gastroenterology

## 2022-09-29 DIAGNOSIS — K922 Gastrointestinal hemorrhage, unspecified: Secondary | ICD-10-CM | POA: Diagnosis not present

## 2022-09-29 DIAGNOSIS — K259 Gastric ulcer, unspecified as acute or chronic, without hemorrhage or perforation: Secondary | ICD-10-CM

## 2022-09-29 DIAGNOSIS — K5520 Angiodysplasia of colon without hemorrhage: Secondary | ICD-10-CM

## 2022-09-29 DIAGNOSIS — K5731 Diverticulosis of large intestine without perforation or abscess with bleeding: Secondary | ICD-10-CM

## 2022-09-29 LAB — MAGNESIUM: Magnesium: 2.1 mg/dL (ref 1.7–2.4)

## 2022-09-29 LAB — BASIC METABOLIC PANEL
Anion gap: 5 (ref 5–15)
BUN: 18 mg/dL (ref 8–23)
CO2: 23 mmol/L (ref 22–32)
Calcium: 7.5 mg/dL — ABNORMAL LOW (ref 8.9–10.3)
Chloride: 107 mmol/L (ref 98–111)
Creatinine, Ser: 1.15 mg/dL — ABNORMAL HIGH (ref 0.44–1.00)
GFR, Estimated: 45 mL/min — ABNORMAL LOW (ref >60)
Glucose, Bld: 104 mg/dL — ABNORMAL HIGH (ref 70–99)
Potassium: 3.6 mmol/L (ref 3.5–5.1)
Sodium: 135 mmol/L (ref 135–145)

## 2022-09-29 LAB — CBC WITH DIFFERENTIAL/PLATELET
Abs Immature Granulocytes: 0.04 10*3/uL (ref 0.00–0.07)
Basophils Absolute: 0 10*3/uL (ref 0.0–0.1)
Basophils Relative: 1 %
Eosinophils Absolute: 0.2 10*3/uL (ref 0.0–0.5)
Eosinophils Relative: 3 %
HCT: 27.1 % — ABNORMAL LOW (ref 36.0–46.0)
Hemoglobin: 8.6 g/dL — ABNORMAL LOW (ref 12.0–15.0)
Immature Granulocytes: 1 %
Lymphocytes Relative: 17 %
Lymphs Abs: 1.1 10*3/uL (ref 0.7–4.0)
MCH: 30.4 pg (ref 26.0–34.0)
MCHC: 31.7 g/dL (ref 30.0–36.0)
MCV: 95.8 fL (ref 80.0–100.0)
Monocytes Absolute: 0.6 10*3/uL (ref 0.1–1.0)
Monocytes Relative: 9 %
Neutro Abs: 4.6 10*3/uL (ref 1.7–7.7)
Neutrophils Relative %: 69 %
Platelets: 150 10*3/uL (ref 150–400)
RBC: 2.83 MIL/uL — ABNORMAL LOW (ref 3.87–5.11)
RDW: 15.8 % — ABNORMAL HIGH (ref 11.5–15.5)
WBC: 6.6 10*3/uL (ref 4.0–10.5)
nRBC: 0.8 % — ABNORMAL HIGH (ref 0.0–0.2)

## 2022-09-29 LAB — BRAIN NATRIURETIC PEPTIDE: B Natriuretic Peptide: 464.8 pg/mL — ABNORMAL HIGH (ref 0.0–100.0)

## 2022-09-29 LAB — SURGICAL PATHOLOGY

## 2022-09-29 MED ORDER — LACTATED RINGERS IV BOLUS
500.0000 mL | Freq: Once | INTRAVENOUS | Status: AC
  Administered 2022-09-29: 500 mL via INTRAVENOUS

## 2022-09-29 MED ORDER — BACITRACIN-NEOMYCIN-POLYMYXIN OINTMENT TUBE
TOPICAL_OINTMENT | Freq: Three times a day (TID) | CUTANEOUS | Status: DC
  Administered 2022-09-29: 1 via TOPICAL
  Filled 2022-09-29: qty 14

## 2022-09-29 NOTE — [place] FL2 (Signed)
[place] MEDICAID FL2 LEVEL OF CARE FORM     IDENTIFICATION  Patient Name: Janice Small Birthdate: [birth year] Sex: female Admission Date (Current Location): 09/25/2022  [facility] and [place] Number:  Herbalist and Address:  [facility] [place], [place] [address] [place], [place] [address]      Provider Number: [id number]  Attending Physician Name and Address:  Thurnell Lose, MD  Relative Name and Phone Number:       Current Level of Care: Hospital Recommended Level of Care: [place] Prior Approval Number:    Date Approved/Denied:   PASRR Number: [id number]  Discharge Plan: SNF    Current Diagnoses: Patient Active Problem List   Diagnosis Date Noted   Hematochezia 09/27/2022   GI bleed 09/25/2022   Chronic diastolic CHF (congestive heart failure) ([place]) 09/25/2022   Elevated troponin 09/25/2022   Acute renal failure superimposed on stage 3b chronic kidney disease ([place]) 09/25/2022   Lactic acidosis 09/25/2022   Acute blood loss anemia 09/25/2022   Dark stools 09/25/2022   Heme positive stool 09/25/2022   Symptomatic anemia 09/25/2022   Hemorrhoids 09/25/2022   Hypercoagulable state due to persistent atrial fibrillation ([place]) 09/22/2022   Rash and nonspecific skin eruption 09/08/2022   Chronic anticoagulation 09/04/2022   Acute on chronic diastolic CHF (congestive heart failure) ([place]) 01/09/2022   Bilateral leg edema 12/23/2021   Tachycardia 12/23/2021   Atrial fibrillation ([place]) 12/23/2021   Ear problem, right 11/04/2021   Insomnia 11/04/2021   Internal and external bleeding hemorrhoids 10/03/2021   Sudden right hearing loss 06/26/2021   Bilateral hearing loss 06/26/2021   Vitamin D deficiency 12/03/2018   Osteoporosis 06/30/2018   Primary hyperparathyroidism ([place]) 06/30/2018   Hypercalcemia 06/16/2011   Internal hemorrhoids 01/17/2010   URI (upper respiratory infection) 07/17/2008   Hypothyroidism 11/27/2006    Essential hypertension 11/27/2006    Orientation RESPIRATION BLADDER Height & Weight     Self, Situation, Time, Place  Normal Indwelling catheter, Continent Weight: 137 lb 2 oz (62.2 kg) Height:  5' (152.4 cm)  BEHAVIORAL SYMPTOMS/MOOD NEUROLOGICAL BOWEL NUTRITION STATUS      Continent Diet (See dc summary)  AMBULATORY STATUS COMMUNICATION OF NEEDS Skin   Limited Assist Verbally Other (Comment) (non pressure wound on abdomen; wound on pretibial)                       Personal Care Assistance Level of Assistance  Bathing, Feeding, Dressing Bathing Assistance: Limited assistance Feeding assistance: Limited assistance Dressing Assistance: Limited assistance     Functional Limitations Info             [place]  PT (By licensed PT), OT (By licensed OT)     PT Frequency: 5x/week OT Frequency: 5x/week            Contractures Contractures Info: Not present    Additional Factors Info  Code Status, Allergies Code Status Info: Full Allergies Info: Codeine Phosphate           Current Medications (09/29/2022):  This is the current hospital active medication list Current Facility-Administered Medications  Medication Dose Route Frequency Provider Last Rate Last Admin   acetaminophen (TYLENOL) tablet 650 mg  650 mg Oral Q6H PRN Orma Flaming, MD       Or   acetaminophen (TYLENOL) suppository 650 mg  650 mg Rectal Q6H PRN Orma Flaming, MD       alum & mag hydroxide-simeth (MAALOX/MYLANTA)  200-200-20 MG/5ML suspension 30 mL  30 mL Oral Q4H PRN Mansy, Jan A, MD   30 mL at 09/27/22 2056   amiodarone (PACERONE) tablet 200 mg  200 mg Oral BID Orma Flaming, MD   200 mg at 09/29/22 [id number]   Chlorhexidine Gluconate Cloth 2 % PADS 6 each  6 each Topical Daily Thurnell Lose, MD   6 each at 09/29/22 0925   isosorbide-hydrALAZINE (BIDIL) 20-37.5 MG per tablet 2 tablet  2 tablet Oral TID Thurnell Lose, MD   2 tablet at 09/29/22 [id number]   lactated ringers  bolus 500 mL  500 mL Intravenous Once Thurnell Lose, MD       levothyroxine (SYNTHROID) tablet 75 mcg  75 mcg Oral QAC breakfast Orma Flaming, MD   75 mcg at 09/29/22 0525   metoprolol succinate (TOPROL-XL) 24 hr tablet 25 mg  25 mg Oral Daily Thurnell Lose, MD   25 mg at 09/29/22 [id number]   neomycin-bacitracin-polymyxin (NEOSPORIN) ointment   Topical TID Thurnell Lose, MD   1 Application at [id number]   ondansetron (ZOFRAN) tablet 4 mg  4 mg Oral Q6H PRN Orma Flaming, MD       Or   ondansetron [facility]) injection 4 mg  4 mg Intravenous Q6H PRN Orma Flaming, MD       pantoprazole (PROTONIX) injection 40 mg  40 mg Intravenous Q12H Orpah Greek, MD   40 mg at 09/29/22 [id number]   simethicone (MYLICON) chewable tablet 80 mg  80 mg Oral QID PRN Mansy, Jan A, MD   80 mg at 09/27/22 2056     Discharge Medications: Please see discharge summary for a list of discharge medications.  Relevant Imaging Results:  Relevant Lab Results:   Additional Information SSN: [place], [place]

## 2022-09-29 NOTE — TOC Initial Note (Addendum)
Transition of Care [facility]) - Initial/Assessment Note    Patient Details  Name: Janice Small MRN: [id number] Date of Birth: 1927-09-27  Transition of [facility]) CM/SW Contact:    Benard Halsted, LCSW Phone Number: 09/29/2022, 4:09 PM  Clinical Narrative:                 CSW received consult for possible SNF placement at time of discharge. CSW spoke with patient and two sons at bedside. Patient reported that she does not feel up to returning home alone given patient's current physical needs and fall risk. Patient expressed understanding of PT recommendation and is agreeable to SNF placement at time of discharge. Patient's sons report preference for [place]. Son shared patient also has a long term care policy if ever needed. CSW discussed insurance authorization process and will provide Medicare SNF ratings list. CSW will send out referrals for review and provide bed offers as available. Sons request PTAR for transport.   CSW requested [facility] ensure they have a bed available for patient tomorrow if medically stable; awaiting response. CSW will begin insurance process, Ref# [id number] pending facility.   Skilled Nursing Rehab Facilities-   [email or web address]   Ratings out of 5 stars (5 the highest)   Name Address  Phone # [place] Inspection Overall  [facility] [address], [place] 4 5 2 3   [facility]  [address] [place], [place] [phone] 4 2 5 5   [facility] [place], [place] 1 3 1 1   [place], [place] 2 2 4 4   [facility] [address] [place] 2 1 2 1   [place], [place] [phone] 3 3 4 4   [facility] [address] [place] 4 1 3 2   [facility] [address], [place] 4 1 3 2   [address] ([place]) [place],  [address] [phone] 3 1 2 1   [facility] [phone] [address], [phone] 3 1 1 1   [facility] [address] [facility] [phone] 3 2 2 2   [facility] ([facility]) [place]. [address], [place] [phone] 3 1 1 1   [place] [address] [place] [address] 4 2 4 4           [facility] [address] [facility] [phone]      [facility] [place] 4 1 3 2   [facility] [place] [address] [place] 3 1 5 4   [facility], [place] [address], [address] [phone] 1 1 2 1   [facility] [address] [place] [phone] 2 2 4 4           [address] (no [facility]) [place] [address], [place] [phone] 5 5 5 5   [facility] (No Humana) [address], [place] 4 1 4 3   [place] (No UHC) [place], [place] 5 5 5 5   [facility] [address] [place] [phone] 2 3 5 5   [place] [address], [place] 1 1 2 1   [facility] [address], [address] 3 1 1 1   [place], [place] 5 2 5 5   [facility]  [address], [place] 2 2 1 1   [facility] [address] [place] 3 2 1  1  [facility] [place], [place] 2 2 2 2           [facility] [address] [place] [phone] 1 1 1 1   [place] [address] [place]  [phone] 2 4 3 3   [facility] [place] [address], [place] [phone] 3 2 3 3   [place] [address] [place] 2 1 1 1   [place] (No Humana) [address], [place] [phone] 2 2 3 3   [facility]) [address], [place] [phone] 2 1 1 1           [facility] [place], [place] 5 4 5 5   [facility]  [facility])  [address], [place] 2 1 2 1   [facility]) [place] [address], [place] 3 1 4 3   [place] [address] [place] 3 3 4 4   [address] [place], [place] 2 3 1 1   [facility]) [address] [place] [phone] 2 1 4 3      Expected Discharge Plan: [place] Barriers to Discharge: Continued Medical Work up, Insurance Authorization   Patient Goals and CMS Choice Patient states their goals for this hospitalization and ongoing recovery are:: Rehab CMS Medicare.gov Compare Post Acute Care list provided to:: Patient Choice offered to / list presented to : Patient, Adult [place] ownership interest in [facility].provided to:: Patient    Expected Discharge Plan and Services In-house Referral: Clinical Social Work   Post Acute Care Choice: [place] Living arrangements for the past 2 months: [place]                                      Prior Living Arrangements/Services Living arrangements for the past 2 months: Single Family Home Lives with:: Self Patient language and need for interpreter reviewed:: Yes Do you feel safe going back to the place where you live?: Yes      Need for Family Participation in Patient Care: Yes (Comment) Care giver support system in place?: Yes (comment)   Criminal Activity/Legal Involvement Pertinent to Current Situation/Hospitalization: No - Comment as needed  Activities of Daily Living      Permission Sought/Granted Permission sought to share information with : Facility Sport and exercise psychologist, Family Supports Permission granted to share information with : Yes, Verbal Permission Granted  Share Information with NAME: Joey  Permission granted to share info w AGENCY: SNFs  Permission granted to share info w Relationship: Son  Permission  granted to share info w Contact Information: [phone]  Emotional Assessment Appearance:: Appears stated age Attitude/Demeanor/Rapport: Engaged Affect (typically observed): Accepting, Appropriate Orientation: : Oriented to Self, Oriented to Place, Oriented to  Time, Oriented to Situation Alcohol / Substance Use: Not Applicable Psych Involvement: No (comment)  Admission diagnosis:  GI bleed [K92.2] Gastrointestinal hemorrhage, unspecified gastrointestinal hemorrhage type [K92.2] Patient Active Problem List   Diagnosis Date Noted   Diverticulosis of colon with hemorrhage 09/29/2022   AVM (arteriovenous malformation) of colon 09/29/2022   Hematochezia 09/27/2022   GI bleed 09/25/2022   Chronic diastolic CHF (congestive heart failure) ([place]) 09/25/2022   Elevated troponin 09/25/2022   Acute renal failure superimposed on stage 3b chronic kidney disease ([place]) 09/25/2022   Lactic acidosis 09/25/2022  Acute blood loss anemia 09/25/2022   Dark stools 09/25/2022   Heme positive stool 09/25/2022   Symptomatic anemia 09/25/2022   Hemorrhoids 09/25/2022   Hypercoagulable state due to persistent atrial fibrillation ([place]) 09/22/2022   Rash and nonspecific skin eruption 09/08/2022   Chronic anticoagulation 09/04/2022   Acute on chronic diastolic CHF (congestive heart failure) ([place]) 01/09/2022   Bilateral leg edema 12/23/2021   Tachycardia 12/23/2021   Atrial fibrillation ([place]) 12/23/2021   Ear problem, right 11/04/2021   Insomnia 11/04/2021   Internal and external bleeding hemorrhoids 10/03/2021   Sudden right hearing loss 06/26/2021   Bilateral hearing loss 06/26/2021   Vitamin D deficiency 12/03/2018   Osteoporosis 06/30/2018   Primary hyperparathyroidism ([place]) 06/30/2018   Hypercalcemia 06/16/2011   Internal hemorrhoids 01/17/2010   URI (upper respiratory infection) 07/17/2008   Hypothyroidism 11/27/2006   Essential hypertension 11/27/2006   PCP:  de [place], Raymond J,  MD Pharmacy[facility]   [id number] [address], [place] [address] Phone: [phone] Fax: [place] [address] [place] [address] Phone: [phone] Fax: [phone]     Social Determinants of Health (SDOH) Social History: SDOH Screenings   Food Insecurity: No Food Insecurity (08/19/2022)  Housing: Low Risk  (08/19/2022)  Transportation Needs: No Transportation Needs (08/19/2022)  Utilities: Not At Risk (08/19/2022)  Alcohol Screen: Low Risk  (08/19/2022)  Depression (PHQ2-9): Low Risk  (09/08/2022)  Financial Resource Strain: Low Risk  (08/19/2022)  Physical Activity: Inactive (08/19/2022)  Social Connections: Moderately Integrated (08/19/2022)  Stress: No Stress Concern Present (08/19/2022)  Tobacco Use: Low Risk  (09/27/2022)   SDOH Interventions:     Readmission Risk Interventions     No data to display

## 2022-09-29 NOTE — Progress Notes (Signed)
PROGRESS NOTE                                                                                                                                                                                                             Patient Demographics:    Janice Small, is a 89 y.o. female, DOB - Apr 03, 1928, [id number]  Outpatient Primary MD for the patient is [place], Blondell Reveal, MD    LOS - 3  Admit date - 09/25/2022    Chief Complaint  Patient presents with   Weakness       Brief Narrative (HPI from H&P)    89 y.o. female with medical history significant of atrial fibrillation on xarelto, HTN, hypothyroidism, diastolic CHF, primary hyperparathyroidism who presented to ED with complaints of weakness, apparently was having dark tarry stools for the past 3 to 4 days in the ER she was found to be severely anemic with elevated BUN suggesting upper GI bleed and was admitted to the hospital.   Subjective:   Patient in bed, appears comfortable, denies any headache, no fever, no chest pain or pressure, no shortness of breath , no abdominal pain. No new focal weakness.    Assessment  & Plan :    Acute blood loss related anemia likely due to acute upper GI bleed in the setting of Xarelto use.  She had elevated BUN with history of daily NSAID use and Xarelto use, likely had upper GI bleed, s/p 3 units of packed red blood transfusion on 09/25/2022, another unit on 09/27/2022.  Continue IV PPI.  She underwent on 09/26/2022 EGD noted with hiatal hernia, nonbleeding gastric ulcer with gastritis, a single non-bleeding angioectasia in the fundus, treated with argon plasma coagulation.  Flex sig and colonoscopy with no evidence of ongoing bleeding except some thrombosed hemorrhoids, due for capsule endoscopy on 09/28/2022.  She still seems to have reoccurrence of some bleeding night of 09/26/2022, getting another unit of packed RBC on 09/27/2022 along with  colonoscopy.  Continue PPI and close CBC monitoring - stable CBC.  Acute blood loss anemia - as above   Dehydration, hypernatremia with acute renal failure superimposed on stage 3b chronic kidney disease ([place]) -  Likely pre-renal/intra renal in setting of hypoperfusion of the kidney with acute blood loss anemia, hold NSAID, Farxiga, renal function much improved.  Resolved after  hydration.  Elevated troponin - Troponin flat with no chest pain or changes on EKG, not indicative of ACS. Likely demand ischemia in setting of acute anemia, posttransfusion completely symptom-free and stable.    Chronic diastolic CHF servant EF of 60% on recent echocardiogram- She has worsening dyspnea on exertion and elevated BNP likely CHF exacerbated by anemia, does have edema, TED stockings, diurese as tolerated by blood pressure and renal function.  Lactic acidosis - Likely secondary to hypovolemia from acute GI bleed.   Chr. Atrial fibrillation (HCC) - Rate controlled, Hold xarelto, CHA2DS2-VASc score is 5. Amiodarone started and will take 200mg  BID until 10/07/2022, DCCV pending for April 2024. Conitnue metoprolol and amiodarone for rate control   Essential hypertension - blood pressure mildly elevated, continue beta-blocker, discontinue Norvasc due to 3+ peripheral edema, will substituted with BiDil.  Hypothyroidism - TSH wnl 07/2022, Continue synthroid 13mcg daily   Severe hypokalemia.  Replaced will monitor closely.      Condition - Extremely Guarded  Family Communication  :  None present  Code Status :  Full  Consults  :  GI  PUD Prophylaxis : PPI   Procedures  :     EGD - Impression:               - White nummular lesions in esophageal mucosa.                            Brushings performed.                           - 2 cm hiatal hernia.                           - A single non-bleeding angioectasia in the fundus.                            Treated with argon plasma coagulation (APC).                            - Non-bleeding gastric ulcers with a clean ulcer                            base ([place] Class III).                           - Gastritis. Biopsied.                           - No gross lesions in the duodenal bulb, in the                            first portion of the duodenum and in the second                            portion of the duodenum. Recommendation:           - Proceed to scheduled Flexible Sigmoidoscopy.                           - PO PPI  twice daily.                           - Carafate twice daily for 2-weeks.                           - Consider repeat EGD with for consideration of                            ulcer healing evaluation in 4 months though may                            want to forego that based on patient's age and                            other medical comorbidities.  Flex Sig   Impression:               - Preparation of the colon was inadequate.                           - Hemorrhoids found on digital rectal exam.                           - Blood in the rectum, in the recto-sigmoid colon,                            in the sigmoid colon and in the descending colon.                           - Diverticulosis in the recto-sigmoid colon and in                            the sigmoid colon.                           - Non-bleeding non-thrombosed external and internal                            hemorrhoids. Recommendation:           - The patient will be observed post-procedure,                            until all discharge criteria are met.                           - Return patient to hospital ward for ongoing care.                           - Recommend full colonoscopy to evaluate colon for                            potential source of GI blood loss. With the [facility]  stools noted on today's examination, her                            intermittent reported dark stools, I have concerned                             that there could be a right-sided lesion that is                            the source of issues. If her colonoscopy is clear,                            then we will consider video capsule endoscopy                            swallowing. Plan will be for tomorrow.                           -If patient has significant hemodynamicly unstable                            bleeding, she needs to go for CT angio GI bleed                            scan. Expect that as she cleans out for her                            colonoscopy tonight into tomorrow that she will                            have some old blood per rectum.  Colonoscopy.  Impression:               - Preparation of the colon was only fair after                            extensive/copious lavage.                           - Hemorrhoids found on digital rectal exam.                           - There was significant tortuosity and looping of                            the colon.                           - Stool and old maroon blood noted in the entire                            examined colon. Lavaged.                           - The examined portion  of the distal terminal ileum                            was normal.                           - Three colonic angioectasias. Treated with argon                            plasma coagulation (APC). 1 clip placed on the                            largest AVM.                           - Diverticulosis in the AC/HF/DC/[place]/RS colon.                           -No large masses or lesions were felt to be seen on                            today's examination.                           - Non-bleeding non-thrombosed external and internal                            hemorrhoids.                           -The caveat to this is that the patient's                            preparation was only fair so in theory, this would                            not count as a screening evaluation (which  it was                            not intended for). Recommendation:           - Allow patient to awaken and will move forward                            with her swallowing video capsule endoscopy.                           - Trend hemoglobin/hematocrit.                           - N.p.o. for 2 hours. Clear liquids for 2 hours.                            Small snack for 2 hours then full liquid diet  thereafter.                           - We will pick up the video capsule endoscopy                            tomorrow. Hopefully will be able to read it to plan                            next step in GI bleeding evaluation. CT                            enterography likely to be considered but I would                            allow the video capsule endoscopy to be completed                            first.      Disposition Plan  :    Status is: Observation  DVT Prophylaxis  :    Place and maintain sequential compression device Start: 09/26/22 0548 SCDs Start: 09/25/22 0919    Lab Results  Component Value Date   PLT 150 09/29/2022    Diet :  Diet Order             DIET SOFT Room service appropriate? Yes; Fluid consistency: Thin  Diet effective now                    Inpatient Medications  Scheduled Meds:  amiodarone  200 mg Oral BID   Chlorhexidine Gluconate Cloth  6 each Topical Daily   isosorbide-hydrALAZINE  2 tablet Oral TID   levothyroxine  75 mcg Oral QAC breakfast   metoprolol succinate  25 mg Oral Daily   pantoprazole  40 mg Intravenous Q12H   Continuous Infusions:   PRN Meds:.acetaminophen **OR** acetaminophen, alum & mag hydroxide-simeth, ondansetron **OR** ondansetron (ZOFRAN) IV, simethicone  Antibiotics  :    Anti-infectives (From admission, onward)    None         Objective:   Vitals:   09/28/22 1600 09/28/22 1900 09/28/22 2130 09/29/22 0400  BP: 110/64 (!) 91/55 (!) 105/59 100/60  Pulse: 80 72 76 64   Resp: (!) 25 (!) 26 19 20   Temp: 98.3 F (36.8 C) 98.5 F (36.9 C)    TempSrc: Oral Oral    SpO2: 97% 97% 97% 97%  Weight:      Height:        Wt Readings from Last 3 Encounters:  09/26/22 62.2 kg  09/22/22 62.2 kg  09/08/22 60.9 kg     Intake/Output Summary (Last 24 hours) at 09/29/2022 0810 Last data filed at 09/29/2022 0600 Gross per 24 hour  Intake 1200 ml  Output 600 ml  Net 600 ml     Physical Exam  Awake Alert, No new F.N deficits, Normal affect [place].AT,PERRAL Supple Neck, No JVD,   Symmetrical Chest wall movement, Good air movement bilaterally, CTAB RRR,No Gallops,Rubs or new Murmurs,  +ve B.Sounds, Abd Soft, No tenderness,   No Cyanosis, Clubbing or edema        Data Review:    Recent Labs  Lab  09/25/22 0230 09/25/22 1304 09/27/22 0358 09/27/22 1531 09/28/22 0436 09/28/22 1530 09/29/22 0328  WBC 6.5   < > 7.5 7.2 7.3 7.1 6.6  HGB 3.9*   < > 7.0* 9.0* 8.3* 8.5* 8.6*  HCT 13.4*   < > 21.2* 26.7* 25.0* 26.7* 27.1*  PLT 263   < > 186 176 156 158 150  MCV 99.3   < > 92.2 92.7 94.3 96.0 95.8  MCH 28.9   < > 30.4 31.3 31.3 30.6 30.4  MCHC 29.1*   < > 33.0 33.7 33.2 31.8 31.7  RDW 17.2*   < > 16.2* 15.9* 16.0* 15.9* 15.8*  LYMPHSABS 0.7  --  0.7  --  1.3  --  1.1  MONOABS 0.4  --  0.7  --  0.6  --  0.6  EOSABS 0.0  --  0.0  --  0.2  --  0.2  BASOSABS 0.0  --  0.0  --  0.0  --  0.0   < > = values in this interval not displayed.    Recent Labs  Lab 09/25/22 0230 09/26/22 0822 09/26/22 1026 09/26/22 1552 09/27/22 0358 09/28/22 0436 09/29/22 0328  NA 137 148* 151* 148* 151* 140 135  K 4.0 2.5* 2.7* 3.0* 3.0* 3.6 3.6  CL 106 116* 112* 115* 120* 113* 107  CO2 14* 22  --  21* 23 22 23   ANIONGAP 17* 10  --  12 8 5 5   GLUCOSE 125* 104* 107* 165* 146* 120* 104*  BUN 83* 44* 38* 37* 27* 19 18  CREATININE 1.87* 1.18* 1.20* 1.16* 1.00 0.95 1.15*  AST 45*  --   --   --   --   --   --   ALT 35  --   --   --   --   --   --   ALKPHOS 31*  --   --   --    --   --   --   BILITOT 0.8  --   --   --   --   --   --   ALBUMIN 3.2*  --   --   --   --   --   --   BNP 467.9* 846.3*  --   --  561.6* 388.3* 464.8*  MG  --  2.5*  --   --  2.4 2.1 2.1  CALCIUM 8.9 8.3*  --  8.1* 7.9* 7.5* 7.5*   Radiology Reports DG CHEST PORT 1 VIEW  Result Date: 09/25/2022 CLINICAL DATA:  Shortness of breath, atrial fibrillation EXAM: PORTABLE CHEST 1 VIEW COMPARISON:  Previous studies including the examination done on 01/09/2022 FINDINGS: Transverse diameter of heart is increased. There are no signs of pulmonary edema or focal pulmonary consolidation. There is no significant pleural effusion or pneumothorax. Degenerative changes with small bony spurs are noted in both shoulders. IMPRESSION: There are no signs of pulmonary edema or focal pulmonary consolidation. Electronically Signed   By: Elmer Picker M.D.   On: 09/25/2022 09:40      Signature  -   Lala Lund M.D on 09/29/2022 at 8:10 AM   -  To page go to [email or web address]

## 2022-09-29 NOTE — Plan of Care (Signed)
  Problem: Education: Goal: Knowledge of General Education information will improve Description: Including pain rating scale, medication(s)/side effects and non-pharmacologic comfort measures 09/29/2022 2357 by Lonny Prude, RN Outcome: Progressing 09/29/2022 2357 by Lonny Prude, RN Outcome: Progressing   Problem: Health Behavior/Discharge Planning: Goal: Ability to manage health-related needs will improve 09/29/2022 2357 by Lonny Prude, RN Outcome: Progressing 09/29/2022 2357 by Lonny Prude, RN Outcome: Progressing   Problem: Clinical Measurements: Goal: Ability to maintain clinical measurements within normal limits will improve 09/29/2022 2357 by Lonny Prude, RN Outcome: Progressing 09/29/2022 2357 by Lonny Prude, RN Outcome: Progressing Goal: Will remain free from infection Outcome: Progressing Goal: Diagnostic test results will improve Outcome: Progressing Goal: Respiratory complications will improve Outcome: Progressing Goal: Cardiovascular complication will be avoided Outcome: Progressing   Problem: Activity: Goal: Risk for activity intolerance will decrease Outcome: Progressing   Problem: Nutrition: Goal: Adequate nutrition will be maintained Outcome: Progressing   Problem: Coping: Goal: Level of anxiety will decrease Outcome: Progressing   Problem: Elimination: Goal: Will not experience complications related to bowel motility Outcome: Progressing Goal: Will not experience complications related to urinary retention Outcome: Progressing   Problem: Pain Managment: Goal: General experience of comfort will improve Outcome: Progressing   Problem: Safety: Goal: Ability to remain free from injury will improve Outcome: Progressing   Problem: Skin Integrity: Goal: Risk for impaired skin integrity will decrease Outcome: Progressing

## 2022-09-29 NOTE — Progress Notes (Signed)
Small bowel video capsule unremarkable, no additional findings other than findings already noted during EGD and colonoscopy  Advance diet as tolerated Continue PPI twice daily  Restart Eliquis in 5 days  Will arrange for repeat hemoglobin next week as outpatient Will arrange for outpatient GI follow-up with Dr. Carlean Purl  GI will sign off, available if have any questions  K. Denzil Magnuson , MD [phone]

## 2022-09-29 NOTE — Care Management Important Message (Signed)
Important Message  Patient Details  Name: Janice Small MRN: [id number] Date of Birth: 07/25/1927   Medicare Important Message Given:  Yes     Yassir Enis [address] 09/29/2022, 2:44 PM

## 2022-09-29 NOTE — Progress Notes (Signed)
Physical Therapy Treatment Patient Details Name: Janice Small MRN: [id number] DOB: Feb 16, 1928 Today's Date: 09/29/2022   History of Present Illness 89 y/o F admitted to [facility] on 3/14 for weakness, along with lightheadedness, episodes of black stool, and dyspnea on exertion. Hgb found to be 3.9, improved after 3 units of PRBCs. S/p EGD and flexible sigmoidoscopy 3/15. PMHx: chronic a-fib on Xarelto, HTN, hypothyroidism, diastolic CHF, primary hyperparathyroidism, prolapsing bleeding hemorrhoids s/p banding.    PT Comments    Patient progressing slowly towards PT goals. Session focused on progressive ambulation and standing session of ADLs at sink. Requires min A for bed mobility and transfers today due to pain in RLE and weakness. Pt self limiting distance due to fatigue and weakness. Improved ambulation distance slightly with encouragement. Discharge recommendation updated to SNF as pt lives alone and will not be able to care for self at this level without assist. Needs to be able to negotiate 1 flight of stairs. Will inform SW. Will follow.    Recommendations for follow up therapy are one component of a multi-disciplinary discharge planning process, led by the attending physician.  Recommendations may be updated based on patient status, additional functional criteria and insurance authorization.  Follow Up Recommendations  Skilled nursing-short term rehab (<3 hours/day) Can patient physically be transported by private vehicle: Yes   Assistance Recommended at Discharge Intermittent Supervision/Assistance  Patient can return home with the following A little help with walking and/or transfers;Assist for transportation;Help with stairs or ramp for entrance;Assistance with cooking/housework   Equipment Recommendations  None recommended by PT    Recommendations for Other Services       Precautions / Restrictions Precautions Precautions: Fall Restrictions Weight Bearing Restrictions: No      Mobility  Bed Mobility Overal bed mobility: Needs Assistance Bed Mobility: Supine to Sit     Supine to sit: Min assist, HOB elevated     General bed mobility comments: Min A for bringing RLE toward EOB    Transfers Overall transfer level: Needs assistance Equipment used: Rolling walker (2 wheels) Transfers: Sit to/from Stand Sit to Stand: Min assist           General transfer comment: Min A to power to standing with cues for hand placement/technique, transferred to chair post ambulation.    Ambulation/Gait Ambulation/Gait assistance: Min guard Gait Distance (Feet): 40 Feet Assistive device: Rolling walker (2 wheels) Gait Pattern/deviations: Step-through pattern, Decreased stride length, Trunk flexed, Shuffle Gait velocity: decreased Gait velocity interpretation: <1.31 ft/sec, indicative of household ambulator   General Gait Details: Slow, mildly unsteady gait with shuffling like pattern, stopping at sink and performing ADLs leaning on counter. Self limiting due to fatigue/weakness.   Stairs             Wheelchair Mobility    Modified Rankin (Stroke Patients Only)       Balance Overall balance assessment: Needs assistance Sitting-balance support: No upper extremity supported, Feet supported Sitting balance-Leahy Scale: Fair     Standing balance support: During functional activity, Single extremity supported Standing balance-Leahy Scale: Poor Standing balance comment: Requires at least 1 UE support when standing at sink to perform ADls.                            Cognition Arousal/Alertness: Awake/alert Behavior During Therapy: WFL for tasks assessed/performed Overall Cognitive Status: Impaired/Different from baseline Area of Impairment: Orientation, Problem solving, Following commands, Safety/judgement  Orientation Level: Disoriented to, Time     Following Commands: Follows one step commands with increased  time Safety/Judgement: Decreased awareness of safety   Problem Solving: Requires verbal cues General Comments: Good awareness of not being able to return home and care for self in this state but not able to update PT on medical updates regarding procedure etc. "I don't know honey."        Exercises      General Comments General comments (skin integrity, edema, etc.): VSS on RA      Pertinent Vitals/Pain Pain Assessment Pain Assessment: Faces Faces Pain Scale: Hurts little more Pain Location: right groin Pain Descriptors / Indicators: Sharp Pain Intervention(s): Monitored during session, Limited activity within patient's tolerance    Home Living                          Prior Function            PT Goals (current goals can now be found in the care plan section) Progress towards PT goals: Progressing toward goals (slowly)    Frequency    Min 3X/week      PT Plan Discharge plan needs to be updated    Co-evaluation              AM-PAC PT "6 Clicks" Mobility   Outcome Measure  Help needed turning from your back to your side while in a flat bed without using bedrails?: A Little Help needed moving from lying on your back to sitting on the side of a flat bed without using bedrails?: A Little Help needed moving to and from a bed to a chair (including a wheelchair)?: A Little Help needed standing up from a chair using your arms (e.g., wheelchair or bedside chair)?: A Little Help needed to walk in hospital room?: A Little Help needed climbing 3-5 steps with a railing? : A Lot 6 Click Score: 17    End of Session Equipment Utilized During Treatment: Gait belt Activity Tolerance: Patient limited by fatigue Patient left: in chair;with call bell/phone within reach;with chair alarm set Nurse Communication: Mobility status PT Visit Diagnosis: Other abnormalities of gait and mobility (R26.89);Muscle weakness (generalized) (M62.81)     Time: [id number] PT  Time Calculation (min) (ACUTE ONLY): 26 min  Charges:  $Gait Training: 8-22 mins $Therapeutic Activity: 8-22 mins                     Marisa Severin, PT, DPT Acute Rehabilitation Services Secure chat preferred Office [place] 09/29/2022, 12:10 PM

## 2022-09-30 DIAGNOSIS — K922 Gastrointestinal hemorrhage, unspecified: Secondary | ICD-10-CM | POA: Diagnosis not present

## 2022-09-30 LAB — CBC WITH DIFFERENTIAL/PLATELET
Abs Immature Granulocytes: 0.04 10*3/uL (ref 0.00–0.07)
Basophils Absolute: 0 10*3/uL (ref 0.0–0.1)
Basophils Relative: 0 %
Eosinophils Absolute: 0.2 10*3/uL (ref 0.0–0.5)
Eosinophils Relative: 3 %
HCT: 25.7 % — ABNORMAL LOW (ref 36.0–46.0)
Hemoglobin: 8.3 g/dL — ABNORMAL LOW (ref 12.0–15.0)
Immature Granulocytes: 1 %
Lymphocytes Relative: 15 %
Lymphs Abs: 1.1 10*3/uL (ref 0.7–4.0)
MCH: 31.1 pg (ref 26.0–34.0)
MCHC: 32.3 g/dL (ref 30.0–36.0)
MCV: 96.3 fL (ref 80.0–100.0)
Monocytes Absolute: 0.7 10*3/uL (ref 0.1–1.0)
Monocytes Relative: 10 %
Neutro Abs: 5.3 10*3/uL (ref 1.7–7.7)
Neutrophils Relative %: 71 %
Platelets: 141 10*3/uL — ABNORMAL LOW (ref 150–400)
RBC: 2.67 MIL/uL — ABNORMAL LOW (ref 3.87–5.11)
RDW: 16.9 % — ABNORMAL HIGH (ref 11.5–15.5)
WBC: 7.4 10*3/uL (ref 4.0–10.5)
nRBC: 0.3 % — ABNORMAL HIGH (ref 0.0–0.2)

## 2022-09-30 LAB — BASIC METABOLIC PANEL
Anion gap: 6 (ref 5–15)
BUN: 28 mg/dL — ABNORMAL HIGH (ref 8–23)
CO2: 20 mmol/L — ABNORMAL LOW (ref 22–32)
Calcium: 7.6 mg/dL — ABNORMAL LOW (ref 8.9–10.3)
Chloride: 109 mmol/L (ref 98–111)
Creatinine, Ser: 1.37 mg/dL — ABNORMAL HIGH (ref 0.44–1.00)
GFR, Estimated: 36 mL/min — ABNORMAL LOW (ref >60)
Glucose, Bld: 108 mg/dL — ABNORMAL HIGH (ref 70–99)
Potassium: 3.9 mmol/L (ref 3.5–5.1)
Sodium: 135 mmol/L (ref 135–145)

## 2022-09-30 LAB — MAGNESIUM: Magnesium: 2.3 mg/dL (ref 1.7–2.4)

## 2022-09-30 LAB — CYTOLOGY - NON PAP

## 2022-09-30 LAB — BRAIN NATRIURETIC PEPTIDE: B Natriuretic Peptide: 499 pg/mL — ABNORMAL HIGH (ref 0.0–100.0)

## 2022-09-30 MED ORDER — LACTATED RINGERS IV SOLN: INTRAVENOUS | Status: AC

## 2022-09-30 MED ORDER — PANTOPRAZOLE SODIUM 40 MG PO TBEC
40.0000 mg | DELAYED_RELEASE_TABLET | Freq: Two times a day (BID) | ORAL | Status: DC
  Administered 2022-09-30 – 2022-10-01 (×3): 40 mg via ORAL
  Filled 2022-09-30 (×3): qty 1

## 2022-09-30 MED ORDER — METOPROLOL SUCCINATE ER 25 MG PO TB24
25.0000 mg | ORAL_TABLET | Freq: Every day | ORAL | Status: DC
  Administered 2022-10-01: 25 mg via ORAL
  Filled 2022-09-30 (×2): qty 1

## 2022-09-30 NOTE — Progress Notes (Signed)
Occupational Therapy Treatment Patient Details Name: Janice Small MRN: [id number] DOB: Dec 13, 1927 Today's Date: 09/30/2022   History of present illness 89 y/o F admitted to [facility] on 3/14 for weakness, along with lightheadedness, episodes of black stool, and dyspnea on exertion. Hgb found to be 3.9, improved after 3 units of PRBCs. S/p EGD and flexible sigmoidoscopy 3/15. PMHx: chronic a-fib on Xarelto, HTN, hypothyroidism, diastolic CHF, primary hyperparathyroidism, prolapsing bleeding hemorrhoids s/p banding.   OT comments  Pt is making fair progress towards their acute OT goals. Overall she required min A to get to the EOB and close min G to stand with RW. Pt tolerated standing at the sink for 15 minutes for ADLs with generalized superivsion A for safety. OT to continue to follow acutely. Discharge recommendation for post-acute rehab remains appropriate.    Recommendations for follow up therapy are one component of a multi-disciplinary discharge planning process, led by the attending physician.  Recommendations may be updated based on patient status, additional functional criteria and insurance authorization.    Follow Up Recommendations  Skilled nursing-short term rehab (<3 hours/day)     Assistance Recommended at Discharge Intermittent Supervision/Assistance  Patient can return home with the following  A little help with walking and/or transfers;A little help with bathing/dressing/bathroom;Assist for transportation;Help with stairs or ramp for entrance;Direct supervision/assist for financial management;Direct supervision/assist for medications management;Assistance with cooking/housework   Equipment Recommendations  BSC/3in1    Recommendations for Other Services      Precautions / Restrictions Precautions Precautions: Fall Restrictions Weight Bearing Restrictions: No       Mobility Bed Mobility Overal bed mobility: Needs Assistance Bed Mobility: Supine to Sit     Supine to  sit: Min assist     General bed mobility comments: min A for RLE    Transfers Overall transfer level: Needs assistance Equipment used: Rolling walker (2 wheels) Transfers: Sit to/from Stand Sit to Stand: Min assist                 Balance Overall balance assessment: Needs assistance Sitting-balance support: Feet supported Sitting balance-Leahy Scale: Fair     Standing balance support: No upper extremity supported, During functional activity Standing balance-Leahy Scale: Fair Standing balance comment: stood at the sink without BUE support                           ADL either performed or assessed with clinical judgement   ADL Overall ADL's : Needs assistance/impaired     Grooming: Supervision/safety;Wash/dry hands;Wash/dry face;Oral care;Brushing hair;Standing Grooming Details (indicate cue type and reason): at the sink Upper Body Bathing: Set up;Standing Upper Body Bathing Details (indicate cue type and reason): at sink             Toilet Transfer: Min guard;Ambulation;Rolling walker (2 wheels) Toilet Transfer Details (indicate cue type and reason): simulated         Functional mobility during ADLs: Min guard;Rolling walker (2 wheels) General ADL Comments: tolerated standing at the sink ~15 minutes for ADls    Extremity/Trunk Assessment Upper Extremity Assessment Upper Extremity Assessment: Generalized weakness   Lower Extremity Assessment Lower Extremity Assessment: Generalized weakness        Vision   Vision Assessment?: No apparent visual deficits Additional Comments: wears glasses   Perception Perception Perception: Not tested   Praxis Praxis Praxis: Not tested    Cognition Arousal/Alertness: Awake/alert Behavior During Therapy: WFL for tasks assessed/performed Overall Cognitive Status: No family/caregiver present to  determine baseline cognitive functioning                                 General Comments:  Overall WFL for basic tasks assessed. Needs encouragement to participate when fatigued              General Comments VSS on RA    Pertinent Vitals/ Pain       Pain Assessment Pain Assessment: Faces Faces Pain Scale: Hurts a little bit Pain Location: RLE Pain Descriptors / Indicators: Discomfort Pain Intervention(s): Limited activity within patient's tolerance, Monitored during session   Frequency  Min 2X/week        Progress Toward Goals  OT Goals(current goals can now be found in the care plan section)  Progress towards OT goals: Progressing toward goals  Acute Rehab OT Goals Patient Stated Goal: to get stronger OT Goal Formulation: With patient Time For Goal Achievement: 10/12/22 Potential to Achieve Goals: Good ADL Goals Pt Will Perform Lower Body Dressing: with modified independence;sit to/from stand Pt Will Transfer to Toilet: with modified independence;ambulating;regular height toilet Pt Will Perform Tub/Shower Transfer: Tub transfer;with supervision;ambulating  Plan Discharge plan remains appropriate       AM-PAC OT "6 Clicks" Daily Activity     Outcome Measure   Help from another person eating meals?: None Help from another person taking care of personal grooming?: A Little Help from another person toileting, which includes using toliet, bedpan, or urinal?: A Little Help from another person bathing (including washing, rinsing, drying)?: A Little Help from another person to put on and taking off regular upper body clothing?: A Little Help from another person to put on and taking off regular lower body clothing?: A Little 6 Click Score: 19    End of Session Equipment Utilized During Treatment: Gait belt;Rolling walker (2 wheels)  OT Visit Diagnosis: Unsteadiness on feet (R26.81);Muscle weakness (generalized) (M62.81);Pain   Activity Tolerance Patient tolerated treatment well   Patient Left in bed;with call bell/phone within reach   Nurse  Communication Mobility status        Time: [id number] OT Time Calculation (min): 22 min  Charges: OT General Charges $OT Visit: 1 Visit OT Treatments $Self Care/Home Management : 8-22 mins  Shade Flood, OTR/L [place] Office [phone] Secure Chat Communication Preferred   Elliot Cousin 09/30/2022, 10:28 AM

## 2022-09-30 NOTE — Progress Notes (Signed)
PROGRESS NOTE                                                                                                                                                                                                             Patient Demographics:    Janice Small, is a 89 y.o. female, DOB - Nov 19, 1927, [id number]  Outpatient Primary MD for the patient is [place], Blondell Reveal, MD    LOS - 4  Admit date - 09/25/2022    Chief Complaint  Patient presents with   Weakness       Brief Narrative (HPI from H&P)    89 y.o. female with medical history significant of atrial fibrillation on xarelto, HTN, hypothyroidism, diastolic CHF, primary hyperparathyroidism who presented to ED with complaints of weakness, apparently was having dark tarry stools for the past 3 to 4 days in the ER she was found to be severely anemic with elevated BUN suggesting upper GI bleed and was admitted to the hospital.   Subjective:   Patient in bed, appears comfortable, denies any headache, no fever, no chest pain or pressure, no shortness of breath , no abdominal pain. No new focal weakness.    Assessment  & Plan :    Acute blood loss related anemia likely due to acute upper GI bleed in the setting of Xarelto use.  She had elevated BUN with history of daily NSAID use and Xarelto use, likely had upper GI bleed, s/p 3 units of packed red blood transfusion on 09/25/2022, another unit on 09/27/2022.  Continue IV PPI.  She underwent on 09/26/2022 EGD noted with hiatal hernia, nonbleeding gastric ulcer with gastritis, a single non-bleeding angioectasia in the fundus, treated with argon plasma coagulation.  Flex sig and colonoscopy with no evidence of ongoing bleeding except some thrombosed hemorrhoids, underwent capsule endoscopy on 09/28/2022 which looks unremarkable.    Likely bleeding was due to underlying gastric ulcer with gastritis and nonbleeding  angioectasia in the  fundus the setting of NSAID and Xarelto use.  Currently bleeding seems to have subsided, continue to hold Xarelto for a total of 5 days, patient and family counseled not to use NSAIDs.  If family agreeable resume Xarelto after a 5-day break with close monitoring of CBC.   Acute blood loss anemia - as above   Dehydration, hypernatremia with acute renal  failure superimposed on stage 3b chronic kidney disease ([place]) -  Likely pre-renal/intra renal in setting of hypoperfusion of the kidney with acute blood loss anemia, hold NSAID, Farxiga, renal function much improved.  Skip blood pressure medications on 09/30/2022, gentle hydration and monitor.  Baseline creatinine around 1.2.  Elevated troponin - Troponin flat with no chest pain or changes on EKG, not indicative of ACS. Likely demand ischemia in setting of acute anemia, posttransfusion completely symptom-free and stable.    Chronic diastolic CHF servant EF of 60% on recent echocardiogram- She has worsening dyspnea on exertion and elevated BNP likely CHF exacerbated by anemia, does have edema, TED stockings, diurese as tolerated by blood pressure and renal function.  Lactic acidosis - Likely secondary to hypovolemia from acute GI bleed.   Chr. Atrial fibrillation (HCC) - Rate controlled, Hold xarelto, CHA2DS2-VASc score is 5. Amiodarone started and will take 200mg  BID until 10/07/2022, DCCV pending for April 2024. Conitnue metoprolol and amiodarone for rate control   Essential hypertension - blood pressure soft, discontinue BiDil, Toprol skip today, gentle IV fluids and monitor.  Hypothyroidism - TSH wnl 07/2022, Continue synthroid 63mcg daily   Severe hypokalemia.  Replaced will monitor closely.      Condition - Extremely Guarded  Family Communication  : Son updated in detail on 09/29/2022, 09/30/2022  Code Status :  Full  Consults  :  GI  PUD Prophylaxis : PPI   Procedures  :     Capsule endoscopy.  Unremarkable.    EGD - Impression:                - White nummular lesions in esophageal mucosa.                            Brushings performed.                           - 2 cm hiatal hernia.                           - A single non-bleeding angioectasia in the fundus.                            Treated with argon plasma coagulation (APC).                           - Non-bleeding gastric ulcers with a clean ulcer                            base ([place] Class III).                           - Gastritis. Biopsied.                           - No gross lesions in the duodenal bulb, in the                            first portion of the duodenum and in the second  portion of the duodenum. Recommendation:           - Proceed to scheduled Flexible Sigmoidoscopy.                           - PO PPI twice daily.                           - Carafate twice daily for 2-weeks.                           - Consider repeat EGD with for consideration of                            ulcer healing evaluation in 4 months though may                            want to forego that based on patient's age and                            other medical comorbidities.  Flex Sig   Impression:               - Preparation of the colon was inadequate.                           - Hemorrhoids found on digital rectal exam.                           - Blood in the rectum, in the recto-sigmoid colon,                            in the sigmoid colon and in the descending colon.                           - Diverticulosis in the recto-sigmoid colon and in                            the sigmoid colon.                           - Non-bleeding non-thrombosed external and internal                            hemorrhoids. Recommendation:           - The patient will be observed post-procedure,                            until all discharge criteria are met.                           - Return patient to hospital ward for ongoing care.                            - Recommend full colonoscopy to evaluate colon for  potential source of GI blood loss. With the maroon                            stools noted on today's examination, her                            intermittent reported dark stools, I have concerned                            that there could be a right-sided lesion that is                            the source of issues. If her colonoscopy is clear,                            then we will consider video capsule endoscopy                            swallowing. Plan will be for tomorrow.                           -If patient has significant hemodynamicly unstable                            bleeding, she needs to go for CT angio GI bleed                            scan. Expect that as she cleans out for her                            colonoscopy tonight into tomorrow that she will                            have some old blood per rectum.  Colonoscopy.  Impression:               - Preparation of the colon was only fair after                            extensive/copious lavage.                           - Hemorrhoids found on digital rectal exam.                           - There was significant tortuosity and looping of                            the colon.                           - Stool and old maroon blood noted in the entire  examined colon. Lavaged.                           - The examined portion of the distal terminal ileum                            was normal.                           - Three colonic angioectasias. Treated with argon                            plasma coagulation (APC). 1 clip placed on the                            largest AVM.                           - Diverticulosis in the AC/HF/DC/[place]/RS colon.                           -No large masses or lesions were felt to be seen on                            today's examination.                            - Non-bleeding non-thrombosed external and internal                            hemorrhoids.                           -The caveat to this is that the patient's                            preparation was only fair so in theory, this would                            not count as a screening evaluation (which it was                            not intended for). Recommendation:           - Allow patient to awaken and will move forward                            with her swallowing video capsule endoscopy.                           - Trend hemoglobin/hematocrit.                           - N.p.o. for 2 hours. Clear liquids for 2 hours.  Small snack for 2 hours then full liquid diet                            thereafter.                           - We will pick up the video capsule endoscopy                            tomorrow. Hopefully will be able to read it to plan                            next step in GI bleeding evaluation. CT                            enterography likely to be considered but I would                            allow the video capsule endoscopy to be completed                            first.      Disposition Plan  :    Status is: Observation  DVT Prophylaxis  :    Place and maintain sequential compression device Start: 09/26/22 0548 SCDs Start: 09/25/22 0919    Lab Results  Component Value Date   PLT 141 (L) 09/30/2022    Diet :  Diet Order             DIET SOFT Room service appropriate? Yes; Fluid consistency: Thin  Diet effective now                    Inpatient Medications  Scheduled Meds:  amiodarone  200 mg Oral BID   Chlorhexidine Gluconate Cloth  6 each Topical Daily   isosorbide-hydrALAZINE  2 tablet Oral TID   levothyroxine  75 mcg Oral QAC breakfast   metoprolol succinate  25 mg Oral Daily   neomycin-bacitracin-polymyxin   Topical TID   pantoprazole  40 mg Intravenous Q12H   Continuous  Infusions:   PRN Meds:.acetaminophen **OR** acetaminophen, alum & mag hydroxide-simeth, ondansetron **OR** ondansetron (ZOFRAN) IV, simethicone  Antibiotics  :    Anti-infectives (From admission, onward)    None         Objective:   Vitals:   09/29/22 0800 09/29/22 1626 09/29/22 2000 09/30/22 0737  BP: 100/66 102/62 105/85 (!) 103/54  Pulse: 77 (!) 55 69 60  Resp: 18 20 20 16   Temp:  98.7 F (37.1 C) 98.4 F (36.9 C) 98.2 F (36.8 C)  TempSrc:  Oral Oral Oral  SpO2: 97%  96% 100%  Weight:      Height:        Wt Readings from Last 3 Encounters:  09/26/22 62.2 kg  09/22/22 62.2 kg  09/08/22 60.9 kg     Intake/Output Summary (Last 24 hours) at 09/30/2022 0840 Last data filed at 09/30/2022 0600 Gross per 24 hour  Intake --  Output 600 ml  Net -600 ml     Physical Exam  Awake Alert, No new F.N deficits, Normal affect [place].AT,PERRAL Supple Neck, No JVD,  Symmetrical Chest wall movement, Good air movement bilaterally, CTAB RRR,No Gallops, Rubs or new Murmurs,  +ve B.Sounds, Abd Soft, No tenderness,   No Cyanosis, Clubbing or edema        Data Review:    Recent Labs  Lab 09/25/22 0230 09/25/22 1304 09/27/22 0358 09/27/22 1531 09/28/22 0436 09/28/22 1530 09/29/22 0328 09/30/22 0325  WBC 6.5   < > 7.5 7.2 7.3 7.1 6.6 7.4  HGB 3.9*   < > 7.0* 9.0* 8.3* 8.5* 8.6* 8.3*  HCT 13.4*   < > 21.2* 26.7* 25.0* 26.7* 27.1* 25.7*  PLT 263   < > 186 176 156 158 150 141*  MCV 99.3   < > 92.2 92.7 94.3 96.0 95.8 96.3  MCH 28.9   < > 30.4 31.3 31.3 30.6 30.4 31.1  MCHC 29.1*   < > 33.0 33.7 33.2 31.8 31.7 32.3  RDW 17.2*   < > 16.2* 15.9* 16.0* 15.9* 15.8* 16.9*  LYMPHSABS 0.7  --  0.7  --  1.3  --  1.1 1.1  MONOABS 0.4  --  0.7  --  0.6  --  0.6 0.7  EOSABS 0.0  --  0.0  --  0.2  --  0.2 0.2  BASOSABS 0.0  --  0.0  --  0.0  --  0.0 0.0   < > = values in this interval not displayed.    Recent Labs  Lab 09/25/22 0230 09/26/22 0822 09/26/22 1026  09/26/22 1552 09/27/22 0358 09/28/22 0436 09/29/22 0328 09/30/22 0325  NA 137 148*   < > 148* 151* 140 135 135  K 4.0 2.5*   < > 3.0* 3.0* 3.6 3.6 3.9  CL 106 116*   < > 115* 120* 113* 107 109  CO2 14* 22  --  21* 23 22 23  20*  ANIONGAP 17* 10  --  12 8 5 5 6   GLUCOSE 125* 104*   < > 165* 146* 120* 104* 108*  BUN 83* 44*   < > 37* 27* 19 18 28*  CREATININE 1.87* 1.18*   < > 1.16* 1.00 0.95 1.15* 1.37*  AST 45*  --   --   --   --   --   --   --   ALT 35  --   --   --   --   --   --   --   ALKPHOS 31*  --   --   --   --   --   --   --   BILITOT 0.8  --   --   --   --   --   --   --   ALBUMIN 3.2*  --   --   --   --   --   --   --   BNP 467.9* 846.3*  --   --  561.6* 388.3* 464.8* 499.0*  MG  --  2.5*  --   --  2.4 2.1 2.1 2.3  CALCIUM 8.9 8.3*  --  8.1* 7.9* 7.5* 7.5* 7.6*   < > = values in this interval not displayed.   Radiology Reports No results found.    Signature  -   Lala Lund M.D on 09/30/2022 at 8:40 AM   -  To page go to [email or web address]

## 2022-09-30 NOTE — TOC Progression Note (Addendum)
Transition of Care [facility]) - Progression Note    Patient Details  Name: Janice Small MRN: [id number] Date of Birth: 12-24-1927  Transition of Care [facility]) CM/SW [place], LCSW Phone Number: 09/30/2022, 10:28 AM  Clinical Narrative:    10:28am-[facility] able to accept patient. CSW updated insurance; auth pending.   3pm-Insurance approval received, Ref# [id number], Auth ID# [id number], effective 09/30/2022-10/02/2022.   CSW updated patient's son.   Expected Discharge Plan: [place] Barriers to Discharge: Continued Medical Work up, Ship broker  Expected Discharge Plan and Services In-house Referral: Clinical Social Work   Post Acute Care Choice: [place] Living arrangements for the past 2 months: [place]                                       Social Determinants of Health (SDOH) Interventions SDOH Screenings   Food Insecurity: No Food Insecurity (08/19/2022)  Housing: Low Risk  (08/19/2022)  Transportation Needs: No Transportation Needs (08/19/2022)  Utilities: Not At Risk (08/19/2022)  Alcohol Screen: Low Risk  (08/19/2022)  Depression (PHQ2-9): Low Risk  (09/08/2022)  Financial Resource Strain: Low Risk  (08/19/2022)  Physical Activity: Inactive (08/19/2022)  Social Connections: Moderately Integrated (08/19/2022)  Stress: No Stress Concern Present (08/19/2022)  Tobacco Use: Low Risk  (09/27/2022)    Readmission Risk Interventions     No data to display

## 2022-09-30 NOTE — Plan of Care (Signed)

## 2022-10-01 DIAGNOSIS — K5520 Angiodysplasia of colon without hemorrhage: Secondary | ICD-10-CM | POA: Diagnosis not present

## 2022-10-01 DIAGNOSIS — I4819 Other persistent atrial fibrillation: Secondary | ICD-10-CM | POA: Diagnosis not present

## 2022-10-01 DIAGNOSIS — K922 Gastrointestinal hemorrhage, unspecified: Secondary | ICD-10-CM | POA: Diagnosis not present

## 2022-10-01 DIAGNOSIS — K921 Melena: Secondary | ICD-10-CM | POA: Diagnosis not present

## 2022-10-01 DIAGNOSIS — D62 Acute posthemorrhagic anemia: Secondary | ICD-10-CM

## 2022-10-01 LAB — CBC WITH DIFFERENTIAL/PLATELET
Abs Immature Granulocytes: 0.05 10*3/uL (ref 0.00–0.07)
Basophils Absolute: 0 10*3/uL (ref 0.0–0.1)
Basophils Relative: 1 %
Eosinophils Absolute: 0.2 10*3/uL (ref 0.0–0.5)
Eosinophils Relative: 3 %
HCT: 27.2 % — ABNORMAL LOW (ref 36.0–46.0)
Hemoglobin: 8.5 g/dL — ABNORMAL LOW (ref 12.0–15.0)
Immature Granulocytes: 1 %
Lymphocytes Relative: 16 %
Lymphs Abs: 1 10*3/uL (ref 0.7–4.0)
MCH: 30.8 pg (ref 26.0–34.0)
MCHC: 31.3 g/dL (ref 30.0–36.0)
MCV: 98.6 fL (ref 80.0–100.0)
Monocytes Absolute: 0.6 10*3/uL (ref 0.1–1.0)
Monocytes Relative: 10 %
Neutro Abs: 4.5 10*3/uL (ref 1.7–7.7)
Neutrophils Relative %: 69 %
Platelets: 117 10*3/uL — ABNORMAL LOW (ref 150–400)
RBC: 2.76 MIL/uL — ABNORMAL LOW (ref 3.87–5.11)
RDW: 17.5 % — ABNORMAL HIGH (ref 11.5–15.5)
WBC: 6.4 10*3/uL (ref 4.0–10.5)
nRBC: 0.3 % — ABNORMAL HIGH (ref 0.0–0.2)

## 2022-10-01 LAB — BASIC METABOLIC PANEL
Anion gap: 6 (ref 5–15)
BUN: 30 mg/dL — ABNORMAL HIGH (ref 8–23)
CO2: 21 mmol/L — ABNORMAL LOW (ref 22–32)
Calcium: 7.8 mg/dL — ABNORMAL LOW (ref 8.9–10.3)
Chloride: 109 mmol/L (ref 98–111)
Creatinine, Ser: 1.26 mg/dL — ABNORMAL HIGH (ref 0.44–1.00)
GFR, Estimated: 40 mL/min — ABNORMAL LOW (ref >60)
Glucose, Bld: 105 mg/dL — ABNORMAL HIGH (ref 70–99)
Potassium: 4 mmol/L (ref 3.5–5.1)
Sodium: 136 mmol/L (ref 135–145)

## 2022-10-01 MED ORDER — PANTOPRAZOLE SODIUM 40 MG PO TBEC: 40.0000 mg | DELAYED_RELEASE_TABLET | Freq: Two times a day (BID) | ORAL | Status: DC

## 2022-10-01 MED ORDER — TAMSULOSIN HCL 0.4 MG PO CAPS: 0.4000 mg | ORAL_CAPSULE | Freq: Every day | ORAL | Status: DC

## 2022-10-01 MED ORDER — FUROSEMIDE 40 MG PO TABS: 20.0000 mg | ORAL_TABLET | Freq: Every day | ORAL | 3 refills | Status: DC

## 2022-10-01 MED ORDER — SUCRALFATE 1 G PO TABS: 1.0000 g | ORAL_TABLET | Freq: Two times a day (BID) | ORAL | 1 refills | Status: AC

## 2022-10-01 MED ORDER — AMIODARONE HCL 200 MG PO TABS: ORAL_TABLET | ORAL | 0 refills | Status: DC

## 2022-10-01 NOTE — Plan of Care (Signed)
  Problem: Education: Goal: Knowledge of General Education information will improve Description: Including pain rating scale, medication(s)/side effects and non-pharmacologic comfort measures 10/01/2022 1243 by Daleen Bo, RN Outcome: Adequate for Discharge 10/01/2022 1243 by Daleen Bo, RN Outcome: Adequate for Discharge   Problem: Health Behavior/Discharge Planning: Goal: Ability to manage health-related needs will improve 10/01/2022 1243 by Daleen Bo, RN Outcome: Adequate for Discharge 10/01/2022 1243 by Daleen Bo, RN Outcome: Adequate for Discharge   Problem: Clinical Measurements: Goal: Ability to maintain clinical measurements within normal limits will improve 10/01/2022 1243 by Daleen Bo, RN Outcome: Adequate for Discharge 10/01/2022 1243 by Daleen Bo, RN Outcome: Adequate for Discharge Goal: Will remain free from infection 10/01/2022 1243 by Daleen Bo, RN Outcome: Adequate for Discharge 10/01/2022 1243 by Daleen Bo, RN Outcome: Adequate for Discharge Goal: Diagnostic test results will improve 10/01/2022 1243 by Daleen Bo, RN Outcome: Adequate for Discharge 10/01/2022 1243 by Daleen Bo, RN Outcome: Adequate for Discharge Goal: Respiratory complications will improve 10/01/2022 1243 by Daleen Bo, RN Outcome: Adequate for Discharge 10/01/2022 1243 by Daleen Bo, RN Outcome: Adequate for Discharge Goal: Cardiovascular complication will be avoided 10/01/2022 1243 by Daleen Bo, RN Outcome: Adequate for Discharge 10/01/2022 1243 by Daleen Bo, RN Outcome: Adequate for Discharge   Problem: Activity: Goal: Risk for activity intolerance will decrease 10/01/2022 1243 by Daleen Bo, RN Outcome: Adequate for Discharge 10/01/2022 1243 by Daleen Bo, RN Outcome: Adequate for Discharge   Problem: Nutrition: Goal: Adequate nutrition will be maintained 10/01/2022 1243 by Daleen Bo, RN Outcome:  Adequate for Discharge 10/01/2022 1243 by Daleen Bo, RN Outcome: Adequate for Discharge   Problem: Coping: Goal: Level of anxiety will decrease 10/01/2022 1243 by Daleen Bo, RN Outcome: Adequate for Discharge 10/01/2022 1243 by Daleen Bo, RN Outcome: Adequate for Discharge   Problem: Elimination: Goal: Will not experience complications related to bowel motility 10/01/2022 1243 by Daleen Bo, RN Outcome: Adequate for Discharge 10/01/2022 1243 by Daleen Bo, RN Outcome: Adequate for Discharge Goal: Will not experience complications related to urinary retention 10/01/2022 1243 by Daleen Bo, RN Outcome: Adequate for Discharge 10/01/2022 1243 by Daleen Bo, RN Outcome: Adequate for Discharge   Problem: Pain Managment: Goal: General experience of comfort will improve 10/01/2022 1243 by Daleen Bo, RN Outcome: Adequate for Discharge 10/01/2022 1243 by Daleen Bo, RN Outcome: Adequate for Discharge   Problem: Safety: Goal: Ability to remain free from injury will improve 10/01/2022 1243 by Daleen Bo, RN Outcome: Adequate for Discharge 10/01/2022 1243 by Daleen Bo, RN Outcome: Adequate for Discharge   Problem: Skin Integrity: Goal: Risk for impaired skin integrity will decrease 10/01/2022 1243 by Daleen Bo, RN Outcome: Adequate for Discharge 10/01/2022 1243 by Daleen Bo, RN Outcome: Adequate for Discharge

## 2022-10-01 NOTE — TOC Transition Note (Signed)
Transition of Care [facility]) - CM/SW Discharge Note   Patient Details  Name: Janice Small MRN: [id number] Date of Birth: 1928-06-28  Transition of Care [facility]) CM/SW Contact:  Benard Halsted, LCSW Phone Number: 10/01/2022, 10:10 AM   Clinical Narrative:    Patient will DC to: [facility] Anticipated DC date: 10/01/22 Family notified: Son, Octa Transport by: Corey Harold (requested by son)   Per MD patient ready for DC to [facility]. RN to call report prior to discharge [phone] room 509p). RN, patient, patient's family, and facility notified of DC. Discharge Summary and FL2 sent to facility. DC packet on chart. Ambulance transport requested for patient.   CSW will sign off for now as social work intervention is no longer needed. Please consult [place] again if new needs arise.     Final next level of care: Skilled Nursing Facility Barriers to Discharge: Barriers Resolved   Patient Goals and CMS Choice CMS Medicare.gov Compare Post Acute Care list provided to:: Patient Choice offered to / list presented to : Patient, Adult Children  Discharge Placement     Existing PASRR number confirmed : 10/01/22          Patient chooses bed at: [facility] Patient to be transferred to facility by: ptar Name of family member notified: Joy Patient and family notified of of transfer: 10/01/22  Discharge Plan and Services Additional resources added to the After Visit Summary for   In-house Referral: Clinical Social Work   Post Acute Care Choice: [place]                               Social Determinants of Health (SDOH) Interventions SDOH Screenings   Food Insecurity: No Food Insecurity (08/19/2022)  Housing: Low Risk  (08/19/2022)  Transportation Needs: No Transportation Needs (08/19/2022)  Utilities: Not At Risk (08/19/2022)  Alcohol Screen: Low Risk  (08/19/2022)  Depression (PHQ2-9): Low Risk  (09/08/2022)  Financial Resource Strain: Low Risk  (08/19/2022)  Physical  Activity: Inactive (08/19/2022)  Social Connections: Moderately Integrated (08/19/2022)  Stress: No Stress Concern Present (08/19/2022)  Tobacco Use: Low Risk  (09/27/2022)     Readmission Risk Interventions     No data to display

## 2022-10-01 NOTE — Progress Notes (Signed)
Ptar arrived to unit to take pt to [place] facility. Pt left unit in NAD.

## 2022-10-01 NOTE — Discharge Summary (Addendum)
PATIENT DETAILS Name: Janice Small Age: 89 y.o. Sex: female Date of Birth: 1928-07-09 MRN: [id number]. Admitting Physician: Thurnell Lose, MD PCP:de [place], Raymond J, MD  Admit Date: 09/25/2022 Discharge date: 10/01/2022  Recommendations for Outpatient Follow-up:  Follow up with PCP in 1-2 weeks Please obtain CMP/CBC in one week Resume Xarelto 5 days from 3/18 if no further GI bleeding Avoid NSAID's PPI twice daily dosing Ensure follow-up with gastroenterology-Dr. Carlean Purl Voiding trial/remove Foley in 1 week  Admitted From:  Home  Disposition: Skilled nursing facility   Discharge Condition: fair  CODE STATUS:   Code Status: Full Code   Diet recommendation:  Diet Order             Diet - low sodium heart healthy           DIET SOFT Room service appropriate? Yes; Fluid consistency: Thin  Diet effective now                    Brief Summary: 89 year old female with history of A-fib on Xarelto-who presented with upper GI bleeding and blood loss anemia.  Brief Hospital Course: Upper GI bleeding with acute blood loss anemia In the setting of NSAID/Xarelto use Received a total of 4 units of PRBC-last infusion on 3/16-Hb stable since then. Underwent GI evaluation including EGD/flex sig/capsule EGD EGD showed nonbleeding gastric ulcer/single nonbleeding AVM.  Capsule endoscopy with no additional findings other than already noted during EGD. No further BM since hospitalization--hemoglobin stable overnight. GI recommending to hold Xarelto 5 days from 3/18 Continue to avoid NSAIDs Continue PPI twice daily dosing Carafate twice daily x 2 weeks. Ensure outpatient follow-up with gastroenterologist-Dr. Carlean Purl.  Note EGD biopsy was negative for H. pylori and malignancy. Repeat CBC in 1 week  AKI on CKD stage IIIb AKI hemodynamically mediated Creatinine improved Repeat electrolytes in 1 week  Acute urinary retention Foley catheter inserted several days back-as  patient retained urine Flomax started on day of discharge Voiding trial at SNF recommended in approximately 1 week.  If voiding trial unsuccessful-will need urology follow-up.  Chronic HFpEF Volume is relatively stable Resume low-dose Lasix on discharge Resume Farxiga on discharge Follow volume status/electrolytes at SNF  Chronic atrial fibrillation Rate controlled with amiodarone/metoprolol Hold Xarelto 5 days from 3/18 per GI recommendations-attending MD at SNF to resume Xarelto if no further GI bleeding. Follow-up with cardiology as previously scheduled  HTN BP stable Continue low-dose Toprol/Lasix Resume clonidine, amlodipine-over the next several days at the discretion of attending MD at SNF as these medications were held due to soft blood pressure.  Hypothyroidism Synthroid  BMI: Estimated body mass index is 26.78 kg/m as calculated from the following:   Height as of this encounter: 5' (1.524 m).   Weight as of this encounter: 62.2 kg.   Note-son Joey updated over the phone on 3/20.  Discharge Diagnoses:  Principal Problem:   GI bleed Active Problems:   Acute renal failure superimposed on stage 3b chronic kidney disease (HCC)   Acute blood loss anemia   Elevated troponin   Chronic diastolic CHF (congestive heart failure) (HCC)   Atrial fibrillation (HCC)   Lactic acidosis   Essential hypertension   Hypothyroidism   Dark stools   Heme positive stool   Symptomatic anemia   Hemorrhoids   Hematochezia   Diverticulosis of colon with hemorrhage   AVM (arteriovenous malformation) of colon   Discharge Instructions:  Activity:  As tolerated with Full fall precautions use walker/cane & assistance  as needed  Discharge Instructions     (HEART FAILURE PATIENTS) Call MD:  Anytime you have any of the following symptoms: 1) 3 pound weight gain in 24 hours or 5 pounds in 1 week 2) shortness of breath, with or without a dry hacking cough 3) swelling in the hands, feet  or stomach 4) if you have to sleep on extra pillows at night in order to breathe.   Complete by: As directed    Call MD for:  difficulty breathing, headache or visual disturbances   Complete by: As directed    Diet - low sodium heart healthy   Complete by: As directed    Discharge instructions   Complete by: As directed    Follow with Primary MD  [place], Blondell Reveal, MD in 1-2 weeks  Please get a complete blood count and chemistry panel checked by your Primary MD at your next visit, and again as instructed by your Primary MD.  Get Medicines reviewed and adjusted: Please take all your medications with you for your next visit with your Primary MD  Laboratory/radiological data: Please request your Primary MD to go over all hospital tests and procedure/radiological results at the follow up, please ask your Primary MD to get all Hospital records sent to his/her office.  In some cases, they will be blood work, cultures and biopsy results pending at the time of your discharge. Please request that your primary care M.D. follows up on these results.  Also Note the following: If you experience worsening of your admission symptoms, develop shortness of breath, life threatening emergency, suicidal or homicidal thoughts you must seek medical attention immediately by calling 911 or calling your MD immediately  if symptoms less severe.  You must read complete instructions/literature along with all the possible adverse reactions/side effects for all the Medicines you take and that have been prescribed to you. Take any new Medicines after you have completely understood and accpet all the possible adverse reactions/side effects.   Do not drive when taking Pain medications or sleeping medications (Benzodaizepines)  Do not take more than prescribed Pain, Sleep and Anxiety Medications. It is not advisable to combine anxiety,sleep and pain medications without talking with your primary care  practitioner  Special Instructions: If you have smoked or chewed Tobacco  in the last 2 yrs please stop smoking, stop any regular Alcohol  and or any Recreational drug use.  Wear Seat belts while driving.  Please note: You were cared for by a hospitalist during your hospital stay. Once you are discharged, your primary care physician will handle any further medical issues. Please note that NO REFILLS for any discharge medications will be authorized once you are discharged, as it is imperative that you return to your primary care physician (or establish a relationship with a primary care physician if you do not have one) for your post hospital discharge needs so that they can reassess your need for medications and monitor your lab values.   Increase activity slowly   Complete by: As directed    No dressing needed   Complete by: As directed       Allergies as of 10/01/2022       Reactions   Codeine Phosphate Nausea And Vomiting        Medication List     STOP taking these medications    amLODipine 5 MG tablet Commonly known as: NORVASC   cloNIDine 0.1 MG tablet Commonly known as: CATAPRES   prednisoLONE acetate  1 % ophthalmic suspension Commonly known as: PRED FORTE   Prolensa 0.07 % Soln Generic drug: Bromfenac Sodium   Rivaroxaban 15 MG Tabs tablet Commonly known as: Xarelto       TAKE these medications    amiodarone 200 MG tablet Commonly known as: PACERONE Twice daily until 3/26-then switch to once daily dosing What changed: See the new instructions.   Clobetasol Propionate E 0.05 % emollient cream Generic drug: Clobetasol Prop Emollient Base Apply topically 2 (two) times daily.   dapagliflozin propanediol 10 MG Tabs tablet Commonly known as: FARXIGA Take 1 tablet (10 mg total) by mouth daily.   furosemide 40 MG tablet Commonly known as: LASIX Take 0.5 tablets (20 mg total) by mouth daily. What changed: how much to take   metoprolol succinate 25 MG 24  hr tablet Commonly known as: TOPROL-XL Take 1 tablet (25 mg total) by mouth daily.   pantoprazole 40 MG tablet Commonly known as: PROTONIX Take 1 tablet (40 mg total) by mouth 2 (two) times daily.   potassium chloride 10 MEQ tablet Commonly known as: KLOR-CON M Take 1 tablet (10 mEq total) by mouth daily.   sucralfate 1 g tablet Commonly known as: Carafate Take 1 tablet (1 g total) by mouth 2 (two) times daily for 14 days.   Synthroid 75 MCG tablet Generic drug: levothyroxine TAKE 1 TABLET BY MOUTH DAILY What changed:  how much to take when to take this   VITAMIN D PO Take 2,000 Units by mouth daily after lunch.               Discharge Care Instructions  (From admission, onward)           Start     Ordered   10/01/22 0000  No dressing needed        10/01/22 [id number]            Contact information for follow-up providers     [place], Blondell Reveal, MD. Schedule an appointment as soon as possible for a visit in 1 week(s).   Specialty: Family Medicine Contact information: [place] [address] [phone]         Gatha Mayer, MD Follow up in 2 week(s).   Specialty: Gastroenterology Contact information: [address] [place] [address] [phone]         Donato Heinz, MD Follow up in 2 week(s).   Specialties: Cardiology, Radiology Contact information: [address] [place] [address] [phone]              Contact information for after-discharge care     [facility] .   Service: Skilled Nursing Contact information: [place] [address] [phone]                    Allergies  Allergen Reactions   Codeine Phosphate Nausea And Vomiting     Other Procedures/Studies: DG CHEST PORT 1 VIEW  Result Date: 09/25/2022 CLINICAL DATA:  Shortness of breath, atrial  fibrillation EXAM: PORTABLE CHEST 1 VIEW COMPARISON:  Previous studies including the examination done on 01/09/2022 FINDINGS: Transverse diameter of heart is increased. There are no signs of pulmonary edema or focal pulmonary consolidation. There is no significant pleural effusion or pneumothorax. Degenerative changes with small bony spurs are noted in both shoulders. IMPRESSION: There are no signs of pulmonary edema or focal pulmonary consolidation.  Electronically Signed   By: Elmer Picker M.D.   On: 09/25/2022 09:40   Intravitreal Injection, Pharmacologic Agent - OS - Left Eye  Result Date: 09/11/2022 Time Out 09/11/2022. 9:49 AM. Confirmed correct patient, procedure, site, and patient consented. Anesthesia Topical anesthesia was used. Anesthetic medications included Lidocaine 2%, Proparacaine 0.5%. Procedure Preparation included 5% betadine to ocular surface, eyelid speculum. A (32g) needle was used. Injection: 6 mg faricimab-svoa 6 MG/0.05ML   Route: Intravitreal, Site: Left Eye   NDC: [id number], Lot: [id number], Expiration date: 08/13/2024, Waste: 0 mL Post-op Post injection exam found visual acuity of at least counting fingers. The patient tolerated the procedure well. There were no complications. The patient received written and verbal post procedure care education. Post injection medications were not given.   OCT, Retina - OU - Both Eyes  Result Date: 09/11/2022 Right Eye Quality was good. Central Foveal Thickness: 268. Progression has been stable. Findings include normal foveal contour, no IRF, no SRF, retinal drusen . Left Eye Quality was good. Central Foveal Thickness: 289. Progression has worsened. Findings include normal foveal contour, no SRF, retinal drusen , intraretinal hyper-reflective material, intraretinal fluid (Mild interval increase in IRF/IRHM temporal macula and fovea ). Notes *Images captured and stored on drive Diagnosis / Impression: OD: non-exu ARMD w/ fine drusen OS: BRVO  w/ CME -- Mild interval increase in IRF/IRHM temporal macula and fovea Clinical management: See below Abbreviations: NFP - Normal foveal profile. CME - cystoid macular edema. PED - pigment epithelial detachment. IRF - intraretinal fluid. SRF - subretinal fluid. EZ - ellipsoid zone. ERM - epiretinal membrane. ORA - outer retinal atrophy. ORT - outer retinal tubulation. SRHM - subretinal hyper-reflective material. IRHM - intraretinal hyper-reflective material     TODAY-DAY OF DISCHARGE:  Subjective:   Janice Small today has no headache,no chest abdominal pain,no new weakness tingling or numbness, feels much better wants to go home today.   Objective:   Blood pressure 114/64, pulse (!) 55, temperature 97.7 F (36.5 C), temperature source Oral, resp. rate 16, height 5' (1.524 m), weight 62.2 kg, SpO2 99 %.  Intake/Output Summary (Last 24 hours) at 10/01/2022 0903 Last data filed at 09/30/2022 1748 Gross per 24 hour  Intake 460 ml  Output --  Net 460 ml   Filed Weights   09/26/22 1009  Weight: 62.2 kg    Exam: Awake Alert, Oriented *3, No new F.N deficits, Normal affect [place].AT,PERRAL Supple Neck,No JVD, No cervical lymphadenopathy appriciated.  Symmetrical Chest wall movement, Good air movement bilaterally, CTAB RRR,No Gallops,Rubs or new Murmurs, No Parasternal Heave +ve B.Sounds, Abd Soft, Non tender, No organomegaly appriciated, No rebound -guarding or rigidity. No Cyanosis, Clubbing or edema, No new Rash or bruise   PERTINENT RADIOLOGIC STUDIES: No results found.   PERTINENT LAB RESULTS: CBC: Recent Labs    09/30/22 0325 10/01/22 0237  WBC 7.4 6.4  HGB 8.3* 8.5*  HCT 25.7* 27.2*  PLT 141* 117*   CMET CMP     Component Value Date/Time   NA 136 10/01/2022 0237   NA 141 07/31/2022 1524   K 4.0 10/01/2022 0237   CL 109 10/01/2022 0237   CO2 21 (L) 10/01/2022 0237   GLUCOSE 105 (H) 10/01/2022 0237   BUN 30 (H) 10/01/2022 0237   BUN 34 07/31/2022 1524    CREATININE 1.26 (H) 10/01/2022 0237   CREATININE 0.93 (H) 03/29/2020 1030   CALCIUM 7.8 (L) 10/01/2022 0237   PROT 5.0 (L) 09/25/2022 0230   PROT 6.3 12/23/2021  1519   ALBUMIN 3.2 (L) 09/25/2022 0230   ALBUMIN 4.7 (H) 12/23/2021 1519   AST 45 (H) 09/25/2022 0230   ALT 35 09/25/2022 0230   ALKPHOS 31 (L) 09/25/2022 0230   BILITOT 0.8 09/25/2022 0230   BILITOT 0.4 12/23/2021 1519   GFRNONAA 40 (L) 10/01/2022 0237   GFRNONAA 57 (L) 09/23/2018 1456   GFRAA 66 09/23/2018 1456    GFR Estimated Creatinine Clearance: 23.5 mL/min (A) (by C-G formula based on SCr of 1.26 mg/dL (H)). No results for input(s): "LIPASE", "AMYLASE" in the last 72 hours. No results for input(s): "CKTOTAL", "CKMB", "CKMBINDEX", "TROPONINI" in the last 72 hours. Invalid input(s): "POCBNP" No results for input(s): "DDIMER" in the last 72 hours. No results for input(s): "HGBA1C" in the last 72 hours. No results for input(s): "CHOL", "HDL", "LDLCALC", "TRIG", "CHOLHDL", "LDLDIRECT" in the last 72 hours. No results for input(s): "TSH", "T4TOTAL", "T3FREE", "THYROIDAB" in the last 72 hours.  Invalid input(s): "FREET3" No results for input(s): "VITAMINB12", "FOLATE", "FERRITIN", "TIBC", "IRON", "RETICCTPCT" in the last 72 hours. Coags: No results for input(s): "INR" in the last 72 hours.  Invalid input(s): "PT" Microbiology: No results found for this or any previous visit (from the past 240 hour(s)).  FURTHER DISCHARGE INSTRUCTIONS:  Get Medicines reviewed and adjusted: Please take all your medications with you for your next visit with your Primary MD  Laboratory/radiological data: Please request your Primary MD to go over all hospital tests and procedure/radiological results at the follow up, please ask your Primary MD to get all Hospital records sent to his/her office.  In some cases, they will be blood work, cultures and biopsy results pending at the time of your discharge. Please request that your primary  care M.D. goes through all the records of your hospital data and follows up on these results.  Also Note the following: If you experience worsening of your admission symptoms, develop shortness of breath, life threatening emergency, suicidal or homicidal thoughts you must seek medical attention immediately by calling 911 or calling your MD immediately  if symptoms less severe.  You must read complete instructions/literature along with all the possible adverse reactions/side effects for all the Medicines you take and that have been prescribed to you. Take any new Medicines after you have completely understood and accpet all the possible adverse reactions/side effects.   Do not drive when taking Pain medications or sleeping medications (Benzodaizepines)  Do not take more than prescribed Pain, Sleep and Anxiety Medications. It is not advisable to combine anxiety,sleep and pain medications without talking with your primary care practitioner  Special Instructions: If you have smoked or chewed Tobacco  in the last 2 yrs please stop smoking, stop any regular Alcohol  and or any Recreational drug use.  Wear Seat belts while driving.  Please note: You were cared for by a hospitalist during your hospital stay. Once you are discharged, your primary care physician will handle any further medical issues. Please note that NO REFILLS for any discharge medications will be authorized once you are discharged, as it is imperative that you return to your primary care physician (or establish a relationship with a primary care physician if you do not have one) for your post hospital discharge needs so that they can reassess your need for medications and monitor your lab values.  Total Time spent coordinating discharge including counseling, education and face to face time equals greater than 30 minutes.  SignedOren Binet 10/01/2022 9:03 AM

## 2022-10-01 NOTE — Progress Notes (Signed)
Physical Therapy Treatment Patient Details Name: Janice Small MRN: [id number] DOB: 03/22/1928 Today's Date: 10/01/2022   History of Present Illness 89 y/o F admitted to [facility] on 3/14 for weakness, along with lightheadedness, episodes of black stool, and dyspnea on exertion. Hgb found to be 3.9, improved after 3 units of PRBCs. S/p EGD and flexible sigmoidoscopy 3/15. PMHx: chronic a-fib on Xarelto, HTN, hypothyroidism, diastolic CHF, primary hyperparathyroidism, prolapsing bleeding hemorrhoids s/p banding.    PT Comments    Pt tolerated today's session well, but continues to be mildly self limiting with fatigue, requiring encouragement to participate. Pt ambulating to the door with minG and RW, minA required for bed mobility and transfers. Pt demonstrating fair static standing balance for ADLs at sink but reliant on UE support for ambulation. Pt cued for hand placement prior to transfers, good carryover noted with repetition from chair, able to progress to minG but requiring a few trials to stand with fatigue. Acute PT will continue to follow up with pt to progress mobility, discharge recommendations remain appropriate.     Recommendations for follow up therapy are one component of a multi-disciplinary discharge planning process, led by the attending physician.  Recommendations may be updated based on patient status, additional functional criteria and insurance authorization.  Follow Up Recommendations  Skilled nursing-short term rehab (<3 hours/day) Can patient physically be transported by private vehicle: Yes   Assistance Recommended at Discharge Intermittent Supervision/Assistance  Patient can return home with the following A little help with walking and/or transfers;Assist for transportation;Help with stairs or ramp for entrance;Assistance with cooking/housework   Equipment Recommendations  None recommended by PT    Recommendations for Other Services       Precautions / Restrictions  Precautions Precautions: Fall Restrictions Weight Bearing Restrictions: No     Mobility  Bed Mobility Overal bed mobility: Needs Assistance Bed Mobility: Supine to Sit     Supine to sit: Min assist     General bed mobility comments: minA for trunk support and encouragement for pt management of BLE    Transfers Overall transfer level: Needs assistance Equipment used: Rolling walker (2 wheels) Transfers: Sit to/from Stand Sit to Stand: Min assist           General transfer comment: Min A to power up with cues for hand placement, progressing to minG with repetition from chair    Ambulation/Gait Ambulation/Gait assistance: Min guard Gait Distance (Feet): 30 Feet Assistive device: Rolling walker (2 wheels) Gait Pattern/deviations: Step-through pattern, Decreased stride length, Trunk flexed, Shuffle Gait velocity: decreased     General Gait Details: slow gait with shuffled pattern, cued for upright posture and increased step length, pt minimally compliant. Standing rest break at sink for ADLs   Stairs             Wheelchair Mobility    Modified Rankin (Stroke Patients Only)       Balance Overall balance assessment: Needs assistance Sitting-balance support: Feet supported, No upper extremity supported Sitting balance-Leahy Scale: Fair     Standing balance support: During functional activity, Bilateral upper extremity supported, Reliant on assistive device for balance Standing balance-Leahy Scale: Poor Standing balance comment: reliant on UE support for ambulation, able to stand statically with fair balance and no UE support                            Cognition Arousal/Alertness: Awake/alert Behavior During Therapy: WFL for tasks assessed/performed Overall Cognitive Status: No  family/caregiver present to determine baseline cognitive functioning                                 General Comments: Overall WFL for basic tasks  assessed. Needs encouragement to participate when fatigued, verbal cues required but good carryover with repetition of tasks        Exercises Other Exercises Other Exercises: sit<>stand x5 reps from chair for BLE strengthening    General Comments General comments (skin integrity, edema, etc.): VSS on room air      Pertinent Vitals/Pain Pain Assessment Pain Assessment: Faces Faces Pain Scale: Hurts a little bit Pain Location: bottom Pain Descriptors / Indicators: Discomfort Pain Intervention(s): Repositioned, Monitored during session    Home Living                          Prior Function            PT Goals (current goals can now be found in the care plan section) Acute Rehab PT Goals Patient Stated Goal: go home PT Goal Formulation: With patient/family Time For Goal Achievement: 10/10/22 Potential to Achieve Goals: Good Progress towards PT goals: Progressing toward goals    Frequency    Min 3X/week      PT Plan Current plan remains appropriate    Co-evaluation              AM-PAC PT "6 Clicks" Mobility   Outcome Measure  Help needed turning from your back to your side while in a flat bed without using bedrails?: A Little Help needed moving from lying on your back to sitting on the side of a flat bed without using bedrails?: A Little Help needed moving to and from a bed to a chair (including a wheelchair)?: A Little Help needed standing up from a chair using your arms (e.g., wheelchair or bedside chair)?: A Little Help needed to walk in hospital room?: A Little Help needed climbing 3-5 steps with a railing? : A Lot 6 Click Score: 17    End of Session Equipment Utilized During Treatment: Gait belt Activity Tolerance: Patient limited by fatigue Patient left: in chair;with call bell/phone within reach;with chair alarm set Nurse Communication: Mobility status PT Visit Diagnosis: Other abnormalities of gait and mobility (R26.89);Muscle weakness  (generalized) (M62.81)     Time: [id number] PT Time Calculation (min) (ACUTE ONLY): 24 min  Charges:  $Gait Training: 8-22 mins $Therapeutic Activity: 8-22 mins                     Janice Small, PT DPT Acute Rehabilitation Services Office [phone]    Janice Small 10/01/2022, 2:13 PM

## 2022-10-01 NOTE — Progress Notes (Signed)
Report called and given to nurse Shonna Chock, at [facility] [phone]. Pt will be leaving with indwelling Foley catheter, per physician order, and will have a voiding trial 1 week after discharge. Pt in NAD at this time.

## 2022-10-02 ENCOUNTER — Telehealth (HOSPITAL_COMMUNITY): Payer: Self-pay | Admitting: *Deleted

## 2022-10-02 NOTE — Telephone Encounter (Signed)
Pt with recent hospitalization due to GI bleed. Son states pt is currently off Xarelto for several more days before restarting. DCCV for 4/2 canceled and will plan to reassess treatment plan at follow up on 4/10. Pt son in agreement.

## 2022-10-03 ENCOUNTER — Telehealth: Payer: Self-pay

## 2022-10-03 NOTE — Telephone Encounter (Signed)
Patient discharge from hospital 10/02/22 and admitted to [facility]. Spoke with the nurse LaNeatra. She confirms labs can be drawn at [facility]. She will require a written order. Faxed to the number provided [phone] a written order for  1) CBC in 1 week 2) arrange transportation for the follow up appointment 10/23/22 at 10:30 am with Dr Carlean Purl at [facility] 3rd floor.  Order stamped with Dr Carlean Purl signature.

## 2022-10-03 NOTE — Telephone Encounter (Signed)
-----   Message from Alfredia Ferguson, PA-C sent at 10/01/2022 10:20 AM EDT ----- Regarding: RE: office follow up Yes that is ok , ans OK for labs in my name  ----- Message ----- From: Greggory Keen, LPN Sent: [id number]  10:43 AM EDT To: Alfredia Ferguson, PA-C Subject: RE: office follow up                           She has an appointment scheduled with him on 10/23/22. Will that be okay?  Do you want the labs ordered in your name? 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  She will need a CBC in one week   Needs to have a follow  up appt with Dr Carlean Purl in one month - please call her with the appt tomorrow, and about labs   Thank you

## 2022-10-06 NOTE — Telephone Encounter (Signed)
Received call from "Juanda Crumble, RN" Unit Manager of [employer]. He reports the was a care planning meeting today with the family. He wanted to confirm the orders for the patient. The family has inquired. Verbal order for CBC and the appointment for 10/23/22 given to him.

## 2022-10-08 ENCOUNTER — Encounter (HOSPITAL_COMMUNITY): Payer: Medicare Other | Admitting: Physician Assistant

## 2022-10-10 ENCOUNTER — Encounter (INDEPENDENT_AMBULATORY_CARE_PROVIDER_SITE_OTHER): Payer: Medicare Other | Admitting: Ophthalmology

## 2022-10-16 ENCOUNTER — Ambulatory Visit (HOSPITAL_COMMUNITY): Admit: 2022-10-16 | Payer: Medicare Other | Admitting: Cardiology

## 2022-10-16 ENCOUNTER — Encounter (HOSPITAL_COMMUNITY): Payer: Self-pay

## 2022-10-16 SURGERY — CARDIOVERSION
Anesthesia: General

## 2022-10-16 NOTE — Telephone Encounter (Signed)
Prolia VOB initiated via [email or web address]  Last OV: 08/01/22 Next OV:  Last Prolia inj 05/21/22 Next Prolia inj due 11/20/22

## 2022-10-22 ENCOUNTER — Ambulatory Visit (HOSPITAL_COMMUNITY): Payer: Medicare Other | Admitting: Internal Medicine

## 2022-10-23 ENCOUNTER — Encounter (INDEPENDENT_AMBULATORY_CARE_PROVIDER_SITE_OTHER): Payer: Medicare Other | Admitting: Ophthalmology

## 2022-10-23 ENCOUNTER — Ambulatory Visit: Payer: Medicare Other | Admitting: Internal Medicine

## 2022-10-23 DIAGNOSIS — H353131 Nonexudative age-related macular degeneration, bilateral, early dry stage: Secondary | ICD-10-CM

## 2022-10-23 DIAGNOSIS — H34832 Tributary (branch) retinal vein occlusion, left eye, with macular edema: Secondary | ICD-10-CM

## 2022-10-23 DIAGNOSIS — H40003 Preglaucoma, unspecified, bilateral: Secondary | ICD-10-CM

## 2022-10-23 DIAGNOSIS — H04123 Dry eye syndrome of bilateral lacrimal glands: Secondary | ICD-10-CM

## 2022-10-23 DIAGNOSIS — H35033 Hypertensive retinopathy, bilateral: Secondary | ICD-10-CM

## 2022-10-23 DIAGNOSIS — I10 Essential (primary) hypertension: Secondary | ICD-10-CM

## 2022-10-23 DIAGNOSIS — Z961 Presence of intraocular lens: Secondary | ICD-10-CM

## 2022-10-23 IMAGING — DX DG THORACOLUMBAR SPINE 2V
2 series · 4 of 4 positions shown · non-contrast
Comparison: Remote exam 04/15/2008

CLINICAL DATA: [AGE] with scoliosis and pain. Chronic back
pain.

EXAM:
THORACOLUMBAR SPINE 2V

[Series 1: tl-spine ap · 0.14mm/px · 2 of 2 slices shown]
[im 1/2]
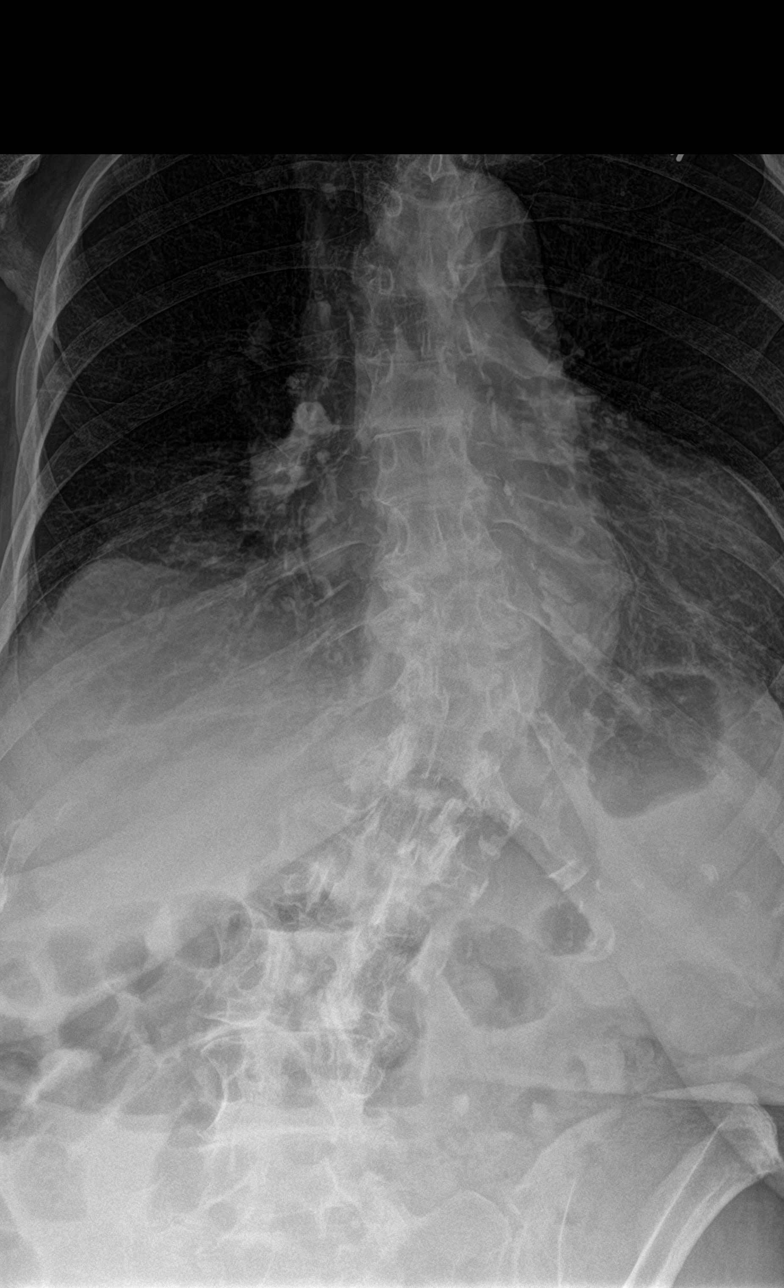
[im 2/2]
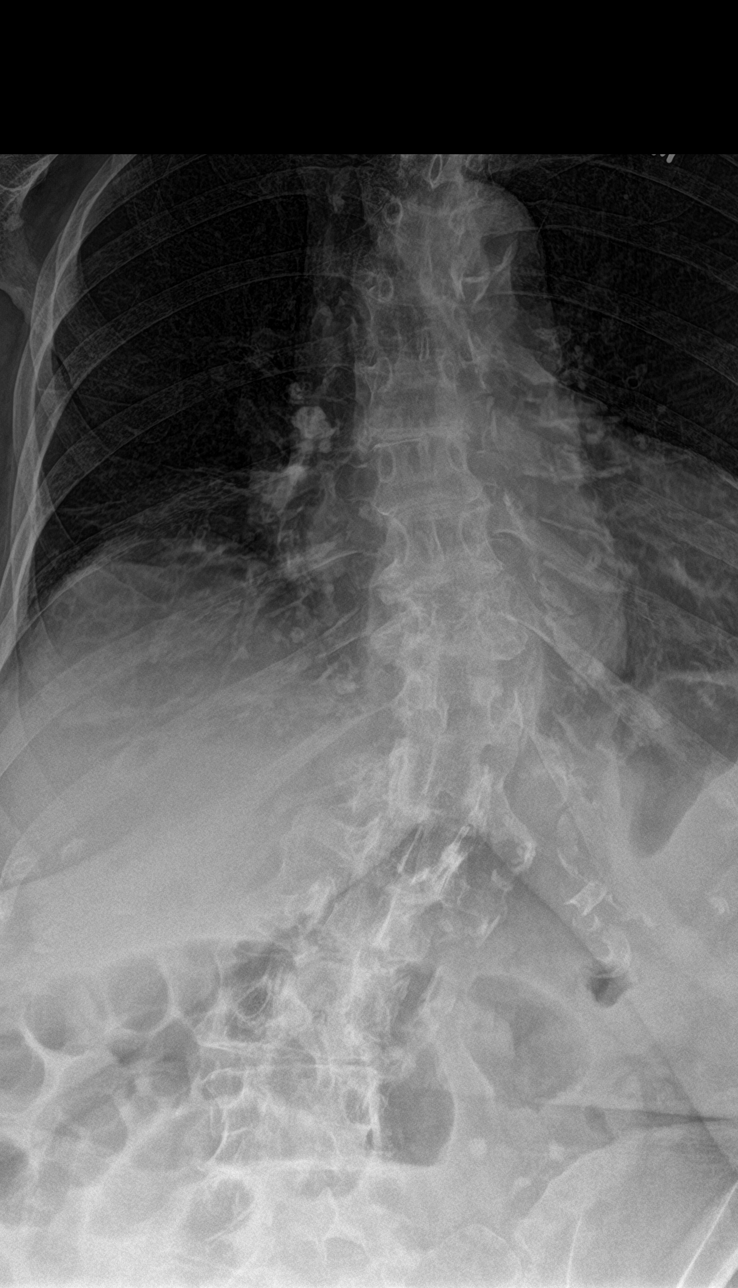

[Series 2: tl-spine lat · 0.14mm/px · 2 of 2 slices shown]
[im 1/2]
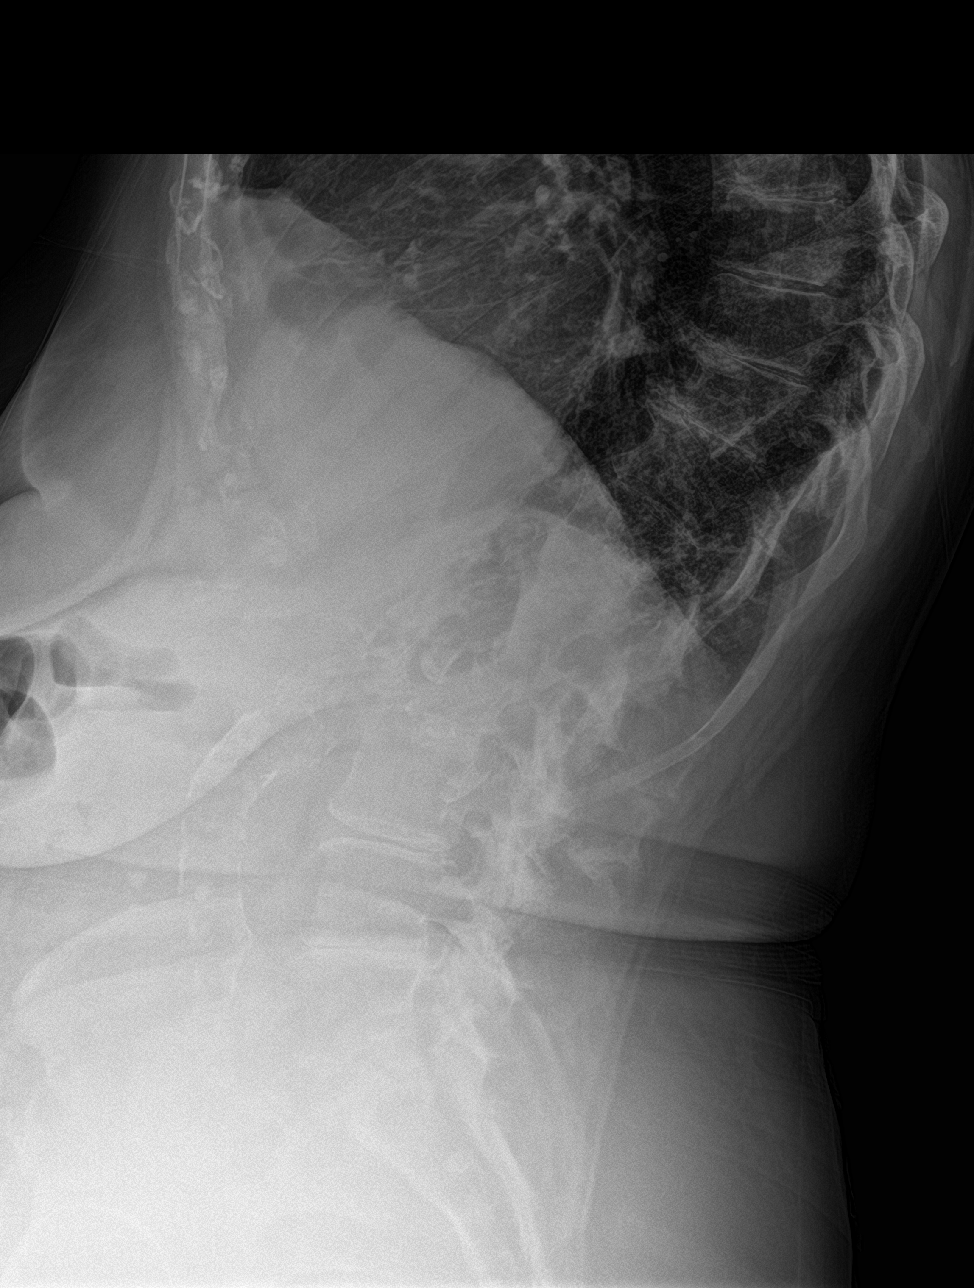
[im 2/2]
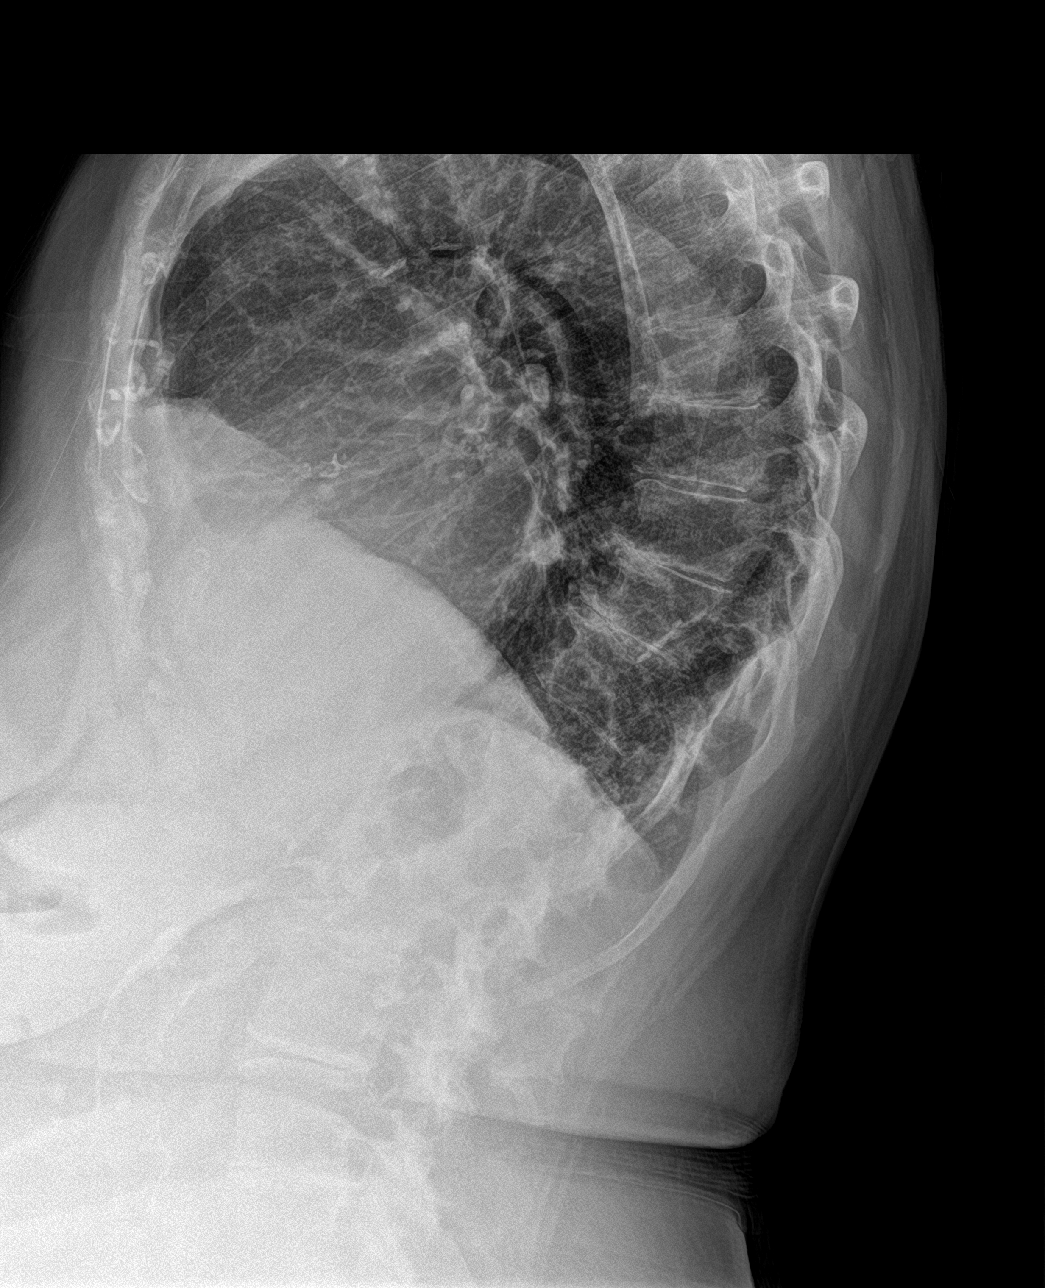

[4 of 4 positions shown; findings below may reference images not displayed]

FINDINGS: Progressive thoracolumbar scoliosis from remote 5333 exam. 24
degrees dextroscoliotic curvature of the mid upper thoracic spine.
37 degrees levo scoliotic curvature of the lower thoracic and upper
lumbar spine. Normal thoracic kyphosis and lumbar lordosis. The
bones are diffusely under mineralized. There is no evidence of
compression fracture, although the thoracolumbar junction is not
well evaluated due to osseous overlap and scoliosis. Diffuse and
multilevel degenerative disc disease and facet hypertrophy. Moderate
aortic atherosclerosis.
IMPRESSION: 1. Progressive thoracolumbar scoliosis since [DATE]. Diffuse degenerative change with degenerative disc disease and
facet hypertrophy.
3. Osteopenia/osteoporosis.

## 2022-10-27 ENCOUNTER — Emergency Department (HOSPITAL_COMMUNITY): Payer: Medicare Other

## 2022-10-27 ENCOUNTER — Inpatient Hospital Stay (HOSPITAL_COMMUNITY)
Admission: EM | Admit: 2022-10-27 | Discharge: 2022-11-12 | DRG: 291 | Disposition: E | Payer: Medicare Other | Source: Skilled Nursing Facility | Attending: Internal Medicine | Admitting: Internal Medicine

## 2022-10-27 ENCOUNTER — Encounter (HOSPITAL_COMMUNITY): Payer: Self-pay

## 2022-10-27 DIAGNOSIS — Z90710 Acquired absence of both cervix and uterus: Secondary | ICD-10-CM

## 2022-10-27 DIAGNOSIS — E8809 Other disorders of plasma-protein metabolism, not elsewhere classified: Secondary | ICD-10-CM | POA: Diagnosis present

## 2022-10-27 DIAGNOSIS — H35033 Hypertensive retinopathy, bilateral: Secondary | ICD-10-CM | POA: Diagnosis present

## 2022-10-27 DIAGNOSIS — N1832 Chronic kidney disease, stage 3b: Secondary | ICD-10-CM | POA: Diagnosis present

## 2022-10-27 DIAGNOSIS — I130 Hypertensive heart and chronic kidney disease with heart failure and stage 1 through stage 4 chronic kidney disease, or unspecified chronic kidney disease: Secondary | ICD-10-CM | POA: Diagnosis not present

## 2022-10-27 DIAGNOSIS — Z8719 Personal history of other diseases of the digestive system: Secondary | ICD-10-CM

## 2022-10-27 DIAGNOSIS — E039 Hypothyroidism, unspecified: Secondary | ICD-10-CM | POA: Diagnosis present

## 2022-10-27 DIAGNOSIS — J189 Pneumonia, unspecified organism: Secondary | ICD-10-CM | POA: Diagnosis not present

## 2022-10-27 DIAGNOSIS — I5021 Acute systolic (congestive) heart failure: Secondary | ICD-10-CM | POA: Diagnosis present

## 2022-10-27 DIAGNOSIS — I081 Rheumatic disorders of both mitral and tricuspid valves: Secondary | ICD-10-CM | POA: Diagnosis present

## 2022-10-27 DIAGNOSIS — E876 Hypokalemia: Secondary | ICD-10-CM | POA: Diagnosis present

## 2022-10-27 DIAGNOSIS — I10 Essential (primary) hypertension: Secondary | ICD-10-CM | POA: Diagnosis present

## 2022-10-27 DIAGNOSIS — D631 Anemia in chronic kidney disease: Secondary | ICD-10-CM | POA: Diagnosis present

## 2022-10-27 DIAGNOSIS — I4891 Unspecified atrial fibrillation: Secondary | ICD-10-CM | POA: Diagnosis present

## 2022-10-27 DIAGNOSIS — R570 Cardiogenic shock: Secondary | ICD-10-CM | POA: Diagnosis not present

## 2022-10-27 DIAGNOSIS — J449 Chronic obstructive pulmonary disease, unspecified: Secondary | ICD-10-CM

## 2022-10-27 DIAGNOSIS — Z79899 Other long term (current) drug therapy: Secondary | ICD-10-CM

## 2022-10-27 DIAGNOSIS — Z885 Allergy status to narcotic agent status: Secondary | ICD-10-CM

## 2022-10-27 DIAGNOSIS — J9621 Acute and chronic respiratory failure with hypoxia: Secondary | ICD-10-CM | POA: Diagnosis present

## 2022-10-27 DIAGNOSIS — I5033 Acute on chronic diastolic (congestive) heart failure: Secondary | ICD-10-CM | POA: Diagnosis not present

## 2022-10-27 DIAGNOSIS — I509 Heart failure, unspecified: Principal | ICD-10-CM

## 2022-10-27 DIAGNOSIS — Z7901 Long term (current) use of anticoagulants: Secondary | ICD-10-CM

## 2022-10-27 DIAGNOSIS — I480 Paroxysmal atrial fibrillation: Secondary | ICD-10-CM | POA: Diagnosis present

## 2022-10-27 DIAGNOSIS — I4819 Other persistent atrial fibrillation: Secondary | ICD-10-CM

## 2022-10-27 DIAGNOSIS — Z6826 Body mass index (BMI) 26.0-26.9, adult: Secondary | ICD-10-CM

## 2022-10-27 DIAGNOSIS — I272 Pulmonary hypertension, unspecified: Secondary | ICD-10-CM | POA: Diagnosis present

## 2022-10-27 DIAGNOSIS — N179 Acute kidney failure, unspecified: Secondary | ICD-10-CM | POA: Diagnosis present

## 2022-10-27 DIAGNOSIS — Z9981 Dependence on supplemental oxygen: Secondary | ICD-10-CM

## 2022-10-27 DIAGNOSIS — J440 Chronic obstructive pulmonary disease with acute lower respiratory infection: Secondary | ICD-10-CM | POA: Diagnosis not present

## 2022-10-27 DIAGNOSIS — Z79890 Hormone replacement therapy (postmenopausal): Secondary | ICD-10-CM

## 2022-10-27 DIAGNOSIS — E440 Moderate protein-calorie malnutrition: Secondary | ICD-10-CM | POA: Diagnosis present

## 2022-10-27 DIAGNOSIS — Z66 Do not resuscitate: Secondary | ICD-10-CM | POA: Diagnosis present

## 2022-10-27 DIAGNOSIS — Z515 Encounter for palliative care: Secondary | ICD-10-CM

## 2022-10-27 DIAGNOSIS — Z8249 Family history of ischemic heart disease and other diseases of the circulatory system: Secondary | ICD-10-CM

## 2022-10-27 DIAGNOSIS — H353130 Nonexudative age-related macular degeneration, bilateral, stage unspecified: Secondary | ICD-10-CM | POA: Diagnosis present

## 2022-10-27 DIAGNOSIS — J9622 Acute and chronic respiratory failure with hypercapnia: Secondary | ICD-10-CM | POA: Diagnosis present

## 2022-10-27 LAB — CBC
HCT: 30.6 % — ABNORMAL LOW (ref 36.0–46.0)
Hemoglobin: 8.9 g/dL — ABNORMAL LOW (ref 12.0–15.0)
MCH: 30 pg (ref 26.0–34.0)
MCHC: 29.1 g/dL — ABNORMAL LOW (ref 30.0–36.0)
MCV: 103 fL — ABNORMAL HIGH (ref 80.0–100.0)
Platelets: 227 10*3/uL (ref 150–400)
RBC: 2.97 MIL/uL — ABNORMAL LOW (ref 3.87–5.11)
RDW: 18.7 % — ABNORMAL HIGH (ref 11.5–15.5)
WBC: 6.9 10*3/uL (ref 4.0–10.5)
nRBC: 0 % (ref 0.0–0.2)

## 2022-10-27 LAB — COMPREHENSIVE METABOLIC PANEL
ALT: 30 U/L (ref 0–44)
AST: 23 U/L (ref 15–41)
Albumin: 2.6 g/dL — ABNORMAL LOW (ref 3.5–5.0)
Alkaline Phosphatase: 56 U/L (ref 38–126)
Anion gap: 10 (ref 5–15)
BUN: 20 mg/dL (ref 8–23)
CO2: 27 mmol/L (ref 22–32)
Calcium: 8.3 mg/dL — ABNORMAL LOW (ref 8.9–10.3)
Chloride: 99 mmol/L (ref 98–111)
Creatinine, Ser: 1.03 mg/dL — ABNORMAL HIGH (ref 0.44–1.00)
GFR, Estimated: 51 mL/min — ABNORMAL LOW (ref >60)
Glucose, Bld: 100 mg/dL — ABNORMAL HIGH (ref 70–99)
Potassium: 3.8 mmol/L (ref 3.5–5.1)
Sodium: 136 mmol/L (ref 135–145)
Total Bilirubin: 0.8 mg/dL (ref 0.3–1.2)
Total Protein: 5.2 g/dL — ABNORMAL LOW (ref 6.5–8.1)

## 2022-10-27 LAB — TSH: TSH: 3.315 u[IU]/mL (ref 0.350–4.500)

## 2022-10-27 LAB — BRAIN NATRIURETIC PEPTIDE: B Natriuretic Peptide: 419.7 pg/mL — ABNORMAL HIGH (ref 0.0–100.0)

## 2022-10-27 MED ORDER — PANTOPRAZOLE SODIUM 40 MG PO TBEC
40.0000 mg | DELAYED_RELEASE_TABLET | Freq: Two times a day (BID) | ORAL | Status: DC
  Administered 2022-10-27 – 2022-10-29 (×5): 40 mg via ORAL
  Filled 2022-10-27 (×5): qty 1

## 2022-10-27 MED ORDER — ACETAMINOPHEN 650 MG RE SUPP: 650.0000 mg | Freq: Four times a day (QID) | RECTAL | Status: DC | PRN

## 2022-10-27 MED ORDER — RIVAROXABAN 15 MG PO TABS
15.0000 mg | ORAL_TABLET | Freq: Every day | ORAL | Status: DC
  Administered 2022-10-28 – 2022-10-29 (×2): 15 mg via ORAL
  Filled 2022-10-27 (×2): qty 1

## 2022-10-27 MED ORDER — POLYETHYLENE GLYCOL 3350 17 G PO PACK: 17.0000 g | PACK | Freq: Every day | ORAL | Status: DC | PRN

## 2022-10-27 MED ORDER — LEVOTHYROXINE SODIUM 88 MCG PO TABS
88.0000 ug | ORAL_TABLET | Freq: Every day | ORAL | Status: DC
  Administered 2022-10-28 – 2022-10-30 (×3): 88 ug via ORAL
  Filled 2022-10-27 (×3): qty 1

## 2022-10-27 MED ORDER — AMIODARONE HCL 200 MG PO TABS
200.0000 mg | ORAL_TABLET | Freq: Every day | ORAL | Status: DC
  Administered 2022-10-28 – 2022-10-29 (×2): 200 mg via ORAL
  Filled 2022-10-27 (×2): qty 1

## 2022-10-27 MED ORDER — METOPROLOL SUCCINATE ER 25 MG PO TB24
25.0000 mg | ORAL_TABLET | Freq: Every day | ORAL | Status: DC
  Administered 2022-10-28 – 2022-10-29 (×2): 25 mg via ORAL
  Filled 2022-10-27 (×2): qty 1

## 2022-10-27 MED ORDER — SODIUM CHLORIDE 0.9% FLUSH
3.0000 mL | Freq: Two times a day (BID) | INTRAVENOUS | Status: DC
  Administered 2022-10-27 – 2022-10-30 (×5): 3 mL via INTRAVENOUS

## 2022-10-27 MED ORDER — MELATONIN 5 MG PO TABS
5.0000 mg | ORAL_TABLET | Freq: Every day | ORAL | Status: DC
  Administered 2022-10-27 – 2022-10-29 (×3): 5 mg via ORAL
  Filled 2022-10-27 (×3): qty 1

## 2022-10-27 MED ORDER — FUROSEMIDE 10 MG/ML IJ SOLN
60.0000 mg | Freq: Two times a day (BID) | INTRAMUSCULAR | Status: DC
  Administered 2022-10-28 – 2022-10-29 (×3): 60 mg via INTRAVENOUS
  Filled 2022-10-27 (×3): qty 6

## 2022-10-27 MED ORDER — ACETAMINOPHEN 325 MG PO TABS
650.0000 mg | ORAL_TABLET | Freq: Four times a day (QID) | ORAL | Status: DC | PRN
  Administered 2022-10-28 – 2022-10-29 (×2): 650 mg via ORAL
  Filled 2022-10-27 (×2): qty 2

## 2022-10-27 MED ORDER — FUROSEMIDE 10 MG/ML IJ SOLN
40.0000 mg | Freq: Once | INTRAMUSCULAR | Status: AC
  Administered 2022-10-27: 40 mg via INTRAVENOUS
  Filled 2022-10-27: qty 4

## 2022-10-27 NOTE — ED Notes (Signed)
ED TO INPATIENT HANDOFF REPORT  ED Nurse Name and Phone #: Donnella Bi [phone]  S Name/Age/Gender Janice Small 89 y.o. female Room/Bed: 039C/039C  Code Status   Code Status: Full Code  Home/SNF/Other Home Patient oriented to: self, place, time, and situation Is this baseline? Yes   Triage Complete: Triage complete  Chief Complaint Acute on chronic diastolic CHF (congestive heart failure) [I50.33]  Triage Note Pt bib ems coming from [facility]. Pt arrived to facility 3 weeks ago after significant GI bleed with blood transfusions. Staff reports progressing fluid retention (leg and abdominal edema) and SOB x couple weeks. Lasix was recently started back/increased with no improvement. Pt alert and oriented in triage.   EMS vitals: 94% 2L [place] (on oxygen at baseline) BP 140/72 RR 32 HR 60-90 a fib Cbg 135   Allergies Allergies  Allergen Reactions   Codeine Phosphate Nausea And Vomiting    Level of Care/Admitting Diagnosis ED Disposition     ED Disposition  Admit   Condition  --   Comment  Hospital Area: [facility] [[id number]]  Level of Care: Telemetry Cardiac [103]  May place patient in observation at [facility] or [facility] if equivalent level of care is available:: No  Covid Evaluation: Asymptomatic - no recent exposure (last 10 days) testing not required  Diagnosis: Acute on chronic diastolic CHF (congestive heart failure) [454098]  Admitting Physician: Synetta Fail [[id number]]  Attending Physician: Synetta Fail [[id number]]          B Medical/Surgery History Past Medical History:  Diagnosis Date   AKI (acute kidney injury)    Atrial fibrillation    BREAST BIOPSY, HX OF 11/27/2006   CHF (congestive heart failure)    COLONIC POLYPS, HX OF 11/27/2006   HEMORRHOIDS 01/17/2010   HYPERTENSION 11/27/2006   Hypertensive retinopathy    OU   HYPOTHYROIDISM 11/27/2006   Macular degeneration    Dry OU   MENOPAUSAL SYNDROME  11/27/2006   Vascular disease    Past Surgical History:  Procedure Laterality Date   ABDOMINAL HYSTERECTOMY     APPENDECTOMY     BIOPSY  09/26/2022   Procedure: BIOPSY;  Surgeon: Lemar Lofty., MD;  Location: [facility] ENDOSCOPY;  Service: Gastroenterology;;   BREAST EXCISIONAL BIOPSY Right 1987   BREAST SURGERY     bx   CARDIOVERSION N/A 04/16/2022   Procedure: CARDIOVERSION;  Surgeon: Parke Poisson, MD;  Location: [facility] ENDOSCOPY;  Service: Cardiovascular;  Laterality: N/A;   CATARACT EXTRACTION Bilateral    COLONOSCOPY WITH PROPOFOL N/A 09/27/2022   Procedure: COLONOSCOPY WITH PROPOFOL;  Surgeon: Lemar Lofty., MD;  Location: [facility] ENDOSCOPY;  Service: Gastroenterology;  Laterality: N/A;   ESOPHAGEAL BRUSHING  09/26/2022   Procedure: ESOPHAGEAL BRUSHING;  Surgeon: Meridee Score Netty Starring., MD;  Location: [facility] ENDOSCOPY;  Service: Gastroenterology;;   ESOPHAGOGASTRODUODENOSCOPY N/A 09/26/2022   Procedure: ESOPHAGOGASTRODUODENOSCOPY (EGD);  Surgeon: Lemar Lofty., MD;  Location: [facility] ENDOSCOPY;  Service: Gastroenterology;  Laterality: N/A;   EYE SURGERY Bilateral    Cat Sx OU   FLEXIBLE SIGMOIDOSCOPY N/A 09/26/2022   Procedure: FLEXIBLE SIGMOIDOSCOPY;  Surgeon: Meridee Score Netty Starring., MD;  Location: [facility] ENDOSCOPY;  Service: Gastroenterology;  Laterality: N/A;   GIVENS CAPSULE STUDY N/A 09/27/2022   Procedure: GIVENS CAPSULE STUDY;  Surgeon: Lemar Lofty., MD;  Location: [facility] ENDOSCOPY;  Service: Gastroenterology;  Laterality: N/A;   HEMORRHOID BANDING     HEMOSTASIS CLIP PLACEMENT  09/27/2022   Procedure: HEMOSTASIS CLIP PLACEMENT;  Surgeon:  Mansouraty, Netty Starring., MD;  Location: [facility] ENDOSCOPY;  Service: Gastroenterology;;   HOT HEMOSTASIS N/A 09/26/2022   Procedure: HOT HEMOSTASIS (ARGON PLASMA COAGULATION/BICAP);  Surgeon: Lemar Lofty., MD;  Location: [facility] ENDOSCOPY;  Service: Gastroenterology;  Laterality: N/A;   HOT HEMOSTASIS N/A 09/27/2022   Procedure:  HOT HEMOSTASIS (ARGON PLASMA COAGULATION/BICAP);  Surgeon: Lemar Lofty., MD;  Location: [facility] ENDOSCOPY;  Service: Gastroenterology;  Laterality: N/A;   TONSILLECTOMY       A IV Location/Drains/Wounds Patient Lines/Drains/Airways Status     Active Line/Drains/Airways     Name Placement date Placement time Site Days   Peripheral IV 10/28/2022 20 G Left Forearm 10/20/2022  1209  Forearm  less than 1   External Urinary Catheter 10/17/2022  1421  --  less than 1   Wound / Incision (Open or Dehisced) 09/25/22 Non-pressure wound Abdomen Medial;Upper pink raw area noted on mid upper abdomen 09/25/22  1700  Abdomen  32   Wound / Incision (Open or Dehisced) Pretibial Distal;Right --  --  Pretibial  --   Wound / Incision (Open or Dehisced) 09/25/22 Pretibial Distal;Left 09/25/22  1700  Pretibial  32   Wound / Incision (Open or Dehisced) 11/11/2022 Coccyx Medial;Left 10/30/2022  1341  Coccyx  less than 1            Intake/Output Last 24 hours No intake or output data in the 24 hours ending 10/14/2022 1808  Labs/Imaging Results for orders placed or performed during the hospital encounter of 10/16/2022 (from the past 48 hour(s))  Brain natriuretic peptide     Status: Abnormal   Collection Time: 10/22/2022 12:40 PM  Result Value Ref Range   B Natriuretic Peptide 419.7 (H) 0.0 - 100.0 pg/mL    Comment: Performed at [facility], [address], [place], [place] [address]  Comprehensive metabolic panel     Status: Abnormal   Collection Time: 10/28/2022 12:40 PM  Result Value Ref Range   Sodium 136 135 - 145 mmol/L   Potassium 3.8 3.5 - 5.1 mmol/L   Chloride 99 98 - 111 mmol/L   CO2 27 22 - 32 mmol/L   Glucose, Bld 100 (H) 70 - 99 mg/dL    Comment: Glucose reference range applies only to samples taken after fasting for at least 8 hours.   BUN 20 8 - 23 mg/dL   Creatinine, Ser 0.98 (H) 0.44 - 1.00 mg/dL   Calcium 8.3 (L) 8.9 - 10.3 mg/dL   Total Protein 5.2 (L) 6.5 - 8.1 g/dL   Albumin 2.6  (L) 3.5 - 5.0 g/dL   AST 23 15 - 41 U/L   ALT 30 0 - 44 U/L   Alkaline Phosphatase 56 38 - 126 U/L   Total Bilirubin 0.8 0.3 - 1.2 mg/dL   GFR, Estimated 51 (L) >60 mL/min    Comment: (NOTE) Calculated using the CKD-EPI Creatinine Equation (2021)    Anion gap 10 5 - 15    Comment: Performed at [facility], [address] [place], [place] [address]  CBC     Status: Abnormal   Collection Time: 11/01/2022 12:40 PM  Result Value Ref Range   WBC 6.9 4.0 - 10.5 K/uL   RBC 2.97 (L) 3.87 - 5.11 MIL/uL   Hemoglobin 8.9 (L) 12.0 - 15.0 g/dL   HCT 78.2 (L) 95.6 - 21.3 %   MCV 103.0 (H) 80.0 - 100.0 fL   MCH 30.0 26.0 - 34.0 pg   MCHC  29.1 (L) 30.0 - 36.0 g/dL   RDW 58.5 (H) 92.9 - 24.4 %   Platelets 227 150 - 400 K/uL    Comment: REPEATED TO VERIFY   nRBC 0.0 0.0 - 0.2 %    Comment: Performed at [facility], [address] [place], [place] [address]  TSH     Status: None   Collection Time: 10/23/2022 12:40 PM  Result Value Ref Range   TSH 3.315 0.350 - 4.500 uIU/mL    Comment: Performed by a 3rd Generation assay with a functional sensitivity of <=0.01 uIU/mL. Performed at [facility], [address] [place], [place] [address]    DG Chest Portable 1 View  Result Date: 10/20/2022 CLINICAL DATA:  Shortness of breath and fluid retention. EXAM: PORTABLE CHEST 1 VIEW COMPARISON:  Chest x-ray dated September 25, 2022. FINDINGS: The patient is rotated to the right. Unchanged cardiomegaly. New small bilateral pleural effusions. Asymmetric opacity in the right lung. No pneumothorax. No acute osseous abnormality. IMPRESSION: 1. New small bilateral pleural effusions. 2. Asymmetric opacity in the right lung could reflect asymmetric edema or atelectasis. Electronically Signed   By: Obie Dredge M.D.   On: 10/24/2022 13:44    Pending Labs Unresulted Labs (From admission, onward)     Start     Ordered   10/28/22 0500  Basic metabolic panel  Tomorrow morning,   R        10/26/2022  1629   10/28/22 0500  CBC  Tomorrow morning,   R        10/26/2022 1629   11/11/2022 1628  Magnesium  Once,   R        10/16/2022 1629            Vitals/Pain Today's Vitals   10/15/2022 1530 10/30/2022 1545 11/11/2022 1600 10/19/2022 1644  BP: 121/63 134/76 122/81   Pulse: 67 71 73   Resp: 20 20 19    Temp:    98.2 F (36.8 C)  TempSrc:    Oral  SpO2: 99% 96% 98%   PainSc:        Isolation Precautions No active isolations  Medications Medications  amiodarone (PACERONE) tablet 200 mg (has no administration in time range)  metoprolol succinate (TOPROL-XL) 24 hr tablet 25 mg (has no administration in time range)  levothyroxine (SYNTHROID) tablet 88 mcg (has no administration in time range)  pantoprazole (PROTONIX) EC tablet 40 mg (has no administration in time range)  Rivaroxaban (XARELTO) tablet 15 mg (has no administration in time range)  melatonin tablet 5 mg (has no administration in time range)  sodium chloride flush (NS) 0.9 % injection 3 mL (has no administration in time range)  acetaminophen (TYLENOL) tablet 650 mg (has no administration in time range)    Or  acetaminophen (TYLENOL) suppository 650 mg (has no administration in time range)  polyethylene glycol (MIRALAX / GLYCOLAX) packet 17 g (has no administration in time range)  furosemide (LASIX) injection 60 mg (has no administration in time range)  furosemide (LASIX) injection 40 mg (40 mg Intravenous Given 10/23/2022 1502)    Mobility walks with device     Focused Assessments Cardiac Assessment Handoff:    No results found for: "CKTOTAL", "CKMB", "CKMBINDEX", "TROPONINI" No results found for: "DDIMER" Does the Patient currently have chest pain? No    R Recommendations: See Admitting Provider Note  Report given to:   Additional Notes: .

## 2022-10-27 NOTE — ED Triage Notes (Signed)
Pt bib ems coming from [facility]. Pt arrived to facility 3 weeks ago after significant GI bleed with blood transfusions. Staff reports progressing fluid retention (leg and abdominal edema) and SOB x couple weeks. Lasix was recently started back/increased with no improvement. Pt alert and oriented in triage.   EMS vitals: 94% 2L [place] (on oxygen at baseline) BP 140/72 RR 32 HR 60-90 a fib Cbg 135

## 2022-10-27 NOTE — ED Notes (Addendum)
ED TO INPATIENT HANDOFF REPORT  ED Nurse Name and Phone #: Collene Mares Name/Age/Gender Janice Small 89 y.o. female Room/Bed: 010C/010C  Code Status   Code Status: Full Code  Home/SNF/Other rehab Patient oriented to: self, place, time, and situation Is this baseline? Yes   Triage Complete: Triage complete  Chief Complaint Acute on chronic diastolic CHF (congestive heart failure) [I50.33]  Triage Note Pt bib ems coming from [facility] re[facility]. Pt arrived to facility 3 weeks ago after significant GI bleed with blood transfusions. Staff reports progressing fluid retention (leg and abdominal edema) and SOB x couple weeks. Lasix was recently started back/increased with no improvement. Pt alert and oriented in triage.   EMS vitals: 94% 2L [place] (on oxygen at baseline) BP 140/72 RR 32 HR 60-90 a fib Cbg 135   Allergies Allergies  Allergen Reactions   Codeine Phosphate Nausea And Vomiting    Level of Care/Admitting Diagnosis ED Disposition     ED Disposition  Admit   Condition  --   Comment  Hospital Area: [facility] [[id number]]  Level of Care: Telemetry Cardiac [103]  May place patient in observation at [facility] or [facility] if equivalent level of care is available:: No  Covid Evaluation: Asymptomatic - no recent exposure (last 10 days) testing not required  Diagnosis: Acute on chronic diastolic CHF (congestive heart failure) [454098]  Admitting Physician: Synetta Fail [[id number]]  Attending Physician: Synetta Fail [[id number]]          B Medical/Surgery History Past Medical History:  Diagnosis Date   AKI (acute kidney injury)    Atrial fibrillation    BREAST BIOPSY, HX OF 11/27/2006   CHF (congestive heart failure)    COLONIC POLYPS, HX OF 11/27/2006   HEMORRHOIDS 01/17/2010   HYPERTENSION 11/27/2006   Hypertensive retinopathy    OU   HYPOTHYROIDISM 11/27/2006   Macular degeneration    Dry OU   MENOPAUSAL SYNDROME 11/27/2006    Vascular disease    Past Surgical History:  Procedure Laterality Date   ABDOMINAL HYSTERECTOMY     APPENDECTOMY     BIOPSY  09/26/2022   Procedure: BIOPSY;  Surgeon: Lemar Lofty., MD;  Location: [facility] ENDOSCOPY;  Service: Gastroenterology;;   BREAST EXCISIONAL BIOPSY Right 1987   BREAST SURGERY     bx   CARDIOVERSION N/A 04/16/2022   Procedure: CARDIOVERSION;  Surgeon: Parke Poisson, MD;  Location: [facility] ENDOSCOPY;  Service: Cardiovascular;  Laterality: N/A;   CATARACT EXTRACTION Bilateral    COLONOSCOPY WITH PROPOFOL N/A 09/27/2022   Procedure: COLONOSCOPY WITH PROPOFOL;  Surgeon: Lemar Lofty., MD;  Location: [facility] ENDOSCOPY;  Service: Gastroenterology;  Laterality: N/A;   ESOPHAGEAL BRUSHING  09/26/2022   Procedure: ESOPHAGEAL BRUSHING;  Surgeon: Meridee Score Netty Starring., MD;  Location: [facility] ENDOSCOPY;  Service: Gastroenterology;;   ESOPHAGOGASTRODUODENOSCOPY N/A 09/26/2022   Procedure: ESOPHAGOGASTRODUODENOSCOPY (EGD);  Surgeon: Lemar Lofty., MD;  Location: [facility] ENDOSCOPY;  Service: Gastroenterology;  Laterality: N/A;   EYE SURGERY Bilateral    Cat Sx OU   FLEXIBLE SIGMOIDOSCOPY N/A 09/26/2022   Procedure: FLEXIBLE SIGMOIDOSCOPY;  Surgeon: Meridee Score Netty Starring., MD;  Location: [facility] ENDOSCOPY;  Service: Gastroenterology;  Laterality: N/A;   GIVENS CAPSULE STUDY N/A 09/27/2022   Procedure: GIVENS CAPSULE STUDY;  Surgeon: Lemar Lofty., MD;  Location: [facility] ENDOSCOPY;  Service: Gastroenterology;  Laterality: N/A;   HEMORRHOID BANDING     HEMOSTASIS CLIP PLACEMENT  09/27/2022   Procedure: HEMOSTASIS CLIP PLACEMENT;  Surgeon: Corliss Parish  Montez Hageman., MD;  Location: [facility] ENDOSCOPY;  Service: Gastroenterology;;   HOT HEMOSTASIS N/A 09/26/2022   Procedure: HOT HEMOSTASIS (ARGON PLASMA COAGULATION/BICAP);  Surgeon: Lemar Lofty., MD;  Location: [facility] ENDOSCOPY;  Service: Gastroenterology;  Laterality: N/A;   HOT HEMOSTASIS N/A 09/27/2022   Procedure: HOT HEMOSTASIS  (ARGON PLASMA COAGULATION/BICAP);  Surgeon: Lemar Lofty., MD;  Location: [facility] ENDOSCOPY;  Service: Gastroenterology;  Laterality: N/A;   TONSILLECTOMY       A IV Location/Drains/Wounds Patient Lines/Drains/Airways Status     Active Line/Drains/Airways     Name Placement date Placement time Site Days   Peripheral IV 10/19/2022 20 G Left Forearm 11/03/2022  1209  Forearm  less than 1   External Urinary Catheter 11/01/2022  1421  --  less than 1   Wound / Incision (Open or Dehisced) 09/25/22 Non-pressure wound Abdomen Medial;Upper pink raw area noted on mid upper abdomen 09/25/22  1700  Abdomen  32   Wound / Incision (Open or Dehisced) Pretibial Distal;Right --  --  Pretibial  --   Wound / Incision (Open or Dehisced) 09/25/22 Pretibial Distal;Left 09/25/22  1700  Pretibial  32   Wound / Incision (Open or Dehisced) 11/02/2022 Coccyx Medial;Left 11/01/2022  1341  Coccyx  less than 1            Intake/Output Last 24 hours No intake or output data in the 24 hours ending 10/23/2022 1633  Labs/Imaging Results for orders placed or performed during the hospital encounter of 11/10/2022 (from the past 48 hour(s))  Brain natriuretic peptide     Status: Abnormal   Collection Time: 10/21/2022 12:40 PM  Result Value Ref Range   B Natriuretic Peptide 419.7 (H) 0.0 - 100.0 pg/mL    Comment: Performed at [facility], [address], [place], [place] [address]  Comprehensive metabolic panel     Status: Abnormal   Collection Time: 10/13/2022 12:40 PM  Result Value Ref Range   Sodium 136 135 - 145 mmol/L   Potassium 3.8 3.5 - 5.1 mmol/L   Chloride 99 98 - 111 mmol/L   CO2 27 22 - 32 mmol/L   Glucose, Bld 100 (H) 70 - 99 mg/dL    Comment: Glucose reference range applies only to samples taken after fasting for at least 8 hours.   BUN 20 8 - 23 mg/dL   Creatinine, Ser 0.05 (H) 0.44 - 1.00 mg/dL   Calcium 8.3 (L) 8.9 - 10.3 mg/dL   Total Protein 5.2 (L) 6.5 - 8.1 g/dL   Albumin 2.6 (L) 3.5 - 5.0  g/dL   AST 23 15 - 41 U/L   ALT 30 0 - 44 U/L   Alkaline Phosphatase 56 38 - 126 U/L   Total Bilirubin 0.8 0.3 - 1.2 mg/dL   GFR, Estimated 51 (L) >60 mL/min    Comment: (NOTE) Calculated using the CKD-EPI Creatinine Equation (2021)    Anion gap 10 5 - 15    Comment: Performed at [facility], [address] [place], [place] [address]  CBC     Status: Abnormal   Collection Time: 11/08/2022 12:40 PM  Result Value Ref Range   WBC 6.9 4.0 - 10.5 K/uL   RBC 2.97 (L) 3.87 - 5.11 MIL/uL   Hemoglobin 8.9 (L) 12.0 - 15.0 g/dL   HCT 11.7 (L) 35.6 - 70.1 %   MCV 103.0 (H) 80.0 - 100.0 fL   MCH 30.0 26.0 - 34.0 pg   MCHC 29.1 (L)  30.0 - 36.0 g/dL   RDW 11.9 (H) 14.7 - 82.9 %   Platelets 227 150 - 400 K/uL    Comment: REPEATED TO VERIFY   nRBC 0.0 0.0 - 0.2 %    Comment: Performed at [facility], [address], [place], [place] [address]  TSH     Status: None   Collection Time: 10/15/2022 12:40 PM  Result Value Ref Range   TSH 3.315 0.350 - 4.500 uIU/mL    Comment: Performed by a 3rd Generation assay with a functional sensitivity of <=0.01 uIU/mL. Performed at [facility], [address] [place], [place] [address]    DG Chest Portable 1 View  Result Date: 10/29/2022 CLINICAL DATA:  Shortness of breath and fluid retention. EXAM: PORTABLE CHEST 1 VIEW COMPARISON:  Chest x-ray dated September 25, 2022. FINDINGS: The patient is rotated to the right. Unchanged cardiomegaly. New small bilateral pleural effusions. Asymmetric opacity in the right lung. No pneumothorax. No acute osseous abnormality. IMPRESSION: 1. New small bilateral pleural effusions. 2. Asymmetric opacity in the right lung could reflect asymmetric edema or atelectasis. Electronically Signed   By: Obie Dredge M.D.   On: 11/09/2022 13:44    Pending Labs Unresulted Labs (From admission, onward)     Start     Ordered   10/28/22 0500  Basic metabolic panel  Tomorrow morning,   R        11/07/2022 1629   10/28/22  0500  CBC  Tomorrow morning,   R        11/07/2022 1629   10/25/2022 1628  Magnesium  Once,   R        11/04/2022 1629            Vitals/Pain Today's Vitals   11/10/2022 1230 10/16/2022 1530 11/05/2022 1545 11/02/2022 1600  BP: 119/78 121/63 134/76 122/81  Pulse: 81 67 71 73  Resp: Temp:      TempSrc:      SpO2: 97% 99% 96% 98%  PainSc:        Isolation Precautions No active isolations  Medications Medications  amiodarone (PACERONE) tablet 200 mg (has no administration in time range)  metoprolol succinate (TOPROL-XL) 24 hr tablet 25 mg (has no administration in time range)  levothyroxine (SYNTHROID) tablet 88 mcg (has no administration in time range)  pantoprazole (PROTONIX) EC tablet 40 mg (has no administration in time range)  Rivaroxaban (XARELTO) tablet 15 mg (has no administration in time range)  melatonin tablet 5 mg (has no administration in time range)  sodium chloride flush (NS) 0.9 % injection 3 mL (has no administration in time range)  acetaminophen (TYLENOL) tablet 650 mg (has no administration in time range)    Or  acetaminophen (TYLENOL) suppository 650 mg (has no administration in time range)  polyethylene glycol (MIRALAX / GLYCOLAX) packet 17 g (has no administration in time range)  furosemide (LASIX) injection 60 mg (has no administration in time range)  furosemide (LASIX) injection 40 mg (40 mg Intravenous Given 10/30/2022 1502)    Mobility walker     Focused Assessments    R Recommendations: See Admitting Provider Note  Report given to:   Additional Notes: sacral wound present on arrival

## 2022-10-27 NOTE — H&P (Signed)
History and Physical   Janice Small FUX:[id number] DOB: 1928-02-20 DOA: 11/11/2022  PCP: de [place], Raymond J, MD   Patient coming from: [facility] rehab  Chief Complaint: Shortness of breath  HPI: Janice Small is a 89 y.o. female with medical history significant of diastolic CHF, CKD 3B, atrial fibrillation, hypertension, hypothyroidism, GI bleed presenting with worsening shortness of breath.  Patient has reportedly had worsening fatigue and shortness of breath at her rehab facility where she has been following an admission for GI bleed.  She is also noted to have increased edema as well as some orthopnea.  They have been increasing her Lasix dose at the facility without improvement.  No recurrent evidence of GI bleed, dark or bloody stools.  Family does report productive cough.  She denies fevers, chills, chest pain, abdominal pain, constipation, diarrhea, nausea, vomiting.  Has been on 2L supplemental oxygen for the last 2 weeks or so.  ED Course:  vital signs in the ED stable other than requiring 2 L supplemental oxygen.  Lab workup included CMP with creatinine 1.83 which is at baseline, calcium 8.3, protein 5.2, albumin 2.6.  CBC with hemoglobin stable at 8.9.  TSH normal.  BNP elevated to 419.  Chest x-ray with small bilateral effusions which are new and asymmetric edema versus atelectasis.  Patient received 40 mg IV Lasix in the ED.  Review of Systems: As per HPI otherwise all other systems reviewed and are negative.  Past Medical History:  Diagnosis Date   AKI (acute kidney injury)    Atrial fibrillation    BREAST BIOPSY, HX OF 11/27/2006   CHF (congestive heart failure)    COLONIC POLYPS, HX OF 11/27/2006   HEMORRHOIDS 01/17/2010   HYPERTENSION 11/27/2006   Hypertensive retinopathy    OU   HYPOTHYROIDISM 11/27/2006   Macular degeneration    Dry OU   MENOPAUSAL SYNDROME 11/27/2006   Vascular disease     Past Surgical History:  Procedure Laterality Date   ABDOMINAL  HYSTERECTOMY     APPENDECTOMY     BIOPSY  09/26/2022   Procedure: BIOPSY;  Surgeon: Lemar Lofty., MD;  Location: [facility] ENDOSCOPY;  Service: Gastroenterology;;   BREAST EXCISIONAL BIOPSY Right 1987   BREAST SURGERY     bx   CARDIOVERSION N/A 04/16/2022   Procedure: CARDIOVERSION;  Surgeon: Parke Poisson, MD;  Location: [facility] ENDOSCOPY;  Service: Cardiovascular;  Laterality: N/A;   CATARACT EXTRACTION Bilateral    COLONOSCOPY WITH PROPOFOL N/A 09/27/2022   Procedure: COLONOSCOPY WITH PROPOFOL;  Surgeon: Lemar Lofty., MD;  Location: [facility] ENDOSCOPY;  Service: Gastroenterology;  Laterality: N/A;   ESOPHAGEAL BRUSHING  09/26/2022   Procedure: ESOPHAGEAL BRUSHING;  Surgeon: Meridee Score Netty Starring., MD;  Location: [facility] ENDOSCOPY;  Service: Gastroenterology;;   ESOPHAGOGASTRODUODENOSCOPY N/A 09/26/2022   Procedure: ESOPHAGOGASTRODUODENOSCOPY (EGD);  Surgeon: Lemar Lofty., MD;  Location: [facility] ENDOSCOPY;  Service: Gastroenterology;  Laterality: N/A;   EYE SURGERY Bilateral    Cat Sx OU   FLEXIBLE SIGMOIDOSCOPY N/A 09/26/2022   Procedure: FLEXIBLE SIGMOIDOSCOPY;  Surgeon: Meridee Score Netty Starring., MD;  Location: [facility] ENDOSCOPY;  Service: Gastroenterology;  Laterality: N/A;   GIVENS CAPSULE STUDY N/A 09/27/2022   Procedure: GIVENS CAPSULE STUDY;  Surgeon: Lemar Lofty., MD;  Location: [facility] ENDOSCOPY;  Service: Gastroenterology;  Laterality: N/A;   HEMORRHOID BANDING     HEMOSTASIS CLIP PLACEMENT  09/27/2022   Procedure: HEMOSTASIS CLIP PLACEMENT;  Surgeon: Lemar Lofty., MD;  Location: [facility] ENDOSCOPY;  Service: Gastroenterology;;   HOT HEMOSTASIS  N/A 09/26/2022   Procedure: HOT HEMOSTASIS (ARGON PLASMA COAGULATION/BICAP);  Surgeon: Lemar Lofty., MD;  Location: [facility] ENDOSCOPY;  Service: Gastroenterology;  Laterality: N/A;   HOT HEMOSTASIS N/A 09/27/2022   Procedure: HOT HEMOSTASIS (ARGON PLASMA COAGULATION/BICAP);  Surgeon: Lemar Lofty., MD;  Location:  [facility] ENDOSCOPY;  Service: Gastroenterology;  Laterality: N/A;   TONSILLECTOMY      Social History  reports that she has never smoked. She has never used smokeless tobacco. She reports that she does not drink alcohol and does not use drugs.  Allergies  Allergen Reactions   Codeine Phosphate Nausea And Vomiting    Family History  Problem Relation Age of Onset   Heart disease Father    Colon cancer Neg Hx    Esophageal cancer Neg Hx    Pancreatic cancer Neg Hx    Liver cancer Neg Hx    Stomach cancer Neg Hx   Reviewed on admission  Prior to Admission medications   Medication Sig Start Date End Date Taking? Authorizing Provider  acetaminophen (TYLENOL) 650 MG CR tablet Take 650 mg by mouth daily.   Yes [provider]  amiodarone (PACERONE) 200 MG tablet Twice daily until 3/26-then switch to once daily dosing Patient taking differently: Take 200 mg by mouth daily. 10/01/22  Yes Ghimire, Werner Lean, MD  benzonatate (TESSALON) 200 MG capsule Take 200 mg by mouth 2 (two) times daily.   Yes [provider]  Cholecalciferol (VITAMIN D3) 50 MCG (2000 UT) TABS Take 2,000 Units by mouth daily.   Yes [provider]  CLOBETASOL PROPIONATE E 0.05 % emollient cream Apply 1 Application topically 2 (two) times daily. 04/15/22  Yes [provider]  dapagliflozin propanediol (FARXIGA) 10 MG TABS tablet Take 1 tablet (10 mg total) by mouth daily. 07/31/22  Yes Little Ishikawa, MD  docusate sodium (COLACE) 100 MG capsule Take 200 mg by mouth at bedtime.   Yes [provider]  ferrous gluconate (FERGON) 324 MG tablet Take 324 mg by mouth daily.   Yes [provider]  furosemide (LASIX) 40 MG tablet Take 0.5 tablets (20 mg total) by mouth daily. Patient taking differently: Take 60 mg by mouth daily. 10/01/22  Yes Ghimire, Werner Lean, MD  guaiFENesin (MUCINEX) 600 MG 12 hr tablet Take 600 mg by mouth 2 (two) times daily.   Yes [provider]   hydrOXYzine (ATARAX) 10 MG tablet Take 10 mg by mouth 3 (three) times daily.   Yes [provider]  levothyroxine (SYNTHROID) 88 MCG tablet Take 88 mcg by mouth daily before breakfast.   Yes [provider]  melatonin 5 MG TABS Take 5 mg by mouth at bedtime.   Yes [provider]  metoprolol succinate (TOPROL-XL) 25 MG 24 hr tablet Take 1 tablet (25 mg total) by mouth daily. 07/31/22  Yes Little Ishikawa, MD  pantoprazole (PROTONIX) 40 MG tablet Take 1 tablet (40 mg total) by mouth 2 (two) times daily. 10/01/22  Yes Ghimire, Werner Lean, MD  potassium chloride (KLOR-CON M) 10 MEQ tablet Take 1 tablet (10 mEq total) by mouth daily. 07/31/22  Yes Little Ishikawa, MD  Rivaroxaban (XARELTO) 15 MG TABS tablet Take 15 mg by mouth daily.   Yes [provider]  sucralfate (CARAFATE) 1 g tablet Take 1 tablet (1 g total) by mouth 2 (two) times daily for 14 days. Patient not taking: Reported on 10/20/2022 10/01/22 10/15/22  Maretta Bees, MD  tamsulosin (FLOMAX) 0.4 MG  CAPS capsule Take 1 capsule (0.4 mg total) by mouth daily. Patient not taking: Reported on 10/16/2022 10/01/22   Maretta Bees, MD    Physical Exam: Vitals:   10/19/2022 1230 10/22/2022 1530 11/05/2022 1545 11/11/2022 1600  BP: 119/78 121/63 134/76 122/81  Pulse: 81 67 71 73  Resp: Temp:      TempSrc:      SpO2: 97% 99% 96% 98%    Physical Exam Constitutional:      General: She is not in acute distress.    Appearance: Normal appearance.  HENT:     Head: Normocephalic and atraumatic.     Mouth/Throat:     Mouth: Mucous membranes are moist.     Pharynx: Oropharynx is clear.  Eyes:     Extraocular Movements: Extraocular movements intact.     Pupils: Pupils are equal, round, and reactive to light.  Cardiovascular:     Rate and Rhythm: Normal rate and regular rhythm.     Pulses: Normal pulses.     Heart sounds: Normal heart sounds.  Pulmonary:     Effort: Pulmonary  effort is normal. No respiratory distress.     Breath sounds: Rales present.  Abdominal:     General: Bowel sounds are normal. There is no distension.     Palpations: Abdomen is soft.     Tenderness: There is no abdominal tenderness.  Musculoskeletal:        General: No swelling or deformity.     Right lower leg: Edema present.     Left lower leg: Edema present.  Skin:    General: Skin is warm and dry.  Neurological:     General: No focal deficit present.     Mental Status: Mental status is at baseline.    Labs on Admission: I have personally reviewed following labs and imaging studies  CBC: Recent Labs  Lab 10/21/2022 1240  WBC 6.9  HGB 8.9*  HCT 30.6*  MCV 103.0*  PLT 227    Basic Metabolic Panel: Recent Labs  Lab 10/28/2022 1240  NA 136  K 3.8  CL 99  CO2 27  GLUCOSE 100*  BUN 20  CREATININE 1.03*  CALCIUM 8.3*    GFR: CrCl cannot be calculated (Unknown ideal weight.).  Liver Function Tests: Recent Labs  Lab 11/05/2022 1240  AST 23  ALT 30  ALKPHOS 56  BILITOT 0.8  PROT 5.2*  ALBUMIN 2.6*    Urine analysis:    Component Value Date/Time   COLORURINE YELLOW 09/25/2022 0650   APPEARANCEUR CLEAR 09/25/2022 0650   LABSPEC 1.013 09/25/2022 0650   PHURINE 5.0 09/25/2022 0650   GLUCOSEU 50 (A) 09/25/2022 0650   HGBUR LARGE (A) 09/25/2022 0650   BILIRUBINUR NEGATIVE 09/25/2022 0650   BILIRUBINUR n 01/01/2015 1622   KETONESUR NEGATIVE 09/25/2022 0650   PROTEINUR NEGATIVE 09/25/2022 0650   UROBILINOGEN 0.2 03/21/2019 1456   NITRITE NEGATIVE 09/25/2022 0650   LEUKOCYTESUR TRACE (A) 09/25/2022 0650    Radiological Exams on Admission: DG Chest Portable 1 View  Result Date: 10/30/2022 CLINICAL DATA:  Shortness of breath and fluid retention. EXAM: PORTABLE CHEST 1 VIEW COMPARISON:  Chest x-ray dated September 25, 2022. FINDINGS: The patient is rotated to the right. Unchanged cardiomegaly. New small bilateral pleural effusions. Asymmetric opacity in the right  lung. No pneumothorax. No acute osseous abnormality. IMPRESSION: 1. New small bilateral pleural effusions. 2. Asymmetric opacity in the right lung could reflect asymmetric edema or atelectasis. Electronically Signed  By: Obie Dredge M.D.   On: 2022/11/11 13:44    EKG: Independently reviewed.  Atrial fibrillation 89 bpm.  Nonspecific T wave flattening.  Low voltage multiple leads.  Assessment/Plan Principal Problem:   Acute on chronic diastolic CHF (congestive heart failure) Active Problems:   Acute renal failure superimposed on stage 3b chronic kidney disease   Atrial fibrillation   Essential hypertension   Hypothyroidism   Chronic diastolic CHF > Patient with worsening fatigue, shortness of breath, edema at her rehab facility.  Also reporting orthopnea.  Not responding to p.o. Lasix despite increased dose. > Has had some cough, currently no leukocytosis if this persist despite diuresis may need to investigate further. > BNP elevated to 419 in the ED.  Creatinine stable.  Remains on 2 L. > Received 40 mg IV Lasix in ED. - Monitor on telemetry - Continue with Lasix 60 mg IV twice daily - Trend renal function and electrolytes - Check magnesium - Strict I's and O's Daily weights - Echocardiogram  CKD 3B > Creatinine stable - Trend renal function and electrolytes  Anemia History of GI bleed > Hemoglobin stable - Trend CBC  Atrial fibrillation - Continue home amiodarone, metoprolol, Xarelto  Hypertension - Continue home metoprolol  Hypothyroidism - Continue home Synthroid   DVT prophylaxis: Xarelto Code Status:   Full Family Communication:  Updated at bedside Disposition Plan:   Patient is from:  [facility] rehab  Anticipated DC to:  As above  Anticipated DC date:  1 to 3 days  Anticipated DC barriers: Family wishes to change rehab facilities.  Consults called:  None Admission status:  Observation, telemetry  Severity of Illness: The appropriate patient status for  this patient is OBSERVATION. Observation status is judged to be reasonable and necessary in order to provide the required intensity of service to ensure the patient's safety. The patient's presenting symptoms, physical exam findings, and initial radiographic and laboratory data in the context of their medical condition is felt to place them at decreased risk for further clinical deterioration. Furthermore, it is anticipated that the patient will be medically stable for discharge from the hospital within 2 midnights of admission.    Synetta Fail MD [facility]  How to contact the [facility] Attending or Consulting provider 7A - 7P or covering provider during after hours 7P -7A, for this patient?   Check the care team in [facility] and look for a) attending/consulting [facility] provider listed and b) the [facility] team listed Log into [email or web address] and use [place]'s universal password to access. If you do not have the password, please contact the hospital operator. Locate the [facility] provider you are looking for under [facility] and page to a number that you can be directly reached. If you still have difficulty reaching the provider, please page the [facility] (Director on Call) for the Hospitalists listed on amion for assistance.  2022/11/11, 4:30 PM

## 2022-10-27 NOTE — ED Provider Notes (Signed)
[place] EMERGENCY DEPARTMENT AT [facility] [place] Provider Note   CSN: [id number] Arrival date & time: 11/03/2022  1137     History  Chief Complaint  Patient presents with   Shortness of Breath    Janice Small is a 89 y.o. female.   Shortness of Breath Patient was send shortness of breath and cough.  Comes from [facility] re[facility].  Reportedly there after GI bleed.  More short of breath more fatigue.  More edema.  Reportedly has had Lasix without relief.  Patient states she does not sleep flat but cannot sleep flat normally..  States she has oxygen that she uses as needed but has been had to use all the time.  Has swelling in her legs.  No further GI bleeding that she has seen.  Has had a cough with occasional mild sputum production.    Past Medical History:  Diagnosis Date   AKI (acute kidney injury)    Atrial fibrillation    BREAST BIOPSY, HX OF 11/27/2006   CHF (congestive heart failure)    COLONIC POLYPS, HX OF 11/27/2006   HEMORRHOIDS 01/17/2010   HYPERTENSION 11/27/2006   Hypertensive retinopathy    OU   HYPOTHYROIDISM 11/27/2006   Macular degeneration    Dry OU   MENOPAUSAL SYNDROME 11/27/2006   Vascular disease     Home Medications Prior to Admission medications   Medication Sig Start Date End Date Taking? Authorizing Provider  acetaminophen (TYLENOL) 650 MG CR tablet Take 650 mg by mouth daily.   Yes [provider]  amiodarone (PACERONE) 200 MG tablet Twice daily until 3/26-then switch to once daily dosing Patient taking differently: Take 200 mg by mouth daily. 10/01/22  Yes Ghimire, Werner Lean, MD  benzonatate (TESSALON) 200 MG capsule Take 200 mg by mouth 2 (two) times daily.   Yes [provider]  Cholecalciferol (VITAMIN D3) 50 MCG (2000 UT) TABS Take 2,000 Units by mouth daily.   Yes [provider]  CLOBETASOL PROPIONATE E 0.05 % emollient cream Apply 1 Application topically 2 (two) times daily. 04/15/22  Yes [provider]  dapagliflozin propanediol (FARXIGA) 10 MG TABS tablet Take 1 tablet (10 mg total) by mouth daily. 07/31/22  Yes Little Ishikawa, MD  docusate sodium (COLACE) 100 MG capsule Take 200 mg by mouth at bedtime.   Yes [provider]  ferrous gluconate (FERGON) 324 MG tablet Take 324 mg by mouth daily.   Yes [provider]  furosemide (LASIX) 40 MG tablet Take 0.5 tablets (20 mg total) by mouth daily. Patient taking differently: Take 60 mg by mouth daily. 10/01/22  Yes Ghimire, Werner Lean, MD  guaiFENesin (MUCINEX) 600 MG 12 hr tablet Take 600 mg by mouth 2 (two) times daily.   Yes [provider]  hydrOXYzine (ATARAX) 10 MG tablet Take 10 mg by mouth 3 (three) times daily.   Yes [provider]  levothyroxine (SYNTHROID) 88 MCG tablet Take 88 mcg by mouth daily before breakfast.   Yes [provider]  melatonin 5 MG TABS Take 5 mg by mouth at bedtime.   Yes [provider]  metoprolol succinate (TOPROL-XL) 25 MG 24 hr tablet Take 1 tablet (25 mg total) by mouth daily. 07/31/22  Yes Little Ishikawa, MD  pantoprazole (PROTONIX) 40 MG tablet Take 1 tablet (40 mg total) by mouth 2 (two) times daily. 10/01/22  Yes Ghimire, Werner Lean, MD  potassium chloride (KLOR-CON M) 10 MEQ tablet Take 1 tablet (10  mEq total) by mouth daily. 07/31/22  Yes Little Ishikawa, MD  Rivaroxaban (XARELTO) 15 MG TABS tablet Take 15 mg by mouth daily.   Yes [provider]  sucralfate (CARAFATE) 1 g tablet Take 1 tablet (1 g total) by mouth 2 (two) times daily for 14 days. Patient not taking: Reported on 11/07/2022 10/01/22 10/15/22  Maretta Bees, MD  SYNTHROID 75 MCG tablet TAKE 1 TABLET BY MOUTH DAILY Patient not taking: Reported on 10/24/2022 01/24/22   Early, Sung Amabile, NP  tamsulosin (FLOMAX) 0.4 MG CAPS capsule Take 1 capsule (0.4 mg total) by mouth daily. Patient not taking: Reported on 10/15/2022 10/01/22   Maretta Bees, MD       Allergies    Codeine phosphate    Review of Systems   Review of Systems  Respiratory:  Positive for shortness of breath.     Physical Exam Updated Vital Signs BP 119/78   Pulse 81   Temp 99 F (37.2 C) (Oral)   Resp 19   SpO2 97%  Physical Exam Vitals and nursing note reviewed.  HENT:     Head: Normocephalic.  Cardiovascular:     Rate and Rhythm: Normal rate. Rhythm irregular.  Chest:     Chest wall: No tenderness.     Comments: Diffuse harsh breath sounds. Abdominal:     Tenderness: There is no abdominal tenderness.  Musculoskeletal:     Right lower leg: Edema present.     Left lower leg: Edema present.     Comments: Moderate pitting edema bilateral lower extremities.  Neurological:     Mental Status: She is alert.     ED Results / Procedures / Treatments   Labs (all labs ordered are listed, but only abnormal results are displayed) Labs Reviewed  BRAIN NATRIURETIC PEPTIDE - Abnormal; Notable for the following components:      Result Value   B Natriuretic Peptide 419.7 (*)    All other components within normal limits  COMPREHENSIVE METABOLIC PANEL - Abnormal; Notable for the following components:   Glucose, Bld 100 (*)    Creatinine, Ser 1.03 (*)    Calcium 8.3 (*)    Total Protein 5.2 (*)    Albumin 2.6 (*)    GFR, Estimated 51 (*)    All other components within normal limits  CBC - Abnormal; Notable for the following components:   RBC 2.97 (*)    Hemoglobin 8.9 (*)    HCT 30.6 (*)    MCV 103.0 (*)    MCHC 29.1 (*)    RDW 18.7 (*)    All other components within normal limits  TSH    EKG EKG Interpretation  Date/Time:  Monday October 27 2022 11:58:00 EDT Ventricular Rate:  89 PR Interval:    QRS Duration: 88 QT Interval:  355 QTC Calculation: 432 R Axis:   113 Text Interpretation: Atrial fibrillation Right axis deviation Low voltage, precordial leads Borderline T abnormalities, diffuse leads Confirmed by Benjiman Core [id number]) on  10/29/2022 12:24:01 PM  Radiology DG Chest Portable 1 View  Result Date: 10/24/2022 CLINICAL DATA:  Shortness of breath and fluid retention. EXAM: PORTABLE CHEST 1 VIEW COMPARISON:  Chest x-ray dated September 25, 2022. FINDINGS: The patient is rotated to the right. Unchanged cardiomegaly. New small bilateral pleural effusions. Asymmetric opacity in the right lung. No pneumothorax. No acute osseous abnormality. IMPRESSION: 1. New small bilateral pleural effusions. 2. Asymmetric opacity in the right lung could reflect asymmetric edema or atelectasis.  Electronically Signed   By: Obie Dredge M.D.   On: 10/25/2022 13:44    Procedures Procedures    Medications Ordered in ED Medications  furosemide (LASIX) injection 40 mg (40 mg Intravenous Given 11/05/2022 1502)    ED Course/ Medical Decision Making/ A&P                             Medical Decision Making Amount and/or Complexity of Data Reviewed Labs: ordered. Radiology: ordered.  Risk Prescription drug management.   Patient is shortness of breath and cough.  Recent mission to the hospital and was transfused 4 units of blood.  Potentially could have volume overload from this.  Has been on Lasix but reportedly not really helping.  Does have edema on the legs.  Also harsh breath sounds that could be infectious versus volume overload.  Will get x-ray BNP and some basic blood work.  Reviewed recent discharge note.  BNP is elevated but appears towards level this plan.  X-ray shows some right-sided infiltrate, by radiologist read as atelectasis versus asymmetric edema.  Does have worsening edema in legs.  Patient's son is now here and provides more history.  They have been increasing Lasix at the nursing home without relief.  Patient's weight is only up a pound or 2 however.  Cannot do the same physical activity that she was before due to shortness of breath although this has been somewhat chronic since her recent admission in the hospital.  He also  states she has been coughing up green sputum.  With shortness of breath edema elevated BNP I think she would benefit from mission the hospital.  Has failed outpatient adjustment of Lasix.  Will discuss with hospitalist.          Final Clinical Impression(s) / ED Diagnoses Final diagnoses:  Congestive heart failure, unspecified HF chronicity, unspecified heart failure type    Rx / DC Orders ED Discharge Orders     None         Benjiman Core, MD 10/20/2022 1541

## 2022-10-28 ENCOUNTER — Observation Stay (HOSPITAL_COMMUNITY): Payer: Medicare Other

## 2022-10-28 DIAGNOSIS — I480 Paroxysmal atrial fibrillation: Secondary | ICD-10-CM | POA: Diagnosis present

## 2022-10-28 DIAGNOSIS — H35033 Hypertensive retinopathy, bilateral: Secondary | ICD-10-CM | POA: Diagnosis present

## 2022-10-28 DIAGNOSIS — J449 Chronic obstructive pulmonary disease, unspecified: Secondary | ICD-10-CM | POA: Diagnosis present

## 2022-10-28 DIAGNOSIS — J9621 Acute and chronic respiratory failure with hypoxia: Secondary | ICD-10-CM | POA: Diagnosis present

## 2022-10-28 DIAGNOSIS — J189 Pneumonia, unspecified organism: Secondary | ICD-10-CM | POA: Diagnosis not present

## 2022-10-28 DIAGNOSIS — Z66 Do not resuscitate: Secondary | ICD-10-CM | POA: Diagnosis present

## 2022-10-28 DIAGNOSIS — N1832 Chronic kidney disease, stage 3b: Secondary | ICD-10-CM | POA: Diagnosis present

## 2022-10-28 DIAGNOSIS — E440 Moderate protein-calorie malnutrition: Secondary | ICD-10-CM | POA: Diagnosis present

## 2022-10-28 DIAGNOSIS — E039 Hypothyroidism, unspecified: Secondary | ICD-10-CM | POA: Diagnosis present

## 2022-10-28 DIAGNOSIS — I272 Pulmonary hypertension, unspecified: Secondary | ICD-10-CM | POA: Diagnosis present

## 2022-10-28 DIAGNOSIS — Z515 Encounter for palliative care: Secondary | ICD-10-CM | POA: Diagnosis not present

## 2022-10-28 DIAGNOSIS — I10 Essential (primary) hypertension: Secondary | ICD-10-CM | POA: Diagnosis not present

## 2022-10-28 DIAGNOSIS — E876 Hypokalemia: Secondary | ICD-10-CM | POA: Diagnosis present

## 2022-10-28 DIAGNOSIS — I4819 Other persistent atrial fibrillation: Secondary | ICD-10-CM | POA: Diagnosis not present

## 2022-10-28 DIAGNOSIS — D631 Anemia in chronic kidney disease: Secondary | ICD-10-CM | POA: Diagnosis present

## 2022-10-28 DIAGNOSIS — Z7901 Long term (current) use of anticoagulants: Secondary | ICD-10-CM | POA: Diagnosis not present

## 2022-10-28 DIAGNOSIS — R570 Cardiogenic shock: Secondary | ICD-10-CM | POA: Diagnosis not present

## 2022-10-28 DIAGNOSIS — J9622 Acute and chronic respiratory failure with hypercapnia: Secondary | ICD-10-CM | POA: Diagnosis present

## 2022-10-28 DIAGNOSIS — I5031 Acute diastolic (congestive) heart failure: Secondary | ICD-10-CM

## 2022-10-28 DIAGNOSIS — I5021 Acute systolic (congestive) heart failure: Secondary | ICD-10-CM | POA: Insufficient documentation

## 2022-10-28 DIAGNOSIS — J440 Chronic obstructive pulmonary disease with acute lower respiratory infection: Secondary | ICD-10-CM | POA: Diagnosis not present

## 2022-10-28 DIAGNOSIS — E8809 Other disorders of plasma-protein metabolism, not elsewhere classified: Secondary | ICD-10-CM | POA: Diagnosis present

## 2022-10-28 DIAGNOSIS — Z79890 Hormone replacement therapy (postmenopausal): Secondary | ICD-10-CM | POA: Diagnosis not present

## 2022-10-28 DIAGNOSIS — I081 Rheumatic disorders of both mitral and tricuspid valves: Secondary | ICD-10-CM | POA: Diagnosis present

## 2022-10-28 DIAGNOSIS — N179 Acute kidney failure, unspecified: Secondary | ICD-10-CM | POA: Diagnosis present

## 2022-10-28 DIAGNOSIS — I130 Hypertensive heart and chronic kidney disease with heart failure and stage 1 through stage 4 chronic kidney disease, or unspecified chronic kidney disease: Secondary | ICD-10-CM | POA: Diagnosis present

## 2022-10-28 DIAGNOSIS — H353130 Nonexudative age-related macular degeneration, bilateral, stage unspecified: Secondary | ICD-10-CM | POA: Diagnosis present

## 2022-10-28 DIAGNOSIS — I5033 Acute on chronic diastolic (congestive) heart failure: Secondary | ICD-10-CM | POA: Diagnosis present

## 2022-10-28 LAB — CBC
HCT: 29 % — ABNORMAL LOW (ref 36.0–46.0)
Hemoglobin: 8.9 g/dL — ABNORMAL LOW (ref 12.0–15.0)
MCH: 30.7 pg (ref 26.0–34.0)
MCHC: 30.7 g/dL (ref 30.0–36.0)
MCV: 100 fL (ref 80.0–100.0)
Platelets: 242 10*3/uL (ref 150–400)
RBC: 2.9 MIL/uL — ABNORMAL LOW (ref 3.87–5.11)
RDW: 18.5 % — ABNORMAL HIGH (ref 11.5–15.5)
WBC: 6.3 10*3/uL (ref 4.0–10.5)
nRBC: 0.3 % — ABNORMAL HIGH (ref 0.0–0.2)

## 2022-10-28 LAB — BASIC METABOLIC PANEL
Anion gap: 8 (ref 5–15)
BUN: 18 mg/dL (ref 8–23)
CO2: 30 mmol/L (ref 22–32)
Calcium: 8.5 mg/dL — ABNORMAL LOW (ref 8.9–10.3)
Chloride: 98 mmol/L (ref 98–111)
Creatinine, Ser: 0.97 mg/dL (ref 0.44–1.00)
GFR, Estimated: 55 mL/min — ABNORMAL LOW (ref >60)
Glucose, Bld: 95 mg/dL (ref 70–99)
Potassium: 3.6 mmol/L (ref 3.5–5.1)
Sodium: 136 mmol/L (ref 135–145)

## 2022-10-28 LAB — ECHOCARDIOGRAM COMPLETE
Height: 60 in
MV M vel: 5.81 m/s
MV Peak grad: 135 mmHg
Radius: 0.3 cm
S' Lateral: 2.7 cm
Weight: 2116.42 oz

## 2022-10-28 LAB — MAGNESIUM: Magnesium: 1.9 mg/dL (ref 1.7–2.4)

## 2022-10-28 MED ORDER — ALBUMIN HUMAN 25 % IV SOLN
25.0000 g | Freq: Four times a day (QID) | INTRAVENOUS | Status: AC
  Administered 2022-10-28 (×2): 25 g via INTRAVENOUS
  Filled 2022-10-28 (×2): qty 100

## 2022-10-28 NOTE — Progress Notes (Signed)
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 -She was placed on 2 L oxygen at the facility for the last 2 weeks, brought to the ER with worsening dyspnea 4/15, workup noted BNP of 419, creatinine 1.8, calcium 8.3, hemoglobin stable at 8.9, chest x-ray noted bilateral pleural effusions and asymmetric edema versus atelectasis   Subjective: -Weak, laying in the ER, short of breath  Assessment and Plan:  Acute on chronic diastolic CHF  Anasarca -Last echo 6/23 with preserved EF, normal RV, indeterminate diastolic function, mild to moderate MR -Hypoalbuminemia contributing to third spacing -Continue IV Lasix today, add 2 doses of albumin  -Repeat echo ordered on admission, will follow-up -GDMT limited by CKD -May need palliative eval inpatient versus outpatient   CKD3b -Creatinine stable, monitor with diuresis   Anemia History of recent GI bleed -Hemoglobin stable, continue PPI    P.Atrial fibrillation -Remains in A-fib, heart rate controlled - Continue home amiodarone, metoprolol, Xarelto   Hypertension - Continue home metoprolol   Hypothyroidism - Continue home Synthroid    DVT prophylaxis:xarelto Code Status: Full Code, has MOST form in room Family Communication: Patient seen in the ER, no family at bedside Disposition Plan: To SNF once stable  Consultants:    Procedures:   Antimicrobials:    Objective: Vitals:   10/28/22 0600 10/28/22 0658 10/28/22 0900 10/28/22 1000  BP: 138/69  129/71 (!) 145/76  Pulse: 77  80 78  Resp: Temp:  98.5 F (36.9 C)    TempSrc:  Oral    SpO2: 95%  95% 95%   No intake or output data in the 24  hours ending 10/28/22 1206 There were no vitals filed for this visit.  Examination:  General exam: Elderly chronically ill female laying in bed, AAOx3, no distress HEENT: Positive JVD CVS: S1-S2, irregular, systolic murmur decreased breath sounds to bases Abdomen: Soft, obese, nontender, abdominal wall edema noted Extremities: 2+ edema extending to abdomen  Skin: No rashes Psychiatry:  Mood & affect appropriate.     Data Reviewed:   CBC: Recent Labs  Lab 10/30/2022 1240 10/28/22 0358  WBC 6.9 6.3  HGB 8.9* 8.9*  HCT 30.6* 29.0*  MCV 103.0* 100.0  PLT 227 242   Basic Metabolic Panel: Recent Labs  Lab 10/28/2022 1240 10/28/22 0358  NA 136 136  K 3.8 3.6  CL 99 98  CO2 27 30  GLUCOSE 100* 95  BUN 20 18  CREATININE 1.03* 0.97  CALCIUM 8.3* 8.5*  MG  --  1.9   GFR: CrCl cannot be calculated (Unknown ideal weight.). Liver Function Tests: Recent Labs  Lab 10/20/2022 1240  AST 23  ALT 30  ALKPHOS 56  BILITOT 0.8  PROT 5.2*  ALBUMIN 2.6*   No results for input(s): "LIPASE", "AMYLASE" in the last 168 hours. No results for input(s): "AMMONIA" in the last 168 hours. Coagulation Profile: No results for input(s): "INR", "PROTIME" in the last 168 hours. Cardiac Enzymes: No results for input(s): "CKTOTAL", "CKMB", "CKMBINDEX", "TROPONINI" in the last 168 hours. BNP (last 3 results) Recent Labs    02/13/22 1526  PROBNP 2,289*   HbA1C: No results for input(s): "HGBA1C" in the last 72  hours. CBG: No results for input(s): "GLUCAP" in the last 168 hours. Lipid Profile: No results for input(s): "CHOL", "HDL", "LDLCALC", "TRIG", "CHOLHDL", "LDLDIRECT" in the last 72 hours. Thyroid Function Tests: Recent Labs    10/26/2022 1240  TSH 3.315   Anemia Panel: No results for input(s): "VITAMINB12", "FOLATE", "FERRITIN", "TIBC", "IRON", "RETICCTPCT" in the last 72 hours. Urine analysis:    Component Value Date/Time   COLORURINE YELLOW 09/25/2022 0650   APPEARANCEUR  CLEAR 09/25/2022 0650   LABSPEC 1.013 09/25/2022 0650   PHURINE 5.0 09/25/2022 0650   GLUCOSEU 50 (A) 09/25/2022 0650   HGBUR LARGE (A) 09/25/2022 0650   BILIRUBINUR NEGATIVE 09/25/2022 0650   BILIRUBINUR n 01/01/2015 1622   KETONESUR NEGATIVE 09/25/2022 0650   PROTEINUR NEGATIVE 09/25/2022 0650   UROBILINOGEN 0.2 03/21/2019 1456   NITRITE NEGATIVE 09/25/2022 0650   LEUKOCYTESUR TRACE (A) 09/25/2022 0650   Sepsis Labs: (procalcitonin:4,lacticidven:4)  )No results found for this or any previous visit (from the past 240 hour(s)).   Radiology Studies: DG Chest Portable 1 View  Result Date: 10/19/2022 CLINICAL DATA:  Shortness of breath and fluid retention. EXAM: PORTABLE CHEST 1 VIEW COMPARISON:  Chest x-ray dated September 25, 2022. FINDINGS: The patient is rotated to the right. Unchanged cardiomegaly. New small bilateral pleural effusions. Asymmetric opacity in the right lung. No pneumothorax. No acute osseous abnormality. IMPRESSION: 1. New small bilateral pleural effusions. 2. Asymmetric opacity in the right lung could reflect asymmetric edema or atelectasis. Electronically Signed   By: Obie Dredge M.D.   On: 11/08/2022 13:44     Scheduled Meds:  amiodarone  200 mg Oral Daily   furosemide  60 mg Intravenous BID   levothyroxine  88 mcg Oral Q0600   melatonin  5 mg Oral QHS   metoprolol succinate  25 mg Oral Daily   pantoprazole  40 mg Oral BID   Rivaroxaban  15 mg Oral Daily   sodium chloride flush  3 mL Intravenous Q12H   Continuous Infusions:  albumin human 25 g (10/28/22 1143)     LOS: 0 days    Time spent:13min  Zannie Cove, MD [facility]   10/28/2022, 12:06 PM

## 2022-10-28 NOTE — ED Notes (Signed)
Pt moved over to hospital bed, no acute distress noted. Vitals stable. Call bell in reach.

## 2022-10-28 NOTE — Progress Notes (Signed)
  Echocardiogram 2D Echocardiogram has been performed.  Delcie Roch 10/28/2022, 2:51 PM

## 2022-10-28 NOTE — ED Notes (Signed)
Report

## 2022-10-28 NOTE — Progress Notes (Signed)
Heart Failure Navigator Progress Note  Assessed for Heart & Vascular TOC clinic readiness.  Patient with EF 60-65%, has a scheduled appointment with [facility] on 11/11/2022. .   Navigator will sign off at this time.   Rhae Hammock, BSN, Scientist, clinical (histocompatibility and immunogenetics) Only

## 2022-10-28 NOTE — ED Notes (Signed)
ED TO INPATIENT HANDOFF REPORT  ED Nurse Name and Phone #: [phone]  S Name/Age/Gender Janice Small 89 y.o. female Room/Bed: 039C/039C  Code Status   Code Status: Full Code  Home/SNF/Other Skilled nursing facility Patient oriented to: self, place, and time Is this baseline? Yes   Triage Complete: Triage complete  Chief Complaint Acute on chronic diastolic CHF (congestive heart failure) [I50.33] Acute systolic CHF (congestive heart failure) [I50.21]  Triage Note Pt bib ems coming from [facility]. Pt arrived to facility 3 weeks ago after significant GI bleed with blood transfusions. Staff reports progressing fluid retention (leg and abdominal edema) and SOB x couple weeks. Lasix was recently started back/increased with no improvement. Pt alert and oriented in triage.   EMS vitals: 94% 2L [place] (on oxygen at baseline) BP 140/72 RR 32 HR 60-90 a fib Cbg 135   Allergies Allergies  Allergen Reactions   Codeine Phosphate Nausea And Vomiting    Level of Care/Admitting Diagnosis ED Disposition     ED Disposition  Admit   Condition  --   Comment  Hospital Area: [facility] [[id number]]  Level of Care: Telemetry Cardiac [[id number]]  May admit patient to [facility] or [facility] if equivalent level of care is available:: No  Covid Evaluation: Asymptomatic - no recent exposure (last 10 days) testing not required  Diagnosis: Acute systolic CHF (congestive heart failure) [161096]  Admitting Physician: Zannie Cove [[id number]]  Attending Physician: Zannie Cove [[id number]]  Certification:: I certify this patient will need inpatient services for at least 2 midnights  Estimated Length of Stay: 2          B Medical/Surgery History Past Medical History:  Diagnosis Date   AKI (acute kidney injury)    Atrial fibrillation    BREAST BIOPSY, HX OF 11/27/2006   CHF (congestive heart failure)    COLONIC POLYPS, HX OF 11/27/2006   HEMORRHOIDS 01/17/2010   HYPERTENSION  11/27/2006   Hypertensive retinopathy    OU   HYPOTHYROIDISM 11/27/2006   Macular degeneration    Dry OU   MENOPAUSAL SYNDROME 11/27/2006   Vascular disease    Past Surgical History:  Procedure Laterality Date   ABDOMINAL HYSTERECTOMY     APPENDECTOMY     BIOPSY  09/26/2022   Procedure: BIOPSY;  Surgeon: Lemar Lofty., MD;  Location: [facility] ENDOSCOPY;  Service: Gastroenterology;;   BREAST EXCISIONAL BIOPSY Right 1987   BREAST SURGERY     bx   CARDIOVERSION N/A 04/16/2022   Procedure: CARDIOVERSION;  Surgeon: Parke Poisson, MD;  Location: [facility] ENDOSCOPY;  Service: Cardiovascular;  Laterality: N/A;   CATARACT EXTRACTION Bilateral    COLONOSCOPY WITH PROPOFOL N/A 09/27/2022   Procedure: COLONOSCOPY WITH PROPOFOL;  Surgeon: Lemar Lofty., MD;  Location: [facility] ENDOSCOPY;  Service: Gastroenterology;  Laterality: N/A;   ESOPHAGEAL BRUSHING  09/26/2022   Procedure: ESOPHAGEAL BRUSHING;  Surgeon: Meridee Score Netty Starring., MD;  Location: [facility] ENDOSCOPY;  Service: Gastroenterology;;   ESOPHAGOGASTRODUODENOSCOPY N/A 09/26/2022   Procedure: ESOPHAGOGASTRODUODENOSCOPY (EGD);  Surgeon: Lemar Lofty., MD;  Location: [facility] ENDOSCOPY;  Service: Gastroenterology;  Laterality: N/A;   EYE SURGERY Bilateral    Cat Sx OU   FLEXIBLE SIGMOIDOSCOPY N/A 09/26/2022   Procedure: FLEXIBLE SIGMOIDOSCOPY;  Surgeon: Meridee Score Netty Starring., MD;  Location: [facility] ENDOSCOPY;  Service: Gastroenterology;  Laterality: N/A;   GIVENS CAPSULE STUDY N/A 09/27/2022   Procedure: GIVENS CAPSULE STUDY;  Surgeon: Lemar Lofty., MD;  Location: [facility] ENDOSCOPY;  Service: Gastroenterology;  Laterality: N/A;  HEMORRHOID BANDING     HEMOSTASIS CLIP PLACEMENT  09/27/2022   Procedure: HEMOSTASIS CLIP PLACEMENT;  Surgeon: Lemar Lofty., MD;  Location: [facility] ENDOSCOPY;  Service: Gastroenterology;;   HOT HEMOSTASIS N/A 09/26/2022   Procedure: HOT HEMOSTASIS (ARGON PLASMA COAGULATION/BICAP);  Surgeon: Lemar Lofty., MD;  Location: [facility] ENDOSCOPY;  Service: Gastroenterology;  Laterality: N/A;   HOT HEMOSTASIS N/A 09/27/2022   Procedure: HOT HEMOSTASIS (ARGON PLASMA COAGULATION/BICAP);  Surgeon: Lemar Lofty., MD;  Location: [facility] ENDOSCOPY;  Service: Gastroenterology;  Laterality: N/A;   TONSILLECTOMY       A IV Location/Drains/Wounds Patient Lines/Drains/Airways Status     Active Line/Drains/Airways     Name Placement date Placement time Site Days   Peripheral IV 10/23/2022 20 G Left Forearm 10/19/2022  1209  Forearm  1   External Urinary Catheter 10/14/2022  1421  --  1   Wound / Incision (Open or Dehisced) 09/25/22 Non-pressure wound Abdomen Medial;Upper pink raw area noted on mid upper abdomen 09/25/22  1700  Abdomen  33   Wound / Incision (Open or Dehisced) Pretibial Distal;Right --  --  Pretibial  --   Wound / Incision (Open or Dehisced) 09/25/22 Pretibial Distal;Left 09/25/22  1700  Pretibial  33   Wound / Incision (Open or Dehisced) 10/15/2022 Coccyx Medial;Left 10/23/2022  1341  Coccyx  1            Intake/Output Last 24 hours No intake or output data in the 24 hours ending 10/28/22 1119  Labs/Imaging Results for orders placed or performed during the hospital encounter of 11/10/2022 (from the past 48 hour(s))  Brain natriuretic peptide     Status: Abnormal   Collection Time: 10/22/2022 12:40 PM  Result Value Ref Range   B Natriuretic Peptide 419.7 (H) 0.0 - 100.0 pg/mL    Comment: Performed at [facility], [address], [place], [place] [address]  Comprehensive metabolic panel     Status: Abnormal   Collection Time: 10/15/2022 12:40 PM  Result Value Ref Range   Sodium 136 135 - 145 mmol/L   Potassium 3.8 3.5 - 5.1 mmol/L   Chloride 99 98 - 111 mmol/L   CO2 27 22 - 32 mmol/L   Glucose, Bld 100 (H) 70 - 99 mg/dL    Comment: Glucose reference range applies only to samples taken after fasting for at least 8 hours.   BUN 20 8 - 23 mg/dL   Creatinine, Ser 1.91 (H) 0.44 -  1.00 mg/dL   Calcium 8.3 (L) 8.9 - 10.3 mg/dL   Total Protein 5.2 (L) 6.5 - 8.1 g/dL   Albumin 2.6 (L) 3.5 - 5.0 g/dL   AST 23 15 - 41 U/L   ALT 30 0 - 44 U/L   Alkaline Phosphatase 56 38 - 126 U/L   Total Bilirubin 0.8 0.3 - 1.2 mg/dL   GFR, Estimated 51 (L) >60 mL/min    Comment: (NOTE) Calculated using the CKD-EPI Creatinine Equation (2021)    Anion gap 10 5 - 15    Comment: Performed at [facility], [address] [place], [place] [address]  CBC     Status: Abnormal   Collection Time: 11/09/2022 12:40 PM  Result Value Ref Range   WBC 6.9 4.0 - 10.5 K/uL   RBC 2.97 (L) 3.87 - 5.11 MIL/uL   Hemoglobin 8.9 (L) 12.0 - 15.0 g/dL   HCT 56.2 (L) 13.0 - 86.5 %   MCV 103.0 (H) 80.0 -  100.0 fL   MCH 30.0 26.0 - 34.0 pg   MCHC 29.1 (L) 30.0 - 36.0 g/dL   RDW 16.1 (H) 09.6 - 04.5 %   Platelets 227 150 - 400 K/uL    Comment: REPEATED TO VERIFY   nRBC 0.0 0.0 - 0.2 %    Comment: Performed at [facility], [address], [place], [place] [address]  TSH     Status: None   Collection Time: 10/21/2022 12:40 PM  Result Value Ref Range   TSH 3.315 0.350 - 4.500 uIU/mL    Comment: Performed by a 3rd Generation assay with a functional sensitivity of <=0.01 uIU/mL. Performed at [facility], [address] [place], [place] [address]   Magnesium     Status: None   Collection Time: 10/28/22  3:58 AM  Result Value Ref Range   Magnesium 1.9 1.7 - 2.4 mg/dL    Comment: Performed at [facility], [address] [place], [place] [address]  Basic metabolic panel     Status: Abnormal   Collection Time: 10/28/22  3:58 AM  Result Value Ref Range   Sodium 136 135 - 145 mmol/L   Potassium 3.6 3.5 - 5.1 mmol/L   Chloride 98 98 - 111 mmol/L   CO2 30 22 - 32 mmol/L   Glucose, Bld 95 70 - 99 mg/dL    Comment: Glucose reference range applies only to samples taken after fasting for at least 8 hours.   BUN 18 8 - 23 mg/dL   Creatinine, Ser 2.13 0.44 - 1.00 mg/dL   Calcium 8.5  (L) 8.9 - 10.3 mg/dL   GFR, Estimated 55 (L) >60 mL/min    Comment: (NOTE) Calculated using the CKD-EPI Creatinine Equation (2021)    Anion gap 8 5 - 15    Comment: Performed at [facility], [address] [place], [place] [address]  CBC     Status: Abnormal   Collection Time: 10/28/22  3:58 AM  Result Value Ref Range   WBC 6.3 4.0 - 10.5 K/uL   RBC 2.90 (L) 3.87 - 5.11 MIL/uL   Hemoglobin 8.9 (L) 12.0 - 15.0 g/dL   HCT 84.6 (L) 96.2 - 95.2 %   MCV 100.0 80.0 - 100.0 fL   MCH 30.7 26.0 - 34.0 pg   MCHC 30.7 30.0 - 36.0 g/dL   RDW 84.1 (H) 32.4 - 40.1 %   Platelets 242 150 - 400 K/uL    Comment: REPEATED TO VERIFY   nRBC 0.3 (H) 0.0 - 0.2 %    Comment: Performed at [facility], [address], [place], [place] [address]   DG Chest Portable 1 View  Result Date: 11/08/2022 CLINICAL DATA:  Shortness of breath and fluid retention. EXAM: PORTABLE CHEST 1 VIEW COMPARISON:  Chest x-ray dated September 25, 2022. FINDINGS: The patient is rotated to the right. Unchanged cardiomegaly. New small bilateral pleural effusions. Asymmetric opacity in the right lung. No pneumothorax. No acute osseous abnormality. IMPRESSION: 1. New small bilateral pleural effusions. 2. Asymmetric opacity in the right lung could reflect asymmetric edema or atelectasis. Electronically Signed   By: Obie Dredge M.D.   On: 11/11/2022 13:44    Pending Labs Unresulted Labs (From admission, onward)    None       Vitals/Pain Today's Vitals   10/28/22 0600 10/28/22 0658 10/28/22 0900 10/28/22 1000  BP: 138/69  129/71 (!) 145/76  Pulse: 77  80 78  Resp: 16  19 19   Temp:  98.5 F (36.9 C)    TempSrc:  Oral    SpO2: 95%  95% 95%  PainSc:        Isolation Precautions No active isolations  Medications Medications  amiodarone (PACERONE) tablet 200 mg (200 mg Oral Given 10/28/22 0953)  metoprolol succinate (TOPROL-XL) 24 hr tablet 25 mg (25 mg Oral Given 10/28/22 0953)  levothyroxine (SYNTHROID)  tablet 88 mcg (88 mcg Oral Given 10/28/22 0534)  pantoprazole (PROTONIX) EC tablet 40 mg (40 mg Oral Given 10/28/22 0953)  Rivaroxaban (XARELTO) tablet 15 mg (15 mg Oral Given 10/28/22 0954)  melatonin tablet 5 mg (5 mg Oral Given 10/25/2022 2159)  sodium chloride flush (NS) 0.9 % injection 3 mL (3 mLs Intravenous Not Given 10/28/22 0954)  acetaminophen (TYLENOL) tablet 650 mg (has no administration in time range)    Or  acetaminophen (TYLENOL) suppository 650 mg (has no administration in time range)  polyethylene glycol (MIRALAX / GLYCOLAX) packet 17 g (has no administration in time range)  furosemide (LASIX) injection 60 mg (60 mg Intravenous Given 10/28/22 0954)  albumin human 25 % solution 25 g (has no administration in time range)  furosemide (LASIX) injection 40 mg (40 mg Intravenous Given 10/20/2022 1502)    Mobility walks with device     Focused Assessments Cardiac Assessment Handoff:  Cardiac Rhythm: Atrial fibrillation No results found for: "CKTOTAL", "CKMB", "CKMBINDEX", "TROPONINI" No results found for: "DDIMER" Does the Patient currently have chest pain? No    R Recommendations: See Admitting Provider Note  Report given to:   Additional Notes:

## 2022-10-28 NOTE — ED Notes (Signed)
Report attempted to be called no answer from floor RN

## 2022-10-29 ENCOUNTER — Inpatient Hospital Stay (HOSPITAL_COMMUNITY): Payer: Medicare Other

## 2022-10-29 DIAGNOSIS — I4819 Other persistent atrial fibrillation: Secondary | ICD-10-CM | POA: Diagnosis not present

## 2022-10-29 DIAGNOSIS — I10 Essential (primary) hypertension: Secondary | ICD-10-CM | POA: Diagnosis not present

## 2022-10-29 DIAGNOSIS — E440 Moderate protein-calorie malnutrition: Secondary | ICD-10-CM

## 2022-10-29 DIAGNOSIS — J449 Chronic obstructive pulmonary disease, unspecified: Secondary | ICD-10-CM

## 2022-10-29 DIAGNOSIS — I5033 Acute on chronic diastolic (congestive) heart failure: Secondary | ICD-10-CM | POA: Diagnosis not present

## 2022-10-29 DIAGNOSIS — E039 Hypothyroidism, unspecified: Secondary | ICD-10-CM | POA: Diagnosis not present

## 2022-10-29 LAB — CBC
HCT: 26 % — ABNORMAL LOW (ref 36.0–46.0)
Hemoglobin: 8.1 g/dL — ABNORMAL LOW (ref 12.0–15.0)
MCH: 30.6 pg (ref 26.0–34.0)
MCHC: 31.2 g/dL (ref 30.0–36.0)
MCV: 98.1 fL (ref 80.0–100.0)
Platelets: 230 10*3/uL (ref 150–400)
RBC: 2.65 MIL/uL — ABNORMAL LOW (ref 3.87–5.11)
RDW: 18.5 % — ABNORMAL HIGH (ref 11.5–15.5)
WBC: 4.3 10*3/uL (ref 4.0–10.5)
nRBC: 0.7 % — ABNORMAL HIGH (ref 0.0–0.2)

## 2022-10-29 LAB — BASIC METABOLIC PANEL
Anion gap: 9 (ref 5–15)
BUN: 19 mg/dL (ref 8–23)
CO2: 32 mmol/L (ref 22–32)
Calcium: 8.6 mg/dL — ABNORMAL LOW (ref 8.9–10.3)
Chloride: 95 mmol/L — ABNORMAL LOW (ref 98–111)
Creatinine, Ser: 0.96 mg/dL (ref 0.44–1.00)
GFR, Estimated: 56 mL/min — ABNORMAL LOW (ref >60)
Glucose, Bld: 123 mg/dL — ABNORMAL HIGH (ref 70–99)
Potassium: 3 mmol/L — ABNORMAL LOW (ref 3.5–5.1)
Sodium: 136 mmol/L (ref 135–145)

## 2022-10-29 MED ORDER — IPRATROPIUM-ALBUTEROL 0.5-2.5 (3) MG/3ML IN SOLN
3.0000 mL | RESPIRATORY_TRACT | Status: DC | PRN
  Administered 2022-10-29 – 2022-10-30 (×3): 3 mL via RESPIRATORY_TRACT
  Filled 2022-10-29 (×3): qty 3

## 2022-10-29 MED ORDER — POTASSIUM CHLORIDE CRYS ER 20 MEQ PO TBCR
40.0000 meq | EXTENDED_RELEASE_TABLET | Freq: Once | ORAL | Status: AC
  Administered 2022-10-29: 40 meq via ORAL
  Filled 2022-10-29: qty 2

## 2022-10-29 MED ORDER — IPRATROPIUM-ALBUTEROL 0.5-2.5 (3) MG/3ML IN SOLN: 3.0000 mL | RESPIRATORY_TRACT | Status: DC

## 2022-10-29 MED ORDER — BUDESONIDE 0.25 MG/2ML IN SUSP
0.2500 mg | Freq: Two times a day (BID) | RESPIRATORY_TRACT | Status: DC
  Administered 2022-10-29 – 2022-10-30 (×2): 0.25 mg via RESPIRATORY_TRACT
  Filled 2022-10-29 (×2): qty 2

## 2022-10-29 MED ORDER — ALPRAZOLAM 0.5 MG PO TABS
0.5000 mg | ORAL_TABLET | Freq: Three times a day (TID) | ORAL | Status: DC | PRN
  Administered 2022-10-29: 0.5 mg via ORAL
  Filled 2022-10-29: qty 1

## 2022-10-29 MED ORDER — FUROSEMIDE 10 MG/ML IJ SOLN
40.0000 mg | Freq: Once | INTRAMUSCULAR | Status: AC
  Administered 2022-10-29: 40 mg via INTRAVENOUS
  Filled 2022-10-29: qty 4

## 2022-10-29 MED ORDER — IPRATROPIUM-ALBUTEROL 0.5-2.5 (3) MG/3ML IN SOLN: 3.0000 mL | Freq: Three times a day (TID) | RESPIRATORY_TRACT | Status: DC

## 2022-10-29 MED ORDER — SPIRONOLACTONE 12.5 MG HALF TABLET: 12.5000 mg | ORAL_TABLET | Freq: Every day | ORAL | Status: DC

## 2022-10-29 MED ORDER — ADULT MULTIVITAMIN W/MINERALS CH
1.0000 | ORAL_TABLET | Freq: Every day | ORAL | Status: DC
  Administered 2022-10-29: 1 via ORAL
  Filled 2022-10-29: qty 1

## 2022-10-29 MED ORDER — FUROSEMIDE 40 MG PO TABS: 40.0000 mg | ORAL_TABLET | Freq: Every day | ORAL | Status: DC

## 2022-10-29 MED ORDER — ENSURE ENLIVE PO LIQD
237.0000 mL | Freq: Two times a day (BID) | ORAL | Status: DC
  Administered 2022-10-29: 237 mL via ORAL

## 2022-10-29 MED ORDER — POTASSIUM CHLORIDE CRYS ER 20 MEQ PO TBCR
40.0000 meq | EXTENDED_RELEASE_TABLET | ORAL | Status: AC
  Administered 2022-10-29 (×2): 40 meq via ORAL
  Filled 2022-10-29 (×2): qty 2

## 2022-10-29 NOTE — Progress Notes (Signed)
RT called to patient's room due to patient stating she feels like she can't breath well. Patient on [place] 2L, sats 95%. RT placed patient on 6L [place] to provide a higher flow for comfort.  MD at bedside now.

## 2022-10-29 NOTE — Progress Notes (Signed)
Patient appears calm after Tylenol. Work of breathing has improved.

## 2022-10-29 NOTE — Assessment & Plan Note (Signed)
Continue with levothyroxine  

## 2022-10-29 NOTE — Progress Notes (Signed)
RT instructed patient and family on the use of a flutter valve. Patient able to demonstrate back good technique. 

## 2022-10-29 NOTE — Assessment & Plan Note (Signed)
Hypokalemia Patient with chronic respiratory acidosis, with metabolic alkalosis.  Plan to continue comfort support. No further blood work.

## 2022-10-29 NOTE — Assessment & Plan Note (Signed)
Continue with nutritional supplements.  

## 2022-10-29 NOTE — Progress Notes (Addendum)
   10/29/22 2026  Vitals  Pulse Rate 92  ECG Heart Rate 88  Resp 19  Level of Consciousness  Level of Consciousness Alert  Oxygen Therapy  SpO2 97 %  O2 Device HFNC  Pain Assessment  Pain Scale 0-10  Pain Score 3  Pain Location Generalized  Pain Frequency Intermittent  Pain Onset On-going  Pain Intervention(s) Medication (See eMAR)  PCA/Epidural/Spinal Assessment  Respiratory Pattern Symmetrical;Dyspnea with exertion;Unlabored  Glasgow Coma Scale  Eye Opening 4  Best Verbal Response (NON-intubated) 4  Best Motor Response 5  Glasgow Coma Scale Score 13  MEWS Score  MEWS Temp 0  MEWS Systolic 0  MEWS Pulse 0  MEWS RR 0  MEWS LOC 0  MEWS Score 0  MEWS Score Color Green     Dr. Margo Aye paged regarding increased work of breathing, dyspnea, Rhonchi and congestion. See new orders.   HFNC increased to 5L.

## 2022-10-29 NOTE — Progress Notes (Signed)
Patient complains of feeling SOB. Sats remain in high 90's on 2L [place]. Order received for breathing treatment.

## 2022-10-29 NOTE — Hospital Course (Addendum)
Janice Small was admitted to the hospital with the working diagnosis of acute on chronic heart failure exacerbation.   89 yo female with the past medical history of diastolic heart failure, CKD , atrial fibrillation, hypertension and hypothyroidism who presented with dyspnea. Recent hospitalization 09/25/22 to 10/01/22 for upper GI bleed, due to gastric ulcers and single AVM, discharged to SNF.  At the rehab she was noted to have worsening lower extremity edema, dyspnea on exertion and fatigue. Positive orthopnea. Her furosemide was increased with no improvement of her symptoms. On her initial physical examination her blood pressure was 119/78, HR 81 and RR 19, 02 saturation 97%, lungs with rales with no wheezing, heart with S1 and S2 present and rhythmic, abdomen with no distention and positive lower extremity edema.   Patient was placed on IV furosemide and IV albumin.   Chest radiograph with right rotation, with signs of air trapping, bilateral hilar vascular congestion.   04/18 patient with worsening respiratory failure more congestion.  Decreased responsiveness, increased work of breathing. She was not able to tolerate Bipap. Follow up chest film with persistent edema.  Last night added antibiotic for possible pneumonia.   Patient with very poor prognosis, she is not responding to aggressive medical therapy. Her children at the bedside, they have decided to continue care under comfort measures.  Code status changed to DNR. Transition to full comfort care.

## 2022-10-29 NOTE — Progress Notes (Signed)
Progress Note   Patient: Janice Small:[id number] DOB: 1927/11/22 DOA: 11/02/2022     1 DOS: the patient was seen and examined on 10/29/2022   Brief hospital course: Janice Small was admitted to the hospital with the working diagnosis of acute on chronic heart failure exacerbation.   89 yo female with the past medical history of diastolic heart failure, CKD , atrial fibrillation, hypertension and hypothyroidism who presented with dyspnea. Recent hospitalization 09/25/22 to 10/01/22 for upper GI bleed, due to gastric ulcers and single AVM, discharged to SNF.  At the rehab she was noted to have worsening lower extremity edema, dyspnea on exertion and fatigue. Positive orthopnea. Her furosemide was increased with no improvement of her symptoms. On her initial physical examination her blood pressure was 119/78, HR 81 and RR 19, 02 saturation 97%, lungs with rales with no wheezing, heart with S1 and S2 present and rhythmic, abdomen with no distention and positive lower extremity edema.   Patient was placed on IV furosemide and IV albumin.    Assessment and Plan: * Acute on chronic diastolic CHF (congestive heart failure) Echocardiogram with preserved LV systolic function with EF 60 to 65%, RV with normal systolic function, RVSP 64.0 mmHg LA with severe dilatation, RA with severe dilatation, trivial pericardial effusion, severe mitral valve regurgitation, severe tricuspid valve regurgitation, pulmonic valve with moderate to severe regurgitation.   Acute on chronic core pulmonale. Pulmonary hypertension.  Urine output is 1,025 ml Systolic blood pressure is 156 to 160 mmHg.   Volume status has improved, will transition to oral loop diuretic.  Continue with metoprolol and will add spironolactone.   Atrial fibrillation Continue rate control with amiodarone and metoprolol. Anticoagulation with rivaroxaban. Continue telemetry monitoring.   Acute renal failure superimposed on stage 3b chronic  kidney disease Hypokalemia  Renal function with serum cr at 0,96 with K at 3,0 and serum bicarbonate ate 32. Na is 136. Mg 1,9  Plan to transition to oral furosemide Add Kcl 40 meq x2 and follow up renal function in am.  Add spironolactone.   Essential hypertension Continue blood pressure control with metoprolol.   Hypothyroidism Continue with levothyroxine.   Malnutrition of moderate degree Continue with nutritional supplements.   COPD (chronic obstructive pulmonary disease) Patient with clinical signs of air trapping suggesting COPD.  Plan to add bronchodilator therapy and inhaled corticosteroids. Continue oxymetry monitoring.   Add alprazolam as needed for dyspnea.         Subjective: Patient with anxiety, not clear if dyspnea as well, no chest pain, improving edema   Physical Exam: Vitals:   10/29/22 0404 10/29/22 0723 10/29/22 1142 10/29/22 1348  BP: (!) 145/73 (!) 160/77 (!) 156/78   Pulse: 73 67 81 73  Resp: Temp: 97.6 F (36.4 C) 97.6 F (36.4 C) 97.7 F (36.5 C)   TempSrc: Oral Oral Oral   SpO2: 98% 100% 90% 100%  Weight: 62.2 kg     Height:       Neurology awake and alert, deconditioned and ill looking appearing  ENT with pallor Cardiovascular with S1 and S2 present with no gallops, positive systolic murmur at the apex Mild JVD No lower extremity edema  Respiratory with scattered rhonchi, no rales, positive prolonged expiratory phase.  Data Reviewed:    Family Communication: I spoke with patient's family at the bedside, we talked in detail about patient's condition, plan of care and prognosis and all questions were addressed.  Disposition: Status is: Inpatient Remains  inpatient appropriate because: SNF   Planned Discharge Destination: Skilled nursing facility      Author: Coralie Keens, MD 10/29/2022 2:40 PM  For on call review [email or web address].

## 2022-10-29 NOTE — Assessment & Plan Note (Signed)
Echocardiogram with preserved LV systolic function with EF 60 to 65%, RV with normal systolic function, RVSP 64.0 mmHg LA with severe dilatation, RA with severe dilatation, trivial pericardial effusion, severe mitral valve regurgitation, severe tricuspid valve regurgitation, pulmonic valve with moderate to severe regurgitation.   Acute on chronic core pulmonale. Pulmonary hypertension.  Urine output is 1,025 ml Systolic blood pressure is 156 to 160 mmHg.   Volume status has improved, will transition to oral loop diuretic.  Continue with metoprolol and will add spironolactone.

## 2022-10-29 NOTE — Assessment & Plan Note (Addendum)
Transitioned to comfort care

## 2022-10-29 NOTE — Evaluation (Signed)
Physical Therapy Evaluation Patient Details Name: Janice Small MRN: [id number] DOB: February 24, 1928 Today's Date: 10/29/2022  History of Present Illness  89 year old admitted on 11/05/2022 from [facility] for SOB. Past medical history significant of diastolic CHF, CKD 3B, atrial fibrillation, hypertension, hypothyroidism, GI bleed.  Clinical Impression  Pt presents with admitting diagnosis above. Pt presents from SNF and was a poor historian. Pt son arrived at end of session and able to fill in history. Pt son reports that pt had been at [facility] since previous admission in March. Prior to first admission pt was Mod I with SPC and lived alone however recently they were searching for an ALF due to pt declining health and mobility. Today, pt was able to sit EOB, stand with RW, and take a few sidesteps along EOB with Min/Mod A. Given pt PLOF of Mod I, recommend that pt return to SNF at DC to improve functional independence. PT will continue to follow.     Recommendations for follow up therapy are one component of a multi-disciplinary discharge planning process, led by the attending physician.  Recommendations may be updated based on patient status, additional functional criteria and insurance authorization.  Follow Up Recommendations Can patient physically be transported by private vehicle: No     Assistance Recommended at Discharge Frequent or constant Supervision/Assistance  Patient can return home with the following  A lot of help with walking and/or transfers;A lot of help with bathing/dressing/bathroom;Assistance with cooking/housework;Direct supervision/assist for medications management;Assist for transportation;Help with stairs or ramp for entrance    Equipment Recommendations Other (comment) (Per accepting facility)  Recommendations for Other Services  OT consult    Functional Status Assessment Patient has had a recent decline in their functional status and demonstrates the ability to make  significant improvements in function in a reasonable and predictable amount of time.     Precautions / Restrictions Precautions Precautions: Fall Restrictions Weight Bearing Restrictions: No      Mobility  Bed Mobility Overal bed mobility: Needs Assistance Bed Mobility: Supine to Sit, Sit to Supine, Rolling Rolling: Min assist   Supine to sit: Min assist Sit to supine: Mod assist   General bed mobility comments: Assistance with BLE management    Transfers Overall transfer level: Needs assistance Equipment used: Rolling walker (2 wheels) Transfers: Sit to/from Stand Sit to Stand: Min assist           General transfer comment: Cues for hand placement    Ambulation/Gait             Pre-gait activities: Standing marches General Gait Details: Pt able to take a few sidesteps along EOB. Pt declined further mobility.  Stairs            Wheelchair Mobility    Modified Rankin (Stroke Patients Only)       Balance Overall balance assessment: Needs assistance Sitting-balance support: Bilateral upper extremity supported, Feet supported Sitting balance-Leahy Scale: Fair Sitting balance - Comments: Able to sit EOB. Required cues for upright posture Postural control: Left lateral lean Standing balance support: Bilateral upper extremity supported, During functional activity, Reliant on assistive device for balance Standing balance-Leahy Scale: Poor Standing balance comment: Reliant on RW                             Pertinent Vitals/Pain Pain Assessment Pain Assessment: No/denies pain    Home Living Family/patient expects to be discharged to:: Skilled nursing facility  Additional Comments: Prior to previous admission pt lives alone, one son lives down the road and checks in on pt as needed, provides transportation.    Prior Function Prior Level of Function : Patient poor historian/Family not available              Mobility Comments: pt ambulates with SPC in the community, often no AD in her home, son provides transportation, pt and son deny any falls ADLs Comments: Prior to previous admission, Pt reports at SNF they bathed her however she was able to dress herself.     Hand Dominance   Dominant Hand: Right    Extremity/Trunk Assessment   Upper Extremity Assessment Upper Extremity Assessment: Generalized weakness    Lower Extremity Assessment Lower Extremity Assessment: Generalized weakness    Cervical / Trunk Assessment Cervical / Trunk Assessment: Kyphotic  Communication   Communication: No difficulties  Cognition Arousal/Alertness: Awake/alert Behavior During Therapy: Flat affect Overall Cognitive Status: Within Functional Limits for tasks assessed                                          General Comments General comments (skin integrity, edema, etc.): VSS on 2L. Oldest son present at end of session.    Exercises     Assessment/Plan    PT Assessment Patient needs continued PT services  PT Problem List Decreased strength;Decreased range of motion;Decreased activity tolerance       PT Treatment Interventions DME instruction;Gait training;Functional mobility training;Stair training;Therapeutic activities;Therapeutic exercise;Balance training;Neuromuscular re-education;Patient/family education    PT Goals (Current goals can be found in the Care Plan section)  Acute Rehab PT Goals Patient Stated Goal: To get better PT Goal Formulation: With patient/family Time For Goal Achievement: 11/12/22 Potential to Achieve Goals: Fair    Frequency Min 1X/week     Co-evaluation               AM-PAC PT "6 Clicks" Mobility  Outcome Measure Help needed turning from your back to your side while in a flat bed without using bedrails?: A Lot Help needed moving from lying on your back to sitting on the side of a flat bed without using bedrails?: A Lot Help needed  moving to and from a bed to a chair (including a wheelchair)?: A Lot Help needed standing up from a chair using your arms (e.g., wheelchair or bedside chair)?: A Lot Help needed to walk in hospital room?: A Lot Help needed climbing 3-5 steps with a railing? : Total 6 Click Score: 11    End of Session Equipment Utilized During Treatment: Gait belt;Oxygen Activity Tolerance: Patient limited by fatigue Patient left: in bed;with call bell/phone within reach;with bed alarm set;with nursing/sitter in room;with family/visitor present (Handoff to RN) Nurse Communication: Mobility status PT Visit Diagnosis: Other abnormalities of gait and mobility (R26.89)    Time: 1610-9604 PT Time Calculation (min) (ACUTE ONLY): 31 min   Charges:   PT Evaluation $PT Eval Moderate Complexity: 1 Mod PT Treatments $Therapeutic Activity: 8-22 mins        Shela Nevin, PT, DPT Acute Rehab Services [phone]   Gladys Damme 10/29/2022, 12:45 PM

## 2022-10-29 NOTE — Progress Notes (Signed)
Initial Nutrition Assessment  DOCUMENTATION CODES:   Non-severe (moderate) malnutrition in context of chronic illness  INTERVENTION:  Liberalize diet to regular Ensure Enlive po BID, each supplement provides 350 kcal and 20 grams of protein (vanilla) MVI with minerals daily  NUTRITION DIAGNOSIS:   Moderate Malnutrition related to chronic illness as evidenced by mild fat depletion, moderate muscle depletion, edema.  GOAL:   Patient will meet greater than or equal to 90% of their needs  MONITOR:   PO intake, Supplement acceptance, Labs, Weight trends, I & O's  REASON FOR ASSESSMENT:   Malnutrition Screening Tool    ASSESSMENT:   Pt admitted from [facility] with worsening fatigue and SOB r/t acute on chronic diastolic HF. PMH significant for CHF, CKD 3b, afib, HTN, hypothyroidism, GIB.  Pt sitting in bed with noticeable difficulty breathing. RN present preparing to give pt a breathing treatment.   Pt states that she has a fair appetite however d/t difficulty breathing she has a difficult time eating much at one time.  She has been living at [facility] and endorses eating 3 meals per day but states that she typically eats about 50% or less of meals. She states that the food at [facility] is high in sodium which she tries to limit d/t her HF. Denies difficulty chewing or swallowing but has trouble with dry mouth so she tries to drink some fluids with meals to help with this.    Meal completions: 4/16: 0% lunch 4/17: 85% breakfast  Discussed diet liberalization with pt as she is concerned about the salt content of foods. Do not suspect she is exceeding sodium nutrient limits at this time and expressed importance of maintaining adequate nutrition especially in advanced age to maintain quality of life and minimize weight loss as best as possible.   Pt reports that PTA her weight had been declining d/t noticeable changes in her clothes, as they have been fitting more loosely  over time.   Reviewed documented weight history on file. Pt's weight noted to have gradually declined over time. Pt's weight was between 67-68 kg 1 year ago. Her weight is now documented to be 62.2 kg.   Edema: mild pitting BLE  Medications: lasix  BID IV, melatonin, protonix  Labs: potassium 3.0, GFR 56   UOP: 1025 ml x24 hours I/O's: - since admit  NUTRITION - FOCUSED PHYSICAL EXAM:  Flowsheet Row Most Recent Value  Orbital Region No depletion  Upper Arm Region Moderate depletion  Thoracic and Lumbar Region No depletion  Buccal Region Mild depletion  Temple Region No depletion  Clavicle Bone Region Mild depletion  Clavicle and Acromion Bone Region Moderate depletion  Scapular Bone Region Moderate depletion  Dorsal Hand Moderate depletion  Patellar Region Moderate depletion  Anterior Thigh Region Moderate depletion  Posterior Calf Region Mild depletion  Edema (RD Assessment) Mild  [BLE]  Hair Reviewed  Eyes Reviewed  Mouth Reviewed  Skin Reviewed  Nails Reviewed       Diet Order:   Diet Order             Diet regular Room service appropriate? Yes; Fluid consistency: Thin; Fluid restriction: 1500 mL Fluid  Diet effective now                   EDUCATION NEEDS:   Education needs have been addressed  Skin:  Skin Assessment: Reviewed RN Assessment  Last BM:  PTA  Height:   Ht Readings from Last 1 Encounters:  10/28/22 5' (1.524 m)    Weight:   Wt Readings from Last 1 Encounters:  10/29/22 62.2 kg   BMI:  Body mass index is 26.78 kg/m.  Estimated Nutritional Needs:   Kcal:  1300-1500  Protein:  65-80g  Fluid:  1.3-1.5L  Drusilla Kanner, RDN, LDN Clinical Nutrition

## 2022-10-29 NOTE — Telephone Encounter (Signed)
Prior auth required for [employer]  PA PROCESS DETAILS: Effective 07/14/2021 if the patient is new to [facility], Prior authorization and Step Therapy are required & not on file. Please go to [email or web address] or call [phone] to initiate the prior authorization. For exception to the policy please visit [email or web address] and review Policy Number [email or web address]   EXP 08/08/22

## 2022-10-29 NOTE — Assessment & Plan Note (Signed)
Patient with clinical signs of air trapping suggesting COPD.  Plan to add bronchodilator therapy and inhaled corticosteroids. Continue oxymetry monitoring.   Add alprazolam as needed for dyspnea.

## 2022-10-29 NOTE — Progress Notes (Signed)
Patient continues to complain of SOB. Sats remain in high 90's on 3 L [place]. Order for Xanax received.  RT assessed patient and started on 6L high flow for comfort.

## 2022-10-29 NOTE — Assessment & Plan Note (Signed)
Discontinue amiodarone and anticoagulation. Continue comfort care.

## 2022-10-29 NOTE — Plan of Care (Signed)
°  Problem: Education: °Goal: Ability to demonstrate management of disease process will improve °Outcome: Progressing °Goal: Ability to verbalize understanding of medication therapies will improve °Outcome: Progressing °Goal: Individualized Educational Video(s) °Outcome: Progressing °  °

## 2022-10-30 DIAGNOSIS — E039 Hypothyroidism, unspecified: Secondary | ICD-10-CM | POA: Diagnosis not present

## 2022-10-30 DIAGNOSIS — I4819 Other persistent atrial fibrillation: Secondary | ICD-10-CM | POA: Diagnosis not present

## 2022-10-30 DIAGNOSIS — I5033 Acute on chronic diastolic (congestive) heart failure: Secondary | ICD-10-CM | POA: Diagnosis not present

## 2022-10-30 DIAGNOSIS — I10 Essential (primary) hypertension: Secondary | ICD-10-CM | POA: Diagnosis not present

## 2022-10-30 LAB — COMPREHENSIVE METABOLIC PANEL
ALT: 28 U/L (ref 0–44)
AST: 22 U/L (ref 15–41)
Albumin: 2.8 g/dL — ABNORMAL LOW (ref 3.5–5.0)
Alkaline Phosphatase: 61 U/L (ref 38–126)
Anion gap: 10 (ref 5–15)
BUN: 22 mg/dL (ref 8–23)
CO2: 37 mmol/L — ABNORMAL HIGH (ref 22–32)
Calcium: 9.2 mg/dL (ref 8.9–10.3)
Chloride: 95 mmol/L — ABNORMAL LOW (ref 98–111)
Creatinine, Ser: 1.04 mg/dL — ABNORMAL HIGH (ref 0.44–1.00)
GFR, Estimated: 50 mL/min — ABNORMAL LOW (ref >60)
Glucose, Bld: 126 mg/dL — ABNORMAL HIGH (ref 70–99)
Potassium: 4.5 mmol/L (ref 3.5–5.1)
Sodium: 142 mmol/L (ref 135–145)
Total Bilirubin: 0.6 mg/dL (ref 0.3–1.2)
Total Protein: 5.7 g/dL — ABNORMAL LOW (ref 6.5–8.1)

## 2022-10-30 LAB — BLOOD GAS, ARTERIAL
Acid-Base Excess: 12 mmol/L — ABNORMAL HIGH (ref 0.0–2.0)
Acid-Base Excess: 11.5 mmol/L — ABNORMAL HIGH (ref 0.0–2.0)
Bicarbonate: 39.6 mmol/L — ABNORMAL HIGH (ref 20.0–28.0)
Bicarbonate: 40.1 mmol/L — ABNORMAL HIGH (ref 20.0–28.0)
Drawn by: 441371
O2 Saturation: 99.9 %
O2 Saturation: 98.3 %
Patient temperature: 36.4
Patient temperature: 36.5
pCO2 arterial: 62 mmHg — ABNORMAL HIGH (ref 32–48)
pCO2 arterial: 69 mmHg (ref 32–48)
pH, Arterial: 7.41 (ref 7.35–7.45)
pH, Arterial: 7.37 (ref 7.35–7.45)
pO2, Arterial: 104 mmHg (ref 83–108)
pO2, Arterial: 79 mmHg — ABNORMAL LOW (ref 83–108)

## 2022-10-30 LAB — BASIC METABOLIC PANEL
Anion gap: 9 (ref 5–15)
BUN: 24 mg/dL — ABNORMAL HIGH (ref 8–23)
CO2: 35 mmol/L — ABNORMAL HIGH (ref 22–32)
Calcium: 9.1 mg/dL (ref 8.9–10.3)
Chloride: 95 mmol/L — ABNORMAL LOW (ref 98–111)
Creatinine, Ser: 0.96 mg/dL (ref 0.44–1.00)
GFR, Estimated: 56 mL/min — ABNORMAL LOW (ref >60)
Glucose, Bld: 150 mg/dL — ABNORMAL HIGH (ref 70–99)
Potassium: 4.6 mmol/L (ref 3.5–5.1)
Sodium: 139 mmol/L (ref 135–145)

## 2022-10-30 LAB — CBC
HCT: 31.3 % — ABNORMAL LOW (ref 36.0–46.0)
Hemoglobin: 9.6 g/dL — ABNORMAL LOW (ref 12.0–15.0)
MCH: 30.4 pg (ref 26.0–34.0)
MCHC: 30.7 g/dL (ref 30.0–36.0)
MCV: 99.1 fL (ref 80.0–100.0)
Platelets: 275 10*3/uL (ref 150–400)
RBC: 3.16 MIL/uL — ABNORMAL LOW (ref 3.87–5.11)
RDW: 18.6 % — ABNORMAL HIGH (ref 11.5–15.5)
WBC: 8.7 10*3/uL (ref 4.0–10.5)
nRBC: 0.3 % — ABNORMAL HIGH (ref 0.0–0.2)

## 2022-10-30 LAB — PROCALCITONIN: Procalcitonin: 0.22 ng/mL

## 2022-10-30 LAB — MAGNESIUM: Magnesium: 1.8 mg/dL (ref 1.7–2.4)

## 2022-10-30 MED ORDER — HALOPERIDOL 0.5 MG PO TABS: 0.5000 mg | ORAL_TABLET | ORAL | Status: DC | PRN

## 2022-10-30 MED ORDER — GLYCOPYRROLATE 0.2 MG/ML IJ SOLN: 0.2000 mg | INTRAMUSCULAR | Status: DC | PRN

## 2022-10-30 MED ORDER — SODIUM CHLORIDE 0.9 % IV SOLN
2.0000 g | Freq: Every day | INTRAVENOUS | Status: DC
  Administered 2022-10-30: 2 g via INTRAVENOUS
  Filled 2022-10-30: qty 20

## 2022-10-30 MED ORDER — GLYCOPYRROLATE 1 MG PO TABS: 1.0000 mg | ORAL_TABLET | ORAL | Status: DC | PRN

## 2022-10-30 MED ORDER — HYDROXYZINE HCL 10 MG PO TABS
10.0000 mg | ORAL_TABLET | ORAL | Status: AC
  Administered 2022-10-30: 10 mg via ORAL
  Filled 2022-10-30: qty 1

## 2022-10-30 MED ORDER — MORPHINE 100MG IN NS 100ML (1MG/ML) PREMIX INFUSION
1.0000 mg/h | INTRAVENOUS | Status: DC
  Administered 2022-10-30: 1 mg/h via INTRAVENOUS
  Filled 2022-10-30: qty 100

## 2022-10-30 MED ORDER — FUROSEMIDE 10 MG/ML IJ SOLN
20.0000 mg | Freq: Once | INTRAMUSCULAR | Status: AC
  Administered 2022-10-30: 20 mg via INTRAVENOUS
  Filled 2022-10-30: qty 2

## 2022-10-30 MED ORDER — ONDANSETRON HCL 4 MG/2ML IJ SOLN: 4.0000 mg | Freq: Four times a day (QID) | INTRAMUSCULAR | Status: DC | PRN

## 2022-10-30 MED ORDER — GUAIFENESIN-DM 100-10 MG/5ML PO SYRP: 5.0000 mL | ORAL_SOLUTION | ORAL | Status: DC | PRN

## 2022-10-30 MED ORDER — ACETAMINOPHEN 325 MG PO TABS: 650.0000 mg | ORAL_TABLET | Freq: Four times a day (QID) | ORAL | Status: DC | PRN

## 2022-10-30 MED ORDER — MORPHINE BOLUS VIA INFUSION: 1.0000 mg | INTRAVENOUS | Status: DC | PRN

## 2022-10-30 MED ORDER — POLYVINYL ALCOHOL 1.4 % OP SOLN: 1.0000 [drp] | Freq: Four times a day (QID) | OPHTHALMIC | Status: DC | PRN

## 2022-10-30 MED ORDER — ACETAMINOPHEN 650 MG RE SUPP: 650.0000 mg | Freq: Four times a day (QID) | RECTAL | Status: DC | PRN

## 2022-10-30 MED ORDER — IPRATROPIUM-ALBUTEROL 0.5-2.5 (3) MG/3ML IN SOLN
3.0000 mL | RESPIRATORY_TRACT | Status: DC
  Administered 2022-10-30: 3 mL via RESPIRATORY_TRACT
  Filled 2022-10-30: qty 3

## 2022-10-30 MED ORDER — HALOPERIDOL LACTATE 5 MG/ML IJ SOLN: 0.5000 mg | INTRAMUSCULAR | Status: DC | PRN

## 2022-10-30 MED ORDER — BIOTENE DRY MOUTH MT LIQD: 15.0000 mL | OROMUCOSAL | Status: DC | PRN

## 2022-10-30 MED ORDER — HALOPERIDOL LACTATE 2 MG/ML PO CONC: 0.5000 mg | ORAL | Status: DC | PRN

## 2022-10-30 MED ORDER — ONDANSETRON 4 MG PO TBDP: 4.0000 mg | ORAL_TABLET | Freq: Four times a day (QID) | ORAL | Status: DC | PRN

## 2022-10-30 NOTE — Progress Notes (Signed)
Dr. Hall at bedside.

## 2022-10-30 NOTE — Progress Notes (Signed)
RT took patient off Bipap due to productive cough and patient refusing to wear it. RN notified.

## 2022-10-30 NOTE — Progress Notes (Signed)
OT Cancellation Note  Patient Details Name: Latonia Conrow MRN: [id number] DOB: 1928/05/14   Cancelled Treatment:    Reason Eval/Treat Not Completed: Other (comment). OT orders received, however per MD note 10/30/22, pt with very poor prognosis and not responding to aggressive medical therapy. Family has decided to continue care under comfort measures. Spoke with RN and clarified pt status. OT will sign off  Galen Manila 10/30/2022, 12:21 PM

## 2022-10-30 NOTE — Progress Notes (Signed)
   10/30/22 0530  Vent Select  Invasive or Noninvasive Noninvasive  Adult Vent Y  Vent start date 10/30/22  Adult IBW/VT Calculations  Height 5' (1.524 m)  IBW/kg (Calculated) Female 50 kg  Low Range Vt 6cc/kg FEMALE 300 mL  Adult Moderate Range Vt 8cc/kg MA 400 mL  Adult High Range Vt 10cc/kg FEMALE 500 mL  IBW/kg (Calculated) FEMALE 45.5 kg  Low Range Vt 6cc/kg FEMALE 273 mL  Adult Moderate Range vt 8cc/kg FEMALE 364 mL  Adult High Range Vt 10cc/kg FEMALE 455 mL  Adult Ventilator Settings  Vent Type Servo-air  Vent Mode PCV (NIV)  Ve 5.7 mL  Set Rate 12 bmp  FiO2 (%) 40 %  I Time 0.9 Sec(s)  Pressure Control 10 cmH20  PEEP 6 cmH20  Adult Ventilator Measurements  Peak Airway Pressure 16 L/min  Mean Airway Pressure 8.2 cmH20  Resp Rate Spontaneous 16 br/min  Resp Rate Total 16 br/min  Exhaled Vt 427 mL  Spont TV 353 mL  Measured Ve 5.9 L  I:E Ratio Measured 1:4.0  Total PEEP 6 cmH20  SpO2 99 %  Adult Ventilator Alarms  Alarms On Y  Ve High Alarm 25 L/min  Ve Low Alarm 4 L/min  Resp Rate High Alarm 38 br/min  Resp Rate Low Alarm 10  PEEP Low Alarm 3 cmH2O  Press High Alarm 25 cmH2O  VAP Prevention  HOB> 30 Degrees Y  Equipment wiped down Yes  Breath Sounds  Bilateral Breath Sounds Coarse crackles  Vent Respiratory Assessment  Respiratory Pattern Labored;Accessory muscle use  Patient Tolerance Other (Comment) (Anxiety)   Pt placed on Servo Air NIV PC10,r12,+6,40% due to coarse crackles and increased WOB. Rapid response nurse @ bedside. MD notified. ABG pending. Duo Q4 neb tx ordered.

## 2022-10-30 NOTE — Significant Event (Addendum)
Rapid Response Event Note   Reason for Call :  Labored breathing despite breathing tx/lasix/atarax Initial Focused Assessment:  Pt lying in bed with eyes open. Her breathing is tachypneic and labored. She denies CP but does say she is SOB. Lungs with wheezes/crackles t/o. Skin warm and ry.   HR-78, BP-152/82, RR-28, SpO2-99% on 5L [place].  Interventions:  PCXR/Duoneb/lasix/atarax-done PTA RRT Bipap ABG Lasix 20 mg Plan of Care:  Pt breathing somewhat better on bipap. Give lasix, await ABG results. Continue to monitor pt. Call RRT if further assistance needed.   Event Summary:   MD Notified: Dr. Margo Aye Call Time:0502 Arrival Time:0505 End [id number]  Terrilyn Saver, RN

## 2022-10-30 NOTE — Plan of Care (Signed)
?  Problem: Education: ?Goal: Ability to demonstrate management of disease process will improve ?Outcome: Not Progressing ?Goal: Ability to verbalize understanding of medication therapies will improve ?Outcome: Not Progressing ?  ?

## 2022-10-30 NOTE — Progress Notes (Signed)
   10/30/22 0442  Charting Type  Charting Type Full Reassessment Changes Noted  Focused Reassessment No Changes Respiratory  Respiratory  Respiratory (WDL) X  Respiratory Pattern Labored;Accessory muscle use;Dyspnea with exertion;Dyspnea at rest;Orthopnea  Chest Assessment Chest expansion symmetrical  Bilateral Breath Sounds Rhonchi;Expiratory wheezes;Coarse crackles;Inspiratory wheezes  R Upper  Breath Sounds Rhonchi;Expiratory wheezes;Inspiratory wheezes  R Lower Breath Sounds Coarse crackles  L Upper Breath Sounds Rhonchi;Expiratory wheezes;Inspiratory wheezes  L Lower Breath Sounds Coarse crackles  ECG Monitoring  ECG Heart Rate 85     Dr. Margo Aye paged regarding change in respiratory status.

## 2022-10-30 NOTE — Progress Notes (Signed)
Progress Note   Patient: Janice Small GNF:[id number] DOB: [birth year] DOA: 10/15/2022     2 DOS: the patient was seen and examined on 10/30/2022   Brief hospital course: Mrs. Cara was admitted to the hospital with the working diagnosis of acute on chronic heart failure exacerbation.   89 yo female with the past medical history of diastolic heart failure, CKD , atrial fibrillation, hypertension and hypothyroidism who presented with dyspnea. Recent hospitalization 09/25/22 to 10/01/22 for upper GI bleed, due to gastric ulcers and single AVM, discharged to SNF.  At the rehab she was noted to have worsening lower extremity edema, dyspnea on exertion and fatigue. Positive orthopnea. Her furosemide was increased with no improvement of her symptoms. On her initial physical examination her blood pressure was 119/78, HR 81 and RR 19, 02 saturation 97%, lungs with rales with no wheezing, heart with S1 and S2 present and rhythmic, abdomen with no distention and positive lower extremity edema.   Patient was placed on IV furosemide and IV albumin.   Chest radiograph with right rotation, with signs of air trapping, bilateral hilar vascular congestion.   04/18 patient with worsening respiratory failure more congestion.  Decreased responsiveness, increased work of breathing. She was not able to tolerate Bipap. Follow up chest film with persistent edema.  Last night added antibiotic for possible pneumonia.   Patient with very poor prognosis, she is not responding to aggressive medical therapy. Her children at the bedside, they have decided to continue care under comfort measures.  Code status changed to DNR. Transition to full comfort care.   Assessment and Plan: * Acute on chronic diastolic CHF (congestive heart failure) Echocardiogram with preserved LV systolic function with EF 60 to 65%, RV with normal systolic function, RVSP 64.0 mmHg LA with severe dilatation, RA with severe dilatation, trivial  pericardial effusion, severe mitral valve regurgitation, severe tricuspid valve regurgitation, pulmonic valve with moderate to severe regurgitation.   Acute on chronic core pulmonale. Pulmonary hypertension.  Last night patient with worsening respiratory failure.  Her chest radiograph with signs of pulmonary edema.  She is less responsive today and her extremities are cold.  She did not tolerate full face mask bipap. Acute hypercapnic and hypoxemic respiratory failure, will add morphine drip  to control dyspnea symptoms.   Poor prognosis, patient with signs of low output heart failure. Her family has decided to continue care under comfort measures.   Atrial fibrillation Discontinue amiodarone and anticoagulation. Continue comfort care.   Acute renal failure superimposed on stage 3b chronic kidney disease Hypokalemia Patient with chronic respiratory acidosis, with metabolic alkalosis.  Plan to continue comfort support. No further blood work.   Essential hypertension Transitioned to comfort care  Hypothyroidism Continue with levothyroxine.   Malnutrition of moderate degree Comfort care.  COPD (chronic obstructive pulmonary disease) Increased secretions and respiratory distress. Continue with comfort measures.         Subjective: patient with respiratory distress, less responsive, her family is at the bedside   Physical Exam: Vitals:   10/30/22 0530 10/30/22 0728 10/30/22 0731 10/30/22 0747  BP:    (!) 150/74  Pulse: 89 82  81  Resp: 16 (!) 27  14  Temp:    97.8 F (36.6 C)  TempSrc:    Oral  SpO2: 99% 95% 99% 98%  Weight:      Height: 5' (1.524 m)      Neurology somnolent and poorly responsive to touch, not following commands or answering to questions ENT with  pallor Cardiovascular with S1 and S2 present and tachycardic Positive JVD Trace lower extremity edema, cold lower extremities  Respiratory with diffuse bilateral rhonchi and rales with no  wheezing Abdomen with no distention  Data Reviewed:    Family Communication: I spoke with patient's children at the bedside, we talked in detail about patient's condition, plan of care and prognosis and all questions were addressed.   Disposition: Status is: Inpatient Remains inpatient appropriate because: comfort care, imminent death   Planned Discharge Destination:  imminent death     Author: Coralie Keens, MD 10/30/2022 12:05 PM  For on call review [email or web address].

## 2022-10-30 NOTE — TOC Progression Note (Signed)
Transition of Care [facility]) - Progression Note    Patient Details  Name: Janice Small MRN: [id number] Date of Birth: 1928/06/17  Transition of [facility] CM/SW Contact  Leander Rams, LCSW Phone Number: 10/30/2022, 3:27 PM  Clinical Narrative:    CSW met with pt at bedside along with family present. Family notified the CSW that the did not have plans for hospice arrangements as they were anticipating a hospital death. CSW confirmed with MD that there will be no plans for hospice right now.   TOC will remain available as needed.          Expected Discharge Plan and Services                                               Social Determinants of Health (SDOH) Interventions SDOH Screenings   Food Insecurity: No Food Insecurity (10/28/2022)  Housing: Low Risk  (10/28/2022)  Transportation Needs: No Transportation Needs (10/28/2022)  Utilities: Not At Risk (10/28/2022)  Alcohol Screen: Low Risk  (08/19/2022)  Depression (PHQ2-9): Low Risk  (09/08/2022)  Financial Resource Strain: Low Risk  (08/19/2022)  Physical Activity: Inactive (08/19/2022)  Social Connections: Moderately Integrated (08/19/2022)  Stress: No Stress Concern Present (08/19/2022)  Tobacco Use: Low Risk  (11/10/2022)    Readmission Risk Interventions     No data to display         Oletta Lamas, MSW, LCSWA, LCASA Transitions of Care  Clinical Social Worker I

## 2022-10-30 NOTE — Progress Notes (Addendum)
Received a call from bedside RN regarding the patient having increase work of breathing.  CXR revealed infiltrates and increase in pulmonary vascularity suggestive of pulmonary edema, however infection could not be ruled out.  Presented at bedside.  The patient has a wet cough and ronchorous sounds on lungs exam.  Received IV Lasix earlier.  Rocephin added until active infective process could be ruled out.  The patient was placed on BIPAP due to work of breathing, an ABG is pending.   Called the patient's son x 3 to provide updates, no answer.  Time: 15 minutes  Addendum:  Was able to reach the patient's son Joey Zender via phone and provided with updates.  We discussed about the patient's code status.  The patient recently made herself Full Code during her last admission per her son, she has a signed DNR form in her living will.  The patient who is oriented x 3 asked that her 3 sons make the decision regarding her code status.   ABG resulted, PH 7.41 and PCO2 62 while on BIPAP, will repeat ABG in 2 hours.

## 2022-10-30 NOTE — Progress Notes (Signed)
   10/30/22 0410  Vitals  Temp Source Axillary  BP (!) 152/82  MAP (mmHg) 100  BP Location Right Arm  BP Method Automatic  Patient Position (if appropriate) Lying  Pulse Rate Source Monitor  ECG Heart Rate 76  Resp (!) 24  MEWS Score  MEWS Temp 0  MEWS Systolic 0  MEWS Pulse 0  MEWS RR 1  MEWS LOC 0  MEWS Score 1  MEWS Score Color Green     PRN Albuterol administered for SOB.

## 2022-10-31 ENCOUNTER — Other Ambulatory Visit: Payer: Self-pay

## 2022-11-11 ENCOUNTER — Ambulatory Visit: Payer: Medicare Other | Admitting: Cardiology

## 2022-11-12 NOTE — Death Summary Note (Signed)
DEATH SUMMARY   Patient Details  Name: Janice Small MRN: [id number] DOB: 1928-03-31 PCP:[place], Buren Kos, MD Admission/Discharge Information   Admit Date:  25-Nov-2022  Date of Death: Date of Death: 11-29-2022  Time of Death: Time of Death: 0103  Length of Stay: 3   Principle Cause of death: cardiogenic shock.    Hospital Diagnoses: Principal Problem:   Acute on chronic diastolic CHF (congestive heart failure) Active Problems:   Atrial fibrillation   Acute renal failure superimposed on stage 3b chronic kidney disease   Essential hypertension   Hypothyroidism   Malnutrition of moderate degree   COPD (chronic obstructive pulmonary disease)   Hospital Course: Janice Small was admitted to the hospital with the working diagnosis of acute on chronic heart failure exacerbation.   89 yo female with the past medical history of diastolic heart failure, CKD , atrial fibrillation, hypertension and hypothyroidism who presented with dyspnea. Recent hospitalization 09/25/22 to 10/01/22 for upper GI bleed, due to gastric ulcers and single AVM, discharged to SNF.  At the rehab she was noted to have worsening lower extremity edema, dyspnea on exertion and fatigue. Positive orthopnea. Her furosemide was increased with no improvement of her symptoms. On her initial physical examination her blood pressure was 119/78, HR 81 and RR 19, 02 saturation 97%, lungs with rales with no wheezing, heart with S1 and S2 present and rhythmic, abdomen with no distention and positive lower extremity edema.   Patient was placed on IV furosemide and IV albumin.   Chest radiograph with right rotation, with signs of air trapping, bilateral hilar vascular congestion.   04/18 patient with worsening respiratory failure more congestion.  Decreased responsiveness, increased work of breathing. She was not able to tolerate Bipap. Follow up chest film with persistent edema.  Last night added antibiotic for possible  pneumonia.   Patient with very poor prognosis, she is not responding to aggressive medical therapy. Her children at the bedside, they have decided to continue care under comfort measures.  Code status changed to DNR. Transition to full comfort care.   Assessment and Plan: * Acute on chronic diastolic CHF (congestive heart failure) Echocardiogram with preserved LV systolic function with EF 60 to 65%, RV with normal systolic function, RVSP 64.0 mmHg LA with severe dilatation, RA with severe dilatation, trivial pericardial effusion, severe mitral valve regurgitation, severe tricuspid valve regurgitation, pulmonic valve with moderate to severe regurgitation.   Acute on chronic core pulmonale. Pulmonary hypertension.  Last night patient with worsening respiratory failure.  Her chest radiograph with signs of pulmonary edema.  She is less responsive today and her extremities are cold.  She did not tolerate full face mask bipap. Acute hypercapnic and hypoxemic respiratory failure, will add morphine drip  to control dyspnea symptoms.   Poor prognosis, patient with signs of low output heart failure. Her family has decided to continue care under comfort measures.   Atrial fibrillation Discontinue amiodarone and anticoagulation. Continue comfort care.   Acute renal failure superimposed on stage 3b chronic kidney disease Hypokalemia Patient with chronic respiratory acidosis, with metabolic alkalosis.  Plan to continue comfort support. No further blood work.   Essential hypertension Transitioned to comfort care  Hypothyroidism Continue with levothyroxine.   Malnutrition of moderate degree Comfort care.  COPD (chronic obstructive pulmonary disease) Increased secretions and respiratory distress. Continue with comfort measures.          The results of significant diagnostics from this hospitalization (including imaging, microbiology, ancillary and laboratory) are  listed below for  reference.   Significant Diagnostic Studies: DG CHEST PORT 1 VIEW  Result Date: 10/29/2022 CLINICAL DATA:  Rales EXAM: PORTABLE CHEST 1 VIEW COMPARISON:  Radiograph 10/21/2022 FINDINGS: Stable cardiomediastinal silhouette which is enlarged. Aortic atherosclerotic calcification. Patchy bilateral airspace and interstitial opacities greatest in the left lower lung. Probable small bilateral pleural effusions. No pneumothorax. IMPRESSION: 1. Patchy bilateral airspace and interstitial opacities greatest in the left lower lung, favor edema though infection is not excluded. Electronically Signed   By: Minerva Fester M.D.   On: 10/29/2022 21:28   ECHOCARDIOGRAM COMPLETE  Result Date: 10/28/2022    ECHOCARDIOGRAM REPORT   Patient Name:   AALIA Small Date of Exam: 10/28/2022 Medical Rec #:  [id number]       Height:       60.0 in Accession #:    [id number]      Weight:       137.1 lb Date of Birth:  Feb 15, 1928        BSA:          1.590 m Patient Age:    89 years        BP:           147/73 mmHg Patient Gender: F               HR:           81 bpm. Exam Location:  Inpatient Procedure: 2D Echo, Cardiac Doppler and Color Doppler Indications:    acute diastolic chf  History:        Patient has prior history of Echocardiogram examinations, most                 recent 01/10/2022. CHF, chronic kidney disease, Arrythmias:Atrial                 Fibrillation, Signs/Symptoms:Edema; Risk Factors:Hypertension.  Sonographer:    Delcie Roch RDCS Referring Phys: [id number] Cecille Po MELVIN IMPRESSIONS  1. Left ventricular ejection fraction, by estimation, is 60 to 65%. The left ventricle has normal function. The left ventricle has no regional wall motion abnormalities. Left ventricular diastolic parameters are indeterminate.  2. Right ventricular systolic function is normal. The right ventricular size is normal. There is severely elevated pulmonary artery systolic pressure. The estimated right ventricular systolic pressure is  64.0 mmHg.  3. Left atrial size was severely dilated.  4. Right atrial size was severely dilated.  5. Atrial functional mitral regurgitation. The mitral valve is abnormal. Severe mitral valve regurgitation.  6. Atrial functional tricuspid regurgitation. Tricuspid valve regurgitation is severe.  7. The aortic valve is tricuspid. There is mild thickening of the aortic valve. Aortic valve regurgitation is not visualized. No aortic stenosis is present.  8. Pulmonic valve regurgitation is moderate to severe.  9. The inferior vena cava is dilated in size with >50% respiratory variability, suggesting right atrial pressure of 8 mmHg. Comparison(s): Worsening RVSP, TR, PR, and MR. FINDINGS  Left Ventricle: Left ventricular ejection fraction, by estimation, is 60 to 65%. The left ventricle has normal function. The left ventricle has no regional wall motion abnormalities. The left ventricular internal cavity size was normal in size. There is  no left ventricular hypertrophy. Left ventricular diastolic parameters are indeterminate. Right Ventricle: The right ventricular size is normal. No increase in right ventricular wall thickness. Right ventricular systolic function is normal. There is severely elevated pulmonary artery systolic pressure. The tricuspid regurgitant velocity is 3.74 m/s, and with an assumed right atrial  pressure of 8 mmHg, the estimated right ventricular systolic pressure is 64.0 mmHg. Left Atrium: Left atrial size was severely dilated. Right Atrium: Right atrial size was severely dilated. Pericardium: Trivial pericardial effusion is present. The pericardial effusion is posterior to the left ventricle. Mitral Valve: Atrial functional mitral regurgitation. The mitral valve is abnormal. Severe mitral valve regurgitation. Tricuspid Valve: Atrial functional tricuspid regurgitation. The tricuspid valve is normal in structure. Tricuspid valve regurgitation is severe. Aortic Valve: The aortic valve is tricuspid.  There is mild thickening of the aortic valve. Aortic valve regurgitation is not visualized. No aortic stenosis is present. Pulmonic Valve: The pulmonic valve was not well visualized. Pulmonic valve regurgitation is moderate to severe. No evidence of pulmonic stenosis. Aorta: The aortic root and ascending aorta are structurally normal, with no evidence of dilitation. Venous: The inferior vena cava is dilated in size with greater than 50% respiratory variability, suggesting right atrial pressure of 8 mmHg. IAS/Shunts: No atrial level shunt detected by color flow Doppler.  LEFT VENTRICLE PLAX 2D LVIDd:         4.10 cm LVIDs:         2.70 cm LV PW:         1.00 cm LV IVS:        1.10 cm LVOT diam:     2.20 cm LV SV:         58 LV SV Index:   36 LVOT Area:     3.80 cm  RIGHT VENTRICLE             IVC RV Basal diam:  2.60 cm     IVC diam: 2.20 cm RV S prime:     10.70 cm/s TAPSE (M-mode): 1.2 cm LEFT ATRIUM              Index        RIGHT ATRIUM           Index LA diam:        5.10 cm  3.21 cm/m   RA Area:     26.80 cm LA Vol (A2C):   134.0 ml 84.28 ml/m  RA Volume:   81.10 ml  51.01 ml/m LA Vol (A4C):   101.0 ml 63.52 ml/m LA Biplane Vol: 125.0 ml 78.62 ml/m  AORTIC VALVE LVOT Vmax:   82.50 cm/s LVOT Vmean:  50.900 cm/s LVOT VTI:    0.152 m  AORTA Ao Root diam: 3.20 cm Ao Asc diam:  3.50 cm MR Peak grad:   135.0 mmHg   TRICUSPID VALVE MR Mean grad:   85.0 mmHg    TR Peak grad:   56.0 mmHg MR Vmax:        581.00 cm/s  TR Vmax:        374.00 cm/s MR Vmean:       442.0 cm/s MR PISA:        0.57 cm     SHUNTS MR PISA Radius: 0.30 cm      Systemic VTI:  0.15 m                              Systemic Diam: 2.20 cm Riley Lam MD Electronically signed by Riley Lam MD Signature Date/Time: 10/28/2022/3:16:23 PM    Final    DG Chest Portable 1 View  Result Date: 10/17/2022 CLINICAL DATA:  Shortness of breath and fluid retention. EXAM: PORTABLE CHEST 1 VIEW COMPARISON:  Chest x-ray dated  September 25, 2022.  FINDINGS: The patient is rotated to the right. Unchanged cardiomegaly. New small bilateral pleural effusions. Asymmetric opacity in the right lung. No pneumothorax. No acute osseous abnormality. IMPRESSION: 1. New small bilateral pleural effusions. 2. Asymmetric opacity in the right lung could reflect asymmetric edema or atelectasis. Electronically Signed   By: Obie Dredge M.D.   On: 11/19/22 13:44    Microbiology: No results found for this or any previous visit (from the past 240 hour(s)).   Signed: Careli Luzader Annett Gula, MD

## 2022-11-12 DEATH — deceased

## 2023-03-11 ENCOUNTER — Ambulatory Visit (HOSPITAL_BASED_OUTPATIENT_CLINIC_OR_DEPARTMENT_OTHER): Payer: Medicare Other | Admitting: Family Medicine

## 2023-08-03 ENCOUNTER — Ambulatory Visit: Payer: Medicare Other | Admitting: Internal Medicine

## 2023-08-25 ENCOUNTER — Encounter (HOSPITAL_BASED_OUTPATIENT_CLINIC_OR_DEPARTMENT_OTHER): Payer: Medicare Other
